# Patient Record
Sex: Female | Born: 1985 | Race: White | Hispanic: No | Marital: Married | Smoking: Never smoker
Health system: Southern US, Community
[De-identification: ages and names within clinical notes are randomized; demographics above are authoritative.]

## PROBLEM LIST (undated history)

## (undated) DIAGNOSIS — R519 Headache, unspecified: Secondary | ICD-10-CM

## (undated) DIAGNOSIS — O99019 Anemia complicating pregnancy, unspecified trimester: Secondary | ICD-10-CM

## (undated) DIAGNOSIS — J45909 Unspecified asthma, uncomplicated: Secondary | ICD-10-CM

## (undated) DIAGNOSIS — O24419 Gestational diabetes mellitus in pregnancy, unspecified control: Secondary | ICD-10-CM

## (undated) DIAGNOSIS — O1490 Unspecified pre-eclampsia, unspecified trimester: Secondary | ICD-10-CM

## (undated) DIAGNOSIS — I10 Essential (primary) hypertension: Secondary | ICD-10-CM

## (undated) DIAGNOSIS — D62 Acute posthemorrhagic anemia: Secondary | ICD-10-CM

## (undated) DIAGNOSIS — D509 Iron deficiency anemia, unspecified: Secondary | ICD-10-CM

## (undated) DIAGNOSIS — K219 Gastro-esophageal reflux disease without esophagitis: Secondary | ICD-10-CM

## (undated) DIAGNOSIS — K909 Intestinal malabsorption, unspecified: Secondary | ICD-10-CM

## (undated) DIAGNOSIS — O9A213 Injury, poisoning and certain other consequences of external causes complicating pregnancy, third trimester: Secondary | ICD-10-CM

## (undated) DIAGNOSIS — Z8619 Personal history of other infectious and parasitic diseases: Secondary | ICD-10-CM

## (undated) DIAGNOSIS — T7840XA Allergy, unspecified, initial encounter: Secondary | ICD-10-CM

## (undated) DIAGNOSIS — R51 Headache: Secondary | ICD-10-CM

## (undated) DIAGNOSIS — N921 Excessive and frequent menstruation with irregular cycle: Secondary | ICD-10-CM

## (undated) HISTORY — DX: Excessive and frequent menstruation with irregular cycle: N92.1

## (undated) HISTORY — DX: Personal history of other infectious and parasitic diseases: Z86.19

## (undated) HISTORY — PX: ADENOIDECTOMY: SUR15

## (undated) HISTORY — DX: Unspecified pre-eclampsia, unspecified trimester: O14.90

## (undated) HISTORY — DX: Gastro-esophageal reflux disease without esophagitis: K21.9

## (undated) HISTORY — PX: KNEE SURGERY: SHX244

## (undated) HISTORY — PX: FRACTURE SURGERY: SHX138

## (undated) HISTORY — PX: CHOLECYSTECTOMY: SHX55

## (undated) HISTORY — DX: Allergy, unspecified, initial encounter: T78.40XA

## (undated) HISTORY — PX: FOOT SURGERY: SHX648

## (undated) HISTORY — PX: TONSILLECTOMY: SUR1361

## (undated) HISTORY — DX: Intestinal malabsorption, unspecified: K90.9

---

## 2014-04-01 DIAGNOSIS — S129XXA Fracture of neck, unspecified, initial encounter: Secondary | ICD-10-CM | POA: Insufficient documentation

## 2014-04-01 DIAGNOSIS — M659 Synovitis and tenosynovitis, unspecified: Secondary | ICD-10-CM | POA: Insufficient documentation

## 2014-04-01 DIAGNOSIS — IMO0002 Reserved for concepts with insufficient information to code with codable children: Secondary | ICD-10-CM | POA: Insufficient documentation

## 2014-06-07 DIAGNOSIS — S96919A Strain of unspecified muscle and tendon at ankle and foot level, unspecified foot, initial encounter: Secondary | ICD-10-CM | POA: Insufficient documentation

## 2014-06-09 DIAGNOSIS — G5790 Unspecified mononeuropathy of unspecified lower limb: Secondary | ICD-10-CM | POA: Insufficient documentation

## 2014-08-01 LAB — OB RESULTS CONSOLE HEPATITIS B SURFACE ANTIGEN: Hepatitis B Surface Ag: NEGATIVE

## 2014-08-01 LAB — OB RESULTS CONSOLE ANTIBODY SCREEN: Antibody Screen: NEGATIVE

## 2014-08-01 LAB — OB RESULTS CONSOLE ABO/RH: RH Type: POSITIVE

## 2014-08-01 LAB — OB RESULTS CONSOLE GC/CHLAMYDIA
Chlamydia: NEGATIVE
Gonorrhea: NEGATIVE

## 2014-08-01 LAB — OB RESULTS CONSOLE RUBELLA ANTIBODY, IGM: Rubella: IMMUNE

## 2014-08-01 LAB — OB RESULTS CONSOLE HIV ANTIBODY (ROUTINE TESTING): HIV: NONREACTIVE

## 2014-08-01 LAB — OB RESULTS CONSOLE RPR: RPR: NONREACTIVE

## 2014-08-11 DIAGNOSIS — Z4889 Encounter for other specified surgical aftercare: Secondary | ICD-10-CM | POA: Insufficient documentation

## 2014-11-12 ENCOUNTER — Inpatient Hospital Stay (HOSPITAL_COMMUNITY): Admission: AD | Admit: 2014-11-12 | Payer: Self-pay | Source: Ambulatory Visit | Admitting: Obstetrics

## 2015-01-04 ENCOUNTER — Ambulatory Visit: Payer: Self-pay

## 2015-02-09 ENCOUNTER — Encounter (HOSPITAL_COMMUNITY): Payer: Self-pay | Admitting: *Deleted

## 2015-02-09 ENCOUNTER — Inpatient Hospital Stay (HOSPITAL_COMMUNITY)
Admission: AD | Admit: 2015-02-09 | Discharge: 2015-02-09 | Disposition: A | Discharge status: Home / Self Care | Payer: BLUE CROSS/BLUE SHIELD | Source: Ambulatory Visit | Attending: Obstetrics & Gynecology | Admitting: Obstetrics & Gynecology

## 2015-02-09 ENCOUNTER — Inpatient Hospital Stay (HOSPITAL_COMMUNITY): Payer: BLUE CROSS/BLUE SHIELD

## 2015-02-09 DIAGNOSIS — M549 Dorsalgia, unspecified: Secondary | ICD-10-CM | POA: Diagnosis not present

## 2015-02-09 DIAGNOSIS — O9989 Other specified diseases and conditions complicating pregnancy, childbirth and the puerperium: Secondary | ICD-10-CM | POA: Insufficient documentation

## 2015-02-09 DIAGNOSIS — R109 Unspecified abdominal pain: Secondary | ICD-10-CM | POA: Insufficient documentation

## 2015-02-09 DIAGNOSIS — R51 Headache: Secondary | ICD-10-CM | POA: Insufficient documentation

## 2015-02-09 DIAGNOSIS — O9A213 Injury, poisoning and certain other consequences of external causes complicating pregnancy, third trimester: Secondary | ICD-10-CM

## 2015-02-09 HISTORY — DX: Injury, poisoning and certain other consequences of external causes complicating pregnancy, third trimester: O9A.213

## 2015-02-09 HISTORY — DX: Gestational diabetes mellitus in pregnancy, unspecified control: O24.419

## 2015-02-09 HISTORY — DX: Unspecified asthma, uncomplicated: J45.909

## 2015-02-09 LAB — COMPREHENSIVE METABOLIC PANEL
ALT: 14 U/L (ref 0–35)
AST: 20 U/L (ref 0–37)
Albumin: 2.7 g/dL — ABNORMAL LOW (ref 3.5–5.2)
Alkaline Phosphatase: 200 U/L — ABNORMAL HIGH (ref 39–117)
Anion gap: 9 (ref 5–15)
BUN: 10 mg/dL (ref 6–23)
CO2: 19 mmol/L (ref 19–32)
Calcium: 9.1 mg/dL (ref 8.4–10.5)
Chloride: 107 mmol/L (ref 96–112)
Creatinine, Ser: 0.54 mg/dL (ref 0.50–1.10)
GFR calc Af Amer: >90 mL/min (ref >90)
GFR calc non Af Amer: >90 mL/min (ref >90)
Glucose, Bld: 90 mg/dL (ref 70–99)
Potassium: 4.1 mmol/L (ref 3.5–5.1)
Sodium: 135 mmol/L (ref 135–145)
Total Bilirubin: 0.3 mg/dL (ref 0.3–1.2)
Total Protein: 6.2 g/dL (ref 6.0–8.3)

## 2015-02-09 LAB — CBC
HCT: 32.7 % — ABNORMAL LOW (ref 36.0–46.0)
Hemoglobin: 10.5 g/dL — ABNORMAL LOW (ref 12.0–15.0)
MCH: 27.9 pg (ref 26.0–34.0)
MCHC: 32.1 g/dL (ref 30.0–36.0)
MCV: 87 fL (ref 78.0–100.0)
Platelets: 271 10*3/uL (ref 150–400)
RBC: 3.76 MIL/uL — ABNORMAL LOW (ref 3.87–5.11)
RDW: 14.5 % (ref 11.5–15.5)
WBC: 13.3 10*3/uL — ABNORMAL HIGH (ref 4.0–10.5)

## 2015-02-09 LAB — PROTEIN / CREATININE RATIO, URINE
Creatinine, Urine: 22 mg/dL
Protein Creatinine Ratio: 1.82 — ABNORMAL HIGH (ref 0.00–0.15)
Total Protein, Urine: 40 mg/dL

## 2015-02-09 LAB — KLEIHAUER-BETKE STAIN
# Vials RhIg: 1
Fetal Cells %: 0 %
Quantitation Fetal Hemoglobin: 0 mL

## 2015-02-09 LAB — URIC ACID: Uric Acid, Serum: 4.4 mg/dL (ref 2.4–7.0)

## 2015-02-09 MED ORDER — ACETAMINOPHEN 500 MG PO TABS: 1000.0000 mg | ORAL_TABLET | Freq: Once | ORAL | Status: DC
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
## 2015-02-09 NOTE — Progress Notes (Signed)
Pt states she has a headache for about 1 hour that she rates 3-4
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
## 2015-02-09 NOTE — MAU Note (Signed)
Urine in lab 
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
## 2015-02-13 ENCOUNTER — Encounter (HOSPITAL_COMMUNITY): Payer: Self-pay | Admitting: *Deleted

## 2015-02-13 ENCOUNTER — Inpatient Hospital Stay (HOSPITAL_COMMUNITY)
Admission: AD | Admit: 2015-02-13 | Discharge: 2015-02-13 | Disposition: A | Discharge status: Home / Self Care | Payer: BLUE CROSS/BLUE SHIELD | Source: Ambulatory Visit | Attending: Obstetrics & Gynecology | Admitting: Obstetrics & Gynecology

## 2015-02-13 DIAGNOSIS — O24419 Gestational diabetes mellitus in pregnancy, unspecified control: Secondary | ICD-10-CM | POA: Diagnosis not present

## 2015-02-13 DIAGNOSIS — O133 Gestational [pregnancy-induced] hypertension without significant proteinuria, third trimester: Secondary | ICD-10-CM | POA: Insufficient documentation

## 2015-02-13 DIAGNOSIS — Z3A36 36 weeks gestation of pregnancy: Secondary | ICD-10-CM | POA: Diagnosis not present

## 2015-02-13 LAB — URINALYSIS, ROUTINE W REFLEX MICROSCOPIC
Bilirubin Urine: NEGATIVE
Glucose, UA: NEGATIVE mg/dL
Hgb urine dipstick: NEGATIVE
Ketones, ur: NEGATIVE mg/dL
Leukocytes, UA: NEGATIVE
Nitrite: NEGATIVE
Protein, ur: 30 mg/dL — AB
Specific Gravity, Urine: 1.015 (ref 1.005–1.030)
Urobilinogen, UA: 0.2 mg/dL (ref 0.0–1.0)
pH: 6 (ref 5.0–8.0)

## 2015-02-13 LAB — PROTEIN / CREATININE RATIO, URINE
Creatinine, Urine: 95 mg/dL
Protein Creatinine Ratio: 1.2 — ABNORMAL HIGH (ref 0.00–0.15)
Total Protein, Urine: 114 mg/dL

## 2015-02-13 LAB — COMPREHENSIVE METABOLIC PANEL
ALT: 18 U/L (ref 0–35)
AST: 21 U/L (ref 0–37)
Albumin: 2.9 g/dL — ABNORMAL LOW (ref 3.5–5.2)
Alkaline Phosphatase: 200 U/L — ABNORMAL HIGH (ref 39–117)
Anion gap: 10 (ref 5–15)
BUN: 10 mg/dL (ref 6–23)
CO2: 19 mmol/L (ref 19–32)
Calcium: 9 mg/dL (ref 8.4–10.5)
Chloride: 104 mmol/L (ref 96–112)
Creatinine, Ser: 0.63 mg/dL (ref 0.50–1.10)
GFR calc Af Amer: >90 mL/min (ref >90)
GFR calc non Af Amer: >90 mL/min (ref >90)
Glucose, Bld: 88 mg/dL (ref 70–99)
Potassium: 4.1 mmol/L (ref 3.5–5.1)
Sodium: 133 mmol/L — ABNORMAL LOW (ref 135–145)
Total Bilirubin: 0.3 mg/dL (ref 0.3–1.2)
Total Protein: 6.8 g/dL (ref 6.0–8.3)

## 2015-02-13 LAB — URIC ACID: Uric Acid, Serum: 5 mg/dL (ref 2.4–7.0)

## 2015-02-13 LAB — CBC
HCT: 31.8 % — ABNORMAL LOW (ref 36.0–46.0)
Hemoglobin: 10.5 g/dL — ABNORMAL LOW (ref 12.0–15.0)
MCH: 28.2 pg (ref 26.0–34.0)
MCHC: 33 g/dL (ref 30.0–36.0)
MCV: 85.5 fL (ref 78.0–100.0)
Platelets: 265 10*3/uL (ref 150–400)
RBC: 3.72 MIL/uL — ABNORMAL LOW (ref 3.87–5.11)
RDW: 14.6 % (ref 11.5–15.5)
WBC: 12.4 10*3/uL — ABNORMAL HIGH (ref 4.0–10.5)

## 2015-02-13 LAB — URINE MICROSCOPIC-ADD ON

## 2015-02-13 LAB — OB RESULTS CONSOLE GBS: GBS: NEGATIVE

## 2015-02-13 MED ORDER — NIFEDIPINE ER OSMOTIC RELEASE 30 MG PO TB24
30.0000 mg | ORAL_TABLET | Freq: Every day | ORAL | Status: DC
  Administered 2015-02-13: 30 mg via ORAL
  Filled 2015-02-13: qty 1

## 2015-02-13 MED ORDER — NIFEDIPINE ER 30 MG PO TB24: 30.0000 mg | ORAL_TABLET | Freq: Every day | ORAL | Status: DC

## 2015-02-13 NOTE — MAU Note (Signed)
Sent from MD office for elevated BP, 5 lb weight gain in last week.  Has HA, seeing stars @ times, denies epigastric pain.  Has had nausea her entire pregnancy.  Has some uc's, denies bleeding or LOF.

## 2015-02-13 NOTE — Discharge Instructions (Signed)
Hypertension During Pregnancy Hypertension is also called high blood pressure. Blood pressure moves blood in your body. Sometimes, the force that moves the blood becomes too strong. When you are pregnant, this condition should be watched carefully. It can cause problems for you and your baby. HOME CARE   Make and keep all of your doctor visits.  Take medicine as told by your doctor.   Eat very little salt.  Exercise regularly.  Do not take Sudafed products  Do not smoke.  Do not have drinks with caffeine.  Lie on your side when resting. GET HELP RIGHT AWAY IF:  You have bad belly (abdominal) pain.  You have sudden puffiness (swelling) in the hands, ankles, or face.  You gain 4 pounds (1.8 kilograms) or more in 1 week.  You throw up (vomit) repeatedly.  You have bleeding from the vagina.  You do not feel the baby moving as much.  You have a headache.  You have blurred or double vision.  You have muscle twitching or spasms.  You have shortness of breath.  You have blue fingernails and lips.  You have blood in your pee (urine). MAKE SURE YOU:  Understand these instructions.  Will watch your condition.  Will get help right away if you are not doing well or get worse. Document Released: 11/16/2010 Document Revised: 02/28/2014 Document Reviewed: 05/13/2013 [facility] Patient Information 2015 [place], [place]. This information is not intended to replace advice given to you by your health care provider. Make sure you discuss any questions you have with your health care provider.
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
## 2015-02-22 ENCOUNTER — Encounter (HOSPITAL_COMMUNITY): Payer: Self-pay | Admitting: *Deleted

## 2015-02-22 ENCOUNTER — Telehealth (HOSPITAL_COMMUNITY): Payer: Self-pay | Admitting: *Deleted

## 2015-02-22 NOTE — Telephone Encounter (Signed)
Preadmission screen  

## 2015-02-23 ENCOUNTER — Encounter (HOSPITAL_COMMUNITY): Payer: Self-pay

## 2015-02-23 ENCOUNTER — Inpatient Hospital Stay (HOSPITAL_COMMUNITY)
Admission: AD | Admit: 2015-02-23 | Discharge: 2015-03-02 | DRG: 765 | Disposition: A | Discharge status: Home / Self Care | Payer: BLUE CROSS/BLUE SHIELD | Source: Ambulatory Visit | Attending: Obstetrics & Gynecology | Admitting: Obstetrics & Gynecology

## 2015-02-23 DIAGNOSIS — Y92239 Unspecified place in hospital as the place of occurrence of the external cause: Secondary | ICD-10-CM | POA: Diagnosis not present

## 2015-02-23 DIAGNOSIS — Z3A37 37 weeks gestation of pregnancy: Secondary | ICD-10-CM | POA: Diagnosis present

## 2015-02-23 DIAGNOSIS — O1493 Unspecified pre-eclampsia, third trimester: Secondary | ICD-10-CM | POA: Diagnosis present

## 2015-02-23 DIAGNOSIS — J45909 Unspecified asthma, uncomplicated: Secondary | ICD-10-CM | POA: Diagnosis present

## 2015-02-23 DIAGNOSIS — O9952 Diseases of the respiratory system complicating childbirth: Secondary | ICD-10-CM | POA: Diagnosis present

## 2015-02-23 DIAGNOSIS — O9081 Anemia of the puerperium: Secondary | ICD-10-CM | POA: Diagnosis not present

## 2015-02-23 DIAGNOSIS — D62 Acute posthemorrhagic anemia: Secondary | ICD-10-CM | POA: Diagnosis not present

## 2015-02-23 DIAGNOSIS — K219 Gastro-esophageal reflux disease without esophagitis: Secondary | ICD-10-CM | POA: Diagnosis present

## 2015-02-23 DIAGNOSIS — I952 Hypotension due to drugs: Secondary | ICD-10-CM | POA: Diagnosis not present

## 2015-02-23 DIAGNOSIS — Z885 Allergy status to narcotic agent status: Secondary | ICD-10-CM | POA: Diagnosis not present

## 2015-02-23 DIAGNOSIS — Z7951 Long term (current) use of inhaled steroids: Secondary | ICD-10-CM

## 2015-02-23 DIAGNOSIS — Z79899 Other long term (current) drug therapy: Secondary | ICD-10-CM | POA: Diagnosis not present

## 2015-02-23 DIAGNOSIS — Z880 Allergy status to penicillin: Secondary | ICD-10-CM

## 2015-02-23 DIAGNOSIS — T413X5A Adverse effect of local anesthetics, initial encounter: Secondary | ICD-10-CM | POA: Diagnosis not present

## 2015-02-23 DIAGNOSIS — O1490 Unspecified pre-eclampsia, unspecified trimester: Secondary | ICD-10-CM | POA: Diagnosis present

## 2015-02-23 DIAGNOSIS — O9A213 Injury, poisoning and certain other consequences of external causes complicating pregnancy, third trimester: Secondary | ICD-10-CM

## 2015-02-23 DIAGNOSIS — O24420 Gestational diabetes mellitus in childbirth, diet controlled: Secondary | ICD-10-CM | POA: Diagnosis present

## 2015-02-23 DIAGNOSIS — O9962 Diseases of the digestive system complicating childbirth: Secondary | ICD-10-CM | POA: Diagnosis present

## 2015-02-23 DIAGNOSIS — O1404 Mild to moderate pre-eclampsia, complicating childbirth: Secondary | ICD-10-CM | POA: Diagnosis present

## 2015-02-23 HISTORY — DX: Headache: R51

## 2015-02-23 HISTORY — DX: Headache, unspecified: R51.9

## 2015-02-23 HISTORY — DX: Essential (primary) hypertension: I10

## 2015-02-23 LAB — CBC
HCT: 31.3 % — ABNORMAL LOW (ref 36.0–46.0)
Hemoglobin: 10.2 g/dL — ABNORMAL LOW (ref 12.0–15.0)
MCH: 27.6 pg (ref 26.0–34.0)
MCHC: 32.6 g/dL (ref 30.0–36.0)
MCV: 84.6 fL (ref 78.0–100.0)
Platelets: 269 10*3/uL (ref 150–400)
RBC: 3.7 MIL/uL — ABNORMAL LOW (ref 3.87–5.11)
RDW: 15.1 % (ref 11.5–15.5)
WBC: 12.4 10*3/uL — ABNORMAL HIGH (ref 4.0–10.5)

## 2015-02-23 LAB — PROTEIN / CREATININE RATIO, URINE
Creatinine, Urine: 102 mg/dL
Protein Creatinine Ratio: 1.7 mg/mg{Cre} — ABNORMAL HIGH (ref 0.00–0.15)
Total Protein, Urine: 173 mg/dL

## 2015-02-23 LAB — COMPREHENSIVE METABOLIC PANEL
ALT: 14 U/L (ref 0–35)
AST: 21 U/L (ref 0–37)
Albumin: 2.9 g/dL — ABNORMAL LOW (ref 3.5–5.2)
Alkaline Phosphatase: 210 U/L — ABNORMAL HIGH (ref 39–117)
Anion gap: 8 (ref 5–15)
BUN: 12 mg/dL (ref 6–23)
CO2: 19 mmol/L (ref 19–32)
Calcium: 9 mg/dL (ref 8.4–10.5)
Chloride: 107 mmol/L (ref 96–112)
Creatinine, Ser: 0.73 mg/dL (ref 0.50–1.10)
GFR calc Af Amer: >90 mL/min (ref >90)
GFR calc non Af Amer: >90 mL/min (ref >90)
Glucose, Bld: 110 mg/dL — ABNORMAL HIGH (ref 70–99)
Potassium: 4.1 mmol/L (ref 3.5–5.1)
Sodium: 134 mmol/L — ABNORMAL LOW (ref 135–145)
Total Bilirubin: 0.2 mg/dL — ABNORMAL LOW (ref 0.3–1.2)
Total Protein: 6.6 g/dL (ref 6.0–8.3)

## 2015-02-23 LAB — URINALYSIS, ROUTINE W REFLEX MICROSCOPIC
Bilirubin Urine: NEGATIVE
Glucose, UA: NEGATIVE mg/dL
Ketones, ur: NEGATIVE mg/dL
Leukocytes, UA: NEGATIVE
Nitrite: NEGATIVE
Protein, ur: 100 mg/dL — AB
Specific Gravity, Urine: 1.02 (ref 1.005–1.030)
Urobilinogen, UA: 0.2 mg/dL (ref 0.0–1.0)
pH: 6 (ref 5.0–8.0)

## 2015-02-23 LAB — URIC ACID: Uric Acid, Serum: 4.7 mg/dL (ref 2.4–7.0)

## 2015-02-23 LAB — URINE MICROSCOPIC-ADD ON: WBC, UA: NONE SEEN WBC/hpf (ref <3)

## 2015-02-23 MED ORDER — OXYTOCIN BOLUS FROM INFUSION: 500.0000 mL | INTRAVENOUS | Status: DC

## 2015-02-23 MED ORDER — OXYCODONE-ACETAMINOPHEN 5-325 MG PO TABS: 1.0000 | ORAL_TABLET | ORAL | Status: DC | PRN

## 2015-02-23 MED ORDER — BUDESONIDE 180 MCG/ACT IN AEPB
2.0000 | INHALATION_SPRAY | Freq: Two times a day (BID) | RESPIRATORY_TRACT | Status: DC
  Administered 2015-02-25: 4 via RESPIRATORY_TRACT

## 2015-02-23 MED ORDER — MAGNESIUM SULFATE 50 % IJ SOLN
2.0000 g/h | INTRAVENOUS | Status: DC
  Administered 2015-02-24 – 2015-02-26 (×3): 2 g/h via INTRAVENOUS
  Filled 2015-02-23 (×4): qty 80

## 2015-02-23 MED ORDER — OXYTOCIN 40 UNITS IN LACTATED RINGERS INFUSION - SIMPLE MED: 62.5000 mL/h | INTRAVENOUS | Status: DC

## 2015-02-23 MED ORDER — LIDOCAINE HCL (PF) 1 % IJ SOLN: 30.0000 mL | INTRAMUSCULAR | Status: DC | PRN

## 2015-02-23 MED ORDER — PANTOPRAZOLE SODIUM 20 MG PO TBEC
20.0000 mg | DELAYED_RELEASE_TABLET | Freq: Every day | ORAL | Status: DC
  Administered 2015-02-24: 20 mg via ORAL
  Filled 2015-02-23 (×3): qty 1

## 2015-02-23 MED ORDER — ACETAMINOPHEN 325 MG PO TABS
650.0000 mg | ORAL_TABLET | ORAL | Status: DC | PRN
  Administered 2015-02-24 (×2): 650 mg via ORAL
  Filled 2015-02-23 (×2): qty 2

## 2015-02-23 MED ORDER — FLUTICASONE PROPIONATE HFA 44 MCG/ACT IN AERO
2.0000 | INHALATION_SPRAY | Freq: Two times a day (BID) | RESPIRATORY_TRACT | Status: DC
  Filled 2015-02-23: qty 10.6

## 2015-02-23 MED ORDER — TERBUTALINE SULFATE 1 MG/ML IJ SOLN: 0.2500 mg | Freq: Once | INTRAMUSCULAR | Status: AC | PRN

## 2015-02-23 MED ORDER — LACTATED RINGERS IV SOLN
INTRAVENOUS | Status: DC
  Administered 2015-02-23 – 2015-02-25 (×4): via INTRAVENOUS

## 2015-02-23 MED ORDER — OXYTOCIN 10 UNIT/ML IJ SOLN: 10.0000 [IU] | Freq: Once | INTRAMUSCULAR | Status: AC | PRN

## 2015-02-23 MED ORDER — ZOLPIDEM TARTRATE 5 MG PO TABS
5.0000 mg | ORAL_TABLET | Freq: Every evening | ORAL | Status: DC | PRN
  Administered 2015-02-24: 5 mg via ORAL
  Filled 2015-02-23: qty 1

## 2015-02-23 MED ORDER — LACTATED RINGERS IV SOLN
500.0000 mL | INTRAVENOUS | Status: DC | PRN
  Administered 2015-02-24 – 2015-02-25 (×2): 1000 mL via INTRAVENOUS

## 2015-02-23 MED ORDER — LABETALOL HCL 100 MG PO TABS
100.0000 mg | ORAL_TABLET | Freq: Two times a day (BID) | ORAL | Status: DC
  Administered 2015-02-23 – 2015-02-24 (×2): 100 mg via ORAL
  Filled 2015-02-23 (×6): qty 1

## 2015-02-23 MED ORDER — MAGNESIUM SULFATE BOLUS VIA INFUSION
4.0000 g | Freq: Once | INTRAVENOUS | Status: AC
  Administered 2015-02-23: 4 g via INTRAVENOUS
  Filled 2015-02-23: qty 500

## 2015-02-23 MED ORDER — MISOPROSTOL 25 MCG QUARTER TABLET
25.0000 ug | ORAL_TABLET | ORAL | Status: DC | PRN
Start: 2015-02-23 — End: 2015-02-25
  Administered 2015-02-23 – 2015-02-24 (×2): 25 ug via VAGINAL
  Filled 2015-02-23 (×2): qty 0.25

## 2015-02-23 MED ORDER — OXYCODONE-ACETAMINOPHEN 5-325 MG PO TABS: 2.0000 | ORAL_TABLET | ORAL | Status: DC | PRN

## 2015-02-23 MED ORDER — CITRIC ACID-SODIUM CITRATE 334-500 MG/5ML PO SOLN
30.0000 mL | ORAL | Status: DC | PRN
  Administered 2015-02-25: 30 mL via ORAL
  Filled 2015-02-23 (×2): qty 15
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
## 2015-02-23 NOTE — MAU Note (Signed)
Patient called to lab directly from being registered.

## 2015-02-23 NOTE — MAU Note (Signed)
Pt here to evaluate BP, have blood work and urine collected. Denies any bleeding or leaking of fluid

## 2015-02-24 ENCOUNTER — Inpatient Hospital Stay (HOSPITAL_COMMUNITY): Payer: BLUE CROSS/BLUE SHIELD | Admitting: Anesthesiology

## 2015-02-24 ENCOUNTER — Encounter (HOSPITAL_COMMUNITY): Payer: Self-pay | Admitting: *Deleted

## 2015-02-24 LAB — GLUCOSE, CAPILLARY
Glucose-Capillary: 109 mg/dL — ABNORMAL HIGH (ref 70–99)
Glucose-Capillary: 112 mg/dL — ABNORMAL HIGH (ref 70–99)
Glucose-Capillary: 113 mg/dL — ABNORMAL HIGH (ref 70–99)
Glucose-Capillary: 127 mg/dL — ABNORMAL HIGH (ref 70–99)
Glucose-Capillary: 94 mg/dL (ref 70–99)
Glucose-Capillary: 95 mg/dL (ref 70–99)

## 2015-02-24 LAB — CBC
HCT: 31 % — ABNORMAL LOW (ref 36.0–46.0)
HCT: 32.4 % — ABNORMAL LOW (ref 36.0–46.0)
Hemoglobin: 10 g/dL — ABNORMAL LOW (ref 12.0–15.0)
Hemoglobin: 10.7 g/dL — ABNORMAL LOW (ref 12.0–15.0)
MCH: 27 pg (ref 26.0–34.0)
MCH: 27.8 pg (ref 26.0–34.0)
MCHC: 32.3 g/dL (ref 30.0–36.0)
MCHC: 33 g/dL (ref 30.0–36.0)
MCV: 83.8 fL (ref 78.0–100.0)
MCV: 84.2 fL (ref 78.0–100.0)
Platelets: 254 10*3/uL (ref 150–400)
Platelets: 265 10*3/uL (ref 150–400)
RBC: 3.7 MIL/uL — ABNORMAL LOW (ref 3.87–5.11)
RBC: 3.85 MIL/uL — ABNORMAL LOW (ref 3.87–5.11)
RDW: 15.1 % (ref 11.5–15.5)
RDW: 15.2 % (ref 11.5–15.5)
WBC: 12.5 10*3/uL — ABNORMAL HIGH (ref 4.0–10.5)
WBC: 16.7 10*3/uL — ABNORMAL HIGH (ref 4.0–10.5)

## 2015-02-24 LAB — TYPE AND SCREEN
ABO/RH(D): O POS
Antibody Screen: NEGATIVE

## 2015-02-24 LAB — ABO/RH: ABO/RH(D): O POS

## 2015-02-24 LAB — MAGNESIUM
Magnesium: 4.2 mg/dL — ABNORMAL HIGH (ref 1.5–2.5)
Magnesium: 4.4 mg/dL — ABNORMAL HIGH (ref 1.5–2.5)

## 2015-02-24 LAB — RPR: RPR Ser Ql: NONREACTIVE

## 2015-02-24 MED ORDER — BUPIVACAINE HCL (PF) 0.25 % IJ SOLN
INTRAMUSCULAR | Status: DC | PRN
  Administered 2015-02-24 (×2): 4 mL via EPIDURAL

## 2015-02-24 MED ORDER — PHENYLEPHRINE 40 MCG/ML (10ML) SYRINGE FOR IV PUSH (FOR BLOOD PRESSURE SUPPORT)
PREFILLED_SYRINGE | INTRAVENOUS | Status: AC
  Administered 2015-02-24: 80 ug
  Filled 2015-02-24: qty 20

## 2015-02-24 MED ORDER — DIPHENHYDRAMINE HCL 50 MG/ML IJ SOLN: 12.5000 mg | INTRAMUSCULAR | Status: DC | PRN

## 2015-02-24 MED ORDER — OXYTOCIN 40 UNITS IN LACTATED RINGERS INFUSION - SIMPLE MED
1.0000 m[IU]/min | INTRAVENOUS | Status: DC
  Administered 2015-02-24: 2 m[IU]/min via INTRAVENOUS
  Administered 2015-02-25: 30 m[IU]/min via INTRAVENOUS
  Administered 2015-02-25: 22 m[IU]/min via INTRAVENOUS
  Filled 2015-02-24 (×2): qty 1000

## 2015-02-24 MED ORDER — EPHEDRINE 5 MG/ML INJ
10.0000 mg | INTRAVENOUS | Status: AC | PRN
  Administered 2015-02-24 (×3): 10 mg via INTRAVENOUS
  Filled 2015-02-24: qty 4

## 2015-02-24 MED ORDER — LIDOCAINE-EPINEPHRINE (PF) 2 %-1:200000 IJ SOLN
INTRAMUSCULAR | Status: DC | PRN
  Administered 2015-02-24: 3 mL

## 2015-02-24 MED ORDER — EPHEDRINE 5 MG/ML INJ
INTRAVENOUS | Status: AC
  Administered 2015-02-24: 10 mg
  Filled 2015-02-24: qty 8

## 2015-02-24 MED ORDER — BUTORPHANOL TARTRATE 1 MG/ML IJ SOLN
1.0000 mg | Freq: Once | INTRAMUSCULAR | Status: AC
Start: 2015-02-24 — End: 2015-02-24
  Administered 2015-02-24: 1 mg via INTRAVENOUS
  Filled 2015-02-24: qty 1

## 2015-02-24 MED ORDER — FENTANYL 2.5 MCG/ML BUPIVACAINE 1/10 % EPIDURAL INFUSION (WH - ANES)
14.0000 mL/h | INTRAMUSCULAR | Status: DC | PRN
  Administered 2015-02-24: 12 mL/h via EPIDURAL
  Administered 2015-02-25 (×2): 14 mL/h via EPIDURAL
  Filled 2015-02-24 (×4): qty 125

## 2015-02-24 MED ORDER — PHENYLEPHRINE 40 MCG/ML (10ML) SYRINGE FOR IV PUSH (FOR BLOOD PRESSURE SUPPORT)
80.0000 ug | PREFILLED_SYRINGE | INTRAVENOUS | Status: AC | PRN
  Administered 2015-02-24 (×3): 80 ug via INTRAVENOUS
  Filled 2015-02-24: qty 20

## 2015-02-24 NOTE — Progress Notes (Signed)
Subjective:   Some cramping, HA improved after Stadol. No visual disturbances or epigastric pain.  Objective:   VS: Blood pressure 110/71, pulse 78, temperature 98.5 F (36.9 C), temperature source Oral, resp. rate 20, height 5\' 3"  (1.6 m), weight 94.348 kg (208 lb), SpO2 98 %. Heart: RRR Lungs: CTA bilat Extremities: 1+ BLE edema, DTRs 2+, no clonus FHR: baseline 120 / variability mod / accelerations + / no decelerations Toco: contractions every 3-5 minutes Cervix: deferred Cervical balloon in place-traction placed and re-taped to leg Membranes: intact Pitocin: 2 mu/min Magnesium Sulfate: 2gm/hr  Assessment:  37.[redacted] weeks gestation PEC Labor: latent FHR category I  Plan:  Bps stabile, continue cervical ripening, analgesia/anesthesia prn, continue Labetalol and Mag Sulfate.     Donette LarryBHAMBRI, , N MSN, CNM 02/24/2015, 9:30 AM

## 2015-02-24 NOTE — Anesthesia Preprocedure Evaluation (Addendum)
Anesthesia Evaluation  Patient identified by MRN, date of birth, ID band Patient awake    Reviewed: Allergy & Precautions, NPO status , Patient's Chart, lab work & pertinent test results  History of Anesthesia Complications Negative for: history of anesthetic complications  Airway Mallampati: III  TM Distance: >3 FB Neck ROM: Full    Dental  (+) Teeth Intact   Pulmonary asthma ,  breath sounds clear to auscultation        Cardiovascular hypertension, Pt. on medications Rhythm:Regular     Neuro/Psych  Headaches,    GI/Hepatic negative GI ROS, Neg liver ROS,   Endo/Other  diabetesMorbid obesity  Renal/GU      Musculoskeletal   Abdominal   Peds  Hematology  (+) anemia ,   Anesthesia Other Findings   Reproductive/Obstetrics (+) Pregnancy                            Anesthesia Physical Anesthesia Plan  ASA: III  Anesthesia Plan: Epidural   Post-op Pain Management:    Induction:   Airway Management Planned:   Additional Equipment:   Intra-op Plan:   Post-operative Plan:   Informed Consent: I have reviewed the patients History and Physical, chart, labs and discussed the procedure including the risks, benefits and alternatives for the proposed anesthesia with the patient or authorized representative who has indicated his/her understanding and acceptance.     Plan Discussed with: CRNA and Surgeon  Anesthesia Plan Comments: (Going for C/S. Has had some breakthrough pain that was responsive to epidural bolusing.)       Anesthesia Quick Evaluation

## 2015-02-24 NOTE — Progress Notes (Signed)
Subjective:   Some cramping, some ctx uncomfortable, declines pain meds  Objective:   VS: Blood pressure 144/87, pulse 86, temperature 98.5 F (36.9 C), temperature source Oral, resp. rate 18, height 5\' 3"  (1.6 m), weight 94.348 kg (208 lb), SpO2 98 %.  BS: 127  FHR: baseline 120 / variability mod / accelerations + / no decelerations Toco: contractions every 2-6 minutes Cervix: 5/70/-2, cervical balloon out Membranes: intact Pitocin: 8 mu/min  Assessment:  Labor: latent FHR category I PEC-BPs stable A1GDM-stable  Plan:  Continue Pitocin and Mag Sulfate, BS checks q4, analgesia/anesthesia prn, consider AROM if no active labor in 3-4 hrs, anticipate SVD.      Donette LarryBHAMBRI, , N MSN, CNM 02/24/2015, 12:16 PM

## 2015-02-24 NOTE — Anesthesia Procedure Notes (Signed)
Epidural Patient location during procedure: OB  Staffing Anesthesiologist: , CHRIS Performed by: anesthesiologist   Preanesthetic Checklist Completed: patient identified, surgical consent, pre-op evaluation, timeout performed, IV checked, risks and benefits discussed and monitors and equipment checked  Epidural Patient position: sitting Prep: site prepped and draped and DuraPrep Patient monitoring: heart rate, cardiac monitor, continuous pulse ox and blood pressure Approach: midline Location: L3-L4 Injection technique: LOR saline  Needle:  Needle type: Tuohy  Needle gauge: 17 G Needle length: 9 cm Needle insertion depth: 8 cm Catheter type: closed end flexible Catheter size: 19 Gauge Catheter at skin depth: 13 cm Test dose: negative and 2% lidocaine with Epi 1:200 K  Assessment Events: blood not aspirated, injection not painful, no injection resistance, negative IV test and no paresthesia  Additional Notes H+P and labs checked, risks and benefits discussed with the patient, consent obtained, procedure tolerated well and without complications.  Reason for block:procedure for pain   

## 2015-02-24 NOTE — Progress Notes (Signed)
Subjective:   Called by RN d/t hypotension after epidural. Pt feeling nausea and "not well", vomited x1. Left lateral w/peanut ball. Denies pain.  Objective:   VS: BP range: 70-100/30-50 FHR: baseline 150 / variability minimal / accelerations no / late decelerations Toco: contractions every 4-5 minutes Cervix: Dilation: 5 Effacement (%): 70 Station: -3 Exam by:: Fabian NovemberM , CNM Membranes: clear Pitocin: 20 mu/min Mag Sulfate 2gm/hr  Assessment:  Labor: latent Post epidural hypotension FHR category II then progression to Cat I PEC  Plan:  MgSO4 and Pitocin d/c'd, IVF bolus and multiple doses of Ephedrine and Neosynephrine, o2 by NRB, reduction of epidural rate by anesthesia-BP and fetal tracing improved, pt feeling slightly better. IUPC placed. Restart Pitocin after 1 hr if tracing reassuring. Restart Mag Sulfate in 2 hrs if BP stable.  Dr. Juliene PinaMody updated with A/P.    Donette LarryBHAMBRI, , N MSN, CNM 02/24/2015, 8:50 PM

## 2015-02-24 NOTE — Progress Notes (Signed)
On call MD Induction for preeclampsia S: c/o headache Denies blurry vision S/p cytotec x 2  O: Blood pressure 127/71, pulse 86, temperature 98.5 F (36.9 C), temperature source Oral, resp. rate 18, height 5\' 3"  (1.6 m), weight 94.348 kg (208 lb), SpO2 98 %.  VE ft/80/-3 very soft LUS. Intracervical balloon placed  tracing: baseline 140"s (+) accel 155 irreg ctx  CBC Latest Ref Rng 02/23/2015 02/13/2015 02/09/2015  WBC 4.0 - 10.5 K/uL 12.4(H) 12.4(H) 13.3(H)  Hemoglobin 12.0 - 15.0 g/dL 10.2(L) 10.5(L) 10.5(L)  Hematocrit 36.0 - 46.0 % 31.3(L) 31.8(L) 32.7(L)  Platelets 150 - 400 K/uL 269 265 271   CMP Latest Ref Rng 02/23/2015 02/13/2015 02/09/2015  Glucose 70 - 99 mg/dL 098(J110(H) 88 90  BUN 6 - 23 mg/dL 12 10 10   Creatinine 0.50 - 1.10 mg/dL 1.910.73 4.780.63 2.950.54  Sodium 135 - 145 mmol/L 134(L) 133(L) 135  Potassium 3.5 - 5.1 mmol/L 4.1 4.1 4.1  Chloride 96 - 112 mmol/L 107 104 107  CO2 19 - 32 mmol/L 19 19 19   Calcium 8.4 - 10.5 mg/dL 9.0 9.0 9.1  Total Protein 6.0 - 8.3 g/dL 6.6 6.8 6.2  Total Bilirubin 0.3 - 1.2 mg/dL 6.2(Z0.2(L) 0.3 0.3  Alkaline Phos 39 - 117 U/L 210(H) 200(H) 200(H)  AST 0 - 37 U/L 21 21 20   ALT 0 - 35 U/L 14 18 14    CBG 112 IMP: Preclampsia On magnesium. Labetalol IUP @ 37 6/7 weeks P) pitocin after 7 am. Repeat CBC, mg level now. May have a light labor meal. Reviewed plan with pt and rolitta, CNM

## 2015-02-24 NOTE — Progress Notes (Signed)
Subjective:   Some ctx uncomfortable, declines need for pain med. Right lateral.  Objective:   VS: Blood pressure 148/88, pulse 114, temperature 98.5 F (36.9 C), temperature source Oral, resp. rate 18, height 5\' 3"  (1.6 m), weight 94.348 kg (208 lb), SpO2 98 %. I/O: adequate BS: 112 FHR: baseline 125 / variability mod / accelerations + / no decelerations Toco: contractions every 3-4 minutes Cervix: Dilation: 5 Effacement (%): 70 Station: -3 Exam by:: Fabian NovemberM , CNM Membranes: AROM-clear, pink tinged, scant Pitocin: 18 mu/min  Assessment:  Labor: latent PEC-stable A1GDM-stable FHR category I  Plan:  Continue Pitocin titration, peanut ball to facilitate rotation and descent, analgesia prn. Dr. Juliene PinaMody updated with A/P.      Donette LarryBHAMBRI, , N MSN, CNM 02/24/2015, 5:10 PM

## 2015-02-25 ENCOUNTER — Encounter (HOSPITAL_COMMUNITY)
Admission: AD | Disposition: A | Discharge status: Home / Self Care | Payer: Self-pay | Source: Ambulatory Visit | Attending: Obstetrics & Gynecology

## 2015-02-25 ENCOUNTER — Inpatient Hospital Stay (HOSPITAL_COMMUNITY): Admission: RE | Admit: 2015-02-25 | Payer: No Typology Code available for payment source | Source: Ambulatory Visit

## 2015-02-25 ENCOUNTER — Encounter (HOSPITAL_COMMUNITY): Payer: Self-pay | Admitting: Registered Nurse

## 2015-02-25 DIAGNOSIS — O1404 Mild to moderate pre-eclampsia, complicating childbirth: Secondary | ICD-10-CM | POA: Diagnosis present

## 2015-02-25 LAB — GLUCOSE, CAPILLARY
Glucose-Capillary: 107 mg/dL — ABNORMAL HIGH (ref 70–99)
Glucose-Capillary: 143 mg/dL — ABNORMAL HIGH (ref 70–99)
Glucose-Capillary: 124 mg/dL — ABNORMAL HIGH (ref 70–99)
Glucose-Capillary: 126 mg/dL — ABNORMAL HIGH (ref 70–99)
Glucose-Capillary: 122 mg/dL — ABNORMAL HIGH (ref 70–99)
Glucose-Capillary: 113 mg/dL — ABNORMAL HIGH (ref 70–99)

## 2015-02-25 LAB — COMPREHENSIVE METABOLIC PANEL
ALT: 15 U/L (ref 0–35)
AST: 22 U/L (ref 0–37)
Albumin: 2.6 g/dL — ABNORMAL LOW (ref 3.5–5.2)
Alkaline Phosphatase: 205 U/L — ABNORMAL HIGH (ref 39–117)
Anion gap: 11 (ref 5–15)
BUN: 9 mg/dL (ref 6–23)
CO2: 19 mmol/L (ref 19–32)
Calcium: 7.5 mg/dL — ABNORMAL LOW (ref 8.4–10.5)
Chloride: 102 mmol/L (ref 96–112)
Creatinine, Ser: 0.93 mg/dL (ref 0.50–1.10)
GFR calc Af Amer: >90 mL/min (ref >90)
GFR calc non Af Amer: 82 mL/min — ABNORMAL LOW (ref >90)
Glucose, Bld: 132 mg/dL — ABNORMAL HIGH (ref 70–99)
Potassium: 4.2 mmol/L (ref 3.5–5.1)
Sodium: 132 mmol/L — ABNORMAL LOW (ref 135–145)
Total Bilirubin: 0.5 mg/dL (ref 0.3–1.2)
Total Protein: 6.1 g/dL (ref 6.0–8.3)

## 2015-02-25 LAB — CBC
HCT: 29.8 % — ABNORMAL LOW (ref 36.0–46.0)
Hemoglobin: 9.6 g/dL — ABNORMAL LOW (ref 12.0–15.0)
MCH: 27.4 pg (ref 26.0–34.0)
MCHC: 32.2 g/dL (ref 30.0–36.0)
MCV: 84.9 fL (ref 78.0–100.0)
Platelets: 243 10*3/uL (ref 150–400)
RBC: 3.51 MIL/uL — ABNORMAL LOW (ref 3.87–5.11)
RDW: 15.5 % (ref 11.5–15.5)
WBC: 20.2 10*3/uL — ABNORMAL HIGH (ref 4.0–10.5)

## 2015-02-25 SURGERY — Surgical Case
Anesthesia: Epidural | Site: Abdomen

## 2015-02-25 MED ORDER — ALBUTEROL SULFATE (2.5 MG/3ML) 0.083% IN NEBU: 3.0000 mL | INHALATION_SOLUTION | Freq: Four times a day (QID) | RESPIRATORY_TRACT | Status: DC | PRN

## 2015-02-25 MED ORDER — KETOROLAC TROMETHAMINE 30 MG/ML IJ SOLN: 30.0000 mg | Freq: Four times a day (QID) | INTRAMUSCULAR | Status: DC | PRN

## 2015-02-25 MED ORDER — SENNOSIDES-DOCUSATE SODIUM 8.6-50 MG PO TABS
2.0000 | ORAL_TABLET | ORAL | Status: DC
  Administered 2015-02-25 – 2015-03-01 (×4): 2 via ORAL
  Filled 2015-02-25 (×4): qty 2

## 2015-02-25 MED ORDER — WITCH HAZEL-GLYCERIN EX PADS: 1.0000 "application " | MEDICATED_PAD | CUTANEOUS | Status: DC | PRN

## 2015-02-25 MED ORDER — KETOROLAC TROMETHAMINE 30 MG/ML IJ SOLN
30.0000 mg | Freq: Once | INTRAMUSCULAR | Status: DC | PRN
Start: 2015-02-25 — End: 2015-02-25

## 2015-02-25 MED ORDER — MISOPROSTOL 200 MCG PO TABS
ORAL_TABLET | ORAL | Status: AC
  Filled 2015-02-25: qty 2

## 2015-02-25 MED ORDER — PANTOPRAZOLE SODIUM 20 MG PO TBEC
20.0000 mg | DELAYED_RELEASE_TABLET | Freq: Every day | ORAL | Status: DC
  Administered 2015-02-25 – 2015-03-01 (×5): 20 mg via ORAL
  Filled 2015-02-25 (×6): qty 1

## 2015-02-25 MED ORDER — SCOPOLAMINE 1 MG/3DAYS TD PT72: 1.0000 | MEDICATED_PATCH | Freq: Once | TRANSDERMAL | Status: DC

## 2015-02-25 MED ORDER — MORPHINE SULFATE 0.5 MG/ML IJ SOLN
INTRAMUSCULAR | Status: AC
  Filled 2015-02-25: qty 10

## 2015-02-25 MED ORDER — MORPHINE SULFATE (PF) 0.5 MG/ML IJ SOLN
INTRAMUSCULAR | Status: DC | PRN
  Administered 2015-02-25: 3 mg via EPIDURAL

## 2015-02-25 MED ORDER — SODIUM BICARBONATE 8.4 % IV SOLN
INTRAVENOUS | Status: AC
  Filled 2015-02-25: qty 50

## 2015-02-25 MED ORDER — ONDANSETRON HCL 4 MG/2ML IJ SOLN
INTRAMUSCULAR | Status: AC
  Filled 2015-02-25: qty 2

## 2015-02-25 MED ORDER — ONDANSETRON HCL 4 MG/2ML IJ SOLN: 4.0000 mg | Freq: Three times a day (TID) | INTRAMUSCULAR | Status: DC | PRN

## 2015-02-25 MED ORDER — TETANUS-DIPHTH-ACELL PERTUSSIS 5-2.5-18.5 LF-MCG/0.5 IM SUSP
0.5000 mL | Freq: Once | INTRAMUSCULAR | Status: DC
  Filled 2015-02-25: qty 0.5

## 2015-02-25 MED ORDER — SCOPOLAMINE 1 MG/3DAYS TD PT72
MEDICATED_PATCH | TRANSDERMAL | Status: AC
  Filled 2015-02-25: qty 1

## 2015-02-25 MED ORDER — LACTATED RINGERS IV SOLN
INTRAVENOUS | Status: DC | PRN
  Administered 2015-02-25 (×2): via INTRAVENOUS

## 2015-02-25 MED ORDER — SODIUM BICARBONATE 8.4 % IV SOLN
INTRAVENOUS | Status: DC | PRN
  Administered 2015-02-25 (×5): 5 mL via EPIDURAL

## 2015-02-25 MED ORDER — OXYTOCIN 10 UNIT/ML IJ SOLN
40.0000 [IU] | INTRAMUSCULAR | Status: DC | PRN
  Administered 2015-02-25: 40 [IU] via INTRAVENOUS

## 2015-02-25 MED ORDER — OXYTOCIN 40 UNITS IN LACTATED RINGERS INFUSION - SIMPLE MED: 62.5000 mL/h | INTRAVENOUS | Status: AC

## 2015-02-25 MED ORDER — PRENATAL MULTIVITAMIN CH: 1.0000 | ORAL_TABLET | Freq: Every day | ORAL | Status: DC

## 2015-02-25 MED ORDER — DEXTROSE 5 % IV SOLN
INTRAVENOUS | Status: DC
  Administered 2015-02-25: 114.75 mL via INTRAVENOUS

## 2015-02-25 MED ORDER — MEPERIDINE HCL 25 MG/ML IJ SOLN
INTRAMUSCULAR | Status: AC
  Filled 2015-02-25: qty 1

## 2015-02-25 MED ORDER — PRENATAL MULTIVITAMIN CH
1.0000 | ORAL_TABLET | Freq: Every day | ORAL | Status: DC
  Administered 2015-02-26 – 2015-03-01 (×4): 1 via ORAL
  Filled 2015-02-25 (×4): qty 1

## 2015-02-25 MED ORDER — SIMETHICONE 80 MG PO CHEW
80.0000 mg | CHEWABLE_TABLET | ORAL | Status: DC
  Administered 2015-02-25 – 2015-02-27 (×2): 80 mg via ORAL
  Filled 2015-02-25 (×3): qty 1

## 2015-02-25 MED ORDER — NALOXONE HCL 0.4 MG/ML IJ SOLN: 0.4000 mg | INTRAMUSCULAR | Status: DC | PRN

## 2015-02-25 MED ORDER — IBUPROFEN 600 MG PO TABS
600.0000 mg | ORAL_TABLET | Freq: Four times a day (QID) | ORAL | Status: DC
  Administered 2015-02-25 – 2015-03-02 (×19): 600 mg via ORAL
  Filled 2015-02-25 (×19): qty 1

## 2015-02-25 MED ORDER — SCOPOLAMINE 1 MG/3DAYS TD PT72
MEDICATED_PATCH | TRANSDERMAL | Status: DC | PRN
  Administered 2015-02-25: 1 via TRANSDERMAL

## 2015-02-25 MED ORDER — DIPHENHYDRAMINE HCL 25 MG PO CAPS: 25.0000 mg | ORAL_CAPSULE | Freq: Four times a day (QID) | ORAL | Status: DC | PRN

## 2015-02-25 MED ORDER — PHENYLEPHRINE 40 MCG/ML (10ML) SYRINGE FOR IV PUSH (FOR BLOOD PRESSURE SUPPORT)
PREFILLED_SYRINGE | INTRAVENOUS | Status: AC
  Filled 2015-02-25: qty 10

## 2015-02-25 MED ORDER — ONDANSETRON HCL 4 MG/2ML IJ SOLN
4.0000 mg | Freq: Four times a day (QID) | INTRAMUSCULAR | Status: DC | PRN
  Administered 2015-02-25 (×2): 4 mg via INTRAVENOUS
  Filled 2015-02-25 (×2): qty 2

## 2015-02-25 MED ORDER — MEPERIDINE HCL 25 MG/ML IJ SOLN
INTRAMUSCULAR | Status: DC | PRN
Start: 2015-02-25 — End: 2015-02-25
  Administered 2015-02-25: 25 mg via INTRAVENOUS

## 2015-02-25 MED ORDER — MISOPROSTOL 25 MCG QUARTER TABLET
ORAL_TABLET | ORAL | Status: DC | PRN
  Administered 2015-02-25: 400 ug via ORAL

## 2015-02-25 MED ORDER — FLUTICASONE PROPIONATE HFA 44 MCG/ACT IN AERO
2.0000 | INHALATION_SPRAY | Freq: Two times a day (BID) | RESPIRATORY_TRACT | Status: DC
  Administered 2015-02-25 – 2015-03-01 (×7): 2 via RESPIRATORY_TRACT
  Filled 2015-02-25 (×3): qty 10.6

## 2015-02-25 MED ORDER — MEPERIDINE HCL 25 MG/ML IJ SOLN: 6.2500 mg | INTRAMUSCULAR | Status: DC | PRN

## 2015-02-25 MED ORDER — ACETAMINOPHEN 325 MG PO TABS: 650.0000 mg | ORAL_TABLET | ORAL | Status: DC | PRN

## 2015-02-25 MED ORDER — PROMETHAZINE HCL 25 MG/ML IJ SOLN: 6.2500 mg | INTRAMUSCULAR | Status: DC | PRN

## 2015-02-25 MED ORDER — ERYTHROMYCIN 5 MG/GM OP OINT
TOPICAL_OINTMENT | OPHTHALMIC | Status: AC
  Filled 2015-02-25: qty 1

## 2015-02-25 MED ORDER — DIBUCAINE 1 % RE OINT: 1.0000 "application " | TOPICAL_OINTMENT | RECTAL | Status: DC | PRN

## 2015-02-25 MED ORDER — LIDOCAINE-EPINEPHRINE (PF) 2 %-1:200000 IJ SOLN
INTRAMUSCULAR | Status: AC
  Filled 2015-02-25: qty 20

## 2015-02-25 MED ORDER — IBUPROFEN 600 MG PO TABS: 600.0000 mg | ORAL_TABLET | Freq: Four times a day (QID) | ORAL | Status: DC | PRN

## 2015-02-25 MED ORDER — OXYTOCIN 10 UNIT/ML IJ SOLN
INTRAMUSCULAR | Status: AC
  Filled 2015-02-25: qty 4

## 2015-02-25 MED ORDER — GENTAMICIN SULFATE 40 MG/ML IJ SOLN
Freq: Once | INTRAVENOUS | Status: DC
  Filled 2015-02-25: qty 8.75

## 2015-02-25 MED ORDER — FENTANYL CITRATE (PF) 100 MCG/2ML IJ SOLN: 25.0000 ug | INTRAMUSCULAR | Status: DC | PRN

## 2015-02-25 MED ORDER — SIMETHICONE 80 MG PO CHEW
80.0000 mg | CHEWABLE_TABLET | ORAL | Status: DC | PRN
  Administered 2015-02-26: 80 mg via ORAL

## 2015-02-25 MED ORDER — MENTHOL 3 MG MT LOZG: 1.0000 | LOZENGE | OROMUCOSAL | Status: DC | PRN

## 2015-02-25 MED ORDER — LANOLIN HYDROUS EX OINT: 1.0000 "application " | TOPICAL_OINTMENT | CUTANEOUS | Status: DC | PRN

## 2015-02-25 MED ORDER — NALOXONE HCL 1 MG/ML IJ SOLN: 1.0000 ug/kg/h | INTRAMUSCULAR | Status: DC | PRN

## 2015-02-25 MED ORDER — SIMETHICONE 80 MG PO CHEW
80.0000 mg | CHEWABLE_TABLET | Freq: Three times a day (TID) | ORAL | Status: DC
  Administered 2015-02-26 – 2015-03-02 (×11): 80 mg via ORAL
  Filled 2015-02-25 (×11): qty 1

## 2015-02-25 MED ORDER — VITAMIN K1 1 MG/0.5ML IJ SOLN
INTRAMUSCULAR | Status: AC
  Filled 2015-02-25: qty 0.5

## 2015-02-25 MED ORDER — LORATADINE 10 MG PO TABS
10.0000 mg | ORAL_TABLET | Freq: Every day | ORAL | Status: DC
  Administered 2015-02-25 – 2015-03-02 (×6): 10 mg via ORAL
  Filled 2015-02-25 (×7): qty 1

## 2015-02-25 MED ORDER — FLUTICASONE PROPIONATE 50 MCG/ACT NA SUSP
1.0000 | Freq: Every day | NASAL | Status: DC
  Administered 2015-02-26 – 2015-03-01 (×4): 1 via NASAL
  Filled 2015-02-25: qty 16

## 2015-02-25 MED ORDER — ONDANSETRON HCL 4 MG/2ML IJ SOLN
INTRAMUSCULAR | Status: DC | PRN
  Administered 2015-02-25: 4 mg via INTRAVENOUS

## 2015-02-25 MED ORDER — LACTATED RINGERS IV SOLN
INTRAVENOUS | Status: DC
  Administered 2015-02-25 – 2015-02-26 (×2): via INTRAVENOUS

## 2015-02-25 MED ORDER — SODIUM CHLORIDE 0.9 % IJ SOLN: 3.0000 mL | INTRAMUSCULAR | Status: DC | PRN

## 2015-02-25 MED ORDER — LACTATED RINGERS IV SOLN
INTRAVENOUS | Status: DC | PRN
  Administered 2015-02-25: 14:00:00 via INTRAVENOUS

## 2015-02-25 MED ORDER — LABETALOL HCL 100 MG PO TABS
100.0000 mg | ORAL_TABLET | Freq: Once | ORAL | Status: AC
  Administered 2015-02-25: 100 mg via ORAL
  Filled 2015-02-25: qty 1

## 2015-02-25 SURGICAL SUPPLY — 36 items
BENZOIN TINCTURE PRP APPL 2/3 (GAUZE/BANDAGES/DRESSINGS) ×2 IMPLANT
CLAMP CORD UMBIL (MISCELLANEOUS) IMPLANT
CLOTH BEACON ORANGE TIMEOUT ST (SAFETY) ×2 IMPLANT
CONTAINER PREFILL 10% NBF 15ML (MISCELLANEOUS) IMPLANT
DRAPE SHEET LG 3/4 BI-LAMINATE (DRAPES) IMPLANT
DRSG OPSITE POSTOP 4X10 (GAUZE/BANDAGES/DRESSINGS) ×2 IMPLANT
DURAPREP 26ML APPLICATOR (WOUND CARE) ×2 IMPLANT
ELECT REM PT RETURN 9FT ADLT (ELECTROSURGICAL) ×2
ELECTRODE REM PT RTRN 9FT ADLT (ELECTROSURGICAL) ×1 IMPLANT
EXTRACTOR VACUUM KIWI (MISCELLANEOUS) IMPLANT
EXTRACTOR VACUUM M CUP 4 TUBE (SUCTIONS) IMPLANT
GLOVE BIO SURGEON STRL SZ7 (GLOVE) ×2 IMPLANT
GLOVE BIOGEL PI IND STRL 7.0 (GLOVE) ×1 IMPLANT
GLOVE BIOGEL PI INDICATOR 7.0 (GLOVE) ×1
GOWN STRL REUS W/TWL LRG LVL3 (GOWN DISPOSABLE) ×4 IMPLANT
KIT ABG SYR 3ML LUER SLIP (SYRINGE) IMPLANT
NEEDLE HYPO 25X5/8 SAFETYGLIDE (NEEDLE) IMPLANT
NS IRRIG 1000ML POUR BTL (IV SOLUTION) ×2 IMPLANT
PACK C SECTION WH (CUSTOM PROCEDURE TRAY) ×2 IMPLANT
PAD OB MATERNITY 4.3X12.25 (PERSONAL CARE ITEMS) ×2 IMPLANT
RTRCTR C-SECT PINK 25CM LRG (MISCELLANEOUS) IMPLANT
STAPLER VISISTAT 35W (STAPLE) IMPLANT
STRIP CLOSURE SKIN 1/2X4 (GAUZE/BANDAGES/DRESSINGS) ×2 IMPLANT
SUT MON AB-0 CT1 36 (SUTURE) ×6 IMPLANT
SUT PLAIN 0 NONE (SUTURE) IMPLANT
SUT PLAIN 2 0 (SUTURE)
SUT PLAIN ABS 2-0 CT1 27XMFL (SUTURE) IMPLANT
SUT PROLENE 1 CT (SUTURE) ×2 IMPLANT
SUT VIC AB 0 CT1 27 (SUTURE) ×2
SUT VIC AB 0 CT1 27XBRD ANBCTR (SUTURE) ×2 IMPLANT
SUT VIC AB 2-0 CT1 27 (SUTURE) ×2
SUT VIC AB 2-0 CT1 TAPERPNT 27 (SUTURE) ×2 IMPLANT
SUT VIC AB 4-0 KS 27 (SUTURE) ×2 IMPLANT
SUT VICRYL 0 TIES 12 18 (SUTURE) IMPLANT
TOWEL OR 17X24 6PK STRL BLUE (TOWEL DISPOSABLE) ×2 IMPLANT
TRAY FOLEY CATH SILVER 14FR (SET/KITS/TRAYS/PACK) IMPLANT

## 2015-02-25 NOTE — Op Note (Signed)
Cesarean Section Procedure Note   Karla Huber Sortino  02/23/2015 - 02/25/2015  Indications: 38.0 wks, Preeclampsia, A1GDM, labor induction. Arrest of active labor/ protracted labor, faiilure to decent, occiput transverse, large caput and moulding but no decent   Pre-operative Diagnosis: Arrest of labor and descent. Preeclampsia, A1GDM.   Post-operative Diagnosis: Same   Surgeon:  Shea EvansVaishali , MD   Assistants: Raelyn Moraolitta Dawson, CNM  Anesthesia: epidural   Procedure Details:  The patient was seen in the Labor Room. She was induced due to Preeclampsia. She had Cytotec to start induction, followed by foley bulb in cervix that fell out at 5 cm and had protracted latent phase as well as active phase with arrest of rotation and decent, moulding and caput getting larger without decent, so decision was made to proceed with C-section. The risks, benefits, complications, treatment options, and expected outcomes were discussed with the patient. The patient concurred with the proposed plan, giving informed consent. [name] and the procedure verified as C-Section Delivery.  She was brought to the Operating Room. A Time Out was held and the above information confirmed. Gentamicin and Clindamycin given per protocol. After induction of epidural anesthesia, the patient was draped and prepped in the usual sterile manner. A Pfannenstiel Incision was made and carried down through the subcutaneous tissue to the fascia. Fascial incision was made and extended transversely. The fascia was separated from the underlying rectus tissue superiorly and inferiorly. The peritoneum was identified and entered. Peritoneal incision was extended longitudinally carefully since bladder was noted higher and edematous. Alexis retractor placed carefully. The utero-vesical peritoneal reflection was incised transversely and the bladder flap was bluntly freed from the lower uterine segment. A low transverse uterine incision  was made. Amniotic fluid was clear. Baby was in LOT position and with long caput. Delivered from cephalic presentation with rotation and flexion was a healthy Female infant at 14.01 hours with Apgar scores of 8 at one minute and 9 at five minutes. Umbilical cord was clamped and cut cord blood was obtained for evaluation, cord ph was not sent.  Baby was handed to NICU team. The placenta was removed Intact and appeared normal. The uterine outline, tubes and ovaries appeared normal. Uterus was cleaned of debris. The uterine incision was closed with running locked sutures of 0Monocryl in 2 layers.  Hemostasis was observed. Retractor removed. Parietal peritoneum closed with 2-0 Vicryl. Pyramidalis muscles approximated in midline. The fascia was then reapproximated with running sutures of 0Vicryl. The subcuticular closure was performed using 2-0plain gut. The skin was closed with 4-0Vicryl. Steristrips and dressing placed.   Instrument, sponge, and needle counts were correct prior the abdominal closure and were correct at the conclusion of the case.   Findings: Female infant delivered cephalic from LOT position via Kerr hysterotomy at 14.01 hours on 02/25/15 with Apgars 8 and 9. Weight 7 lbs. No extensions. Normal uterus, tubes, ovaries. Placenta intact, to path.    Estimated Blood Loss: 700 cc  Total IV Fluids: 2400 ml LR  Urine Output: 100CC OF clear urine  Specimens: Placenta to path. Cord blood.  Complications: no complications  Disposition: PACU - hemodynamically stable.   Maternal Condition: stable   Baby condition / location:  Couplet care / Skin to Skin  Attending Attestation: I performed the procedure.   Signed: Surgeon(s): Shea EvansVaishali , MD

## 2015-02-25 NOTE — Progress Notes (Signed)
Subjective:   Feeling pressure in lower abd. Recent SVE by RN.  Objective:   VS: Blood pressure 149/86, pulse 126, temperature 97.6 F (36.4 C), temperature source Oral, resp. rate 18, height 5\' 3"  (1.6 m), weight 94.348 kg (208 lb), SpO2 100 %. I/O: adequate FHR: baseline 145 / variability mod / accelerations + / no decelerations Toco: contractions every 2-3 minutes MVU: 155 Cervix: Dilation: 5 Effacement (%): 70 Station: -2 Exam by:: ansah-mensah, rnc  Membranes: clear Pitocin: 20 mu/min MgSO4: 2gm/hr  Assessment:  Labor: latent FHR category I PEC-stable  Plan:  Give po Labetalol now (held earlier), continue Pitocin titration to achieve adequate labor, epidural bolus prn, lateral positions with peanut ball, await active labor.      Donette LarryBHAMBRI, , N MSN, CNM 02/25/2015, 1:37 AM

## 2015-02-25 NOTE — Transfer of Care (Signed)
Immediate Anesthesia Transfer of Care Note  Patient: Karla BeetsRebekah Huber  Procedure(s) Performed: Procedure(s): CESAREAN SECTION (N/A)  Patient Location: PACU  Anesthesia Type:Epidural  Level of Consciousness: awake, alert  and oriented  Airway & Oxygen Therapy: Patient Spontanous Breathing  Post-op Assessment: Report given to RN  Post vital signs: Reviewed  Last Vitals:  Filed Vitals:   02/25/15 1302  BP: 129/79  Pulse: 104  Temp: 37.4 C  Resp:     Complications: No apparent anesthesia complications

## 2015-02-25 NOTE — Progress Notes (Signed)
Patient ID: Karla BeetsRebekah Huber, female   DOB: [birth year], 29 y.o.   MRN: [id number] S: Very tired, nauseated, pain well-controlled with an epidural. (+) pelvic pressure.   Ceasar Mons: Filed Vitals:   02/25/15 1132 02/25/15 1202 02/25/15 1232 02/25/15 1302  BP: 126/71 122/75 110/46 129/79  Pulse: 90 95 83 104  Temp:    99.3 F (37.4 C)  TempSrc:    Oral  Resp: 16 16    Height:      Weight:      SpO2:         FHT:  FHR: 140 bpm, variability: minimal,  accelerations:  Present,  decelerations:  Absent UC:   regular, every 2-4 minutes SVE:   Dilation: 8 Effacement (%): 90 Station: -1, caput Exam by:: R  CNM   A / P: Arrest in active phase of labor  Arrest of dilation GDM A1  Fetal Wellbeing:  Category II Pain Control:  Epidural  Anticipated MOD:  Cesarean Delivery - see Dr. Camillia HerterMody's note for explanation of risks of surgical delivery  [name], , M MSN, CNM 02/25/2015, 12:55 PM

## 2015-02-25 NOTE — Anesthesia Postprocedure Evaluation (Signed)
Anesthesia Post Note  Patient: Karla BeetsRebekah Huber  Procedure(s) Performed: Procedure(s) (LRB): CESAREAN SECTION (N/A)  Anesthesia type: Epidural  Patient location: PACU  Post pain: Pain level controlled  Post assessment: Post-op Vital signs reviewed  Last Vitals:  Filed Vitals:   02/25/15 1700  BP:   Pulse: 108  Temp: 38.1 C  Resp: 21    Post vital signs: Reviewed  Level of consciousness: awake  Complications: No apparent anesthesia complications

## 2015-02-25 NOTE — Progress Notes (Signed)
Patient ID: Karla BeetsRebekah Huber, female   DOB: [birth year], 29 y.o.   MRN: [id number] S: Fatigued, pain somewhat controlled with epidural, (+) pelvic pressure with every contraction that is causing more discomfort.  In RT lateral position with peanut ball - requesting to sit up for a while.  Requesting to freshen up, since no shower in a while.  O: Filed Vitals:   02/25/15 0630 02/25/15 0700 02/25/15 0730 02/25/15 0800  BP: 143/74 111/50 112/57 123/73  Pulse: 97 91 98 96  Temp:    98.6 F (37 C)  TempSrc:    Oral  Resp:  17 16 16   Height:      Weight:      SpO2:         FHT:  FHR: 140 bpm, variability: minimal ,  accelerations:  Present,  decelerations:  Absent UC:   regular, every 1.5-5 minutes SVE:   Not done   A / P: Induction of labor due to preeclampsia,  progressing well on pitocin  Fetal Wellbeing:  Category I Pain Control:  Epidural  Recheck cervix at 0945  Anticipated MOD:  Cautious for NSVD / discussed with patient and spouse that with last VE the fetal head had some molding; which indicates the baby is trying to fit through pelvic openings / if there is no cervical change and/or fetal descent, will consult with Dr. Juliene PinaMody on cesarean delivery / understanding verbalized  Kenard GowerDAWSON, , M MSN, CNM 02/25/2015, 8:38 AM

## 2015-02-25 NOTE — Progress Notes (Signed)
Patient ID: Karla BeetsRebekah Critzer, female   DOB: 06-24-1986, 29 y.o.   MRN: [id number] S: Feeling very tired, pain well-controlled with epidural. Less pelvic pressure since additional dosing of epidural by CRNA.   O: Filed Vitals:   02/25/15 0800 02/25/15 0830 02/25/15 0900 02/25/15 0930  BP: 123/73 132/84 140/88 124/59  Pulse: 96 100 97 100  Temp: 98.6 F (37 C)     TempSrc: Oral     Resp: 16 16 16 16   Height:      Weight:      SpO2:         FHT:  FHR: 130 bpm, variability: minimal ,  accelerations:  Absent,  decelerations:  Absent UC:   regular, every 2-6 minutes / MVUs inadequate at 120 on maximum pitocin dose SVE:   Dilation: 7 Effacement (%): 90 Station: -1 with caput at 0 Exam by: Carloyn Jaeger. , CNM   A / P: Induction of labor due to preeclampsia,  Slow progression on pitocin  Fetal Wellbeing:  Category II Pain Control:  Epidural  Anticipated MOD:  Cautiously proceeding with NSVD  Reassess in 2 hours   *Dr. Juliene PinaMody updated on labor progression - agrees with plan  Kenard GowerAWSON, , M MSN, CNM 02/25/2015, 10:48 AM

## 2015-02-25 NOTE — Progress Notes (Signed)
Subjective:   Comfortable with epidural, feeling pressure on sacrum  Objective:   VS: Blood pressure 110/55, pulse 99, temperature 98.6 F (37 C), temperature source Oral, resp. rate 18, height 5\' 3"  (1.6 m), weight 94.348 kg (208 lb), SpO2 100 %. BS: 143-had juice and popsicle FHR: baseline 140 / variability mod / accelerations + / no decelerations Toco: contractions every 1-4 minutes Cervix: 6/80/-2, +caput Membranes: clear Pitocin: 20 mu/min MVU: 165  Assessment:  Labor: early active FHR category I PEC-stable A1GDM-stable  Plan:  Exaggerated Sims with peanutball, continue Pitocin and MgSO4, epidural bolus prn, anticipate labor progression. Dr. Juliene PinaMody updated with A/P.     Donette LarryBHAMBRI, , N MSN, CNM 02/25/2015, 3:53 AM

## 2015-02-25 NOTE — Progress Notes (Signed)
Juanetta BeetsRebekah Taitano is a 29 y.o. G1P0 at 3547w0d, IOL for preeclampsia.   Objective: BP 122/75 mmHg  Pulse 95  Temp(Src) 98.5 F (36.9 C) (Oral)  Resp 16  Ht 5\' 3"  (1.6 m)  Wt 208 lb (94.348 kg)  BMI 36.85 kg/m2  SpO2 100% I/O last 3 completed shifts: In: 6374.5 [P.O.:1445; I.V.:4929.5] Out: 4415 [Urine:4015; Emesis/NG output:400] Total I/O In: 1220 [P.O.:240; I.V.:980] Out: 245 [Urine:245]  FHT:  FHR: 130 bpm, variability: moderate,  accelerations:  Present,  decelerations:  Absent UC:   regular, every 3 minutes SVE:   Dilation: 7 Effacement (%): 90 Station: -1 Exam by:: R Dawson CNM Arrest of labor, protracted dilatation and arrest of decent   Labs: Lab Results  Component Value Date   WBC 20.2* 02/25/2015   HGB 9.6* 02/25/2015   HCT 29.8* 02/25/2015   MCV 84.9 02/25/2015   PLT 243 02/25/2015    Assessment / Plan: Arrest in active phase of labor. Proceed with C/section delivery Preeclampsia:  on magnesium sulfate, no signs or symptoms of toxicity and labs stable Fetal Wellbeing:  Category I Pain Control:  Epidural I/D:  n/a Anticipated MOD:  C/section   Risks/complications of surgery reviewed incl infection, bleeding, damage to internal organs including bladder, bowels, ureters, blood vessels, other risks from anesthesia, VTE and delayed complications of any surgery, complications in future surgery reviewed. Also discussed neonatal complications incl difficult delivery, laceration, vacuum assistance, TTN etc. Pt understands and agrees, all concerns addressed.    , R 02/25/2015, 12:55 PM

## 2015-02-25 NOTE — Progress Notes (Signed)
Subjective:   Comfortable with epidural, lateral with peanut ball.  Objective:   VS: Blood pressure 112/57, pulse 98, temperature 98.4 F (36.9 C), temperature source Oral, resp. rate 16, height 5\' 3"  (1.6 m), weight 94.348 kg (208 lb), SpO2 100 %. FHR: baseline 135 / variability mod / accelerations + / no decelerations Toco: contractions every 2-3 minutes Cervix: 6/90/-2, ++molding Membranes: clear Pitocin: 30 mu/min MVU: 160  Assessment:  Labor: protracted active FHR category I PEC-stable A1GDM-stable  Plan:  No progress or descent, continue repositioning with peanutball, continue current Pitocin rate, recheck in 2 hrs, guarded for VD. Dr. Juliene PinaMody updated with A/P.     Donette LarryBHAMBRI, , N MSN, CNM 02/25/2015, 7:56 AM

## 2015-02-26 ENCOUNTER — Encounter (HOSPITAL_COMMUNITY): Payer: Self-pay | Admitting: Obstetrics and Gynecology

## 2015-02-26 LAB — COMPREHENSIVE METABOLIC PANEL
ALT: 14 U/L (ref 14–54)
AST: 23 U/L (ref 15–41)
Albumin: 2.1 g/dL — ABNORMAL LOW (ref 3.5–5.0)
Alkaline Phosphatase: 172 U/L — ABNORMAL HIGH (ref 38–126)
Anion gap: 9 (ref 5–15)
BUN: 10 mg/dL (ref 6–20)
CO2: 23 mmol/L (ref 22–32)
Calcium: 7 mg/dL — ABNORMAL LOW (ref 8.9–10.3)
Chloride: 100 mmol/L — ABNORMAL LOW (ref 101–111)
Creatinine, Ser: 0.86 mg/dL (ref 0.44–1.00)
GFR calc Af Amer: >60 mL/min (ref >60)
GFR calc non Af Amer: >60 mL/min (ref >60)
Glucose, Bld: 99 mg/dL (ref 70–99)
Potassium: 4.3 mmol/L (ref 3.5–5.1)
Sodium: 132 mmol/L — ABNORMAL LOW (ref 135–145)
Total Bilirubin: 0.1 mg/dL — ABNORMAL LOW (ref 0.3–1.2)
Total Protein: 4.9 g/dL — ABNORMAL LOW (ref 6.5–8.1)

## 2015-02-26 LAB — CBC
HCT: 27.9 % — ABNORMAL LOW (ref 36.0–46.0)
Hemoglobin: 9 g/dL — ABNORMAL LOW (ref 12.0–15.0)
MCH: 27.4 pg (ref 26.0–34.0)
MCHC: 32.3 g/dL (ref 30.0–36.0)
MCV: 84.8 fL (ref 78.0–100.0)
Platelets: 218 10*3/uL (ref 150–400)
RBC: 3.29 MIL/uL — ABNORMAL LOW (ref 3.87–5.11)
RDW: 15.6 % — ABNORMAL HIGH (ref 11.5–15.5)
WBC: 15.5 10*3/uL — ABNORMAL HIGH (ref 4.0–10.5)

## 2015-02-26 MED ORDER — ACETAMINOPHEN-CODEINE #3 300-30 MG PO TABS
1.0000 | ORAL_TABLET | ORAL | Status: DC | PRN
  Administered 2015-02-27 – 2015-03-01 (×5): 1 via ORAL
  Filled 2015-02-26 (×5): qty 1

## 2015-02-26 MED ORDER — POLYSACCHARIDE IRON COMPLEX 150 MG PO CAPS
150.0000 mg | ORAL_CAPSULE | Freq: Every day | ORAL | Status: DC
  Administered 2015-02-26 – 2015-02-27 (×2): 150 mg via ORAL
  Filled 2015-02-26 (×3): qty 1

## 2015-02-26 NOTE — Lactation Note (Signed)
This note was copied from the chart of Karla Huber [place]. Lactation Consultation Note  Patient Name: Karla Huber WUJWJ'XToday's Date: 02/26/2015 Reason for consult: Initial assessment   Initial consult at 3527 hours old; P1 mom in AICU on Mag.  GA 38.0; BW 7 lbs, 0.9 oz.  LS-8 by RN.  RN reports hearing swallows. Infant has breastfed x8 (10-15 min) + attempts x2 + EBM x1 (5 ml); voids-7 since birth; stools-6 since birth. RN reports mom is having difficulty latching infant to right side; mom states infant get frustrated on right side but states that breastfeeding is going well with no complaints. Visitors in room at time of visit.  [facility] denied [facility] at [facility]   RN stated mom has a scab appearing place on right nipple at top; comfort gels given to RN for patient to use.  LC unable to assess at this time.   Educated on feeding cues, size of infant's stomach, and cluster feeding. Mom has Medela PIS DEBP at home she has already received from insurance company.   Lactation brochure given and informed of outpatient services and hospital support group. Encouraged mom to call for assistance as needed.    Maternal Data Formula [facility] Ex[facility]:  [facility] Reason for exclusion: Admission to Intensive Care Unit (ICU) post-partum Has patient been taught Hand Expression?:  (visitors in room at time of visit)  Feeding Feeding Type: Breast Fed Length of feed: 0 min  LATCH Score/Interventions                      Lactation Tools Discussed/Used WIC Program: No   Consult Status Consult Status: Follow-up Date: 02/27/15 Follow-up type: In-patient    Lendon KaVann,  Walker 02/26/2015, 5:37 PM

## 2015-02-26 NOTE — Progress Notes (Signed)
Post Partum Day #1 (4/30, 2.01 pm C/section, female), HD #3.1/2. Preeclampsia, A1GDM  Subjective: Came back to see pt this afternoon since she was sleeping this morning. Per FOB, she was doing very well and VS were stable, she had started to diurese well.   Saw pt at 3.30 pm. Feels well. Tolerated regular diet. Ambulated since foley was removed and magnesium discontinued at 2 pm. NO HA/SOB/CP/SOB/ vision problem.   Objective: BP 128/71 mmHg  Pulse 83  Temp(Src) 98.6 F (37 C) (Oral)  Resp 18  Ht 5\' 3"  (1.6 m)  Wt 214 lb 0.4 oz (97.081 kg)  BMI 37.92 kg/m2  SpO2 98%  Breastfeeding? Unknown Temp:  [97.8 F (36.6 C)-99.8 F (37.7 C)] 98.6 F (37 C) (05/01 1708) Pulse Rate:  [74-111] 83 (05/01 1708) Resp:  [18-20] 18 (05/01 1708) BP: (117-157)/(67-90) 128/71 mmHg (05/01 1708) SpO2:  [90 %-98 %] 98 % (05/01 1708) Weight:  [211 lb 6.4 oz (95.89 kg)-214 lb 0.4 oz (97.081 kg)] 214 lb 0.4 oz (97.081 kg) (05/01 0603)   I/O last 3 completed shifts: In: 8732.4 [P.O.:1670; I.V.:7062.4] Out: 3860 [Urine:2860; Emesis/NG output:300; Blood:700] Total I/O In: 720 [P.O.:720] Out: 2250 [Urine:2250]  Physical Exam:  General: alert and cooperative  Lungs CTA bilateral CV RRR Lochia: appropriate Uterine Fundus: firm Abdomen soft, non tender, normal bowel sounds  Incision: Dressing dry DVT Evaluation: No evidence of DVT seen on physical exam. Edema reduced. DTR +2/+2  CBC Latest Ref Rng 02/26/2015 02/25/2015 02/24/2015  WBC 4.0 - 10.5 K/uL 15.5(H) 20.2(H) 16.7(H)  Hemoglobin 12.0 - 15.0 g/dL 9.0(L) 9.6(L) 10.7(L)  Hematocrit 36.0 - 46.0 % 27.9(L) 29.8(L) 32.4(L)  Platelets 150 - 400 K/uL 218 243 265   CMP Latest Ref Rng 02/26/2015 02/25/2015 02/23/2015  Glucose 70 - 99 mg/dL 99 161(W132(H) 960(A110(H)  BUN 6 - 20 mg/dL 10 9 12   Creatinine 0.44 - 1.00 mg/dL 5.400.86 9.810.93 1.910.73  Sodium 135 - 145 mmol/L 132(L) 132(L) 134(L)  Potassium 3.5 - 5.1 mmol/L 4.3 4.2 4.1  Chloride 101 - 111 mmol/L 100(L) 102 107   CO2 22 - 32 mmol/L 23 19 19   Calcium 8.9 - 10.3 mg/dL 7.0(L) 7.5(L) 9.0  Total Protein 6.5 - 8.1 g/dL 4.9(L) 6.1 6.6  Total Bilirubin 0.3 - 1.2 mg/dL 4.7(W0.1(L) 0.5 2.9(F0.2(L)  Alkaline Phos 38 - 126 U/L 172(H) 205(H) 210(H)  AST 15 - 41 U/L 23 22 21   ALT 14 - 54 U/L 14 15 14     Assessment/Plan: 1) Postpartum C/s day 1. Girl. Breast feeding. C/s - uneventful, slight anemia, Iron from tomorrow, d/w pt. Pain well controlled, needs T#3 (cannot take Percocet or Vicodin) 2) Preeclampsia  Stable BPs, labs and magnesium x24 hrs. D/c'ed at 2 pm, will transfer to floor at 6 pm is remained stable. Precautions and warning s/s reviewed.  3) GHTN- no anti-HTN meds at present. She is doing well, continue BP checks 4) GDM -A1 - no need to check now. Recheck at 6 wks at home and bring log to office, 5) Postpartum- CANNOT take TDAP due to high fever after last shot fer years back, so patient declined in pregnancy and now. She was advised Pneumovax due to Asthma hx, but declined due to same fear.  6) Asthma - stable, s/p treatment yesterday in PACU.   , R 02/26/2015, 9:20 AM

## 2015-02-26 NOTE — Anesthesia Postprocedure Evaluation (Signed)
  Anesthesia Post-op Note  Patient: Karla BeetsRebekah Huber  Procedure(s) Performed: Procedure(s): CESAREAN SECTION (N/A)  Patient Location: A-ICU  Anesthesia Type:Epidural  Level of Consciousness: awake, alert , oriented and patient cooperative  Airway and Oxygen Therapy: Patient Spontanous Breathing  Post-op Pain: none  Post-op Assessment: Post-op Vital signs reviewed, Patient's Cardiovascular Status Stable, Respiratory Function Stable, Patent Airway, No headache, No backache, No residual numbness and No residual motor weakness  Post-op Vital Signs: Reviewed and stable  Last Vitals:  Filed Vitals:   02/26/15 0600  BP: 127/80  Pulse: 87  Temp:   Resp: 20    Complications: No apparent anesthesia complications

## 2015-02-26 NOTE — Addendum Note (Signed)
Addendum  created 02/26/15 [id number] by Yolonda KidaAlison L , CRNA   Modules edited: Notes Section   Notes Section:  File: [id number]

## 2015-02-27 ENCOUNTER — Encounter (HOSPITAL_COMMUNITY): Payer: Self-pay | Admitting: Obstetrics & Gynecology

## 2015-02-27 LAB — COMPREHENSIVE METABOLIC PANEL
ALT: 20 U/L (ref 14–54)
AST: 23 U/L (ref 15–41)
Albumin: 2.2 g/dL — ABNORMAL LOW (ref 3.5–5.0)
Alkaline Phosphatase: 142 U/L — ABNORMAL HIGH (ref 38–126)
Anion gap: 7 (ref 5–15)
BUN: 8 mg/dL (ref 6–20)
CO2: 23 mmol/L (ref 22–32)
Calcium: 7.4 mg/dL — ABNORMAL LOW (ref 8.9–10.3)
Chloride: 107 mmol/L (ref 101–111)
Creatinine, Ser: 0.88 mg/dL (ref 0.44–1.00)
GFR calc Af Amer: >60 mL/min (ref >60)
GFR calc non Af Amer: >60 mL/min (ref >60)
Glucose, Bld: 100 mg/dL — ABNORMAL HIGH (ref 70–99)
Potassium: 3.9 mmol/L (ref 3.5–5.1)
Sodium: 137 mmol/L (ref 135–145)
Total Bilirubin: 0.3 mg/dL (ref 0.3–1.2)
Total Protein: 5.6 g/dL — ABNORMAL LOW (ref 6.5–8.1)

## 2015-02-27 LAB — CBC
HCT: 27 % — ABNORMAL LOW (ref 36.0–46.0)
Hemoglobin: 8.7 g/dL — ABNORMAL LOW (ref 12.0–15.0)
MCH: 27.8 pg (ref 26.0–34.0)
MCHC: 32.2 g/dL (ref 30.0–36.0)
MCV: 86.3 fL (ref 78.0–100.0)
Platelets: 234 10*3/uL (ref 150–400)
RBC: 3.13 MIL/uL — ABNORMAL LOW (ref 3.87–5.11)
RDW: 15.7 % — ABNORMAL HIGH (ref 11.5–15.5)
WBC: 16.8 10*3/uL — ABNORMAL HIGH (ref 4.0–10.5)

## 2015-02-27 LAB — URIC ACID: Uric Acid, Serum: 6.6 mg/dL (ref 2.3–6.6)

## 2015-02-27 MED ORDER — POLYSACCHARIDE IRON COMPLEX 150 MG PO CAPS
150.0000 mg | ORAL_CAPSULE | Freq: Two times a day (BID) | ORAL | Status: DC
  Administered 2015-02-27 – 2015-03-02 (×6): 150 mg via ORAL
  Filled 2015-02-27 (×6): qty 1

## 2015-02-27 MED ORDER — MAGNESIUM OXIDE 400 (241.3 MG) MG PO TABS
200.0000 mg | ORAL_TABLET | Freq: Every day | ORAL | Status: DC
  Administered 2015-02-27 – 2015-02-28 (×2): 200 mg via ORAL
  Filled 2015-02-27 (×2): qty 0.5

## 2015-02-27 NOTE — Progress Notes (Signed)
PPD#2 s/p PCS, PEC  S: pt notes no HA, no vision change, no RUQ pain. Tol reg po, minimal incision pain, controlled with po meds. + ambulating,  + void, no CP/ SOB.   O: Filed Vitals:   02/26/15 2356 02/27/15 0300 02/27/15 0759 02/27/15 0900  BP: 150/79 150/74  153/79  Pulse: 95 94  86  Temp: 97.6 F (36.4 C) 99.6 F (37.6 C) 99.6 F (37.6 C) 98.1 F (36.7 C)  TempSrc:   Oral Oral  Resp: 20 20  18   Height:      Weight:      SpO2:       Gen: well appearing, no distress CV: RRR Pulm: CTAB Abd: no RUQ pain, fundus below umbilicus/ appropriately tender GU: def LE: 1+ edema, 2+ DTR, no clonus Inc: C/D dressed  CBC    Component Value Date/Time   WBC 16.8* 02/27/2015 1039   RBC 3.13* 02/27/2015 1039   HGB 8.7* 02/27/2015 1039   HCT 27.0* 02/27/2015 1039   PLT 234 02/27/2015 1039   MCV 86.3 02/27/2015 1039   MCH 27.8 02/27/2015 1039   MCHC 32.2 02/27/2015 1039   RDW 15.7* 02/27/2015 1039     CMP     Component Value Date/Time   NA 137 02/27/2015 1039   K 3.9 02/27/2015 1039   CL 107 02/27/2015 1039   CO2 23 02/27/2015 1039   GLUCOSE 100* 02/27/2015 1039   BUN 8 02/27/2015 1039   CREATININE 0.88 02/27/2015 1039   CALCIUM 7.4* 02/27/2015 1039   PROT 5.6* 02/27/2015 1039   ALBUMIN 2.2* 02/27/2015 1039   AST 23 02/27/2015 1039   ALT 20 02/27/2015 1039   ALKPHOS 142* 02/27/2015 1039   BILITOT 0.3 02/27/2015 1039   GFRNONAA >60 02/27/2015 1039   GFRAA >60 02/27/2015 1039    A/P: POD#2 s/p PCS for failed IOL in setting of PEC - PEC. Since Mag stopped yesterday, bp's trending up. Labs stable and pt w/o sx of PEC. D/w pt option of starting bp meds now, given her usual low bp's, vs. Expectant management til tomorrow am. If bps remain elevated through today and tonight, pt aware we will likely start her on meds tomorrow and she will need to stay an additional night to ensure meds working. Pt agrees.  - Anemia. Start iron. - post-op. Routine care  ,  A. 02/27/2015 12:08 PM

## 2015-02-28 MED ORDER — HYDROCHLOROTHIAZIDE 12.5 MG PO CAPS
12.5000 mg | ORAL_CAPSULE | Freq: Every day | ORAL | Status: DC
  Administered 2015-02-28 – 2015-03-01 (×2): 12.5 mg via ORAL
  Filled 2015-02-28 (×4): qty 1

## 2015-02-28 MED ORDER — MAGNESIUM OXIDE 400 (241.3 MG) MG PO TABS
400.0000 mg | ORAL_TABLET | Freq: Every day | ORAL | Status: DC
  Administered 2015-03-01: 400 mg via ORAL
  Filled 2015-02-28 (×2): qty 1

## 2015-02-28 NOTE — Lactation Note (Signed)
This note was copied from the chart of Karla Huber. Lactation Consultation Note  8% Weight loss,  Phototherapy started. Upon entering the room baby sucking on pacifier.  Reviewed reasons to wait on using pacifier including masking feeding cues. Noticed baby has tight lingual frenulum.  Parents aware. Baby latched in cross cradle position.  Sucks and swallows observed. Encouraged mother to massage breast to keep baby active. Parents state feedings have improved and breastmilk seems to be transitioning. Mother has been burping between feedings and feeding on both breasts. Right breast had bruise that is now improved.  Encouraged applying ebm and wearing gels.   Patient Name: Karla Juanetta BeetsRebekah Ervine ZOXWR'UToday's Date: 02/28/2015 Reason for consult: Follow-up assessment   Maternal Data    Feeding Feeding Type: Breast Fed  LATCH Score/Interventions Latch: Grasps breast easily, tongue down, lips flanged, rhythmical sucking. Intervention(s): Waking techniques Intervention(s): Breast massage  Audible Swallowing: Spontaneous and intermittent  Type of Nipple: Everted at rest and after stimulation  Comfort (Breast/Nipple): Filling, red/small blisters or bruises, mild/mod discomfort  Problem noted: Mild/Moderate discomfort Interventions (Mild/moderate discomfort): Comfort gels;Hand expression  Hold (Positioning): No assistance needed to correctly position infant at breast.  LATCH Score: 9  Lactation Tools Discussed/Used     Consult Status Consult Status: Follow-up Date: 03/01/15 Follow-up type: In-patient    Dahlia ByesBerkelhammer,  [facility] 02/28/2015, 9:15 AM

## 2015-02-28 NOTE — Progress Notes (Signed)
Pt BP = 152/87 @ 1812, rpt BP = 154/81. Pt declines HA, visual changes, or rt upper quadrant pain. Notified Marlinda Mikeanya Bailey CNM of BP, order care instruction to call MD with BP > 160/100.

## 2015-02-28 NOTE — Progress Notes (Signed)
POSTOPERATIVE DAY # 3 S/P cesarean section / pre-ecalmpsia   S:         Reports feeling ok - really sore and tired / no PIH symptoms except edema             Tolerating po intake / no nausea / no vomiting / + flatus / no BM             Bleeding is light             Pain controlled with motrin and percocet             Up ad lib / ambulatory/ voiding QS  Newborn breast feeding  / newborn remains inpatient on bili-lights   O:  VS: BP 144/79 mmHg  Pulse 86  Temp(Src) 98.2 F (36.8 C) (Oral)  Resp 18  Ht 5\' 3"  (1.6 m)  Wt 97.081 kg (214 lb 0.4 oz)  BMI 37.92 kg/m2  SpO2 96%  Breastfeeding? Unknown              BP: 144/79 - 158/73 - 127/81 - 153/79 - 150/74   LABS: stable labs from 5/2 - no significant change              Recent Labs  02/26/15 0515 02/27/15 1039  WBC 15.5* 16.8*  HGB 9.0* 8.7*  PLT 218 234               Bloodtype: --/--/O POS, O POS (04/28 2021)  Rubella: Immune (10/05 0000)                                             I&O: net negative             Physical Exam:             Alert and Oriented X3  Lungs: Clear and unlabored  Heart: regular rate and rhythm / no mumurs  Abdomen: soft, non-tender, non-distended, active BS             Fundus: firm, non-tender, U-1             Dressing intact honeycomb              Incision:  approximated with sutures / no erythema / no ecchymosis / no drainage  Perineum: intact  Lochia: light  Extremities: 2+ edema, no calf pain or tenderness, negative Homans  A:        POD # 3 S/P CS             Pre-eclampsia - resolving with stable labs and BP            ABL anemia            dependent edema  P:        Routine postoperative care              HCTZ 12.5mg  x 3-5 days / increase water intake             Anticipate DC tomorrow - BP recheck in office early next week             Increase frequent feedings with newborn jaundice   Marlinda MikeBAILEY,  CNM, MSN, [facility]ernFACNM 02/28/2015, 8:29 AM

## 2015-03-01 MED ORDER — NIFEDIPINE ER OSMOTIC RELEASE 30 MG PO TB24
30.0000 mg | ORAL_TABLET | Freq: Every day | ORAL | Status: DC
  Administered 2015-03-01 – 2015-03-02 (×2): 30 mg via ORAL
  Filled 2015-03-01 (×2): qty 1

## 2015-03-01 NOTE — Progress Notes (Signed)
Patient ID: Karla BeetsRebekah Huber, female   DOB: [birth year], 29 y.o.   MRN: [id number] Subjective: POD# 4 Information for the patient's newborn:  Karla Huber, Girl Karla Huber [[id number]][[id number]]  female   Reports feeling worried about inconsistent BPs. Feeling very tired, because have had no sleep. Desires to be discharged home. Feeding: breast Patient reports tolerating PO.  Breast symptoms: none Pain controlled with ibuprofen (OTC) and narcotic analgesics including Percocet Denies HA/SOB/C/P/N/V/dizziness. Flatus present. (+) BM. She reports vaginal bleeding as normal, without clots.  She is ambulating, urinating without difficult.     Objective:   VS:  Filed Vitals:   03/01/15 0519 03/01/15 0900 03/01/15 1308 03/01/15 1602  BP: 159/86 144/84 146/85 151/90  Pulse: 77 68 75 89  Temp: 98.8 F (37.1 C) 99.1 F (37.3 C) 98.5 F (36.9 C) 98.4 F (36.9 C)  TempSrc: Oral Oral Oral Oral  Resp: 18 18 18 18   Height:      Weight:      SpO2: 98%             Recent Labs  02/27/15 1039  WBC 16.8*  HGB 8.7*  HCT 27.0*  PLT 234     Blood type: O POS (04/28 2021)  Rubella: Immune (10/05 0000)     Physical Exam:  General: alert, cooperative and fatigued Abdomen: soft, nontender, normal bowel sounds Incision: Tegaderm and Honeycomb C/D/I - skin well approximated with sutures Uterine Fundus: firm, 3 FB below umbilicus, nontender Lochia: minimal Ext: edema 1+ and Homans sign is negative, no sign of DVT   Assessment/Plan: 29 y.o.   POD# 4.  s/p Cesarean Delivery.  Indications: Arrest of labor and descent. Preeclampsia, A1GDM                Principal Problem:   Postpartum care following cesarean delivery (4/30) Active Problems:   Preeclampsia   Mild preeclampsia delivered  Doing well, stable.               Ambulate Routine post-op care No d/c home today d/t labile BPs and restart on new BP med Anticipate discharge home tomorrow  [name], , M, MSN, CNM 03/01/2015, 4:00 PM

## 2015-03-01 NOTE — Progress Notes (Addendum)
Patient ID: Karla BeetsRebekah Huber, female   DOB: 08/14/1986, 29 y.o.   MRN: [id number] Reviewed VS, BPs and d/w CM on call. Start Nifedipine 30mg  XL daily and reassess BPs today. Will need immediate post discharge f/up in office in 2-3 days to assess stability after anticipated D/c home tomorrow.  V., MD Spoke with patient again, desires d/c home, has BP machine and used it in last wk of pregnancy and has Procardia 30mg  XL as well. Check few more BPs today and if all <150/90, D/c home and reassess in office 5/9. Pt to report back home BPs on 5/5, 5./6 to office RN. Agrees.  Warning s/s reviewed, husband in room as well, both voiced understanding.   V., MD

## 2015-03-01 NOTE — Lactation Note (Signed)
This note was copied from the chart of Karla Huber Nery. Lactation Consultation Note  Very Brief consult.  Parents denied questions or problems. Reviewed engorgement care and monitoring voids/stools. Suggest parents call if they need further assistance.  Patient Name: Karla Huber Shanley UJWJX'BToday's Date: 03/01/2015 Reason for consult: Follow-up assessment   Maternal Data    Feeding Feeding Type: Breast Fed Length of feed: 45 min  LATCH Score/Interventions                      Lactation Tools Discussed/Used     Consult Status Consult Status: Complete    Hardie PulleyBerkelhammer,  [place] 03/01/2015, 11:20 AM

## 2015-03-02 MED ORDER — MAGNESIUM OXIDE 400 (241.3 MG) MG PO TABS: 400.0000 mg | ORAL_TABLET | Freq: Every day | ORAL | Status: DC

## 2015-03-02 MED ORDER — POLYSACCHARIDE IRON COMPLEX 150 MG PO CAPS: 150.0000 mg | ORAL_CAPSULE | Freq: Two times a day (BID) | ORAL | Status: DC

## 2015-03-02 MED ORDER — HYDROCHLOROTHIAZIDE 12.5 MG PO CAPS: 12.5000 mg | ORAL_CAPSULE | Freq: Every day | ORAL | Status: DC

## 2015-03-02 NOTE — Discharge Summary (Signed)
POSTOPERATIVE DISCHARGE SUMMARY:  Patient ID: Karla Huber MRN: [id number] DOB/AGE: Feb 27, 1986 29 y.o.  Admit date: 02/23/2015 Admission Diagnoses: 37.5 weeks GDMa1 / pre-eclampsia  Discharge date:  03/01/2015 Discharge Diagnoses: POD 4 s/p cesarean section - arrest of active labor / GDMa1 - delivered / Pre-eclampsia - stable/resolving  Prenatal history: G1P1000   EDC : 03/11/2015, Alternate EDD Entry  [facility]  Primary provider : Marlinda Mikeanya Bailey Prenatal course complicated by GERD / GDMa1 /   Prenatal Labs: ABO, Rh: --/--/O POS, O POS (04/28 2021)  Antibody: NEG (04/28 2021) Rubella: Immune (10/05 0000)   RPR: Non Reactive (04/28 2021)  HBsAg: Negative (10/05 0000)  HIV: Non-reactive (10/05 0000)  GTT : ABNORMAL GBS: Negative (04/18 0000)   Medical / Surgical History :  Past medical history:  Past Medical History  Diagnosis Date  . Gestational diabetes     diet controlled  . Traumatic injury during pregnancy in third trimester 02/09/2015  . Hx of varicella   . Asthma     pulmocort daily  . Hypertension     pre-eclampsia  . Headache   . Postpartum care following cesarean delivery (4/30) 02/25/2015    Past surgical history:  Past Surgical History  Procedure Laterality Date  . Tonsillectomy    . Foot surgery    . Knee surgery    . Cesarean section N/A 02/25/2015    Procedure: CESAREAN SECTION;  Surgeon: Shea EvansVaishali Mody, MD;  Location: [facility];  Service: Obstetrics;  Laterality: N/A;    Family History:  Family History  Problem Relation Age of Onset  . Heart disease Mother   . Arthritis Sister   . Hypertension Maternal Grandmother   . Cancer Cousin     AML    Social History:  reports that she has never smoked. She has never used smokeless tobacco. She reports that she does not drink alcohol or use illicit drugs.  Allergies: Amoxicillin and Hydrocodone   Current Medications at time of admission:  Prior to Admission  medications   Medication Sig Start Date End Date Taking? Authorizing Provider  acetaminophen (TYLENOL) 500 MG tablet Take 500-1,000 mg by mouth every 6 (six) hours as needed for headache. Depends on pain level if patient takes 1 or 2 tablets.   Yes Historical Provider, MD  budesonide (PULMICORT) 180 MCG/ACT inhaler Inhale 2 puffs into the lungs 2 (two) times daily.   Yes Historical Provider, MD  budesonide (RHINOCORT AQUA) 32 MCG/ACT nasal spray Place 2 sprays into the nose daily.    Yes Historical Provider, MD  calcium carbonate (TUMS - DOSED IN MG ELEMENTAL CALCIUM) 500 MG chewable tablet Chew 1 tablet by mouth daily as needed for indigestion or heartburn.    Yes Historical Provider, MD  cetirizine (ZYRTEC) 10 MG tablet Take 10 mg by mouth daily. Used in Spring & Fall, for seasonal allergies.   Yes Historical Provider, MD  NIFEdipine (PROCARDIA-XL/ADALAT CC) 30 MG 24 hr tablet Take 1 tablet (30 mg total) by mouth daily. 02/13/15  Yes Marlinda Mikeanya Bailey, CNM  pantoprazole (PROTONIX) 20 MG tablet Take 20 mg by mouth daily.   Yes Historical Provider, MD  Prenatal Vit-Fe Fumarate-FA (PRENATAL MULTIVITAMIN) TABS tablet Take 1 tablet by mouth daily with supper.   Yes Historical Provider, MD  albuterol (PROVENTIL HFA;VENTOLIN HFA) 108 (90 BASE) MCG/ACT inhaler Inhale 2 puffs into the lungs every 6 (six) hours as needed for wheezing or shortness of breath.    Historical Provider, MD  hydrochlorothiazide (MICROZIDE) 12.5 MG capsule Take 1 capsule (12.5 mg total) by mouth daily. 03/02/15   Marlinda Mikeanya Bailey, CNM  iron polysaccharides (NIFEREX) 150 MG capsule Take 1 capsule (150 mg total) by mouth 2 (two) times daily. 03/02/15   Marlinda Mikeanya Bailey, CNM  magnesium oxide (MAG-OX) 400 (241.3 MG) MG tablet Take 1 tablet (400 mg total) by mouth daily. 03/02/15   Marlinda Mikeanya Bailey, CNM    Intrapartum Course:  Admit for induction labor with labor progression to 7cm dilation with protracted labor curve and lack of descent with persistent  OT Pain management: epidural Complicated by: pre-eclampsia - required magnesium sulfate prophylaxis intrapartum and postpartum Interventions required: cesarean section delivery  Procedures: Cesarean section delivery on 02/25/2015 with delivery of  viable newborn by Dr Juliene PinaMody   See operative report for further details APGAR (1 MIN): 8   APGAR (5 MINS): 9    Postoperative / postpartum course:  Uncomplicated with discharge on POD 4  Discharge Instructions:  Discharged Condition: stable  Activity: pelvic rest and postoperative restrictions x 2   Diet: routine  Medications:    Medication List    TAKE these medications        acetaminophen 500 MG tablet  Commonly known as:  TYLENOL  Take 500-1,000 mg by mouth every 6 (six) hours as needed for headache. Depends on pain level if patient takes 1 or 2 tablets.     albuterol 108 (90 BASE) MCG/ACT inhaler  Commonly known as:  PROVENTIL HFA;VENTOLIN HFA  Inhale 2 puffs into the lungs every 6 (six) hours as needed for wheezing or shortness of breath.     budesonide 180 MCG/ACT inhaler  Commonly known as:  PULMICORT  Inhale 2 puffs into the lungs 2 (two) times daily.     budesonide 32 MCG/ACT nasal spray  Commonly known as:  RHINOCORT AQUA  Place 2 sprays into the nose daily.     calcium carbonate 500 MG chewable tablet  Commonly known as:  TUMS - dosed in mg elemental calcium  Chew 1 tablet by mouth daily as needed for indigestion or heartburn.     cetirizine 10 MG tablet  Commonly known as:  ZYRTEC  Take 10 mg by mouth daily. Used in Spring & Fall, for seasonal allergies.     hydrochlorothiazide 12.5 MG capsule  Commonly known as:  MICROZIDE  Take 1 capsule (12.5 mg total) by mouth daily.     iron polysaccharides 150 MG capsule  Commonly known as:  NIFEREX  Take 1 capsule (150 mg total) by mouth 2 (two) times daily.     magnesium oxide 400 (241.3 MG) MG tablet  Commonly known as:  MAG-OX  Take 1 tablet (400 mg total) by  mouth daily.     NIFEdipine 30 MG 24 hr tablet  Commonly known as:  PROCARDIA-XL/ADALAT CC  Take 1 tablet (30 mg total) by mouth daily.     pantoprazole 20 MG tablet  Commonly known as:  PROTONIX  Take 20 mg by mouth daily.     prenatal multivitamin Tabs tablet  Take 1 tablet by mouth daily with supper.        Wound Care: keep clean and dry  Postpartum Instructions: [facility] discharge booklet - instructions reviewed  Discharge to: Home  Follow up :  [facility] in 1 week for interval visit with CNM for BP recheck [facility] in 6 weeks for routine postpartum visit with Marlinda Mikeanya Bailey CNM  Signed: Marlinda Mike CNM, MSN, FACNM 03/02/2015, 11:44 AM

## 2015-03-02 NOTE — Progress Notes (Signed)
POSTOPERATIVE DAY # 4 S/P CS / PEC resolving - stable   S:         Reports feeling well and wants to go home             Tolerating po intake / no nausea / no vomiting / + flatus / no BM             Bleeding is light             Pain controlled with motrin and percocet             Up ad lib / ambulatory/ voiding QS  Newborn breast feeding    O:  VS: BP 145/87 mmHg  Pulse 79  Temp(Src) 98.4 F (36.9 C) (Oral)  Resp 20  Ht 5\' 3"  (1.6 m)  Wt 89.54 kg (197 lb 6.4 oz)  BMI 34.98 kg/m2  SpO2 98%  Breastfeeding? Unknown   LABS:              No results for input(s): WBC, HGB, PLT in the last 72 hours.             Bloodtype: --/--/O POS, O POS (04/28 2021)  Rubella: Immune (10/05 0000)                                  I&O not completed by nursing staff / weight down from comparison to 3 days ago             Physical Exam:             Alert and Oriented X3  Lungs: Clear and unlabored  Heart: regular rate and rhythm / no mumurs  Abdomen: soft, non-tender, non-distended             Fundus: firm, non-tender             Dressing honeycomb             Incision:  approximated with suture / no erythema / no ecchymosis / no drainage  Perineum: intact  Lochia: light  Extremities: 1+edema, no calf pain or tenderness  A:        POD # 4 S/P CS            PEC - stable BP on procardia - improving edema  P:        Routine postoperative care              Dc home - OV Monday with CNM for BP check and weight check / call over WE if any increase in BP or presentation of PIH symptoms - agrees     Marlinda MikeBAILEY,  CNM, MSN, [facility] 03/02/2015, 11:37 AM

## 2015-08-24 ENCOUNTER — Encounter: Payer: Self-pay | Admitting: Family Medicine

## 2015-08-24 ENCOUNTER — Ambulatory Visit (INDEPENDENT_AMBULATORY_CARE_PROVIDER_SITE_OTHER): Payer: BLUE CROSS/BLUE SHIELD | Admitting: Family Medicine

## 2015-08-24 VITALS — BP 113/75 | HR 72 | Temp 98.2°F | Ht 63.25 in | Wt 180.2 lb

## 2015-08-24 DIAGNOSIS — Z0000 Encounter for general adult medical examination without abnormal findings: Secondary | ICD-10-CM | POA: Diagnosis not present

## 2015-08-24 DIAGNOSIS — Z8632 Personal history of gestational diabetes: Secondary | ICD-10-CM | POA: Diagnosis not present

## 2015-08-24 LAB — POCT URINALYSIS DIPSTICK
Bilirubin, UA: NEGATIVE
Blood, UA: NEGATIVE
Glucose, UA: NEGATIVE
Ketones, UA: NEGATIVE
Leukocytes, UA: NEGATIVE
Nitrite, UA: NEGATIVE
Protein, UA: NEGATIVE
Spec Grav, UA: 1.015
Urobilinogen, UA: 0.2
pH, UA: 6

## 2015-08-24 NOTE — Progress Notes (Signed)
Pre visit review using our clinic review tool, if applicable. No additional management support is needed unless otherwise documented below in the visit note. 

## 2015-08-24 NOTE — Patient Instructions (Addendum)
Preventive Care for Adults, Female A healthy lifestyle and preventive care can promote health and wellness. Preventive health guidelines for women include the following key practices.  A routine yearly physical is a good way to check with your health care provider about your health and preventive screening. It is a chance to share any concerns and updates on your health and to receive a thorough exam.  Visit your dentist for a routine exam and preventive care every 6 months. Brush your teeth twice a day and floss once a day. Good oral hygiene prevents tooth decay and gum disease.  The frequency of eye exams is based on your age, health, family medical history, use of contact lenses, and other factors. Follow your health care provider's recommendations for frequency of eye exams.  Eat a healthy diet. Foods like vegetables, fruits, whole grains, low-fat dairy products, and lean protein foods contain the nutrients you need without too many calories. Decrease your intake of foods high in solid fats, added sugars, and salt. Eat the right amount of calories for you.Get information about a proper diet from your health care provider, if necessary.  Regular physical exercise is one of the most important things you can do for your health. Most adults should get at least 150 minutes of moderate-intensity exercise (any activity that increases your heart rate and causes you to sweat) each week. In addition, most adults need muscle-strengthening exercises on 2 or more days a week.  Maintain a healthy weight. The body mass index (BMI) is a screening tool to identify possible weight problems. It provides an estimate of body fat based on height and weight. Your health care provider can find your BMI and can help you achieve or maintain a healthy weight.For adults 20 years and older:  A BMI below 18.5 is considered underweight.  A BMI of 18.5 to 24.9 is normal.  A BMI of 25 to 29.9 is considered overweight.  A  BMI of 30 and above is considered obese.  Maintain normal blood lipids and cholesterol levels by exercising and minimizing your intake of saturated fat. Eat a balanced diet with plenty of fruit and vegetables. Blood tests for lipids and cholesterol should begin at age 20 and be repeated every 5 years. If your lipid or cholesterol levels are high, you are over 50, or you are at high risk for heart disease, you may need your cholesterol levels checked more frequently.Ongoing high lipid and cholesterol levels should be treated with medicines if diet and exercise are not working.  If you smoke, find out from your health care provider how to quit. If you do not use tobacco, do not start.  Lung cancer screening is recommended for adults aged 55-80 years who are at high risk for developing lung cancer because of a history of smoking. A yearly low-dose CT scan of the lungs is recommended for people who have at least a 30-pack-year history of smoking and are a current smoker or have quit within the past 15 years. A pack year of smoking is smoking an average of 1 pack of cigarettes a day for 1 year (for example: 1 pack a day for 30 years or 2 packs a day for 15 years). Yearly screening should continue until the smoker has stopped smoking for at least 15 years. Yearly screening should be stopped for people who develop a health problem that would prevent them from having lung cancer treatment.  If you are pregnant, do not drink alcohol. If you are   breastfeeding, be very cautious about drinking alcohol. If you are not pregnant and choose to drink alcohol, do not have more than 1 drink per day. One drink is considered to be 12 ounces (355 mL) of beer, 5 ounces (148 mL) of wine, or 1.5 ounces (44 mL) of liquor.  Avoid use of street drugs. Do not share needles with anyone. Ask for help if you need support or instructions about stopping the use of drugs.  High blood pressure causes heart disease and  increases the risk of stroke. Your blood pressure should be checked at least every 1 to 2 years. Ongoing high blood pressure should be treated with medicines if weight loss and exercise do not work.  If you are 25-78 years old, ask your health care provider if you should take aspirin to prevent strokes.  Diabetes screening is done by taking a blood sample to check your blood glucose level after you have not eaten for a certain period of time (fasting). If you are not overweight and you do not have risk factors for diabetes, you should be screened once every 3 years starting at age 86. If you are overweight or obese and you are 3-87 years of age, you should be screened for diabetes every year as part of your cardiovascular risk assessment.  Breast cancer screening is essential preventive care for women. You should practice "breast self-awareness." This means understanding the normal appearance and feel of your breasts and may include breast self-examination. Any changes detected, no matter how small, should be reported to a health care provider. Women in their 66s and 30s should have a clinical breast exam (CBE) by a health care provider as part of a regular health exam every 1 to 3 years. After age 43, women should have a CBE every year. Starting at age 37, women should consider having a mammogram (breast X-ray test) every year. Women who have a family history of breast cancer should talk to their health care provider about genetic screening. Women at a high risk of breast cancer should talk to their health care providers about having an MRI and a mammogram every year.  Breast cancer gene (BRCA)-related cancer risk assessment is recommended for women who have family members with BRCA-related cancers. BRCA-related cancers include breast, ovarian, tubal, and peritoneal cancers. Having family members with these cancers may be associated with an increased risk for harmful changes (mutations) in the breast  cancer genes BRCA1 and BRCA2. Results of the assessment will determine the need for genetic counseling and BRCA1 and BRCA2 testing.  Your health care provider may recommend that you be screened regularly for cancer of the pelvic organs (ovaries, uterus, and vagina). This screening involves a pelvic examination, including checking for microscopic changes to the surface of your cervix (Pap test). You may be encouraged to have this screening done every 3 years, beginning at age 78.  For women ages 79-65, health care providers may recommend pelvic exams and Pap testing every 3 years, or they may recommend the Pap and pelvic exam, combined with testing for human papilloma virus (HPV), every 5 years. Some types of HPV increase your risk of cervical cancer. Testing for HPV may also be done on women of any age with unclear Pap test results.  Other health care providers may not recommend any screening for nonpregnant women who are considered low risk for pelvic cancer and who do not have symptoms. Ask your health care provider if a screening pelvic exam is right for  you.  If you have had past treatment for cervical cancer or a condition that could lead to cancer, you need Pap tests and screening for cancer for at least 20 years after your treatment. If Pap tests have been discontinued, your risk factors (such as having a new sexual partner) need to be reassessed to determine if screening should resume. Some women have medical problems that increase the chance of getting cervical cancer. In these cases, your health care provider may recommend more frequent screening and Pap tests.  Colorectal cancer can be detected and often prevented. Most routine colorectal cancer screening begins at the age of 56 years and continues through age 31 years. However, your health care provider may recommend screening at an earlier age if you have risk factors for colon cancer. On a yearly basis, your health care provider may provide  home test kits to check for hidden blood in the stool. Use of a small camera at the end of a tube, to directly examine the colon (sigmoidoscopy or colonoscopy), can detect the earliest forms of colorectal cancer. Talk to your health care provider about this at age 31, when routine screening begins. Direct exam of the colon should be repeated every 5-10 years through age 69 years, unless early forms of precancerous polyps or small growths are found.  People who are at an increased risk for hepatitis B should be screened for this virus. You are considered at high risk for hepatitis B if:  You were born in a country where hepatitis B occurs often. Talk with your health care provider about which countries are considered high risk.  Your parents were born in a high-risk country and you have not received a shot to protect against hepatitis B (hepatitis B vaccine).  You have HIV or AIDS.  You use needles to inject street drugs.  You live with, or have sex with, someone who has hepatitis B.  You get hemodialysis treatment.  You take certain medicines for conditions like cancer, organ transplantation, and autoimmune conditions.  Hepatitis C blood testing is recommended for all people born from [birth year] through 1965 and any individual with known risks for hepatitis C.  Practice safe sex. Use condoms and avoid high-risk sexual practices to reduce the spread of sexually transmitted infections (STIs). STIs include gonorrhea, chlamydia, syphilis, trichomonas, herpes, HPV, and human immunodeficiency virus (HIV). Herpes, HIV, and HPV are viral illnesses that have no cure. They can result in disability, cancer, and death.  You should be screened for sexually transmitted illnesses (STIs) including gonorrhea and chlamydia if:  You are sexually active and are younger than 24 years.  You are older than 24 years and your health care provider tells you that you are at risk for this type of infection.  Your sexual  activity has changed since you were last screened and you are at an increased risk for chlamydia or gonorrhea. Ask your health care provider if you are at risk.  If you are at risk of being infected with HIV, it is recommended that you take a prescription medicine daily to prevent HIV infection. This is called preexposure prophylaxis (PrEP). You are considered at risk if:  You are sexually active and do not regularly use condoms or know the HIV status of your partner(s).  You take drugs by injection.  You are sexually active with a partner who has HIV.  Talk with your health care provider about whether you are at high risk of being infected with HIV. If  you choose to begin PrEP, you should first be tested for HIV. You should then be tested every 3 months for as long as you are taking PrEP.  Osteoporosis is a disease in which the bones lose minerals and strength with aging. This can result in serious bone fractures or breaks. The risk of osteoporosis can be identified using a bone density scan. Women ages 1 years and over and women at risk for fractures or osteoporosis should discuss screening with their health care providers. Ask your health care provider whether you should take a calcium supplement or vitamin D to reduce the rate of osteoporosis.  Menopause can be associated with physical symptoms and risks. Hormone replacement therapy is available to decrease symptoms and risks. You should talk to your health care provider about whether hormone replacement therapy is right for you.  Use sunscreen. Apply sunscreen liberally and repeatedly throughout the day. You should seek shade when your shadow is shorter than you. Protect yourself by wearing long sleeves, pants, a wide-brimmed hat, and sunglasses year round, whenever you are outdoors.  Once a month, do a whole body skin exam, using a mirror to look at the skin on your back. Tell your health care provider of new moles, moles that have irregular  borders, moles that are larger than a pencil eraser, or moles that have changed in shape or color.  Stay current with required vaccines (immunizations).  Influenza vaccine. All adults should be immunized every year.  Tetanus, diphtheria, and acellular pertussis (Td, Tdap) vaccine. Pregnant women should receive 1 dose of Tdap vaccine during each pregnancy. The dose should be obtained regardless of the length of time since the last dose. Immunization is preferred during the 27th-36th week of gestation. An adult who has not previously received Tdap or who does not know her vaccine status should receive 1 dose of Tdap. This initial dose should be followed by tetanus and diphtheria toxoids (Td) booster doses every 10 years. Adults with an unknown or incomplete history of completing a 3-dose immunization series with Td-containing vaccines should begin or complete a primary immunization series including a Tdap dose. Adults should receive a Td booster every 10 years.  Varicella vaccine. An adult without evidence of immunity to varicella should receive 2 doses or a second dose if she has previously received 1 dose. Pregnant females who do not have evidence of immunity should receive the first dose after pregnancy. This first dose should be obtained before leaving the health care facility. The second dose should be obtained 4-8 weeks after the first dose.  Human papillomavirus (HPV) vaccine. Females aged 13-26 years who have not received the vaccine previously should obtain the 3-dose series. The vaccine is not recommended for use in pregnant females. However, pregnancy testing is not needed before receiving a dose. If a female is found to be pregnant after receiving a dose, no treatment is needed. In that case, the remaining doses should be delayed until after the pregnancy. Immunization is recommended for any person with an immunocompromised condition through the age of 24 years if she did not get any or all doses  earlier. During the 3-dose series, the second dose should be obtained 4-8 weeks after the first dose. The third dose should be obtained 24 weeks after the first dose and 16 weeks after the second dose.  Zoster vaccine. One dose is recommended for adults aged 97 years or older unless certain conditions are present.  Measles, mumps, and rubella (MMR) vaccine. Adults born  before 1957 generally are considered immune to measles and mumps. Adults born in [birth year] or later should have 1 or more doses of MMR vaccine unless there is a contraindication to the vaccine or there is laboratory evidence of immunity to each of the three diseases. A routine second dose of MMR vaccine should be obtained at least 28 days after the first dose for students attending postsecondary schools, health care workers, or international travelers. People who received inactivated measles vaccine or an unknown type of measles vaccine during 1963-1967 should receive 2 doses of MMR vaccine. People who received inactivated mumps vaccine or an unknown type of mumps vaccine before 1979 and are at high risk for mumps infection should consider immunization with 2 doses of MMR vaccine. For females of childbearing age, rubella immunity should be determined. If there is no evidence of immunity, females who are not pregnant should be vaccinated. If there is no evidence of immunity, females who are pregnant should delay immunization until after pregnancy. Unvaccinated health care workers born before [birth year] who lack laboratory evidence of measles, mumps, or rubella immunity or laboratory confirmation of disease should consider measles and mumps immunization with 2 doses of MMR vaccine or rubella immunization with 1 dose of MMR vaccine.  Pneumococcal 13-valent conjugate (PCV13) vaccine. When indicated, a person who is uncertain of his immunization history and has no record of immunization should receive the PCV13 vaccine. All adults 61 years of age and older  should receive this vaccine. An adult aged 92 years or older who has certain medical conditions and has not been previously immunized should receive 1 dose of PCV13 vaccine. This PCV13 should be followed with a dose of pneumococcal polysaccharide (PPSV23) vaccine. Adults who are at high risk for pneumococcal disease should obtain the PPSV23 vaccine at least 8 weeks after the dose of PCV13 vaccine. Adults older than 30 years of age who have normal immune system function should obtain the PPSV23 vaccine dose at least 1 year after the dose of PCV13 vaccine.  Pneumococcal polysaccharide (PPSV23) vaccine. When PCV13 is also indicated, PCV13 should be obtained first. All adults aged 2 years and older should be immunized. An adult younger than age 30 years who has certain medical conditions should be immunized. Any person who resides in a nursing home or long-term care facility should be immunized. An adult smoker should be immunized. People with an immunocompromised condition and certain other conditions should receive both PCV13 and PPSV23 vaccines. People with human immunodeficiency virus (HIV) infection should be immunized as soon as possible after diagnosis. Immunization during chemotherapy or radiation therapy should be avoided. Routine use of PPSV23 vaccine is not recommended for American Indians, [place] Natives, or people younger than 65 years unless there are medical conditions that require PPSV23 vaccine. When indicated, people who have unknown immunization and have no record of immunization should receive PPSV23 vaccine. One-time revaccination 5 years after the first dose of PPSV23 is recommended for people aged 19-64 years who have chronic kidney failure, nephrotic syndrome, asplenia, or immunocompromised conditions. People who received 1-2 doses of PPSV23 before age 44 years should receive another dose of PPSV23 vaccine at age 83 years or later if at least 5 years have passed since the previous dose. Doses  of PPSV23 are not needed for people immunized with PPSV23 at or after age 20 years.  Meningococcal vaccine. Adults with asplenia or persistent complement component deficiencies should receive 2 doses of quadrivalent meningococcal conjugate (MenACWY-D) vaccine. The doses should be obtained  at least 2 months apart. Microbiologists working with certain meningococcal bacteria, [place] recruits, people at risk during an outbreak, and people who travel to or live in countries with a high rate of meningitis should be immunized. A first-year college student up through age 28 years who is living in a residence hall should receive a dose if she did not receive a dose on or after her 16th birthday. Adults who have certain high-risk conditions should receive one or more doses of vaccine.  Hepatitis A vaccine. Adults who wish to be protected from this disease, have certain high-risk conditions, work with hepatitis A-infected animals, work in hepatitis A research labs, or travel to or work in countries with a high rate of hepatitis A should be immunized. Adults who were previously unvaccinated and who anticipate close contact with an international adoptee during the first 60 days after arrival in the [place] from a country with a high rate of hepatitis A should be immunized.  Hepatitis B vaccine. Adults who wish to be protected from this disease, have certain high-risk conditions, may be exposed to blood or other infectious body fluids, are household contacts or sex partners of hepatitis B positive people, are clients or workers in certain care facilities, or travel to or work in countries with a high rate of hepatitis B should be immunized.  Haemophilus influenzae type b (Hib) vaccine. A previously unvaccinated person with asplenia or sickle cell disease or having a scheduled splenectomy should receive 1 dose of Hib vaccine. Regardless of previous immunization, a recipient of a hematopoietic stem cell transplant  should receive a 3-dose series 6-12 months after her successful transplant. Hib vaccine is not recommended for adults with HIV infection. Preventive Services / Frequency Ages 71 to 87 years  Blood pressure check.** / Every 3-5 years.  Lipid and cholesterol check.** / Every 5 years beginning at age 1.  Clinical breast exam.** / Every 3 years for women in their 3s and 31s.  BRCA-related cancer risk assessment.** / For women who have family members with a BRCA-related cancer (breast, ovarian, tubal, or peritoneal cancers).  Pap test.** / Every 2 years from ages 50 through 86. Every 3 years starting at age 87 through age 7 or 75 with a history of 3 consecutive normal Pap tests.  HPV screening.** / Every 3 years from ages 59 through ages 35 to 6 with a history of 3 consecutive normal Pap tests.  Hepatitis C blood test.** / For any individual with known risks for hepatitis C.  Skin self-exam. / Monthly.  Influenza vaccine. / Every year.  Tetanus, diphtheria, and acellular pertussis (Tdap, Td) vaccine.** / Consult your health care provider. Pregnant women should receive 1 dose of Tdap vaccine during each pregnancy. 1 dose of Td every 10 years.  Varicella vaccine.** / Consult your health care provider. Pregnant females who do not have evidence of immunity should receive the first dose after pregnancy.  HPV vaccine. / 3 doses over 6 months, if 72 and younger. The vaccine is not recommended for use in pregnant females. However, pregnancy testing is not needed before receiving a dose.  Measles, mumps, rubella (MMR) vaccine.** / You need at least 1 dose of MMR if you were born in 1957 or later. You may also need a 2nd dose. For females of childbearing age, rubella immunity should be determined. If there is no evidence of immunity, females who are not pregnant should be vaccinated. If there is no evidence of immunity, females who are  pregnant should delay immunization until after  pregnancy.  Pneumococcal 13-valent conjugate (PCV13) vaccine.** / Consult your health care provider.  Pneumococcal polysaccharide (PPSV23) vaccine.** / 1 to 2 doses if you smoke cigarettes or if you have certain conditions.  Meningococcal vaccine.** / 1 dose if you are age 87 to 44 years and a Market researcher living in a residence hall, or have one of several medical conditions, you need to get vaccinated against meningococcal disease. You may also need additional booster doses.  Hepatitis A vaccine.** / Consult your health care provider.  Hepatitis B vaccine.** / Consult your health care provider.  Haemophilus influenzae type b (Hib) vaccine.** / Consult your health care provider. Ages 86 to 38 years  Blood pressure check.** / Every year.  Lipid and cholesterol check.** / Every 5 years beginning at age 49 years.  Lung cancer screening. / Every year if you are aged 71-80 years and have a 30-pack-year history of smoking and currently smoke or have quit within the past 15 years. Yearly screening is stopped once you have quit smoking for at least 15 years or develop a health problem that would prevent you from having lung cancer treatment.  Clinical breast exam.** / Every year after age 51 years.  BRCA-related cancer risk assessment.** / For women who have family members with a BRCA-related cancer (breast, ovarian, tubal, or peritoneal cancers).  Mammogram.** / Every year beginning at age 18 years and continuing for as long as you are in good health. Consult with your health care provider.  Pap test.** / Every 3 years starting at age 63 years through age 37 or 57 years with a history of 3 consecutive normal Pap tests.  HPV screening.** / Every 3 years from ages 41 years through ages 76 to 23 years with a history of 3 consecutive normal Pap tests.  Fecal occult blood test (FOBT) of stool. / Every year beginning at age 36 years and continuing until age 51 years. You may not need  to do this test if you get a colonoscopy every 10 years.  Flexible sigmoidoscopy or colonoscopy.** / Every 5 years for a flexible sigmoidoscopy or every 10 years for a colonoscopy beginning at age 36 years and continuing until age 35 years.  Hepatitis C blood test.** / For all people born from [birth year] through 1966 and any individual with known risks for hepatitis C.  Skin self-exam. / Monthly.  Influenza vaccine. / Every year.  Tetanus, diphtheria, and acellular pertussis (Tdap/Td) vaccine.** / Consult your health care provider. Pregnant women should receive 1 dose of Tdap vaccine during each pregnancy. 1 dose of Td every 10 years.  Varicella vaccine.** / Consult your health care provider. Pregnant females who do not have evidence of immunity should receive the first dose after pregnancy.  Zoster vaccine.** / 1 dose for adults aged 73 years or older.  Measles, mumps, rubella (MMR) vaccine.** / You need at least 1 dose of MMR if you were born in 1957 or later. You may also need a second dose. For females of childbearing age, rubella immunity should be determined. If there is no evidence of immunity, females who are not pregnant should be vaccinated. If there is no evidence of immunity, females who are pregnant should delay immunization until after pregnancy.  Pneumococcal 13-valent conjugate (PCV13) vaccine.** / Consult your health care provider.  Pneumococcal polysaccharide (PPSV23) vaccine.** / 1 to 2 doses if you smoke cigarettes or if you have certain conditions.  Meningococcal vaccine.** /  Consult your health care provider.  Hepatitis A vaccine.** / Consult your health care provider.  Hepatitis B vaccine.** / Consult your health care provider.  Haemophilus influenzae type b (Hib) vaccine.** / Consult your health care provider. Ages 80 years and over  Blood pressure check.** / Every year.  Lipid and cholesterol check.** / Every 5 years beginning at age 62 years.  Lung cancer  screening. / Every year if you are aged 32-80 years and have a 30-pack-year history of smoking and currently smoke or have quit within the past 15 years. Yearly screening is stopped once you have quit smoking for at least 15 years or develop a health problem that would prevent you from having lung cancer treatment.  Clinical breast exam.** / Every year after age 61 years.  BRCA-related cancer risk assessment.** / For women who have family members with a BRCA-related cancer (breast, ovarian, tubal, or peritoneal cancers).  Mammogram.** / Every year beginning at age 39 years and continuing for as long as you are in good health. Consult with your health care provider.  Pap test.** / Every 3 years starting at age 85 years through age 74 or 72 years with 3 consecutive normal Pap tests. Testing can be stopped between 65 and 70 years with 3 consecutive normal Pap tests and no abnormal Pap or HPV tests in the past 10 years.  HPV screening.** / Every 3 years from ages 55 years through ages 67 or 77 years with a history of 3 consecutive normal Pap tests. Testing can be stopped between 65 and 70 years with 3 consecutive normal Pap tests and no abnormal Pap or HPV tests in the past 10 years.  Fecal occult blood test (FOBT) of stool. / Every year beginning at age 81 years and continuing until age 22 years. You may not need to do this test if you get a colonoscopy every 10 years.  Flexible sigmoidoscopy or colonoscopy.** / Every 5 years for a flexible sigmoidoscopy or every 10 years for a colonoscopy beginning at age 67 years and continuing until age 22 years.  Hepatitis C blood test.** / For all people born from [birth year] through 1966 and any individual with known risks for hepatitis C.  Osteoporosis screening.** / A one-time screening for women ages 8 years and over and women at risk for fractures or osteoporosis.  Skin self-exam. / Monthly.  Influenza vaccine. / Every year.  Tetanus, diphtheria, and  acellular pertussis (Tdap/Td) vaccine.** / 1 dose of Td every 10 years.  Varicella vaccine.** / Consult your health care provider.  Zoster vaccine.** / 1 dose for adults aged 56 years or older.  Pneumococcal 13-valent conjugate (PCV13) vaccine.** / Consult your health care provider.  Pneumococcal polysaccharide (PPSV23) vaccine.** / 1 dose for all adults aged 15 years and older.  Meningococcal vaccine.** / Consult your health care provider.  Hepatitis A vaccine.** / Consult your health care provider.  Hepatitis B vaccine.** / Consult your health care provider.  Haemophilus influenzae type b (Hib) vaccine.** / Consult your health care provider. ** Family history and personal history of risk and conditions may change your health care provider's recommendations.   This information is not intended to replace advice given to you by your health care provider. Make sure you discuss any questions you have with your health care provider.   Document Released: 12/10/2001 Document Revised: 11/04/2014 Document Reviewed: 03/11/2011 Elsevier Interactive Patient Education [employer].

## 2015-08-24 NOTE — Progress Notes (Signed)
Subjective:     Karla Huber is a 29 y.o. female and is here for a comprehensive physical exam. The patient reports no problems.  Social History   Social History  . Marital Status: Married    Spouse Name: N/A  . Number of Children: N/A  . Years of Education: N/A   Occupational History  . Not on file.   Social History Main Topics  . Smoking status: Never Smoker   . Smokeless tobacco: Never Used  . Alcohol Use: No  . Drug Use: No  . Sexual Activity: Yes    Birth Control/ Protection: None   Other Topics Concern  . Not on file   Social History Narrative   Health Maintenance  Topic Date Due  . Janet Berlin  10/29/2015  . INFLUENZA VACCINE  05/28/2016  . PAP SMEAR  06/16/2016  . HIV Screening  Completed    The following portions of the patient's history were reviewed and updated as appropriate:  She  has a past medical history of Gestational diabetes; Traumatic injury during pregnancy in third trimester (02/09/2015); varicella; Asthma; Hypertension; Headache; Postpartum care following cesarean delivery (4/30) (02/25/2015); and GERD (gastroesophageal reflux disease). She  does not have any pertinent problems on file. She  has past surgical history that includes Tonsillectomy; Foot surgery; Knee surgery; and Cesarean section (N/A, 02/25/2015). Her family history includes Arthritis in her sister; Cancer in her cousin; Heart disease in her mother; Hypertension in her maternal grandmother. She  reports that she has never smoked. She has never used smokeless tobacco. She reports that she does not drink alcohol or use illicit drugs. She has a current medication list which includes the following prescription(s): albuterol, budesonide, cetirizine, and prenatal multivitamin. Current Outpatient Prescriptions on File Prior to Visit  Medication Sig Dispense Refill  . albuterol (PROVENTIL HFA;VENTOLIN HFA) 108 (90 BASE) MCG/ACT inhaler Inhale 2 puffs into the lungs every 6 (six) hours as  needed for wheezing or shortness of breath.    . budesonide (PULMICORT) 180 MCG/ACT inhaler Inhale 2 puffs into the lungs 2 (two) times daily.    . cetirizine (ZYRTEC) 10 MG tablet Take 10 mg by mouth daily. Used in Spring & Fall, for seasonal allergies.    . Prenatal Vit-Fe Fumarate-FA (PRENATAL MULTIVITAMIN) TABS tablet Take 1 tablet by mouth daily with supper.     No current facility-administered medications on file prior to visit.   She is allergic to amoxicillin; hydrocodone; and tdap..  Review of Systems Review of Systems  Constitutional: Negative for activity change, appetite change and fatigue.  HENT: Negative for hearing loss, congestion, tinnitus and ear discharge.  dentist q38m Eyes: Negative for visual disturbance (see optho q1y -- vision corrected to 20/20 with contacts).  Respiratory: Negative for cough, chest tightness and shortness of breath.   Cardiovascular: Negative for chest pain, palpitations and leg swelling.  Gastrointestinal: Negative for abdominal pain, diarrhea, constipation and abdominal distention.  Genitourinary: Negative for urgency, frequency, decreased urine volume and difficulty urinating.  Musculoskeletal: Negative for back pain, arthralgias and gait problem.  Skin: Negative for color change, pallor and rash.  Neurological: Negative for dizziness, light-headedness, numbness and headaches.  Hematological: Negative for adenopathy. Does not bruise/bleed easily.  Psychiatric/Behavioral: Negative for suicidal ideas, confusion, sleep disturbance, self-injury, dysphoric mood, decreased concentration and agitation.      Objective:    BP 113/75 mmHg  Pulse 72  Temp(Src) 98.2 F (36.8 C) (Oral)  Ht 5' 3.25" (1.607 m)  Wt 180 lb 3.2 oz (  81.738 kg)  BMI 31.65 kg/m2  SpO2 98%  LMP 08/04/2015  Breastfeeding? Yes General appearance: alert, cooperative, appears stated age and no distress Head: Normocephalic, without obvious abnormality, atraumatic Eyes:  conjunctivae/corneas clear. PERRL, EOM's intact. Fundi benign. Ears: normal TM's and external ear canals both ears Nose: Nares normal. Septum midline. Mucosa normal. No drainage or sinus tenderness. Throat: lips, mucosa, and tongue normal; teeth and gums normal Neck: no adenopathy, no carotid bruit, no JVD, supple, symmetrical, trachea midline and thyroid not enlarged, symmetric, no tenderness/mass/nodules Back: symmetric, no curvature. ROM normal. No CVA tenderness. Lungs: clear to auscultation bilaterally Breasts: gyn Heart: regular rate and rhythm, S1, S2 normal, no murmur, click, rub or gallop Abdomen: soft, non-tender; bowel sounds normal; no masses,  no organomegaly Pelvic: deferred--gyn Extremities: extremities normal, atraumatic, no cyanosis or edema Pulses: 2+ and symmetric Skin: Skin color, texture, turgor normal. No rashes or lesions Lymph nodes: Cervical, supraclavicular, and axillary nodes normal. Neurologic: Alert and oriented X 3, normal strength and tone. Normal symmetric reflexes. Normal coordination and gait Psych- no depression, no anxiety      Assessment:    Healthy female exam.      Plan:    ghm utd Check labs See After Visit Summary for Counseling Recommendations

## 2015-08-25 LAB — CBC WITH DIFFERENTIAL/PLATELET
Basophils Absolute: 0 10*3/uL (ref 0.0–0.1)
Basophils Relative: 0.1 % (ref 0.0–3.0)
Eosinophils Absolute: 0.1 10*3/uL (ref 0.0–0.7)
Eosinophils Relative: 0.9 % (ref 0.0–5.0)
HCT: 39.2 % (ref 36.0–46.0)
Hemoglobin: 13.3 g/dL (ref 12.0–15.0)
Lymphocytes Relative: 19.9 % (ref 12.0–46.0)
Lymphs Abs: 2.2 10*3/uL (ref 0.7–4.0)
MCHC: 33.8 g/dL (ref 30.0–36.0)
MCV: 88.6 fl (ref 78.0–100.0)
Monocytes Absolute: 0.7 10*3/uL (ref 0.1–1.0)
Monocytes Relative: 6.4 % (ref 3.0–12.0)
Neutro Abs: 8.1 10*3/uL — ABNORMAL HIGH (ref 1.4–7.7)
Neutrophils Relative %: 72.7 % (ref 43.0–77.0)
Platelets: 383 10*3/uL (ref 150.0–400.0)
RBC: 4.42 Mil/uL (ref 3.87–5.11)
RDW: 13.4 % (ref 11.5–15.5)
WBC: 11.2 10*3/uL — ABNORMAL HIGH (ref 4.0–10.5)

## 2015-08-25 LAB — COMPREHENSIVE METABOLIC PANEL
ALT: 15 U/L (ref 0–35)
AST: 14 U/L (ref 0–37)
Albumin: 4.7 g/dL (ref 3.5–5.2)
Alkaline Phosphatase: 83 U/L (ref 39–117)
BUN: 9 mg/dL (ref 6–23)
CO2: 25 mEq/L (ref 19–32)
Calcium: 10.1 mg/dL (ref 8.4–10.5)
Chloride: 104 mEq/L (ref 96–112)
Creatinine, Ser: 0.72 mg/dL (ref 0.40–1.20)
GFR: 100.97 mL/min (ref >60.00)
Glucose, Bld: 85 mg/dL (ref 70–99)
Potassium: 4.1 mEq/L (ref 3.5–5.1)
Sodium: 140 mEq/L (ref 135–145)
Total Bilirubin: 0.4 mg/dL (ref 0.2–1.2)
Total Protein: 8 g/dL (ref 6.0–8.3)

## 2015-08-25 LAB — LIPID PANEL
Cholesterol: 166 mg/dL (ref 0–200)
HDL: 66.7 mg/dL (ref >39.00)
LDL Cholesterol: 76 mg/dL (ref 0–99)
NonHDL: 99.2
Total CHOL/HDL Ratio: 2
Triglycerides: 118 mg/dL (ref 0.0–149.0)
VLDL: 23.6 mg/dL (ref 0.0–40.0)

## 2015-08-25 LAB — TSH: TSH: 1.27 u[IU]/mL (ref 0.35–4.50)

## 2015-08-25 LAB — HEMOGLOBIN A1C: Hgb A1c MFr Bld: 5.4 % (ref 4.6–6.5)

## 2015-08-28 ENCOUNTER — Encounter: Payer: Self-pay | Admitting: Family Medicine

## 2015-08-29 ENCOUNTER — Other Ambulatory Visit: Payer: Self-pay

## 2015-08-29 NOTE — Telephone Encounter (Signed)
Lets check a peripheral smear first

## 2015-08-30 ENCOUNTER — Other Ambulatory Visit (INDEPENDENT_AMBULATORY_CARE_PROVIDER_SITE_OTHER): Payer: BLUE CROSS/BLUE SHIELD

## 2015-08-30 DIAGNOSIS — D72829 Elevated white blood cell count, unspecified: Secondary | ICD-10-CM | POA: Diagnosis not present

## 2015-08-31 LAB — CBC WITH DIFFERENTIAL/PLATELET
Basophils Absolute: 0 10*3/uL (ref 0.0–0.1)
Basophils Relative: 0.1 % (ref 0.0–3.0)
Eosinophils Absolute: 0.1 10*3/uL (ref 0.0–0.7)
Eosinophils Relative: 1.1 % (ref 0.0–5.0)
HCT: 38.9 % (ref 36.0–46.0)
Hemoglobin: 12.9 g/dL (ref 12.0–15.0)
Lymphocytes Relative: 19.6 % (ref 12.0–46.0)
Lymphs Abs: 2.2 10*3/uL (ref 0.7–4.0)
MCHC: 33.3 g/dL (ref 30.0–36.0)
MCV: 89.2 fl (ref 78.0–100.0)
Monocytes Absolute: 0.6 10*3/uL (ref 0.1–1.0)
Monocytes Relative: 5.1 % (ref 3.0–12.0)
Neutro Abs: 8.2 10*3/uL — ABNORMAL HIGH (ref 1.4–7.7)
Neutrophils Relative %: 74.1 % (ref 43.0–77.0)
Platelets: 390 10*3/uL (ref 150.0–400.0)
RBC: 4.36 Mil/uL (ref 3.87–5.11)
RDW: 13.6 % (ref 11.5–15.5)
WBC: 11 10*3/uL — ABNORMAL HIGH (ref 4.0–10.5)

## 2015-11-09 ENCOUNTER — Ambulatory Visit: Payer: BLUE CROSS/BLUE SHIELD | Admitting: Family Medicine

## 2016-01-07 ENCOUNTER — Other Ambulatory Visit: Payer: Self-pay | Admitting: Allergy and Immunology

## 2016-03-18 DIAGNOSIS — Z3201 Encounter for pregnancy test, result positive: Secondary | ICD-10-CM | POA: Diagnosis not present

## 2016-03-18 DIAGNOSIS — Z3A01 Less than 8 weeks gestation of pregnancy: Secondary | ICD-10-CM | POA: Diagnosis not present

## 2016-03-18 DIAGNOSIS — O09291 Supervision of pregnancy with other poor reproductive or obstetric history, first trimester: Secondary | ICD-10-CM | POA: Diagnosis not present

## 2016-03-20 DIAGNOSIS — O09299 Supervision of pregnancy with other poor reproductive or obstetric history, unspecified trimester: Secondary | ICD-10-CM | POA: Diagnosis not present

## 2016-03-20 DIAGNOSIS — Z3A01 Less than 8 weeks gestation of pregnancy: Secondary | ICD-10-CM | POA: Diagnosis not present

## 2016-03-22 ENCOUNTER — Encounter: Payer: Self-pay | Admitting: Family Medicine

## 2016-03-22 ENCOUNTER — Ambulatory Visit (INDEPENDENT_AMBULATORY_CARE_PROVIDER_SITE_OTHER): Payer: BLUE CROSS/BLUE SHIELD | Admitting: Family Medicine

## 2016-03-22 VITALS — BP 110/72 | HR 84 | Temp 99.1°F | Ht 63.0 in | Wt 173.6 lb

## 2016-03-22 DIAGNOSIS — R0602 Shortness of breath: Secondary | ICD-10-CM

## 2016-03-22 DIAGNOSIS — K219 Gastro-esophageal reflux disease without esophagitis: Secondary | ICD-10-CM | POA: Diagnosis not present

## 2016-03-22 DIAGNOSIS — O09299 Supervision of pregnancy with other poor reproductive or obstetric history, unspecified trimester: Secondary | ICD-10-CM | POA: Diagnosis not present

## 2016-03-22 DIAGNOSIS — Z3A01 Less than 8 weeks gestation of pregnancy: Secondary | ICD-10-CM | POA: Diagnosis not present

## 2016-03-22 MED ORDER — PANTOPRAZOLE SODIUM 40 MG PO TBEC: 40.0000 mg | DELAYED_RELEASE_TABLET | Freq: Every day | ORAL | Status: DC

## 2016-03-22 NOTE — Progress Notes (Signed)
Patient ID: Karla BeetsRebekah Huber, female    DOB: 08/01/1986  Age: 30 y.o. MRN: [id number]    Subjective:  Subjective HPI Karla BeetsRebekah Huber presents for c/o of gerd with pregnancy. She had to be on protonix last time. She also is requesting an echo. Her dad was dx with MR - HE only had sob and tacycardia.  His cvts told pt to get and echo because it was hereditary.   Review of Systems  Constitutional: Negative for diaphoresis, appetite change, fatigue and unexpected weight change.  Eyes: Negative for pain, redness and visual disturbance.  Respiratory: Positive for shortness of breath. Negative for cough, chest tightness and wheezing.   Cardiovascular: Negative for chest pain, palpitations and leg swelling.  Endocrine: Negative for cold intolerance, heat intolerance, polydipsia, polyphagia and polyuria.  Genitourinary: Negative for dysuria, frequency and difficulty urinating.  Neurological: Negative for dizziness, light-headedness, numbness and headaches.    History Past Medical History  Diagnosis Date  . Gestational diabetes     diet controlled  . Traumatic injury during pregnancy in third trimester 02/09/2015  . Hx of varicella   . Asthma     pulmocort daily  . Hypertension     pre-eclampsia  . Headache   . Postpartum care following cesarean delivery (4/30) 02/25/2015  . GERD (gastroesophageal reflux disease)     She has past surgical history that includes Tonsillectomy; Foot surgery; Knee surgery; and Cesarean section (N/A, 02/25/2015).   Her family history includes Arthritis in her sister; Cancer in her cousin; Heart disease in her mother; Hypertension in her maternal grandmother.She reports that she has never smoked. She has never used smokeless tobacco. She reports that she does not drink alcohol or use illicit drugs.  Current Outpatient Prescriptions on File Prior to Visit  Medication Sig Dispense Refill  . albuterol (PROVENTIL HFA;VENTOLIN HFA) 108 (90 BASE) MCG/ACT inhaler Inhale  2 puffs into the lungs every 6 (six) hours as needed for wheezing or shortness of breath.    . cetirizine (ZYRTEC) 10 MG tablet Take 10 mg by mouth daily. Used in Spring & Fall, for seasonal allergies.    . Prenatal Vit-Fe Fumarate-FA (PRENATAL MULTIVITAMIN) TABS tablet Take 1 tablet by mouth daily with supper.    Marland Kitchen. PULMICORT FLEXHALER 180 MCG/ACT inhaler USE TWO PUFFS TWICE DAILY TO PREVENT COUGH OR WHEEZE, RINSE GARGLE AND SPIT AFTER USE. 3 each 1   No current facility-administered medications on file prior to visit.     Objective:  Objective Physical Exam  Constitutional: She is oriented to person, place, and time. She appears well-developed and well-nourished.  HENT:  Head: Normocephalic and atraumatic.  Eyes: Conjunctivae and EOM are normal.  Neck: Normal range of motion. Neck supple. No JVD present. Carotid bruit is not present. No thyromegaly present.  Cardiovascular: Normal rate, regular rhythm and normal heart sounds.   No murmur heard. Pulmonary/Chest: Effort normal and breath sounds normal. No respiratory distress. She has no wheezes. She has no rales. She exhibits no tenderness.  Musculoskeletal: She exhibits no edema.  Neurological: She is alert and oriented to person, place, and time.  Psychiatric: She has a normal mood and affect. Her behavior is normal. Judgment and thought content normal.  Nursing note and vitals reviewed.  BP 110/72 mmHg  Pulse 84  Temp(Src) 99.1 F (37.3 C) (Oral)  Ht 5\' 3"  (1.6 m)  Wt 173 lb 9.6 oz (78.744 kg)  BMI 30.76 kg/m2  SpO2 99%  LMP 02/24/2016 Wt Readings from Last 3 Encounters:  03/22/16 173 lb 9.6 oz (78.744 kg)  08/24/15 180 lb 3.2 oz (81.738 kg)  03/02/15 197 lb 6.4 oz (89.54 kg)     Lab Results  Component Value Date   WBC 11.0* 08/30/2015   HGB 12.9 08/30/2015   HCT 38.9 08/30/2015   PLT 390.0 08/30/2015   GLUCOSE 85 08/24/2015   CHOL 166 08/24/2015   TRIG 118.0 08/24/2015   HDL 66.70 08/24/2015   LDLCALC 76  08/24/2015   ALT 15 08/24/2015   AST 14 08/24/2015   NA 140 08/24/2015   K 4.1 08/24/2015   CL 104 08/24/2015   CREATININE 0.72 08/24/2015   BUN 9 08/24/2015   CO2 25 08/24/2015   TSH 1.27 08/24/2015   HGBA1C 5.4 08/24/2015    No results found.   Assessment & Plan:  Plan I have discontinued Ms. Rezek's omeprazole. I am also having her start on pantoprazole. Additionally, I am having her maintain her cetirizine, prenatal multivitamin, albuterol, and PULMICORT FLEXHALER.  Meds ordered this encounter  Medications  . DISCONTD: omeprazole (PRILOSEC OTC) 20 MG tablet    Sig: Take 20 mg by mouth daily.  . pantoprazole (PROTONIX) 40 MG tablet    Sig: Take 1 tablet (40 mg total) by mouth daily.    Dispense:  90 tablet    Refill:  3    Problem List Items Addressed This Visit    None    Visit Diagnoses    Gastroesophageal reflux disease, esophagitis presence not specified    -  Primary    Relevant Medications    pantoprazole (PROTONIX) 40 MG tablet    SOB (shortness of breath)        Relevant Orders    ECHOCARDIOGRAM COMPLETE       Follow-up: Return if symptoms worsen or fail to improve.  Donato Schultz, DO

## 2016-03-22 NOTE — Progress Notes (Signed)
Pre visit review using our clinic review tool, if applicable. No additional management support is needed unless otherwise documented below in the visit note. 

## 2016-03-22 NOTE — Patient Instructions (Signed)
Mitral Valve Regurgitation Mitral valve regurgitation, also called mitral regurgitation, is when blood leaks from the mitral valve. The mitral valve is located between the upper left chamber of the heart (left atrium) and the lower left chamber of the heart (left ventricle). Normally, this valve opens when the atrium pumps blood into the ventricle, and it closes when the ventricle pumps blood out to the body. Mitral valve regurgitation happens when the mitral valve does not close properly. As a result, blood in the ventricle leaks back into the atrium. Mitral valve regurgitation causes the heart to work harder to pump blood. Over time, this can lead to heart failure. CAUSES  Causes of mitral valve regurgitation include:  Damage to the mitral valve, such as from a birth defect or heart attack.  Infection.  Heart disease.  Mitral valve prolapse. RISK FACTORS You are more likely to develop mitral valve regurgitation if you have:  Mitral valve prolapse.  A heart valve infection.  High blood pressure.  Coronary or rheumatic heart disease.  Swelling in your left ventricle.  Marfan syndrome.  Untreated syphilis. You are also more likely to develop the condition if you have taken certain diet pills in the past. SIGNS AND SYMPTOMS If the condition is mild, you may not have symptoms. If you do have symptoms, they can include:  Shortness of breath with physical activity, like climbing stairs.  Fast or irregular heartbeat.  Cough.  Excessive urination, especially at night.  Heavy breathing.  Extreme tiredness.  Lightheadedness. DIAGNOSIS To diagnose mitral valve regurgitation, your health care provider will listen to your heart for an abnormal heart sound (murmur). Your health care provider may also order tests, such as:  An echocardiogram.  A CT scan.  An MRI. TREATMENT  Treatment may include:  Medicines. These may be given to treat symptoms and prevent  complications.  Surgery to repair or replace the mitral valve. This may be done if:  Your heart becomes larger than normal.  Your heart cannot pump blood well.  Your symptoms get worse. HOME CARE INSTRUCTIONS   Take medicines only as directed by your health care provider.  Eat a heart-healthy diet. A dietitian can help you to plan meals.  Do not use any tobacco products, including cigarettes, chewing tobacco, and electronic cigarettes. If you need help quitting, ask your health care provider.  Work closely with your health care provider to manage lasting conditions, such as diabetes and high blood pressure.  Maintain a healthy weight and stay physically active. Ask your health care provider to recommend some activities that are safe for you to do.  Limit alcohol intake to no more than 1 drink per day for nonpregnant women and 2 drinks per day for men. One drink equals 12 ounces of beer, 5 ounces of wine, or 1 ounces of hard liquor.  Try to get at least 7 hours of sleep each night.  Find ways to manage stress. SEEK IMMEDIATE MEDICAL CARE IF:  You have shortness of breath.  You develop chest pain.  You sweat.  You feel nauseous.  You have swelling in your hands, feet, ankles, or abdomen that is getting worse.  You have a feeling of fullness in your abdomen.  You lose your appetite.  You feel dizzy or unsteady.  You have blurred vision or a headache.  Any of your symptoms begin to get worse.  You develop muscle aches, chills, or fever.  You feel ill.   This information is not intended to   replace advice given to you by your health care provider. Make sure you discuss any questions you have with your health care provider.   Document Released: 01/01/2005 Document Revised: 11/04/2014 Document Reviewed: 03/16/2014 Elsevier Interactive Patient Education 2016 [employer]  

## 2016-03-26 DIAGNOSIS — Z3A01 Less than 8 weeks gestation of pregnancy: Secondary | ICD-10-CM | POA: Diagnosis not present

## 2016-03-26 DIAGNOSIS — O09299 Supervision of pregnancy with other poor reproductive or obstetric history, unspecified trimester: Secondary | ICD-10-CM | POA: Diagnosis not present

## 2016-03-27 ENCOUNTER — Ambulatory Visit (HOSPITAL_BASED_OUTPATIENT_CLINIC_OR_DEPARTMENT_OTHER)
Admission: RE | Admit: 2016-03-27 | Discharge: 2016-03-27 | Disposition: A | Discharge status: Home / Self Care | Payer: BLUE CROSS/BLUE SHIELD | Source: Ambulatory Visit | Attending: Family Medicine | Admitting: Family Medicine

## 2016-03-27 DIAGNOSIS — I10 Essential (primary) hypertension: Secondary | ICD-10-CM | POA: Insufficient documentation

## 2016-03-27 DIAGNOSIS — R0602 Shortness of breath: Secondary | ICD-10-CM

## 2016-03-27 DIAGNOSIS — K219 Gastro-esophageal reflux disease without esophagitis: Secondary | ICD-10-CM | POA: Insufficient documentation

## 2016-03-27 DIAGNOSIS — R0600 Dyspnea, unspecified: Secondary | ICD-10-CM | POA: Diagnosis present

## 2016-03-27 NOTE — Progress Notes (Signed)
  Echocardiogram 2D Echocardiogram has been performed.  ,  03/27/2016, 11:10 AM

## 2016-03-28 DIAGNOSIS — O09291 Supervision of pregnancy with other poor reproductive or obstetric history, first trimester: Secondary | ICD-10-CM | POA: Diagnosis not present

## 2016-03-28 DIAGNOSIS — Z3A01 Less than 8 weeks gestation of pregnancy: Secondary | ICD-10-CM | POA: Diagnosis not present

## 2016-03-29 DIAGNOSIS — Z3201 Encounter for pregnancy test, result positive: Secondary | ICD-10-CM | POA: Diagnosis not present

## 2016-04-11 DIAGNOSIS — O365910 Maternal care for other known or suspected poor fetal growth, first trimester, not applicable or unspecified: Secondary | ICD-10-CM | POA: Diagnosis not present

## 2016-04-11 DIAGNOSIS — Z3A01 Less than 8 weeks gestation of pregnancy: Secondary | ICD-10-CM | POA: Diagnosis not present

## 2016-04-23 DIAGNOSIS — Z36 Encounter for antenatal screening of mother: Secondary | ICD-10-CM | POA: Diagnosis not present

## 2016-04-23 DIAGNOSIS — O09891 Supervision of other high risk pregnancies, first trimester: Secondary | ICD-10-CM | POA: Diagnosis not present

## 2016-04-23 DIAGNOSIS — O09291 Supervision of pregnancy with other poor reproductive or obstetric history, first trimester: Secondary | ICD-10-CM | POA: Diagnosis not present

## 2016-04-23 LAB — OB RESULTS CONSOLE HIV ANTIBODY (ROUTINE TESTING): HIV: NONREACTIVE

## 2016-04-23 LAB — OB RESULTS CONSOLE GC/CHLAMYDIA
Chlamydia: NEGATIVE
Gonorrhea: NEGATIVE

## 2016-04-23 LAB — OB RESULTS CONSOLE ABO/RH: RH Type: POSITIVE

## 2016-04-23 LAB — OB RESULTS CONSOLE HEPATITIS B SURFACE ANTIGEN: Hepatitis B Surface Ag: NEGATIVE

## 2016-04-23 LAB — OB RESULTS CONSOLE RUBELLA ANTIBODY, IGM: Rubella: IMMUNE

## 2016-04-23 LAB — OB RESULTS CONSOLE ANTIBODY SCREEN: Antibody Screen: NEGATIVE

## 2016-04-23 LAB — OB RESULTS CONSOLE RPR: RPR: NONREACTIVE

## 2016-05-03 DIAGNOSIS — Z3A10 10 weeks gestation of pregnancy: Secondary | ICD-10-CM | POA: Diagnosis not present

## 2016-05-03 DIAGNOSIS — O09291 Supervision of pregnancy with other poor reproductive or obstetric history, first trimester: Secondary | ICD-10-CM | POA: Diagnosis not present

## 2016-05-03 DIAGNOSIS — O09891 Supervision of other high risk pregnancies, first trimester: Secondary | ICD-10-CM | POA: Diagnosis not present

## 2016-05-23 ENCOUNTER — Encounter: Payer: Self-pay | Admitting: Allergy and Immunology

## 2016-05-23 ENCOUNTER — Ambulatory Visit (INDEPENDENT_AMBULATORY_CARE_PROVIDER_SITE_OTHER): Payer: BLUE CROSS/BLUE SHIELD | Admitting: Allergy and Immunology

## 2016-05-23 VITALS — BP 136/82 | HR 80 | Temp 98.7°F | Resp 16 | Ht 63.0 in | Wt 189.0 lb

## 2016-05-23 DIAGNOSIS — J4530 Mild persistent asthma, uncomplicated: Secondary | ICD-10-CM

## 2016-05-23 DIAGNOSIS — J310 Chronic rhinitis: Secondary | ICD-10-CM

## 2016-05-23 MED ORDER — ALBUTEROL SULFATE HFA 108 (90 BASE) MCG/ACT IN AERS
INHALATION_SPRAY | RESPIRATORY_TRACT | 1 refills | Status: DC
Start: 2016-05-23 — End: 2018-06-26

## 2016-05-23 MED ORDER — BUDESONIDE 180 MCG/ACT IN AEPB
INHALATION_SPRAY | RESPIRATORY_TRACT | 5 refills | Status: DC
Start: 2016-05-23 — End: 2016-08-27

## 2016-05-23 NOTE — Progress Notes (Signed)
FOLLOW UP NOTE  RE: Karla Huber MRN: [id number] DOB: 1986/02/15 [facility] [address] [place] [address] Date of Office Visit: 05/23/2016  Subjective:  Karla Huber is a 30 y.o. female who presents today for Asthma (no symptoms pt is doing well)  Assessment:   1. Mild persistent asthma, well controlled.  2. Mixed rhinitis.   3.      Pregnancy 13 weeks. Plan:   Meds ordered this encounter  Medications  . budesonide (PULMICORT FLEXHALER) 180 MCG/ACT inhaler    Sig: Use 2 puffs twice daily to prevent cough or wheeze.  Rinse, gargle, and spit after use.    Dispense:  1 each    Refill:  5  . albuterol (PROVENTIL HFA;VENTOLIN HFA) 108 (90 Base) MCG/ACT inhaler    Sig: Use 2 puffs every four hours as needed for cough or wheeze.  May use 2 puffs 10-20 minutes prior to exercise.    Dispense:  1 Inhaler    Refill:  1  1.   Continue current medication regime--Pulmicort and Zyrtec. 2.   Saline nasal wash each evening. 3.   Rhinocort once daily as needed. 4.   Follow-up in 3 months or sooner if needed, given previous history of pregnancy related symptoms.  HPI: Karla Huber returns to the office in follow-up of mixed rhinitis and asthma.  She reports since her last visit in August 2016, she missed her return visit, but has been feeling well.  She is exercising regularly with walking or the elliptical and reports no ProAir use in more than 6 months, currently using Pulmicort once daily.  She reports she is expecting her second child and the OB's decided to place her on low dose ASA as well as maintain the Protonix.  She has no new concerns today and denies congestion, cough, rhinorrhea, wheeze, shortness of breath or additional concerns.  No recent nocturnal awakenings or new medical issues.  Denies ED or urgent care visits, prednisone or antibiotic courses. Reports sleep and activity are normal.  She has used Rhinocort on a few occasions which is  beneficial.  Karla Huber has a current medication list which includes the following prescription(s): albuterol, aspirin, budesonide, cetirizine, pantoprazole, and prenatal multivitamin.   Drug Allergies: Allergies  Allergen Reactions  . Amoxicillin Hives  . Hydrocodone Hives    Has tolerated Percocet in the past  . Tdap [Diphth-Acell Pertussis-Tetanus] Other (See Comments)    Fever, Joint stiffness   Objective:   Vitals:   05/23/16 1120 05/23/16 1147  BP: 136/82   Pulse: (!) 121 80  Resp: 16   Temp: 98.7 F (37.1 C)    SpO2 Readings from Last 1 Encounters:  05/23/16 99%   Physical Exam  Constitutional: She is well-developed, well-nourished, and in no distress.  HENT:  Head: Atraumatic.  Right Ear: Tympanic membrane and ear canal normal.  Left Ear: Tympanic membrane and ear canal normal.  Nose: Mucosal edema (minimal) present. No rhinorrhea. No epistaxis.  Mouth/Throat: Oropharynx is clear and moist and mucous membranes are normal. No oropharyngeal exudate, posterior oropharyngeal edema or posterior oropharyngeal erythema.  Neck: Neck supple.  Cardiovascular: Normal rate, S1 normal and S2 normal.   No murmur heard. Pulmonary/Chest: Effort normal. She has no wheezes. She has no rhonchi. She has no rales.  Lymphadenopathy:    She has no cervical adenopathy.   Diagnostics: Spirometry: FVC  3.68--108%, FEV1 3.20--109%.    Roselyn M. Willa Rough, MD  cc: Lelon Perla  Chase, DO

## 2016-05-24 NOTE — Patient Instructions (Signed)
   Continue current medication regime.  Saline nasal wash each evening.  Rhinocort once daily as needed.  Follow-up in 3 months or sooner if needed.

## 2016-06-14 DIAGNOSIS — Z3A16 16 weeks gestation of pregnancy: Secondary | ICD-10-CM | POA: Diagnosis not present

## 2016-06-14 DIAGNOSIS — O09892 Supervision of other high risk pregnancies, second trimester: Secondary | ICD-10-CM | POA: Diagnosis not present

## 2016-06-14 DIAGNOSIS — Z36 Encounter for antenatal screening of mother: Secondary | ICD-10-CM | POA: Diagnosis not present

## 2016-06-17 DIAGNOSIS — O09891 Supervision of other high risk pregnancies, first trimester: Secondary | ICD-10-CM | POA: Diagnosis not present

## 2016-06-17 DIAGNOSIS — Z3A16 16 weeks gestation of pregnancy: Secondary | ICD-10-CM | POA: Diagnosis not present

## 2016-07-02 DIAGNOSIS — Z3A19 19 weeks gestation of pregnancy: Secondary | ICD-10-CM | POA: Diagnosis not present

## 2016-07-02 DIAGNOSIS — O4442 Low lying placenta NOS or without hemorrhage, second trimester: Secondary | ICD-10-CM | POA: Diagnosis not present

## 2016-07-30 DIAGNOSIS — Z23 Encounter for immunization: Secondary | ICD-10-CM | POA: Diagnosis not present

## 2016-08-22 DIAGNOSIS — O99810 Abnormal glucose complicating pregnancy: Secondary | ICD-10-CM | POA: Diagnosis not present

## 2016-08-22 DIAGNOSIS — Z362 Encounter for other antenatal screening follow-up: Secondary | ICD-10-CM | POA: Diagnosis not present

## 2016-08-22 DIAGNOSIS — Z3A26 26 weeks gestation of pregnancy: Secondary | ICD-10-CM | POA: Diagnosis not present

## 2016-08-22 DIAGNOSIS — O43891 Other placental disorders, first trimester: Secondary | ICD-10-CM | POA: Diagnosis not present

## 2016-08-27 ENCOUNTER — Ambulatory Visit (INDEPENDENT_AMBULATORY_CARE_PROVIDER_SITE_OTHER): Payer: BLUE CROSS/BLUE SHIELD | Admitting: Family Medicine

## 2016-08-27 ENCOUNTER — Encounter: Payer: Self-pay | Admitting: Family Medicine

## 2016-08-27 VITALS — BP 110/62 | HR 97 | Temp 98.1°F | Resp 16 | Ht 63.0 in | Wt 210.2 lb

## 2016-08-27 DIAGNOSIS — J4520 Mild intermittent asthma, uncomplicated: Secondary | ICD-10-CM

## 2016-08-27 DIAGNOSIS — O24410 Gestational diabetes mellitus in pregnancy, diet controlled: Secondary | ICD-10-CM

## 2016-08-27 DIAGNOSIS — Z0000 Encounter for general adult medical examination without abnormal findings: Secondary | ICD-10-CM | POA: Diagnosis not present

## 2016-08-27 LAB — COMPREHENSIVE METABOLIC PANEL WITH GFR
ALT: 13 U/L (ref 0–35)
AST: 12 U/L (ref 0–37)
Albumin: 3.8 g/dL (ref 3.5–5.2)
Alkaline Phosphatase: 79 U/L (ref 39–117)
BUN: 7 mg/dL (ref 6–23)
CO2: 21 meq/L (ref 19–32)
Calcium: 9.3 mg/dL (ref 8.4–10.5)
Chloride: 104 meq/L (ref 96–112)
Creatinine, Ser: 0.54 mg/dL (ref 0.40–1.20)
GFR: 139.79 mL/min
Glucose, Bld: 103 mg/dL — ABNORMAL HIGH (ref 70–99)
Potassium: 3.8 meq/L (ref 3.5–5.1)
Sodium: 135 meq/L (ref 135–145)
Total Bilirubin: 0.3 mg/dL (ref 0.2–1.2)
Total Protein: 7.2 g/dL (ref 6.0–8.3)

## 2016-08-27 LAB — POCT URINALYSIS DIPSTICK
Bilirubin, UA: NEGATIVE
Blood, UA: NEGATIVE
Glucose, UA: NEGATIVE
Ketones, UA: NEGATIVE
Leukocytes, UA: NEGATIVE
Nitrite, UA: NEGATIVE
Protein, UA: NEGATIVE
Spec Grav, UA: 1.02
Urobilinogen, UA: 0.2
pH, UA: 6

## 2016-08-27 LAB — CBC WITH DIFFERENTIAL/PLATELET
Basophils Absolute: 0 K/uL (ref 0.0–0.1)
Basophils Relative: 0.2 % (ref 0.0–3.0)
Eosinophils Absolute: 0.1 K/uL (ref 0.0–0.7)
Eosinophils Relative: 0.9 % (ref 0.0–5.0)
HCT: 30.7 % — ABNORMAL LOW (ref 36.0–46.0)
Hemoglobin: 10.4 g/dL — ABNORMAL LOW (ref 12.0–15.0)
Lymphocytes Relative: 12.8 % (ref 12.0–46.0)
Lymphs Abs: 1.3 K/uL (ref 0.7–4.0)
MCHC: 33.7 g/dL (ref 30.0–36.0)
MCV: 81.7 fl (ref 78.0–100.0)
Monocytes Absolute: 0.6 K/uL (ref 0.1–1.0)
Monocytes Relative: 6 % (ref 3.0–12.0)
Neutro Abs: 8.2 K/uL — ABNORMAL HIGH (ref 1.4–7.7)
Neutrophils Relative %: 80.1 % — ABNORMAL HIGH (ref 43.0–77.0)
Platelets: 320 K/uL (ref 150.0–400.0)
RBC: 3.76 Mil/uL — ABNORMAL LOW (ref 3.87–5.11)
RDW: 14.2 % (ref 11.5–15.5)
WBC: 10.2 K/uL (ref 4.0–10.5)

## 2016-08-27 LAB — LIPID PANEL
Cholesterol: 285 mg/dL — ABNORMAL HIGH (ref 0–200)
HDL: 106.9 mg/dL (ref >39.00)
NonHDL: 178.55
Total CHOL/HDL Ratio: 3
Triglycerides: 222 mg/dL — ABNORMAL HIGH (ref 0.0–149.0)
VLDL: 44.4 mg/dL — ABNORMAL HIGH (ref 0.0–40.0)

## 2016-08-27 LAB — HEMOGLOBIN A1C: Hgb A1c MFr Bld: 5.5 % (ref 4.6–6.5)

## 2016-08-27 LAB — TSH: TSH: 2.11 u[IU]/mL (ref 0.35–4.50)

## 2016-08-27 LAB — LDL CHOLESTEROL, DIRECT: Direct LDL: 164 mg/dL

## 2016-08-27 MED ORDER — BUDESONIDE 180 MCG/ACT IN AEPB: INHALATION_SPRAY | RESPIRATORY_TRACT | 3 refills | Status: DC

## 2016-08-27 NOTE — Patient Instructions (Signed)
Preventive Care for Adults, Female A healthy lifestyle and preventive care can promote health and wellness. Preventive health guidelines for women include the following key practices.  A routine yearly physical is a good way to check with your health care provider about your health and preventive screening. It is a chance to share any concerns and updates on your health and to receive a thorough exam.  Visit your dentist for a routine exam and preventive care every 6 months. Brush your teeth twice a day and floss once a day. Good oral hygiene prevents tooth decay and gum disease.  The frequency of eye exams is based on your age, health, family medical history, use of contact lenses, and other factors. Follow your health care provider's recommendations for frequency of eye exams.  Eat a healthy diet. Foods like vegetables, fruits, whole grains, low-fat dairy products, and lean protein foods contain the nutrients you need without too many calories. Decrease your intake of foods high in solid fats, added sugars, and salt. Eat the right amount of calories for you.Get information about a proper diet from your health care provider, if necessary.  Regular physical exercise is one of the most important things you can do for your health. Most adults should get at least 150 minutes of moderate-intensity exercise (any activity that increases your heart rate and causes you to sweat) each week. In addition, most adults need muscle-strengthening exercises on 2 or more days a week.  Maintain a healthy weight. The body mass index (BMI) is a screening tool to identify possible weight problems. It provides an estimate of body fat based on height and weight. Your health care provider can find your BMI and can help you achieve or maintain a healthy weight.For adults 20 years and older:  A BMI below 18.5 is considered underweight.  A BMI of 18.5 to 24.9 is normal.  A BMI of 25 to 29.9 is considered overweight.  A  BMI of 30 and above is considered obese.  Maintain normal blood lipids and cholesterol levels by exercising and minimizing your intake of saturated fat. Eat a balanced diet with plenty of fruit and vegetables. Blood tests for lipids and cholesterol should begin at age 45 and be repeated every 5 years. If your lipid or cholesterol levels are high, you are over 50, or you are at high risk for heart disease, you may need your cholesterol levels checked more frequently.Ongoing high lipid and cholesterol levels should be treated with medicines if diet and exercise are not working.  If you smoke, find out from your health care provider how to quit. If you do not use tobacco, do not start.  Lung cancer screening is recommended for adults aged 45-80 years who are at high risk for developing lung cancer because of a history of smoking. A yearly low-dose CT scan of the lungs is recommended for people who have at least a 30-pack-year history of smoking and are a current smoker or have quit within the past 15 years. A pack year of smoking is smoking an average of 1 pack of cigarettes a day for 1 year (for example: 1 pack a day for 30 years or 2 packs a day for 15 years). Yearly screening should continue until the smoker has stopped smoking for at least 15 years. Yearly screening should be stopped for people who develop a health problem that would prevent them from having lung cancer treatment.  If you are pregnant, do not drink alcohol. If you are  breastfeeding, be very cautious about drinking alcohol. If you are not pregnant and choose to drink alcohol, do not have more than 1 drink per day. One drink is considered to be 12 ounces (355 mL) of beer, 5 ounces (148 mL) of wine, or 1.5 ounces (44 mL) of liquor.  Avoid use of street drugs. Do not share needles with anyone. Ask for help if you need support or instructions about stopping the use of drugs.  High blood pressure causes heart disease and increases the risk  of stroke. Your blood pressure should be checked at least every 1 to 2 years. Ongoing high blood pressure should be treated with medicines if weight loss and exercise do not work.  If you are 55-79 years old, ask your health care provider if you should take aspirin to prevent strokes.  Diabetes screening is done by taking a blood sample to check your blood glucose level after you have not eaten for a certain period of time (fasting). If you are not overweight and you do not have risk factors for diabetes, you should be screened once every 3 years starting at age 45. If you are overweight or obese and you are 40-70 years of age, you should be screened for diabetes every year as part of your cardiovascular risk assessment.  Breast cancer screening is essential preventive care for women. You should practice "breast self-awareness." This means understanding the normal appearance and feel of your breasts and may include breast self-examination. Any changes detected, no matter how small, should be reported to a health care provider. Women in their 20s and 30s should have a clinical breast exam (CBE) by a health care provider as part of a regular health exam every 1 to 3 years. After age 40, women should have a CBE every year. Starting at age 40, women should consider having a mammogram (breast X-ray test) every year. Women who have a family history of breast cancer should talk to their health care provider about genetic screening. Women at a high risk of breast cancer should talk to their health care providers about having an MRI and a mammogram every year.  Breast cancer gene (BRCA)-related cancer risk assessment is recommended for women who have family members with BRCA-related cancers. BRCA-related cancers include breast, ovarian, tubal, and peritoneal cancers. Having family members with these cancers may be associated with an increased risk for harmful changes (mutations) in the breast cancer genes BRCA1 and  BRCA2. Results of the assessment will determine the need for genetic counseling and BRCA1 and BRCA2 testing.  Your health care provider may recommend that you be screened regularly for cancer of the pelvic organs (ovaries, uterus, and vagina). This screening involves a pelvic examination, including checking for microscopic changes to the surface of your cervix (Pap test). You may be encouraged to have this screening done every 3 years, beginning at age 21.  For women ages 30-65, health care providers may recommend pelvic exams and Pap testing every 3 years, or they may recommend the Pap and pelvic exam, combined with testing for human papilloma virus (HPV), every 5 years. Some types of HPV increase your risk of cervical cancer. Testing for HPV may also be done on women of any age with unclear Pap test results.  Other health care providers may not recommend any screening for nonpregnant women who are considered low risk for pelvic cancer and who do not have symptoms. Ask your health care provider if a screening pelvic exam is right for   you.  If you have had past treatment for cervical cancer or a condition that could lead to cancer, you need Pap tests and screening for cancer for at least 20 years after your treatment. If Pap tests have been discontinued, your risk factors (such as having a new sexual partner) need to be reassessed to determine if screening should resume. Some women have medical problems that increase the chance of getting cervical cancer. In these cases, your health care provider may recommend more frequent screening and Pap tests.  Colorectal cancer can be detected and often prevented. Most routine colorectal cancer screening begins at the age of 50 years and continues through age 75 years. However, your health care provider may recommend screening at an earlier age if you have risk factors for colon cancer. On a yearly basis, your health care provider may provide home test kits to check  for hidden blood in the stool. Use of a small camera at the end of a tube, to directly examine the colon (sigmoidoscopy or colonoscopy), can detect the earliest forms of colorectal cancer. Talk to your health care provider about this at age 50, when routine screening begins. Direct exam of the colon should be repeated every 5-10 years through age 75 years, unless early forms of precancerous polyps or small growths are found.  People who are at an increased risk for hepatitis B should be screened for this virus. You are considered at high risk for hepatitis B if:  You were born in a country where hepatitis B occurs often. Talk with your health care provider about which countries are considered high risk.  Your parents were born in a high-risk country and you have not received a shot to protect against hepatitis B (hepatitis B vaccine).  You have HIV or AIDS.  You use needles to inject street drugs.  You live with, or have sex with, someone who has hepatitis B.  You get hemodialysis treatment.  You take certain medicines for conditions like cancer, organ transplantation, and autoimmune conditions.  Hepatitis C blood testing is recommended for all people born from 1945 through 1965 and any individual with known risks for hepatitis C.  Practice safe sex. Use condoms and avoid high-risk sexual practices to reduce the spread of sexually transmitted infections (STIs). STIs include gonorrhea, chlamydia, syphilis, trichomonas, herpes, HPV, and human immunodeficiency virus (HIV). Herpes, HIV, and HPV are viral illnesses that have no cure. They can result in disability, cancer, and death.  You should be screened for sexually transmitted illnesses (STIs) including gonorrhea and chlamydia if:  You are sexually active and are younger than 24 years.  You are older than 24 years and your health care provider tells you that you are at risk for this type of infection.  Your sexual activity has changed  since you were last screened and you are at an increased risk for chlamydia or gonorrhea. Ask your health care provider if you are at risk.  If you are at risk of being infected with HIV, it is recommended that you take a prescription medicine daily to prevent HIV infection. This is called preexposure prophylaxis (PrEP). You are considered at risk if:  You are sexually active and do not regularly use condoms or know the HIV status of your partner(s).  You take drugs by injection.  You are sexually active with a partner who has HIV.  Talk with your health care provider about whether you are at high risk of being infected with HIV. If   you choose to begin PrEP, you should first be tested for HIV. You should then be tested every 3 months for as long as you are taking PrEP.  Osteoporosis is a disease in which the bones lose minerals and strength with aging. This can result in serious bone fractures or breaks. The risk of osteoporosis can be identified using a bone density scan. Women ages 67 years and over and women at risk for fractures or osteoporosis should discuss screening with their health care providers. Ask your health care provider whether you should take a calcium supplement or vitamin D to reduce the rate of osteoporosis.  Menopause can be associated with physical symptoms and risks. Hormone replacement therapy is available to decrease symptoms and risks. You should talk to your health care provider about whether hormone replacement therapy is right for you.  Use sunscreen. Apply sunscreen liberally and repeatedly throughout the day. You should seek shade when your shadow is shorter than you. Protect yourself by wearing long sleeves, pants, a wide-brimmed hat, and sunglasses year round, whenever you are outdoors.  Once a month, do a whole body skin exam, using a mirror to look at the skin on your back. Tell your health care provider of new moles, moles that have irregular borders, moles that  are larger than a pencil eraser, or moles that have changed in shape or color.  Stay current with required vaccines (immunizations).  Influenza vaccine. All adults should be immunized every year.  Tetanus, diphtheria, and acellular pertussis (Td, Tdap) vaccine. Pregnant women should receive 1 dose of Tdap vaccine during each pregnancy. The dose should be obtained regardless of the length of time since the last dose. Immunization is preferred during the 27th-36th week of gestation. An adult who has not previously received Tdap or who does not know her vaccine status should receive 1 dose of Tdap. This initial dose should be followed by tetanus and diphtheria toxoids (Td) booster doses every 10 years. Adults with an unknown or incomplete history of completing a 3-dose immunization series with Td-containing vaccines should begin or complete a primary immunization series including a Tdap dose. Adults should receive a Td booster every 10 years.  Varicella vaccine. An adult without evidence of immunity to varicella should receive 2 doses or a second dose if she has previously received 1 dose. Pregnant females who do not have evidence of immunity should receive the first dose after pregnancy. This first dose should be obtained before leaving the health care facility. The second dose should be obtained 4-8 weeks after the first dose.  Human papillomavirus (HPV) vaccine. Females aged 13-26 years who have not received the vaccine previously should obtain the 3-dose series. The vaccine is not recommended for use in pregnant females. However, pregnancy testing is not needed before receiving a dose. If a female is found to be pregnant after receiving a dose, no treatment is needed. In that case, the remaining doses should be delayed until after the pregnancy. Immunization is recommended for any person with an immunocompromised condition through the age of 61 years if she did not get any or all doses earlier. During the  3-dose series, the second dose should be obtained 4-8 weeks after the first dose. The third dose should be obtained 24 weeks after the first dose and 16 weeks after the second dose.  Zoster vaccine. One dose is recommended for adults aged 30 years or older unless certain conditions are present.  Measles, mumps, and rubella (MMR) vaccine. Adults born  before 1957 generally are considered immune to measles and mumps. Adults born in 1957 or later should have 1 or more doses of MMR vaccine unless there is a contraindication to the vaccine or there is laboratory evidence of immunity to each of the three diseases. A routine second dose of MMR vaccine should be obtained at least 28 days after the first dose for students attending postsecondary schools, health care workers, or international travelers. People who received inactivated measles vaccine or an unknown type of measles vaccine during 1963-1967 should receive 2 doses of MMR vaccine. People who received inactivated mumps vaccine or an unknown type of mumps vaccine before 1979 and are at high risk for mumps infection should consider immunization with 2 doses of MMR vaccine. For females of childbearing age, rubella immunity should be determined. If there is no evidence of immunity, females who are not pregnant should be vaccinated. If there is no evidence of immunity, females who are pregnant should delay immunization until after pregnancy. Unvaccinated health care workers born before 1957 who lack laboratory evidence of measles, mumps, or rubella immunity or laboratory confirmation of disease should consider measles and mumps immunization with 2 doses of MMR vaccine or rubella immunization with 1 dose of MMR vaccine.  Pneumococcal 13-valent conjugate (PCV13) vaccine. When indicated, a person who is uncertain of his immunization history and has no record of immunization should receive the PCV13 vaccine. All adults 65 years of age and older should receive this  vaccine. An adult aged 19 years or older who has certain medical conditions and has not been previously immunized should receive 1 dose of PCV13 vaccine. This PCV13 should be followed with a dose of pneumococcal polysaccharide (PPSV23) vaccine. Adults who are at high risk for pneumococcal disease should obtain the PPSV23 vaccine at least 8 weeks after the dose of PCV13 vaccine. Adults older than 31 years of age who have normal immune system function should obtain the PPSV23 vaccine dose at least 1 year after the dose of PCV13 vaccine.  Pneumococcal polysaccharide (PPSV23) vaccine. When PCV13 is also indicated, PCV13 should be obtained first. All adults aged 65 years and older should be immunized. An adult younger than age 65 years who has certain medical conditions should be immunized. Any person who resides in a nursing home or long-term care facility should be immunized. An adult smoker should be immunized. People with an immunocompromised condition and certain other conditions should receive both PCV13 and PPSV23 vaccines. People with human immunodeficiency virus (HIV) infection should be immunized as soon as possible after diagnosis. Immunization during chemotherapy or radiation therapy should be avoided. Routine use of PPSV23 vaccine is not recommended for American Indians, [place] Natives, or people younger than 65 years unless there are medical conditions that require PPSV23 vaccine. When indicated, people who have unknown immunization and have no record of immunization should receive PPSV23 vaccine. One-time revaccination 5 years after the first dose of PPSV23 is recommended for people aged 19-64 years who have chronic kidney failure, nephrotic syndrome, asplenia, or immunocompromised conditions. People who received 1-2 doses of PPSV23 before age 65 years should receive another dose of PPSV23 vaccine at age 65 years or later if at least 5 years have passed since the previous dose. Doses of PPSV23 are not  needed for people immunized with PPSV23 at or after age 65 years.  Meningococcal vaccine. Adults with asplenia or persistent complement component deficiencies should receive 2 doses of quadrivalent meningococcal conjugate (MenACWY-D) vaccine. The doses should be obtained   at least 2 months apart. Microbiologists working with certain meningococcal bacteria, [place] recruits, people at risk during an outbreak, and people who travel to or live in countries with a high rate of meningitis should be immunized. A first-year college student up through age 34 years who is living in a residence hall should receive a dose if she did not receive a dose on or after her 16th birthday. Adults who have certain high-risk conditions should receive one or more doses of vaccine.  Hepatitis A vaccine. Adults who wish to be protected from this disease, have certain high-risk conditions, work with hepatitis A-infected animals, work in hepatitis A research labs, or travel to or work in countries with a high rate of hepatitis A should be immunized. Adults who were previously unvaccinated and who anticipate close contact with an international adoptee during the first 60 days after arrival in the [place] from a country with a high rate of hepatitis A should be immunized.  Hepatitis B vaccine. Adults who wish to be protected from this disease, have certain high-risk conditions, may be exposed to blood or other infectious body fluids, are household contacts or sex partners of hepatitis B positive people, are clients or workers in certain care facilities, or travel to or work in countries with a high rate of hepatitis B should be immunized.  Haemophilus influenzae type b (Hib) vaccine. A previously unvaccinated person with asplenia or sickle cell disease or having a scheduled splenectomy should receive 1 dose of Hib vaccine. Regardless of previous immunization, a recipient of a hematopoietic stem cell transplant should receive a  3-dose series 6-12 months after her successful transplant. Hib vaccine is not recommended for adults with HIV infection. Preventive Services / Frequency Ages 35 to 4 years  Blood pressure check.** / Every 3-5 years.  Lipid and cholesterol check.** / Every 5 years beginning at age 60.  Clinical breast exam.** / Every 3 years for women in their 71s and 10s.  BRCA-related cancer risk assessment.** / For women who have family members with a BRCA-related cancer (breast, ovarian, tubal, or peritoneal cancers).  Pap test.** / Every 2 years from ages 76 through 26. Every 3 years starting at age 61 through age 76 or 93 with a history of 3 consecutive normal Pap tests.  HPV screening.** / Every 3 years from ages 37 through ages 60 to 51 with a history of 3 consecutive normal Pap tests.  Hepatitis C blood test.** / For any individual with known risks for hepatitis C.  Skin self-exam. / Monthly.  Influenza vaccine. / Every year.  Tetanus, diphtheria, and acellular pertussis (Tdap, Td) vaccine.** / Consult your health care provider. Pregnant women should receive 1 dose of Tdap vaccine during each pregnancy. 1 dose of Td every 10 years.  Varicella vaccine.** / Consult your health care provider. Pregnant females who do not have evidence of immunity should receive the first dose after pregnancy.  HPV vaccine. / 3 doses over 6 months, if 93 and younger. The vaccine is not recommended for use in pregnant females. However, pregnancy testing is not needed before receiving a dose.  Measles, mumps, rubella (MMR) vaccine.** / You need at least 1 dose of MMR if you were born in 1957 or later. You may also need a 2nd dose. For females of childbearing age, rubella immunity should be determined. If there is no evidence of immunity, females who are not pregnant should be vaccinated. If there is no evidence of immunity, females who are  pregnant should delay immunization until after pregnancy.  Pneumococcal  13-valent conjugate (PCV13) vaccine.** / Consult your health care provider.  Pneumococcal polysaccharide (PPSV23) vaccine.** / 1 to 2 doses if you smoke cigarettes or if you have certain conditions.  Meningococcal vaccine.** / 1 dose if you are age 68 to 8 years and a Market researcher living in a residence hall, or have one of several medical conditions, you need to get vaccinated against meningococcal disease. You may also need additional booster doses.  Hepatitis A vaccine.** / Consult your health care provider.  Hepatitis B vaccine.** / Consult your health care provider.  Haemophilus influenzae type b (Hib) vaccine.** / Consult your health care provider. Ages 7 to 53 years  Blood pressure check.** / Every year.  Lipid and cholesterol check.** / Every 5 years beginning at age 25 years.  Lung cancer screening. / Every year if you are aged 11-80 years and have a 30-pack-year history of smoking and currently smoke or have quit within the past 15 years. Yearly screening is stopped once you have quit smoking for at least 15 years or develop a health problem that would prevent you from having lung cancer treatment.  Clinical breast exam.** / Every year after age 48 years.  BRCA-related cancer risk assessment.** / For women who have family members with a BRCA-related cancer (breast, ovarian, tubal, or peritoneal cancers).  Mammogram.** / Every year beginning at age 41 years and continuing for as long as you are in good health. Consult with your health care provider.  Pap test.** / Every 3 years starting at age 65 years through age 37 or 70 years with a history of 3 consecutive normal Pap tests.  HPV screening.** / Every 3 years from ages 72 years through ages 60 to 40 years with a history of 3 consecutive normal Pap tests.  Fecal occult blood test (FOBT) of stool. / Every year beginning at age 21 years and continuing until age 5 years. You may not need to do this test if you get  a colonoscopy every 10 years.  Flexible sigmoidoscopy or colonoscopy.** / Every 5 years for a flexible sigmoidoscopy or every 10 years for a colonoscopy beginning at age 35 years and continuing until age 48 years.  Hepatitis C blood test.** / For all people born from [birth year] through 1965 and any individual with known risks for hepatitis C.  Skin self-exam. / Monthly.  Influenza vaccine. / Every year.  Tetanus, diphtheria, and acellular pertussis (Tdap/Td) vaccine.** / Consult your health care provider. Pregnant women should receive 1 dose of Tdap vaccine during each pregnancy. 1 dose of Td every 10 years.  Varicella vaccine.** / Consult your health care provider. Pregnant females who do not have evidence of immunity should receive the first dose after pregnancy.  Zoster vaccine.** / 1 dose for adults aged 30 years or older.  Measles, mumps, rubella (MMR) vaccine.** / You need at least 1 dose of MMR if you were born in 1957 or later. You may also need a second dose. For females of childbearing age, rubella immunity should be determined. If there is no evidence of immunity, females who are not pregnant should be vaccinated. If there is no evidence of immunity, females who are pregnant should delay immunization until after pregnancy.  Pneumococcal 13-valent conjugate (PCV13) vaccine.** / Consult your health care provider.  Pneumococcal polysaccharide (PPSV23) vaccine.** / 1 to 2 doses if you smoke cigarettes or if you have certain conditions.  Meningococcal vaccine.** /  Consult your health care provider.  Hepatitis A vaccine.** / Consult your health care provider.  Hepatitis B vaccine.** / Consult your health care provider.  Haemophilus influenzae type b (Hib) vaccine.** / Consult your health care provider. Ages 64 years and over  Blood pressure check.** / Every year.  Lipid and cholesterol check.** / Every 5 years beginning at age 23 years.  Lung cancer screening. / Every year if you  are aged 16-80 years and have a 30-pack-year history of smoking and currently smoke or have quit within the past 15 years. Yearly screening is stopped once you have quit smoking for at least 15 years or develop a health problem that would prevent you from having lung cancer treatment.  Clinical breast exam.** / Every year after age 74 years.  BRCA-related cancer risk assessment.** / For women who have family members with a BRCA-related cancer (breast, ovarian, tubal, or peritoneal cancers).  Mammogram.** / Every year beginning at age 44 years and continuing for as long as you are in good health. Consult with your health care provider.  Pap test.** / Every 3 years starting at age 58 years through age 22 or 39 years with 3 consecutive normal Pap tests. Testing can be stopped between 65 and 70 years with 3 consecutive normal Pap tests and no abnormal Pap or HPV tests in the past 10 years.  HPV screening.** / Every 3 years from ages 64 years through ages 70 or 61 years with a history of 3 consecutive normal Pap tests. Testing can be stopped between 65 and 70 years with 3 consecutive normal Pap tests and no abnormal Pap or HPV tests in the past 10 years.  Fecal occult blood test (FOBT) of stool. / Every year beginning at age 40 years and continuing until age 27 years. You may not need to do this test if you get a colonoscopy every 10 years.  Flexible sigmoidoscopy or colonoscopy.** / Every 5 years for a flexible sigmoidoscopy or every 10 years for a colonoscopy beginning at age 7 years and continuing until age 32 years.  Hepatitis C blood test.** / For all people born from [birth year] through 1966 and any individual with known risks for hepatitis C.  Osteoporosis screening.** / A one-time screening for women ages 30 years and over and women at risk for fractures or osteoporosis.  Skin self-exam. / Monthly.  Influenza vaccine. / Every year.  Tetanus, diphtheria, and acellular pertussis (Tdap/Td)  vaccine.** / 1 dose of Td every 10 years.  Varicella vaccine.** / Consult your health care provider.  Zoster vaccine.** / 1 dose for adults aged 35 years or older.  Pneumococcal 13-valent conjugate (PCV13) vaccine.** / Consult your health care provider.  Pneumococcal polysaccharide (PPSV23) vaccine.** / 1 dose for all adults aged 46 years and older.  Meningococcal vaccine.** / Consult your health care provider.  Hepatitis A vaccine.** / Consult your health care provider.  Hepatitis B vaccine.** / Consult your health care provider.  Haemophilus influenzae type b (Hib) vaccine.** / Consult your health care provider. ** Family history and personal history of risk and conditions may change your health care provider's recommendations.   This information is not intended to replace advice given to you by your health care provider. Make sure you discuss any questions you have with your health care provider.   Document Released: 12/10/2001 Document Revised: 11/04/2014 Document Reviewed: 03/11/2011 Elsevier Interactive Patient Education [employer].

## 2016-08-27 NOTE — Progress Notes (Signed)
Subjective:     Karla BeetsRebekah Huber is a 30 y.o. female and is here for a comprehensive physical exam. The patient reports no problems.  Social History   Social History  . Marital status: Married    Spouse name: N/A  . Number of children: N/A  . Years of education: N/A   Occupational History  . Not on file.   Social History Main Topics  . Smoking status: Never Smoker  . Smokeless tobacco: Never Used  . Alcohol use No  . Drug use: No  . Sexual activity: Yes    Birth control/ protection: None   Other Topics Concern  . Not on file   Social History Narrative  . No narrative on file   Health Maintenance  Topic Date Due  . TETANUS/TDAP  10/29/2015  . PAP SMEAR  06/16/2016  . INFLUENZA VACCINE  Completed  . HIV Screening  Completed    The following portions of the patient's history were reviewed and updated as appropriate:  She  has a past medical history of Asthma; GERD (gastroesophageal reflux disease); Gestational diabetes; Headache; varicella; Hypertension; Postpartum care following cesarean delivery (4/30) (02/25/2015); and Traumatic injury during pregnancy in third trimester (02/09/2015). She  does not have any pertinent problems on file. She  has a past surgical history that includes Tonsillectomy; Foot surgery; Knee surgery; Cesarean section (N/A, 02/25/2015); and Adenoidectomy. Her family history includes Arthritis in her sister; Cancer in her cousin; Heart disease in her mother; Hypertension in her maternal grandmother. She  reports that she has never smoked. She has never used smokeless tobacco. She reports that she does not drink alcohol or use drugs. She has a current medication list which includes the following prescription(s): albuterol, aspirin, budesonide, cetirizine, pantoprazole, and prenatal multivitamin. Current Outpatient Prescriptions on File Prior to Visit  Medication Sig Dispense Refill  . albuterol (PROVENTIL HFA;VENTOLIN HFA) 108 (90 Base) MCG/ACT inhaler  Use 2 puffs every four hours as needed for cough or wheeze.  May use 2 puffs 10-20 minutes prior to exercise. 1 Inhaler 1  . aspirin 81 MG chewable tablet Chew 81 mg by mouth daily.    . cetirizine (ZYRTEC) 10 MG tablet Take 10 mg by mouth daily. Used in Spring & Fall, for seasonal allergies.    . pantoprazole (PROTONIX) 40 MG tablet Take 1 tablet (40 mg total) by mouth daily. 90 tablet 3  . Prenatal Vit-Fe Fumarate-FA (PRENATAL MULTIVITAMIN) TABS tablet Take 1 tablet by mouth daily with supper.     No current facility-administered medications on file prior to visit.    She is allergic to amoxicillin; hydrocodone; and tdap [diphth-acell pertussis-tetanus]..  Review of Systems Review of Systems  Constitutional: Negative for activity change, appetite change and fatigue.  HENT: Negative for hearing loss, congestion, tinnitus and ear discharge.  dentist q239m Eyes: Negative for visual disturbance Respiratory: Negative for cough, chest tightness and shortness of breath.   Cardiovascular: Negative for chest pain, palpitations and leg swelling.  Gastrointestinal: Negative for abdominal pain, diarrhea, constipation and abdominal distention.  Genitourinary: Negative for urgency, frequency, decreased urine volume and difficulty urinating.  Musculoskeletal: Negative for back pain, arthralgias and gait problem.  Skin: Negative for color change, pallor and rash.  Neurological: Negative for dizziness, light-headedness, numbness and headaches.  Hematological: Negative for adenopathy. Does not bruise/bleed easily.  Psychiatric/Behavioral: Negative for suicidal ideas, confusion, sleep disturbance, self-injury, dysphoric mood, decreased concentration and agitation.      - Objective:    BP 110/62 (BP Location:  Left Arm, Patient Position: Sitting, Cuff Size: Normal)   Pulse 97   Temp 98.1 F (36.7 C) (Oral)   Resp 16   Ht 5\' 3"  (1.6 m)   Wt 210 lb 3.2 oz (95.3 kg)   LMP 02/24/2016 Comment: currently  pregnant  SpO2 97%   BMI 37.24 kg/m  General appearance: alert, cooperative, appears stated age and no distress Head: Normocephalic, without obvious abnormality, atraumatic Eyes: conjunctivae/corneas clear. PERRL, EOM's intact. Fundi benign. Ears: normal TM's and external ear canals both ears Nose: Nares normal. Septum midline. Mucosa normal. No drainage or sinus tenderness. Throat: lips, mucosa, and tongue normal; teeth and gums normal Neck: no adenopathy, no carotid bruit, no JVD, supple, symmetrical, trachea midline and thyroid not enlarged, symmetric, no tenderness/mass/nodules Back: symmetric, no curvature. ROM normal. No CVA tenderness. Lungs: clear to auscultation bilaterally Breasts: gyn Heart: regular rate and rhythm, S1, S2 normal, no murmur, click, rub or gallop Abdomen: gravid--27 weeks Pelvic: deferred Extremities: extremities normal, atraumatic, no cyanosis or edema Pulses: 2+ and symmetric Skin: Skin color, texture, turgor normal. No rashes or lesions Lymph nodes: Cervical, supraclavicular, and axillary nodes normal. Neurologic: Alert and oriented X 3, normal strength and tone. Normal symmetric reflexes. Normal coordination and gait    Assessment:    Healthy female exam.    --- [redacted] weeks pregnant  Plan:     ghm utd Check labs  After Visit Summary for Counseling Recommendations

## 2016-08-27 NOTE — Progress Notes (Signed)
Pre visit review using our clinic review tool, if applicable. No additional management support is needed unless otherwise documented below in the visit note. 

## 2016-09-04 DIAGNOSIS — Z3689 Encounter for other specified antenatal screening: Secondary | ICD-10-CM | POA: Diagnosis not present

## 2016-09-09 ENCOUNTER — Encounter: Payer: Self-pay | Admitting: Family Medicine

## 2016-09-10 DIAGNOSIS — Z3483 Encounter for supervision of other normal pregnancy, third trimester: Secondary | ICD-10-CM | POA: Diagnosis not present

## 2016-09-10 DIAGNOSIS — Z3482 Encounter for supervision of other normal pregnancy, second trimester: Secondary | ICD-10-CM | POA: Diagnosis not present

## 2016-09-12 DIAGNOSIS — O24419 Gestational diabetes mellitus in pregnancy, unspecified control: Secondary | ICD-10-CM | POA: Diagnosis not present

## 2016-09-12 DIAGNOSIS — O133 Gestational [pregnancy-induced] hypertension without significant proteinuria, third trimester: Secondary | ICD-10-CM | POA: Diagnosis not present

## 2016-09-12 DIAGNOSIS — Z3A29 29 weeks gestation of pregnancy: Secondary | ICD-10-CM | POA: Diagnosis not present

## 2016-09-12 DIAGNOSIS — O24410 Gestational diabetes mellitus in pregnancy, diet controlled: Secondary | ICD-10-CM | POA: Diagnosis not present

## 2016-09-20 DIAGNOSIS — O133 Gestational [pregnancy-induced] hypertension without significant proteinuria, third trimester: Secondary | ICD-10-CM | POA: Diagnosis not present

## 2016-09-20 DIAGNOSIS — Z3A30 30 weeks gestation of pregnancy: Secondary | ICD-10-CM | POA: Diagnosis not present

## 2016-09-20 DIAGNOSIS — O24410 Gestational diabetes mellitus in pregnancy, diet controlled: Secondary | ICD-10-CM | POA: Diagnosis not present

## 2016-09-25 ENCOUNTER — Ambulatory Visit: Payer: BLUE CROSS/BLUE SHIELD | Admitting: Allergy and Immunology

## 2016-09-25 DIAGNOSIS — O24410 Gestational diabetes mellitus in pregnancy, diet controlled: Secondary | ICD-10-CM | POA: Diagnosis not present

## 2016-09-25 DIAGNOSIS — Z3A31 31 weeks gestation of pregnancy: Secondary | ICD-10-CM | POA: Diagnosis not present

## 2016-09-25 DIAGNOSIS — O133 Gestational [pregnancy-induced] hypertension without significant proteinuria, third trimester: Secondary | ICD-10-CM | POA: Diagnosis not present

## 2016-10-09 DIAGNOSIS — Z3A33 33 weeks gestation of pregnancy: Secondary | ICD-10-CM | POA: Diagnosis not present

## 2016-10-09 DIAGNOSIS — O133 Gestational [pregnancy-induced] hypertension without significant proteinuria, third trimester: Secondary | ICD-10-CM | POA: Diagnosis not present

## 2016-10-09 DIAGNOSIS — O24410 Gestational diabetes mellitus in pregnancy, diet controlled: Secondary | ICD-10-CM | POA: Diagnosis not present

## 2016-10-16 DIAGNOSIS — O24419 Gestational diabetes mellitus in pregnancy, unspecified control: Secondary | ICD-10-CM | POA: Diagnosis not present

## 2016-10-16 DIAGNOSIS — Z3A34 34 weeks gestation of pregnancy: Secondary | ICD-10-CM | POA: Diagnosis not present

## 2016-10-22 DIAGNOSIS — Z3A35 35 weeks gestation of pregnancy: Secondary | ICD-10-CM | POA: Diagnosis not present

## 2016-10-22 DIAGNOSIS — Z3685 Encounter for antenatal screening for Streptococcus B: Secondary | ICD-10-CM | POA: Diagnosis not present

## 2016-10-22 DIAGNOSIS — Z369 Encounter for antenatal screening, unspecified: Secondary | ICD-10-CM | POA: Diagnosis not present

## 2016-10-22 DIAGNOSIS — O24410 Gestational diabetes mellitus in pregnancy, diet controlled: Secondary | ICD-10-CM | POA: Diagnosis not present

## 2016-10-22 DIAGNOSIS — O24419 Gestational diabetes mellitus in pregnancy, unspecified control: Secondary | ICD-10-CM | POA: Diagnosis not present

## 2016-10-22 DIAGNOSIS — O133 Gestational [pregnancy-induced] hypertension without significant proteinuria, third trimester: Secondary | ICD-10-CM | POA: Diagnosis not present

## 2016-10-22 LAB — OB RESULTS CONSOLE GBS: GBS: NEGATIVE

## 2016-10-29 DIAGNOSIS — Z3A36 36 weeks gestation of pregnancy: Secondary | ICD-10-CM | POA: Diagnosis not present

## 2016-10-29 DIAGNOSIS — O24419 Gestational diabetes mellitus in pregnancy, unspecified control: Secondary | ICD-10-CM | POA: Diagnosis not present

## 2016-10-31 ENCOUNTER — Other Ambulatory Visit: Payer: Self-pay | Admitting: Obstetrics and Gynecology

## 2016-11-05 DIAGNOSIS — O133 Gestational [pregnancy-induced] hypertension without significant proteinuria, third trimester: Secondary | ICD-10-CM | POA: Diagnosis not present

## 2016-11-05 DIAGNOSIS — Z3A37 37 weeks gestation of pregnancy: Secondary | ICD-10-CM | POA: Diagnosis not present

## 2016-11-05 DIAGNOSIS — O24414 Gestational diabetes mellitus in pregnancy, insulin controlled: Secondary | ICD-10-CM | POA: Diagnosis not present

## 2016-11-08 DIAGNOSIS — O24410 Gestational diabetes mellitus in pregnancy, diet controlled: Secondary | ICD-10-CM | POA: Diagnosis not present

## 2016-11-08 DIAGNOSIS — Z3A37 37 weeks gestation of pregnancy: Secondary | ICD-10-CM | POA: Diagnosis not present

## 2016-11-08 DIAGNOSIS — O133 Gestational [pregnancy-induced] hypertension without significant proteinuria, third trimester: Secondary | ICD-10-CM | POA: Diagnosis not present

## 2016-11-11 ENCOUNTER — Encounter (HOSPITAL_COMMUNITY)
Admission: RE | Admit: 2016-11-11 | Discharge: 2016-11-11 | Disposition: A | Discharge status: Home / Self Care | Payer: BLUE CROSS/BLUE SHIELD | Source: Ambulatory Visit

## 2016-11-11 ENCOUNTER — Encounter (HOSPITAL_COMMUNITY): Payer: Self-pay | Admitting: *Deleted

## 2016-11-11 DIAGNOSIS — Z3A37 37 weeks gestation of pregnancy: Secondary | ICD-10-CM | POA: Diagnosis not present

## 2016-11-11 DIAGNOSIS — O133 Gestational [pregnancy-induced] hypertension without significant proteinuria, third trimester: Secondary | ICD-10-CM | POA: Diagnosis not present

## 2016-11-11 NOTE — Patient Instructions (Signed)
19 Karla BeetsRebekah Huber  11/11/2016   Your procedure is scheduled on:  11/12/2016  [facility] at 3:30 PM.     Call this number if you have problems the morning of surgery: [phone]  Call me at [phone] with questions or call [phone] if you get my voicemail.   Remember:   Do not eat food:After Midnight. YOU MAY EAT UNTIL 9:30.  Do not drink clear liquids: 4 Hours before arrival.THEY CHANGED TO 4 HOURS PRIOR TO ARRIVAL SO NOTHING TO DRINK AFTER 11:30.  Take these medicines the morning of surgery with A SIP OF WATER: ZYRTEC AND PROTONIX.  USE YOUR INHALERS AS USUAL AND PLEASE BRING YOUR ALBUTEROL WITH YOU.     Do not wear jewelry, make-up or nail polish.  Do not wear lotions, powders, or perfumes. Do not wear deodorant.  Do not shave 48 hours prior to surgery.  Do not bring valuables to the hospital.  [facility] not   responsible for any belongings or valuables brought to the hospital.  Contacts, dentures or bridgework may not be worn into surgery.  Leave suitcase in the car. After surgery it may be brought to your room.  For patients admitted to the hospital, checkout time is 11:00 AM the day of              discharge.   Patients discharged the day of surgery will not be allowed to drive             home.  Name and phone number of your driver: NA  Special Instructions:   N/A   Please read over the following fact sheets that you were given:   Surgical Site Infection Prevention

## 2016-11-12 ENCOUNTER — Encounter (HOSPITAL_COMMUNITY): Payer: Self-pay | Admitting: *Deleted

## 2016-11-12 ENCOUNTER — Inpatient Hospital Stay (HOSPITAL_COMMUNITY)
Admission: AD | Admit: 2016-11-12 | Discharge: 2016-11-14 | DRG: 765 | Disposition: A | Discharge status: Home / Self Care | Payer: BLUE CROSS/BLUE SHIELD | Source: Ambulatory Visit | Attending: Obstetrics and Gynecology | Admitting: Obstetrics and Gynecology

## 2016-11-12 ENCOUNTER — Inpatient Hospital Stay (HOSPITAL_COMMUNITY): Payer: BLUE CROSS/BLUE SHIELD | Admitting: Anesthesiology

## 2016-11-12 ENCOUNTER — Encounter (HOSPITAL_COMMUNITY)
Admission: AD | Disposition: A | Discharge status: Home / Self Care | Payer: Self-pay | Source: Ambulatory Visit | Attending: Obstetrics and Gynecology

## 2016-11-12 DIAGNOSIS — Z6838 Body mass index (BMI) 38.0-38.9, adult: Secondary | ICD-10-CM | POA: Diagnosis not present

## 2016-11-12 DIAGNOSIS — O34219 Maternal care for unspecified type scar from previous cesarean delivery: Secondary | ICD-10-CM | POA: Diagnosis present

## 2016-11-12 DIAGNOSIS — O24420 Gestational diabetes mellitus in childbirth, diet controlled: Secondary | ICD-10-CM | POA: Diagnosis present

## 2016-11-12 DIAGNOSIS — O34211 Maternal care for low transverse scar from previous cesarean delivery: Secondary | ICD-10-CM | POA: Diagnosis present

## 2016-11-12 DIAGNOSIS — O9952 Diseases of the respiratory system complicating childbirth: Secondary | ICD-10-CM | POA: Diagnosis not present

## 2016-11-12 DIAGNOSIS — J45909 Unspecified asthma, uncomplicated: Secondary | ICD-10-CM | POA: Diagnosis not present

## 2016-11-12 DIAGNOSIS — O1404 Mild to moderate pre-eclampsia, complicating childbirth: Principal | ICD-10-CM | POA: Diagnosis present

## 2016-11-12 DIAGNOSIS — K219 Gastro-esophageal reflux disease without esophagitis: Secondary | ICD-10-CM | POA: Diagnosis present

## 2016-11-12 DIAGNOSIS — E669 Obesity, unspecified: Secondary | ICD-10-CM | POA: Diagnosis not present

## 2016-11-12 DIAGNOSIS — O9962 Diseases of the digestive system complicating childbirth: Secondary | ICD-10-CM | POA: Diagnosis not present

## 2016-11-12 DIAGNOSIS — O9081 Anemia of the puerperium: Secondary | ICD-10-CM | POA: Diagnosis not present

## 2016-11-12 DIAGNOSIS — Z3A38 38 weeks gestation of pregnancy: Secondary | ICD-10-CM | POA: Diagnosis not present

## 2016-11-12 DIAGNOSIS — D62 Acute posthemorrhagic anemia: Secondary | ICD-10-CM | POA: Diagnosis not present

## 2016-11-12 DIAGNOSIS — O99019 Anemia complicating pregnancy, unspecified trimester: Secondary | ICD-10-CM

## 2016-11-12 DIAGNOSIS — O99214 Obesity complicating childbirth: Secondary | ICD-10-CM | POA: Diagnosis present

## 2016-11-12 DIAGNOSIS — D509 Iron deficiency anemia, unspecified: Secondary | ICD-10-CM | POA: Diagnosis not present

## 2016-11-12 DIAGNOSIS — O1494 Unspecified pre-eclampsia, complicating childbirth: Secondary | ICD-10-CM | POA: Diagnosis not present

## 2016-11-12 HISTORY — DX: Iron deficiency anemia, unspecified: D50.9

## 2016-11-12 HISTORY — DX: Anemia complicating pregnancy, unspecified trimester: O99.019

## 2016-11-12 HISTORY — DX: Acute posthemorrhagic anemia: D62

## 2016-11-12 LAB — CBC
HCT: 29.4 % — ABNORMAL LOW (ref 36.0–46.0)
Hemoglobin: 9.2 g/dL — ABNORMAL LOW (ref 12.0–15.0)
MCH: 23.6 pg — ABNORMAL LOW (ref 26.0–34.0)
MCHC: 31.3 g/dL (ref 30.0–36.0)
MCV: 75.4 fL — ABNORMAL LOW (ref 78.0–100.0)
Platelets: 325 10*3/uL (ref 150–400)
RBC: 3.9 MIL/uL (ref 3.87–5.11)
RDW: 16.6 % — ABNORMAL HIGH (ref 11.5–15.5)
WBC: 12.7 10*3/uL — ABNORMAL HIGH (ref 4.0–10.5)

## 2016-11-12 LAB — BASIC METABOLIC PANEL
Anion gap: 10 (ref 5–15)
BUN: 9 mg/dL (ref 6–20)
CO2: 18 mmol/L — ABNORMAL LOW (ref 22–32)
Calcium: 8.8 mg/dL — ABNORMAL LOW (ref 8.9–10.3)
Chloride: 103 mmol/L (ref 101–111)
Creatinine, Ser: 0.57 mg/dL (ref 0.44–1.00)
GFR calc Af Amer: >60 mL/min (ref >60)
GFR calc non Af Amer: >60 mL/min (ref >60)
Glucose, Bld: 79 mg/dL (ref 65–99)
Potassium: 4.1 mmol/L (ref 3.5–5.1)
Sodium: 131 mmol/L — ABNORMAL LOW (ref 135–145)

## 2016-11-12 LAB — TYPE AND SCREEN
ABO/RH(D): O POS
Antibody Screen: NEGATIVE

## 2016-11-12 LAB — GLUCOSE, CAPILLARY: Glucose-Capillary: 74 mg/dL (ref 65–99)

## 2016-11-12 SURGERY — Surgical Case
Anesthesia: Spinal

## 2016-11-12 MED ORDER — KETOROLAC TROMETHAMINE 30 MG/ML IJ SOLN
30.0000 mg | Freq: Four times a day (QID) | INTRAMUSCULAR | Status: DC | PRN
  Administered 2016-11-12: 30 mg via INTRAMUSCULAR

## 2016-11-12 MED ORDER — PHENYLEPHRINE 8 MG IN D5W 100 ML (0.08MG/ML) PREMIX OPTIME
INJECTION | INTRAVENOUS | Status: DC | PRN
  Administered 2016-11-12: 60 ug/min via INTRAVENOUS

## 2016-11-12 MED ORDER — MORPHINE SULFATE (PF) 0.5 MG/ML IJ SOLN
INTRAMUSCULAR | Status: AC
  Filled 2016-11-12: qty 10

## 2016-11-12 MED ORDER — MENTHOL 3 MG MT LOZG: 1.0000 | LOZENGE | OROMUCOSAL | Status: DC | PRN

## 2016-11-12 MED ORDER — COCONUT OIL OIL: 1.0000 "application " | TOPICAL_OIL | Status: DC | PRN

## 2016-11-12 MED ORDER — PRENATAL MULTIVITAMIN CH
1.0000 | ORAL_TABLET | Freq: Every day | ORAL | Status: DC
  Administered 2016-11-13 – 2016-11-14 (×2): 1 via ORAL
  Filled 2016-11-12 (×2): qty 1

## 2016-11-12 MED ORDER — METHYLERGONOVINE MALEATE 0.2 MG/ML IJ SOLN: 0.2000 mg | INTRAMUSCULAR | Status: DC | PRN

## 2016-11-12 MED ORDER — ZOLPIDEM TARTRATE 5 MG PO TABS: 5.0000 mg | ORAL_TABLET | Freq: Every evening | ORAL | Status: DC | PRN

## 2016-11-12 MED ORDER — DIBUCAINE 1 % RE OINT
1.0000 | TOPICAL_OINTMENT | RECTAL | Status: DC | PRN
Start: 2016-11-12 — End: 2016-11-15

## 2016-11-12 MED ORDER — OXYTOCIN 40 UNITS IN LACTATED RINGERS INFUSION - SIMPLE MED: 2.5000 [IU]/h | INTRAVENOUS | Status: AC

## 2016-11-12 MED ORDER — BUPIVACAINE IN DEXTROSE 0.75-8.25 % IT SOLN
INTRATHECAL | Status: DC | PRN
  Administered 2016-11-12: 10 mg via INTRATHECAL

## 2016-11-12 MED ORDER — ONDANSETRON HCL 4 MG/2ML IJ SOLN
INTRAMUSCULAR | Status: AC
  Filled 2016-11-12: qty 2

## 2016-11-12 MED ORDER — WITCH HAZEL-GLYCERIN EX PADS: 1.0000 "application " | MEDICATED_PAD | CUTANEOUS | Status: DC | PRN

## 2016-11-12 MED ORDER — METHYLERGONOVINE MALEATE 0.2 MG PO TABS: 0.2000 mg | ORAL_TABLET | ORAL | Status: DC | PRN

## 2016-11-12 MED ORDER — SCOPOLAMINE 1 MG/3DAYS TD PT72
1.0000 | MEDICATED_PATCH | TRANSDERMAL | Status: DC
  Administered 2016-11-12: 1.5 mg via TRANSDERMAL
  Filled 2016-11-12: qty 1

## 2016-11-12 MED ORDER — PHENYLEPHRINE 8 MG IN D5W 100 ML (0.08MG/ML) PREMIX OPTIME
INJECTION | INTRAVENOUS | Status: AC
  Filled 2016-11-12: qty 100

## 2016-11-12 MED ORDER — FENTANYL CITRATE (PF) 100 MCG/2ML IJ SOLN
INTRAMUSCULAR | Status: DC | PRN
  Administered 2016-11-12: 20 ug via INTRATHECAL

## 2016-11-12 MED ORDER — SCOPOLAMINE 1 MG/3DAYS TD PT72: 1.0000 | MEDICATED_PATCH | Freq: Once | TRANSDERMAL | Status: DC

## 2016-11-12 MED ORDER — BUPIVACAINE HCL (PF) 0.25 % IJ SOLN
INTRAMUSCULAR | Status: AC
  Filled 2016-11-12: qty 10

## 2016-11-12 MED ORDER — SODIUM CHLORIDE 0.9 % IJ SOLN
INTRAMUSCULAR | Status: DC | PRN
  Administered 2016-11-12: 1000 mL

## 2016-11-12 MED ORDER — ACETAMINOPHEN 325 MG PO TABS
650.0000 mg | ORAL_TABLET | ORAL | Status: DC | PRN
  Administered 2016-11-13: 650 mg via ORAL
  Filled 2016-11-12: qty 2

## 2016-11-12 MED ORDER — ONDANSETRON HCL 4 MG/2ML IJ SOLN
INTRAMUSCULAR | Status: DC | PRN
  Administered 2016-11-12: 4 mg via INTRAVENOUS

## 2016-11-12 MED ORDER — LACTATED RINGERS IV SOLN
INTRAVENOUS | Status: DC
  Administered 2016-11-13: 04:00:00 via INTRAVENOUS

## 2016-11-12 MED ORDER — OXYTOCIN 10 UNIT/ML IJ SOLN
INTRAMUSCULAR | Status: AC
  Filled 2016-11-12: qty 4

## 2016-11-12 MED ORDER — BUPIVACAINE HCL (PF) 0.25 % IJ SOLN
INTRAMUSCULAR | Status: AC
  Filled 2016-11-12: qty 20

## 2016-11-12 MED ORDER — LACTATED RINGERS IV SOLN
INTRAVENOUS | Status: DC
  Administered 2016-11-12: 125 mL via INTRAVENOUS
  Administered 2016-11-12: 18:00:00 via INTRAVENOUS

## 2016-11-12 MED ORDER — SENNOSIDES-DOCUSATE SODIUM 8.6-50 MG PO TABS
2.0000 | ORAL_TABLET | ORAL | Status: DC
  Administered 2016-11-13: 2 via ORAL
  Filled 2016-11-12: qty 2

## 2016-11-12 MED ORDER — SOD CITRATE-CITRIC ACID 500-334 MG/5ML PO SOLN
ORAL | Status: AC
  Filled 2016-11-12: qty 15

## 2016-11-12 MED ORDER — OXYTOCIN 40 UNITS IN LACTATED RINGERS INFUSION - SIMPLE MED
INTRAVENOUS | Status: AC
  Filled 2016-11-12: qty 1000

## 2016-11-12 MED ORDER — SIMETHICONE 80 MG PO CHEW
80.0000 mg | CHEWABLE_TABLET | ORAL | Status: DC
  Administered 2016-11-13: 80 mg via ORAL
  Filled 2016-11-12: qty 1

## 2016-11-12 MED ORDER — TETANUS-DIPHTH-ACELL PERTUSSIS 5-2.5-18.5 LF-MCG/0.5 IM SUSP: 0.5000 mL | Freq: Once | INTRAMUSCULAR | Status: DC

## 2016-11-12 MED ORDER — KETOROLAC TROMETHAMINE 30 MG/ML IJ SOLN: 30.0000 mg | Freq: Four times a day (QID) | INTRAMUSCULAR | Status: DC | PRN

## 2016-11-12 MED ORDER — DIPHENHYDRAMINE HCL 25 MG PO CAPS: 25.0000 mg | ORAL_CAPSULE | Freq: Four times a day (QID) | ORAL | Status: DC | PRN

## 2016-11-12 MED ORDER — BUPIVACAINE HCL (PF) 0.25 % IJ SOLN
INTRAMUSCULAR | Status: DC | PRN
  Administered 2016-11-12 (×3): 10 mL

## 2016-11-12 MED ORDER — IBUPROFEN 600 MG PO TABS
600.0000 mg | ORAL_TABLET | Freq: Four times a day (QID) | ORAL | Status: DC
  Administered 2016-11-13 – 2016-11-14 (×6): 600 mg via ORAL
  Filled 2016-11-12 (×6): qty 1

## 2016-11-12 MED ORDER — MORPHINE SULFATE (PF) 0.5 MG/ML IJ SOLN
INTRAMUSCULAR | Status: DC | PRN
Start: 2016-11-12 — End: 2016-11-12
  Administered 2016-11-12: .2 mg via INTRATHECAL

## 2016-11-12 MED ORDER — FENTANYL CITRATE (PF) 100 MCG/2ML IJ SOLN: 25.0000 ug | INTRAMUSCULAR | Status: DC | PRN

## 2016-11-12 MED ORDER — CEFAZOLIN SODIUM-DEXTROSE 2-4 GM/100ML-% IV SOLN
2.0000 g | INTRAVENOUS | Status: AC
  Administered 2016-11-12: 2 g via INTRAVENOUS

## 2016-11-12 MED ORDER — SIMETHICONE 80 MG PO CHEW: 80.0000 mg | CHEWABLE_TABLET | ORAL | Status: DC | PRN

## 2016-11-12 MED ORDER — KETOROLAC TROMETHAMINE 30 MG/ML IJ SOLN
INTRAMUSCULAR | Status: AC
  Administered 2016-11-12: 30 mg via INTRAMUSCULAR
  Filled 2016-11-12: qty 1

## 2016-11-12 MED ORDER — LACTATED RINGERS IV SOLN
INTRAVENOUS | Status: DC | PRN
  Administered 2016-11-12: 18:00:00 via INTRAVENOUS

## 2016-11-12 MED ORDER — SOD CITRATE-CITRIC ACID 500-334 MG/5ML PO SOLN
30.0000 mL | Freq: Once | ORAL | Status: AC
  Administered 2016-11-12: 30 mL via ORAL

## 2016-11-12 MED ORDER — OXYTOCIN 10 UNIT/ML IJ SOLN
INTRAVENOUS | Status: DC | PRN
  Administered 2016-11-12: 40 [IU] via INTRAVENOUS

## 2016-11-12 MED ORDER — SIMETHICONE 80 MG PO CHEW
80.0000 mg | CHEWABLE_TABLET | Freq: Three times a day (TID) | ORAL | Status: DC
  Administered 2016-11-13: 80 mg via ORAL
  Filled 2016-11-12: qty 1

## 2016-11-12 MED ORDER — FENTANYL CITRATE (PF) 100 MCG/2ML IJ SOLN
INTRAMUSCULAR | Status: AC
  Filled 2016-11-12: qty 2

## 2016-11-12 MED ORDER — ACETAMINOPHEN-CODEINE #3 300-30 MG PO TABS
1.0000 | ORAL_TABLET | ORAL | Status: DC | PRN
  Administered 2016-11-13 – 2016-11-14 (×3): 1 via ORAL
  Filled 2016-11-12 (×3): qty 1

## 2016-11-12 SURGICAL SUPPLY — 37 items
CHLORAPREP W/TINT 26ML (MISCELLANEOUS) ×2 IMPLANT
CLAMP CORD UMBIL (MISCELLANEOUS) IMPLANT
CLOTH BEACON ORANGE TIMEOUT ST (SAFETY) ×2 IMPLANT
CONTAINER PREFILL 10% NBF 15ML (MISCELLANEOUS) IMPLANT
DECANTER SPIKE VIAL GLASS SM (MISCELLANEOUS) ×4 IMPLANT
DERMABOND ADHESIVE PROPEN (GAUZE/BANDAGES/DRESSINGS) ×1
DERMABOND ADVANCED (GAUZE/BANDAGES/DRESSINGS) ×1
DERMABOND ADVANCED .7 DNX12 (GAUZE/BANDAGES/DRESSINGS) ×1 IMPLANT
DERMABOND ADVANCED .7 DNX6 (GAUZE/BANDAGES/DRESSINGS) ×1 IMPLANT
DRSG OPSITE POSTOP 4X10 (GAUZE/BANDAGES/DRESSINGS) ×4 IMPLANT
ELECT REM PT RETURN 9FT ADLT (ELECTROSURGICAL) ×2
ELECTRODE REM PT RTRN 9FT ADLT (ELECTROSURGICAL) ×1 IMPLANT
EXTRACTOR VACUUM M CUP 4 TUBE (SUCTIONS) IMPLANT
GLOVE BIO SURGEON STRL SZ7.5 (GLOVE) ×2 IMPLANT
GLOVE BIOGEL PI IND STRL 7.0 (GLOVE) ×1 IMPLANT
GLOVE BIOGEL PI INDICATOR 7.0 (GLOVE) ×1
GOWN STRL REUS W/TWL LRG LVL3 (GOWN DISPOSABLE) ×4 IMPLANT
KIT ABG SYR 3ML LUER SLIP (SYRINGE) IMPLANT
NEEDLE HYPO 22GX1.5 SAFETY (NEEDLE) ×2 IMPLANT
NEEDLE HYPO 25X5/8 SAFETYGLIDE (NEEDLE) IMPLANT
NEEDLE SPNL 20GX3.5 QUINCKE YW (NEEDLE) IMPLANT
NS IRRIG 1000ML POUR BTL (IV SOLUTION) ×2 IMPLANT
PACK C SECTION WH (CUSTOM PROCEDURE TRAY) ×2 IMPLANT
PENCIL SMOKE EVAC W/HOLSTER (ELECTROSURGICAL) ×2 IMPLANT
SUT MNCRL 0 VIOLET CTX 36 (SUTURE) ×3 IMPLANT
SUT MNCRL AB 3-0 PS2 27 (SUTURE) IMPLANT
SUT MON AB 2-0 CT1 27 (SUTURE) ×2 IMPLANT
SUT MON AB-0 CT1 36 (SUTURE) ×4 IMPLANT
SUT MONOCRYL 0 CTX 36 (SUTURE) ×3
SUT PLAIN 0 NONE (SUTURE) IMPLANT
SUT PLAIN 2 0 (SUTURE) ×1
SUT PLAIN 2 0 XLH (SUTURE) IMPLANT
SUT PLAIN ABS 2-0 CT1 27XMFL (SUTURE) ×1 IMPLANT
SYR 20CC LL (SYRINGE) IMPLANT
SYR CONTROL 10ML LL (SYRINGE) ×2 IMPLANT
TOWEL OR 17X24 6PK STRL BLUE (TOWEL DISPOSABLE) ×2 IMPLANT
TRAY FOLEY CATH SILVER 14FR (SET/KITS/TRAYS/PACK) ×2 IMPLANT

## 2016-11-12 NOTE — Anesthesia Preprocedure Evaluation (Addendum)
Anesthesia Evaluation  Patient identified by MRN, date of birth, ID band Patient awake    Reviewed: Allergy & Precautions, NPO status , Patient's Chart, lab work & pertinent test results  Airway Mallampati: II       Dental no notable dental hx.    Pulmonary asthma ,    Pulmonary exam normal        Cardiovascular hypertension, Normal cardiovascular exam     Neuro/Psych  Headaches, Mononeuropathy LE  Neuromuscular disease negative psych ROS   GI/Hepatic Neg liver ROS, GERD  Medicated and Controlled,  Endo/Other  diabetes, Well Controlled, GestationalObesity  Renal/GU negative Renal ROS  negative genitourinary   Musculoskeletal negative musculoskeletal ROS (+)   Abdominal (+) + obese,   Peds  Hematology negative hematology ROS (+)   Anesthesia Other Findings   Reproductive/Obstetrics (+) Pregnancy Previous C/section Mild pre eclampsia                            Anesthesia Physical Anesthesia Plan  ASA: II  Anesthesia Plan: Spinal   Post-op Pain Management:    Induction:   Airway Management Planned: Natural Airway  Additional Equipment:   Intra-op Plan:   Post-operative Plan:   Informed Consent: I have reviewed the patients History and Physical, chart, labs and discussed the procedure including the risks, benefits and alternatives for the proposed anesthesia with the patient or authorized representative who has indicated his/her understanding and acceptance.   Dental advisory given  Plan Discussed with: Anesthesiologist, CRNA and Surgeon  Anesthesia Plan Comments:         Anesthesia Quick Evaluation

## 2016-11-12 NOTE — Anesthesia Procedure Notes (Signed)
Spinal  Patient location during procedure: OR Start time: 11/12/2016 5:35 PM Staffing Anesthesiologist: Mal AmabileFOSTER,  Performed: anesthesiologist  Preanesthetic Checklist Completed: patient identified, site marked, surgical consent, pre-op evaluation, timeout performed, IV checked, risks and benefits discussed and monitors and equipment checked Spinal Block Patient position: sitting Prep: site prepped and draped and DuraPrep Patient monitoring: heart rate, cardiac monitor, continuous pulse ox and blood pressure Approach: midline Location: L3-4 Injection technique: single-shot Needle Needle type: Pencan  Needle gauge: 24 G Needle length: 9 cm Needle insertion depth: 6 cm Assessment Sensory level: T4 Additional Notes Patient tolerated procedure well. Adequate sensory level.

## 2016-11-12 NOTE — Transfer of Care (Signed)
Immediate Anesthesia Transfer of Care Note  Patient: Karla BeetsRebekah Huber  Procedure(s) Performed: Procedure(s): Repeat CESAREAN SECTION (N/A)  Patient Location: PACU  Anesthesia Type:Spinal  Level of Consciousness: awake  Airway & Oxygen Therapy: Patient Spontanous Breathing  Post-op Assessment: Report given to RN  Post vital signs: Reviewed and stable  Last Vitals:  Vitals:   11/12/16 1547  BP: 131/73  Pulse: 91  Resp: 20  Temp: 36.6 C    Last Pain:  Vitals:   11/12/16 1547  TempSrc: Oral         Complications: No apparent anesthesia complications

## 2016-11-12 NOTE — Anesthesia Postprocedure Evaluation (Addendum)
Anesthesia Post Note  Patient: Karla BeetsRebekah Schnabel  Procedure(s) Performed: Procedure(s) (LRB): Repeat CESAREAN SECTION (N/A)  Patient location during evaluation: PACU Anesthesia Type: Spinal Level of consciousness: awake Pain management: pain level controlled Respiratory status: spontaneous breathing Cardiovascular status: stable Postop Assessment: no headache, no backache, spinal receding, patient able to bend at knees and no signs of nausea or vomiting Anesthetic complications: no        Last Vitals:  Vitals:   11/12/16 1915 11/12/16 1920  BP:  94/65  Pulse: (!) 57 (!) 56  Resp: (!) 23 11  Temp:  36.9 C    Last Pain:  Vitals:   11/12/16 1920  TempSrc: Oral  PainSc: 3    Pain Goal:                  Karla Huber,Karla Huber 

## 2016-11-12 NOTE — H&P (Signed)
Karla Huber is a 30 y.o. female presenting for rpt csection due to new onset Mild PEC by BP and Urine PC ratio criteria..  OB History    Gravida Para Term Preterm AB Living   3 1 1   1 0   SAB TAB Ectopic Multiple Live Births   1     0       Past Medical History:  Diagnosis Date  . Asthma    pulmocort daily  . GERD (gastroesophageal reflux disease)   . Gestational diabetes    diet controlled  . Headache   . Hx of varicella   . Hypertension    pre-eclampsia  . Postpartum care following cesarean delivery (4/30) 02/25/2015  . Traumatic injury during pregnancy in third trimester 02/09/2015   Past Surgical History:  Procedure Laterality Date  . ADENOIDECTOMY    . CESAREAN SECTION N/A 02/25/2015   Procedure: CESAREAN SECTION;  Surgeon: Vaishali Mody, MD;  Location: [facility];  Service: Obstetrics;  Laterality: N/A;  . FOOT SURGERY    . KNEE SURGERY    . TONSILLECTOMY     Family History: family history includes Asthma in her sister; Cancer in her cousin; Heart disease in her mother; Hypertension in her maternal grandmother; Mitral valve prolapse in her father. Social History:  reports that she has never smoked. She has never used smokeless tobacco. She reports that she does not drink alcohol or use drugs.     Maternal Diabetes: Yes:  Diabetes Type:  Diet controlled Genetic Screening: Normal Maternal Ultrasounds/Referrals: Normal Fetal Ultrasounds or other Referrals:  None Maternal Substance Abuse:  No Significant Maternal Medications:  None Significant Maternal Lab Results:  None Other Comments:  None  Review of Systems  Constitutional: Negative.   All other systems reviewed and are negative.  Maternal Medical History:  Contractions: Onset was less than 1 hour ago.   Perceived severity is mild.    Fetal activity: Perceived fetal activity is normal.   Last perceived fetal movement was within the past hour.    Prenatal complications: Pre-eclampsia.   Prenatal  Complications - Diabetes: gestational.      Last menstrual period 02/24/2016, currently breastfeeding. Maternal Exam:  Uterine Assessment: Contraction strength is mild.  Contraction frequency is rare.   Abdomen: Patient reports no abdominal tenderness. Surgical scars: low transverse.   Fetal presentation: vertex  Introitus: Normal vulva. Normal vagina.  Ferning test: not done.  Nitrazine test: not done. Amniotic fluid character: not assessed.  Pelvis: questionable for delivery.   Cervix: Cervix evaluated by digital exam.     Physical Exam  Nursing note and vitals reviewed. Constitutional: She is oriented to person, place, and time. She appears well-developed and well-nourished.  HENT:  Head: Normocephalic and atraumatic.  Neck: Normal range of motion. Neck supple.  Cardiovascular: Normal rate and regular rhythm.   Respiratory: Effort normal and breath sounds normal.  GI: Soft. Bowel sounds are normal.  Genitourinary: Vagina normal and uterus normal.  Musculoskeletal: Normal range of motion.  Neurological: She is alert and oriented to person, place, and time.  Skin: Skin is warm and dry.    Prenatal labs: ABO, Rh: O/Positive/-- (06/27 0000) Antibody: Negative (06/27 0000) Rubella: Immune (06/27 0000) RPR: Nonreactive (06/27 0000)  HBsAg: Negative (06/27 0000)  HIV: Non-reactive (06/27 0000)  GBS:     Assessment/Plan: 38 week IUP(pt declined delivery until today, dx made 1/12) Previous csection, declines TOLAC and IOL PEC w/o severe features (Urine PC ratio .341), BP criteria   met History of PEC in last pregnancy Proceed with rpt csection. Risks vs benefits discussed. Consent done.  , J 11/12/2016, 12:18 PM    

## 2016-11-12 NOTE — Op Note (Signed)
Cesarean Section Procedure Note  Indications: previous uterine incision kerr x one and PEC without severe features  Pre-operative Diagnosis: 38 week 0 day pregnancy.  Post-operative Diagnosis: same  Surgeon: Lenoard AdenAAVON, J   Assistants: Renae FicklePaul, CNM  Anesthesia: Local anesthesia 0.25.% bupivacaine and Spinal anesthesia  ASA Class: 2  Procedure Details  The patient was seen in the Holding Room. The risks, benefits, complications, treatment options, and expected outcomes were discussed with the patient.  The patient concurred with the proposed plan, giving informed consent. The risks of anesthesia, infection, bleeding and possible injury to other organs discussed. Injury to bowel, bladder, or ureter with possible need for repair discussed. Possible need for transfusion with secondary risks of hepatitis or HIV acquisition discussed. Post operative complications to include but not limited to DVT, PE and Pneumonia noted. The site of surgery properly noted/marked. The patient was taken to Operating Room # 1, [name] and the procedure verified as C-Section Delivery. A Time Out was held and the above information confirmed.  After induction of anesthesia, the patient was draped and prepped in the usual sterile manner. A Pfannenstiel incision was made and carried down through the subcutaneous tissue to the fascia. Fascial incision was made and extended transversely using Mayo scissors. The fascia was separated from the underlying rectus tissue superiorly and inferiorly. The peritoneum was identified and entered. Peritoneal incision was extended longitudinally. The utero-vesical peritoneal reflection was incised transversely and the bladder flap was bluntly freed from the lower uterine segment. A low transverse uterine incision(Kerr hysterotomy) was made. Delivered from OA presentation was a  female with Apgar scores of 9 at one minute and 9 at five minutes. Bulb suctioning gently  performed. Neonatal team in attendance.After the umbilical cord was clamped and cut cord blood was obtained for evaluation. The placenta was removed intact and appeared normal. The uterus was curetted with a dry lap pack. Good hemostasis was noted.The uterine outline, tubes and ovaries appeared normal. The uterine incision was closed with running locked sutures of 0 Monocryl x 2 layers. Hemostasis was observed. Lavage was carried out until clear.The parietal peritoneum was closed with a running 2-0 Monocryl suture. The fascia was then reapproximated with running sutures of 0 Monocryl. The skin was reapproximated with 3-0 monocryl after [place] closure with 2-0 plain.  Instrument, sponge, and needle counts were correct prior the abdominal closure and at the conclusion of the case.   Findings: As noted  Estimated Blood Loss:  500         Drains: foley                 Specimens: placenta                 Complications:  None; patient tolerated the procedure well.         Disposition: PACU - hemodynamically stable.         Condition: stable  Attending Attestation: I performed the procedure.

## 2016-11-12 NOTE — Progress Notes (Signed)
Spoke with pharmacist and Dr. Billy Coastaavon..  Both are aware of patient's history, allergies, reaction, response.  Dr. Billy Coastaavon states he is fine with patient receiving Ancef. Called Pharmacy.

## 2016-11-13 ENCOUNTER — Encounter (HOSPITAL_COMMUNITY): Payer: Self-pay | Admitting: Obstetrics and Gynecology

## 2016-11-13 DIAGNOSIS — O99019 Anemia complicating pregnancy, unspecified trimester: Secondary | ICD-10-CM

## 2016-11-13 DIAGNOSIS — D509 Iron deficiency anemia, unspecified: Secondary | ICD-10-CM | POA: Diagnosis present

## 2016-11-13 DIAGNOSIS — D62 Acute posthemorrhagic anemia: Secondary | ICD-10-CM

## 2016-11-13 HISTORY — DX: Acute posthemorrhagic anemia: D62

## 2016-11-13 HISTORY — DX: Iron deficiency anemia, unspecified: D50.9

## 2016-11-13 LAB — CBC
HCT: 25.1 % — ABNORMAL LOW (ref 36.0–46.0)
Hemoglobin: 7.9 g/dL — ABNORMAL LOW (ref 12.0–15.0)
MCH: 23.9 pg — ABNORMAL LOW (ref 26.0–34.0)
MCHC: 31.5 g/dL (ref 30.0–36.0)
MCV: 75.8 fL — ABNORMAL LOW (ref 78.0–100.0)
Platelets: 247 10*3/uL (ref 150–400)
RBC: 3.31 MIL/uL — ABNORMAL LOW (ref 3.87–5.11)
RDW: 16.6 % — ABNORMAL HIGH (ref 11.5–15.5)
WBC: 13.4 10*3/uL — ABNORMAL HIGH (ref 4.0–10.5)

## 2016-11-13 LAB — RPR: RPR Ser Ql: NONREACTIVE

## 2016-11-13 MED ORDER — DIPHENHYDRAMINE HCL 50 MG/ML IJ SOLN: 12.5000 mg | INTRAMUSCULAR | Status: DC | PRN

## 2016-11-13 MED ORDER — POLYSACCHARIDE IRON COMPLEX 150 MG PO CAPS
150.0000 mg | ORAL_CAPSULE | Freq: Two times a day (BID) | ORAL | Status: DC
  Administered 2016-11-13 – 2016-11-14 (×3): 150 mg via ORAL
  Filled 2016-11-13 (×3): qty 1

## 2016-11-13 MED ORDER — SCOPOLAMINE 1 MG/3DAYS TD PT72
1.0000 | MEDICATED_PATCH | Freq: Once | TRANSDERMAL | Status: DC
  Filled 2016-11-13: qty 1

## 2016-11-13 MED ORDER — ONDANSETRON HCL 4 MG/2ML IJ SOLN: 4.0000 mg | Freq: Three times a day (TID) | INTRAMUSCULAR | Status: DC | PRN

## 2016-11-13 MED ORDER — NALOXONE HCL 0.4 MG/ML IJ SOLN: 0.4000 mg | INTRAMUSCULAR | Status: DC | PRN

## 2016-11-13 MED ORDER — ACETAMINOPHEN 500 MG PO TABS: 1000.0000 mg | ORAL_TABLET | Freq: Four times a day (QID) | ORAL | Status: DC

## 2016-11-13 MED ORDER — BUDESONIDE 0.25 MG/2ML IN SUSP
0.2500 mg | Freq: Two times a day (BID) | RESPIRATORY_TRACT | Status: DC
  Administered 2016-11-13 (×2): 0.25 mg via RESPIRATORY_TRACT
  Filled 2016-11-13 (×5): qty 2

## 2016-11-13 MED ORDER — MAGNESIUM OXIDE 400 (241.3 MG) MG PO TABS
400.0000 mg | ORAL_TABLET | Freq: Every day | ORAL | Status: DC
  Administered 2016-11-13 – 2016-11-14 (×2): 400 mg via ORAL
  Filled 2016-11-13 (×3): qty 1

## 2016-11-13 MED ORDER — NALOXONE HCL 2 MG/2ML IJ SOSY
1.0000 ug/kg/h | PREFILLED_SYRINGE | INTRAVENOUS | Status: DC | PRN
  Filled 2016-11-13: qty 2

## 2016-11-13 MED ORDER — DIPHENHYDRAMINE HCL 25 MG PO CAPS: 25.0000 mg | ORAL_CAPSULE | ORAL | Status: DC | PRN

## 2016-11-13 MED ORDER — SODIUM CHLORIDE 0.9% FLUSH: 3.0000 mL | INTRAVENOUS | Status: DC | PRN

## 2016-11-13 MED ORDER — IBUPROFEN 600 MG PO TABS: 600.0000 mg | ORAL_TABLET | Freq: Four times a day (QID) | ORAL | Status: DC | PRN

## 2016-11-13 NOTE — Lactation Note (Signed)
This note was copied from a baby's chart. Lactation Consultation Note  Patient Name: Karla Huber'JToday's Date: 11/13/2016 Reason for consult: Follow-up assessment (0% weight loss/ LC encouraged mom to call with feeding cues ) Baby is 20 1/2 hours old and has been to the breast several times and is post circ from midday.  Voids and stools QS for age.  Per mom recently attempted baby sleepy. LC encouraged mom to call with [facility] cues [facility] or [facility] to assess  LC. Per mom breast fed 1st baby 14 months without problems.  Mother informed of post-discharge support and given phone number to the lactation department, including services for phone call assistance; out-patient appointments; and breastfeeding support group. List of other breastfeeding resources in the community given in the handout. Encouraged mother to call for problems or concerns related to breastfeeding.  Maternal Data Does the patient have breastfeeding experience prior to this delivery?: Yes  Feeding Feeding Type: Breast Fed Length of feed: 0 min  LATCH Score/Interventions Latch: Repeated attempts needed to sustain latch, nipple held in mouth throughout feeding, stimulation needed to elicit sucking reflex. Intervention(s): Adjust position;Assist with latch;Breast massage;Breast compression  Audible Swallowing: None Intervention(s): Skin to skin;Hand expression Intervention(s): Skin to skin;Hand expression;Alternate breast massage  Type of Nipple: Flat Intervention(s): Reverse pressure  Comfort (Breast/Nipple): Soft / non-tender     Hold (Positioning): Assistance needed to correctly position infant at breast and maintain latch. Intervention(s): Breastfeeding basics reviewed;Support Pillows;Position options;Skin to skin  LATCH Score: 5  Lactation Tools Discussed/Used     Consult Status Consult Status: Follow-up Date: 11/14/16 Follow-up type: In-patient    [name]  11/13/2016, 4:36 PM

## 2016-11-13 NOTE — Progress Notes (Signed)
Patient ID: Karla BeetsRebekah Huber, female   DOB: Aug 25, 1986, 31 y.o.   MRN: [id number] Subjective: Elective Repeat Cesarean Delivery / PEC without severe features POD# 1 Information for the patient's newborn:  Karla Huber, Boy Karla Huber [[id number]][[id number]]  female  / circ planning  Reports feeling well. Feeding: breast Patient reports tolerating PO.  Breast symptoms: (+) colostrum Pain controlled with ibuprofen (OTC) and narcotic analgesics including Percocet Denies HA/SOB/C/P/N/V/dizziness. Flatus persent. No BM. She reports vaginal bleeding as normal, without clots.  She is ambulating, Foley indwelling, draining clear urine to gravity.      Objective:   VS:  Vitals:   11/12/16 2230 11/12/16 2330 11/13/16 0030 11/13/16 0500  BP: 125/69 126/77 113/61   Pulse: 67 63 91   Resp: 18 18 18 18   Temp: 98.4 F (36.9 C) 98.3 F (36.8 C) 98.2 F (36.8 C) 98.2 F (36.8 C)  TempSrc: Oral Oral Oral Oral  SpO2: 95% 95% 94% 96%  Weight:      Height:         Intake/Output Summary (Last 24 hours) at 11/13/16 0911 Last data filed at 11/13/16 0500  Gross per 24 hour  Intake             2100 ml  Output             1500 ml  Net              600 ml        Recent Labs  11/12/16 1610 11/13/16 0510  WBC 12.7* 13.4*  HGB 9.2* 7.9*  HCT 29.4* 25.1*  PLT 325 247     Blood type: --/--/O POS (01/16 1600)  Rubella: Immune (06/27 0000)     Physical Exam:   General: alert, cooperative and no distress  CV: Regular rate and rhythm, S1S2 present or without murmur or extra heart sounds  Resp: clear  Abdomen: soft, nontender, normal bowel sounds  Incision: clean, dry, intact and skin well-approximated with suture  Uterine Fundus: firm, 1 FB below umbilicus, nontender  Lochia: none  Ext: extremities normal, atraumatic, no cyanosis or edema, Homans sign is negative, no sign of DVT and no edema, redness or tenderness in the calves or thighs   Assessment/Plan: 31 y.o.   POD# 1.  S/P Cesarean Delivery.  Indications:  elective repeat / PEC without severe features                Principal Problem:   Postpartum care following repeat cesarean delivery (1/16) Active Problems:   Previous cesarean delivery affecting pregnancy   Iron deficiency anemia of pregnancy   Acute blood loss anemia  Doing well, stable.               Regular diet as tolerated D/C foley & IV per unit protocol Ambulate Routine post-op care Start Iron 150 mg po BID and Magnesium Oxide 400 mg po daily  Raelyn MoraAWSON, , M, MSN, CNM 11/13/2016, 9:10 AM

## 2016-11-14 MED ORDER — ACETAMINOPHEN-CODEINE #3 300-30 MG PO TABS: 1.0000 | ORAL_TABLET | ORAL | 0 refills | Status: DC | PRN

## 2016-11-14 MED ORDER — POLYSACCHARIDE IRON COMPLEX 150 MG PO CAPS: 150.0000 mg | ORAL_CAPSULE | Freq: Two times a day (BID) | ORAL | 0 refills | Status: DC

## 2016-11-14 MED ORDER — MAGNESIUM OXIDE 400 (241.3 MG) MG PO TABS: 400.0000 mg | ORAL_TABLET | Freq: Every day | ORAL | 0 refills | Status: DC

## 2016-11-14 MED ORDER — IBUPROFEN 600 MG PO TABS: 600.0000 mg | ORAL_TABLET | Freq: Four times a day (QID) | ORAL | 0 refills | Status: DC

## 2016-11-14 NOTE — Discharge Instructions (Signed)
Iron-Rich Diet  Introduction Iron is a mineral that helps your body to produce hemoglobin. Hemoglobin is a protein in your red blood cells that carries oxygen to your body's tissues. Eating too little iron may cause you to feel weak and tired, and it can increase your risk for infection. Eating enough iron is necessary for your body's metabolism, muscle function, and nervous system. Iron is naturally found in many foods. It can also be added to foods or fortified in foods. There are two types of dietary iron:  Heme iron. Heme iron is absorbed by the body more easily than nonheme iron. Heme iron is found in meat, poultry, and fish.  Nonheme iron. Nonheme iron is found in dietary supplements, iron-fortified grains, beans, and vegetables. You may need to follow an iron-rich diet if:  You have been diagnosed with iron deficiency or iron-deficiency anemia.  You have a condition that prevents you from absorbing dietary iron, such as:  Infection in your intestines.  Celiac disease. This involves long-lasting (chronic) inflammation of your intestines.  You do not eat enough iron.  You eat a diet that is high in foods that impair iron absorption.  You have lost a lot of blood.  You have heavy bleeding during your menstrual cycle.  You are pregnant. What is my plan? Your health care provider may help you to determine how much iron you need per day based on your condition. Generally, when a person consumes sufficient amounts of iron in the diet, the following iron needs are met:  Men.  58-30 years old: 11 mg per day.  39-5 years old: 8 mg per day.  Women.  51-53 years old: 15 mg per day.  97-38 years old: 18 mg per day.  Over 4 years old: 8 mg per day.  Pregnant women: 27 mg per day.  Breastfeeding women: 9 mg per day. What do I need to know about an iron-rich diet?  Eat fresh fruits and vegetables that are high in vitamin C along with foods that are high in iron. This will  help increase the amount of iron that your body absorbs from food, especially with foods containing nonheme iron. Foods that are high in vitamin C include oranges, peppers, tomatoes, and mango.  Take iron supplements only as directed by your health care provider. Overdose of iron can be life-threatening. If you were prescribed iron supplements, take them with orange juice or a vitamin C supplement.  Cook foods in pots and pans that are made from iron.  Eat nonheme iron-containing foods alongside foods that are high in heme iron. This helps to improve your iron absorption.  Certain foods and drinks contain compounds that impair iron absorption. Avoid eating these foods in the same meal as iron-rich foods or with iron supplements. These include:  Coffee, black tea, and red wine.  Milk, dairy products, and foods that are high in calcium.  Beans, soybeans, and peas.  Whole grains.  When eating foods that contain both nonheme iron and compounds that impair iron absorption, follow these tips to absorb iron better.  Soak beans overnight before cooking.  Soak whole grains overnight and drain them before using.  Ferment flours before baking, such as using yeast in bread dough. What foods can I eat? Grains  Iron-fortified breakfast cereal. Iron-fortified whole-wheat bread. Enriched rice. Sprouted grains. Vegetables  Spinach. Potatoes with skin. Green peas. Broccoli. Red and green bell peppers. Fermented vegetables. Fruits  Prunes. Raisins. Oranges. Strawberries. Mango. Grapefruit. Meats and Other  Sources  Beef liver. Oysters. Beef. Shrimp. Turkey. Chicken. Tuna. Sardines. Chickpeas. Nuts. Tofu. Beverages  Tomato juice. Fresh orange juice. Prune juice. Hibiscus tea. Fortified instant breakfast shakes. Condiments  Tahini. Fermented soy sauce. Sweets and Desserts  Black-strap molasses. Other  Wheat germ. The items listed above may not be a complete list of recommended foods or  beverages. Contact your dietitian for more options.  What foods are not recommended? Grains  Whole grains. Bran cereal. Bran flour. Oats. Vegetables  Artichokes. Brussels sprouts. Kale. Fruits  Blueberries. Raspberries. Strawberries. Figs. Meats and Other Protein Sources  Soybeans. Products made from soy protein. Dairy  Milk. Cream. Cheese. Yogurt. Cottage cheese. Beverages  Coffee. Black tea. Red wine. Sweets and Desserts  Cocoa. Chocolate. Ice cream. Other  Basil. Oregano. Parsley. The items listed above may not be a complete list of foods and beverages to avoid. Contact your dietitian for more information.  This information is not intended to replace advice given to you by your health care provider. Make sure you discuss any questions you have with your health care provider. Document Released: 05/28/2005 Document Revised: 05/03/2016 Document Reviewed: 05/11/2014  2017 Elsevier  

## 2016-11-14 NOTE — Progress Notes (Signed)
POSTOPERATIVE DAY # 2 S/P CS   S:         Reports feeling well - wants to go home             Tolerating po intake / no nausea / no vomiting / + flatus / no BM             Bleeding is light             Pain controlled with motrin and Tylenol #3             Up ad lib / ambulatory/ voiding QS  Newborn breast feeding  O:  VS: BP (!) 108/50 (BP Location: Right Arm)   Pulse (!) 57   Temp 97.8 F (36.6 C) (Oral)   Resp 18   Ht 5\' 3"  (1.6 m)   Wt 99.3 kg (219 lb)   LMP 02/24/2016   SpO2 98%   Breastfeeding? Unknown   BMI 38.79 kg/m    LABS:               Recent Labs  11/12/16 1610 11/13/16 0510  WBC 12.7* 13.4*  HGB 9.2* 7.9*  PLT 325 247               Bloodtype: --/--/O POS (01/16 1600)  Rubella: Immune (06/27 0000)                                            I&O: Intake/Output      01/17 0701 - 01/18 0700 01/18 0701 - 01/19 0700   I.V. (mL/kg)     Total Intake(mL/kg)     Urine (mL/kg/hr) 5950 (2.5)    Blood     Total Output 5950     Net -5950                     Physical Exam:             Alert and Oriented X3  Lungs: Clear and unlabored  Heart: regular rate and rhythm / no mumurs  Abdomen: soft, non-tender, non-distended             Fundus: firm, non-tender, U-1             Dressing intact             Incision:  approximated with suture / no erythema / no ecchymosis / no drainage  Lochia: light  Extremities: trace edema, no calf pain or tenderness, negative Homans  A:        POD # 2 S/P CS            IDA with compounded ABL anemia  P:        Routine postoperative care              DC home     Marlinda MikeBAILEY, TANYA CNM, MSN, [facility] 11/14/2016, 10:02 AM

## 2016-11-14 NOTE — Lactation Note (Signed)
This note was copied from a baby's chart. Lactation Consultation Note: exp BF mom reports baby has just finished feeding for 20 min. Reports baby fed a lot through the night. Reports nipples are intact only slightly tender. Has DEBP for home. No questions at present. Reviewed our phone number to call with questions/concerns.  Patient Name: Boy Karla BeetsRebekah Huber NWGNF'AToday's Date: 11/14/2016 Reason for consult: Follow-up assessment   Maternal Data Formula Feeding for Exclusion: No Has patient been taught Hand Expression?: Yes Does the patient have breastfeeding experience prior to this delivery?: Yes  Feeding    LATCH Score/Interventions                      Lactation Tools Discussed/Used WIC Program: No   Consult Status Consult Status: Complete    Pamelia HoitWeeks,  D 11/14/2016, 10:08 AM

## 2016-11-14 NOTE — Discharge Summary (Signed)
OB Discharge Summary  Patient Name: Karla Huber DOB: 01/29/1986 MRN: [id number]  Date of admission: 11/12/2016  Admitting diagnosis: PREG Previous Cesarean Section Intrauterine pregnancy: [redacted]w[redacted]d     Secondary diagnosis: Preeclampsia   Date of discharge: 11/14/2016     Discharge diagnosis: Term Pregnancy Delivered, Preeclampsia (mild), Anemia and POD 2 s/p repeat c-section      Prenatal history: [id number]   EDC : 11/26/2016, by Other Basis  Prenatal care at [facility]  Prenatal course complicated by hx previous cesarean section / mild preeclampsia / IDA of pregnancy  Prenatal Labs: ABO, Rh: --/--/O POS (01/16 1600)  Antibody: NEG (01/16 1600) Rubella: Immune (06/27 0000)  RPR: Non Reactive (01/16 1610)  HBsAg: Negative (06/27 0000)  HIV: Non-reactive (06/27 0000)  GBS: Negative (12/26 0000)                                    Hospital course:  Sceduled C/S   31 y.o. yo [id number] at [redacted]w[redacted]d was admitted to the hospital 11/12/2016 for scheduled cesarean section with the following indication:Elective Repeat.  Membrane Rupture Time/Date: 5:56 PM ,11/12/2016   Patient delivered a Viable infant.11/12/2016  Details of operation can be found in separate operative note.  Pateint had an uncomplicated postpartum course.  She is ambulating, tolerating a regular diet, passing flatus, and urinating well. Patient is discharged home in stable condition on  11/14/16        Delivering PROVIDER: Olivia Mackie                                                            Complications: None  Newborn Data: Live born female  Birth Weight: 7 lb 7.4 oz (3384 g) APGAR: 9, 9  Baby Feeding: Breast Disposition:home with mother  Post partum procedures:none  Postpartum contraception: Not Discussed    Labs: Lab Results  Component Value Date   WBC 13.4 (H) 11/13/2016   HGB 7.9 (L) 11/13/2016   HCT 25.1 (L) 11/13/2016   MCV 75.8 (L) 11/13/2016   PLT 247 11/13/2016   CMP  Latest Ref Rng & Units 11/12/2016  Glucose 65 - 99 mg/dL 79  BUN 6 - 20 mg/dL 9  Creatinine 0.86 - 5.78 mg/dL 4.69  Sodium 629 - 528 mmol/L 131(L)  Potassium 3.5 - 5.1 mmol/L 4.1  Chloride 101 - 111 mmol/L 103  CO2 22 - 32 mmol/L 18(L)  Calcium 8.9 - 10.3 mg/dL 4.1(L)  Total Protein 6.0 - 8.3 g/dL -  Total Bilirubin 0.2 - 1.2 mg/dL -  Alkaline Phos 39 - 244 U/L -  AST 0 - 37 U/L -  ALT 0 - 35 U/L -    Physical Exam @ time of discharge:  Vitals:   11/13/16 0950 11/13/16 1345 11/13/16 1748 11/14/16 0606  BP: 125/60 (!) 125/57 131/60 (!) 108/50  Pulse: 61 75 72 (!) 57  Resp: 18 18 18 18   Temp: 98.3 F (36.8 C) 97.2 F (36.2 C) 98.2 F (36.8 C) 97.8 F (36.6 C)  TempSrc: Oral Oral Oral Oral  SpO2: 98%     Weight:      Height:        General: alert, cooperative  and no distress Lochia: appropriate Uterine Fundus: firm Perineum: intact Incision: Healing well with no significant drainage Extremities: DVT Evaluation: No evidence of DVT seen on physical exam.   Discharge instructions:  "Baby and Me Booklet" and [facility] Booklet  Discharge Medications:  Allergies as of 11/14/2016      Reactions   Amoxicillin Hives   Has patient had a PCN reaction causing immediate rash, facial/tongue/throat swelling, SOB or lightheadedness with hypotension: {no Has patient had a PCN reaction causing severe rash involving mucus membranes or skin necrosis: {no Has patient had a PCN reaction that required hospitalization no Has patient had a PCN reaction occurring within the last 10 years: {yes If all of the above answers are "NO", then may proceed with Cephalosporin use.   Hydrocodone Hives   Has tolerated Percocet in the past   Pine    Tdap [diphth-acell Pertussis-tetanus] Other (See Comments)   Fever, Joint stiffness      Medication List    TAKE these medications   acetaminophen-codeine 300-30 MG tablet Commonly known as:  TYLENOL #3 Take 1-2 tablets by mouth every 4 (four) hours  as needed for moderate pain.   albuterol 108 (90 Base) MCG/ACT inhaler Commonly known as:  PROVENTIL HFA;VENTOLIN HFA Use 2 puffs every four hours as needed for cough or wheeze.  May use 2 puffs 10-20 minutes prior to exercise.   aspirin 81 MG chewable tablet Chew 81 mg by mouth daily.   budesonide 180 MCG/ACT inhaler Commonly known as:  PULMICORT FLEXHALER Use 2 puffs twice daily to prevent cough or wheeze.  Rinse, gargle, and spit after use.   cetirizine 10 MG tablet Commonly known as:  ZYRTEC Take 10 mg by mouth daily. Used in Spring & Fall, for seasonal allergies.   ibuprofen 600 MG tablet Commonly known as:  ADVIL,MOTRIN Take 1 tablet (600 mg total) by mouth every 6 (six) hours.   iron polysaccharides 150 MG capsule Commonly known as:  NIFEREX Take 1 capsule (150 mg total) by mouth 2 (two) times daily.   magnesium oxide 400 (241.3 Mg) MG tablet Commonly known as:  MAG-OX Take 1 tablet (400 mg total) by mouth daily. Start taking on:  11/15/2016   pantoprazole 20 MG tablet Commonly known as:  PROTONIX Take 20 mg by mouth daily.   prenatal multivitamin Tabs tablet Take 1 tablet by mouth daily with supper.       Diet: routine diet  Activity: Advance as tolerated. Pelvic rest x 6 weeks.   Follow up:1 week BP recheck    Signed: Marlinda MikeBAILEY,  CNM, MSN, [facility] 11/14/2016, 10:24 AM

## 2016-11-20 DIAGNOSIS — O1405 Mild to moderate pre-eclampsia, complicating the puerperium: Secondary | ICD-10-CM | POA: Diagnosis not present

## 2016-12-11 DIAGNOSIS — H52222 Regular astigmatism, left eye: Secondary | ICD-10-CM | POA: Diagnosis not present

## 2017-03-28 DIAGNOSIS — Z8632 Personal history of gestational diabetes: Secondary | ICD-10-CM | POA: Diagnosis not present

## 2017-04-04 NOTE — Addendum Note (Signed)
Addendum  created 04/04/17 0922 by , , MD   Sign clinical note    

## 2017-06-23 DIAGNOSIS — O9123 Nonpurulent mastitis associated with lactation: Secondary | ICD-10-CM | POA: Diagnosis not present

## 2017-06-23 DIAGNOSIS — B379 Candidiasis, unspecified: Secondary | ICD-10-CM | POA: Diagnosis not present

## 2017-06-23 DIAGNOSIS — T3695XA Adverse effect of unspecified systemic antibiotic, initial encounter: Secondary | ICD-10-CM | POA: Diagnosis not present

## 2017-06-23 DIAGNOSIS — N6489 Other specified disorders of breast: Secondary | ICD-10-CM | POA: Diagnosis not present

## 2017-07-09 ENCOUNTER — Encounter: Payer: Self-pay | Admitting: Family Medicine

## 2017-07-09 ENCOUNTER — Ambulatory Visit (INDEPENDENT_AMBULATORY_CARE_PROVIDER_SITE_OTHER): Payer: BLUE CROSS/BLUE SHIELD | Admitting: Family Medicine

## 2017-07-09 ENCOUNTER — Ambulatory Visit: Payer: Self-pay | Admitting: Family Medicine

## 2017-07-09 VITALS — BP 116/79 | HR 85 | Temp 98.9°F | Wt 175.0 lb

## 2017-07-09 DIAGNOSIS — M25551 Pain in right hip: Secondary | ICD-10-CM | POA: Diagnosis not present

## 2017-07-09 DIAGNOSIS — M9909 Segmental and somatic dysfunction of abdomen and other regions: Secondary | ICD-10-CM

## 2017-07-09 DIAGNOSIS — M9900 Segmental and somatic dysfunction of head region: Secondary | ICD-10-CM | POA: Diagnosis not present

## 2017-07-09 DIAGNOSIS — M542 Cervicalgia: Secondary | ICD-10-CM

## 2017-07-09 DIAGNOSIS — M9905 Segmental and somatic dysfunction of pelvic region: Secondary | ICD-10-CM | POA: Diagnosis not present

## 2017-07-09 DIAGNOSIS — M25552 Pain in left hip: Secondary | ICD-10-CM

## 2017-07-09 DIAGNOSIS — M9904 Segmental and somatic dysfunction of sacral region: Secondary | ICD-10-CM

## 2017-07-09 DIAGNOSIS — M9908 Segmental and somatic dysfunction of rib cage: Secondary | ICD-10-CM

## 2017-07-09 DIAGNOSIS — M9902 Segmental and somatic dysfunction of thoracic region: Secondary | ICD-10-CM
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
## 2017-07-31 ENCOUNTER — Ambulatory Visit (INDEPENDENT_AMBULATORY_CARE_PROVIDER_SITE_OTHER): Payer: BLUE CROSS/BLUE SHIELD | Admitting: Family Medicine

## 2017-07-31 ENCOUNTER — Encounter: Payer: Self-pay | Admitting: Family Medicine

## 2017-07-31 VITALS — BP 108/73 | HR 101 | Temp 99.2°F | Wt 180.5 lb

## 2017-07-31 DIAGNOSIS — M9900 Segmental and somatic dysfunction of head region: Secondary | ICD-10-CM

## 2017-07-31 DIAGNOSIS — M9904 Segmental and somatic dysfunction of sacral region: Secondary | ICD-10-CM

## 2017-07-31 DIAGNOSIS — M9908 Segmental and somatic dysfunction of rib cage: Secondary | ICD-10-CM | POA: Diagnosis not present

## 2017-07-31 DIAGNOSIS — M9902 Segmental and somatic dysfunction of thoracic region: Secondary | ICD-10-CM | POA: Diagnosis not present

## 2017-07-31 DIAGNOSIS — M542 Cervicalgia: Secondary | ICD-10-CM

## 2017-07-31 DIAGNOSIS — M9905 Segmental and somatic dysfunction of pelvic region: Secondary | ICD-10-CM

## 2017-07-31 DIAGNOSIS — M9909 Segmental and somatic dysfunction of abdomen and other regions: Secondary | ICD-10-CM | POA: Diagnosis not present

## 2017-07-31 DIAGNOSIS — M9901 Segmental and somatic dysfunction of cervical region: Secondary | ICD-10-CM

## 2017-07-31 NOTE — Progress Notes (Signed)
BP 108/73 (BP Location: Left Arm, Patient Position: Sitting, Cuff Size: Normal)   Pulse (!) 101   Temp 99.2 F (37.3 C)   Wt 180 lb 8 oz (81.9 kg)   SpO2 98%   BMI 31.97 kg/m    Subjective:    Patient ID: Karla Huber, female    DOB: 02-21-1986, 31 y.o.   MRN: [id number]  HPI: Karla Huber is a 31 y.o. female  Chief Complaint  Patient presents with  . Neck Pain   Karla Huber is doing OK. Her son has been sick, so her sleep has been off a little bit. She notes that her hips have been doing better. She has really been working on stretching them out and they are not bothering her as much as they were. Her neck has been OK lately. It's really pretty hit or miss in terms of the pain. Some days it bothers her more than others. She continues to describe it as aching in the base of her neck and the upper part of her neck. She states that it's worse with stress and better with ibuprofen, massage and OMT. It does radiate into the shoulders a bit and usually happens at the beginning and end of the day. She notes that she was a little sore after last time, but did OK and has in general been feeling a bit better. She usually just deals with her pain, but feels like it has been acting up less. She has really been diligent about her exercise and stretching and also feels like that has been helping. She is otherwise feeling well with no other concerns or complaints at this time.   Relevant past medical, surgical, family and social history reviewed and updated as indicated. Interim medical history since our last visit reviewed. Allergies and medications reviewed and updated.  Review of Systems  Constitutional: Negative.   Respiratory: Negative.   Cardiovascular: Negative.   Musculoskeletal: Positive for myalgias, neck pain and neck stiffness. Negative for arthralgias, back pain, gait problem and joint swelling.  Psychiatric/Behavioral: Negative.     Per HPI unless specifically indicated above     Objective:    BP 108/73 (BP Location: Left Arm, Patient Position: Sitting, Cuff Size: Normal)   Pulse (!) 101   Temp 99.2 F (37.3 C)   Wt 180 lb 8 oz (81.9 kg)   SpO2 98%   BMI 31.97 kg/m   Wt Readings from Last 3 Encounters:  07/31/17 180 lb 8 oz (81.9 kg)  07/09/17 175 lb (79.4 kg)  11/12/16 219 lb (99.3 kg)    Physical Exam  Constitutional: She is oriented to person, place, and time. She appears well-developed and well-nourished. No distress.  HENT:  Head: Normocephalic and atraumatic.  Right Ear: Hearing normal.  Left Ear: Hearing normal.  Nose: Nose normal.  Eyes: Conjunctivae and lids are normal. Right eye exhibits no discharge. Left eye exhibits no discharge. No scleral icterus.  Cardiovascular: Normal rate, regular rhythm, normal heart sounds and intact distal pulses.  Exam reveals no gallop and no friction rub.   No murmur heard. Pulmonary/Chest: Effort normal and breath sounds normal. No respiratory distress. She has no wheezes. She has no rales. She exhibits no tenderness.  Neurological: She is alert and oriented to person, place, and time.  Skin: Skin is warm, dry and intact. No rash noted. She is not diaphoretic. No erythema. No pallor.  Psychiatric: She has a normal mood and affect. Her speech is normal and behavior is normal.  Judgment and thought content normal. Cognition and memory are normal.  Nursing note and vitals reviewed. Musculoskeletal:  Exam found Decreased ROM, Tissue texture changes, Tenderness to palpation and Asymmetry of patient's  head, neck, thorax, ribs, pelvis, sacrum and abdomen Osteopathic Structural Exam:   Head: hypertonic suboccipital muscles, OAESSR, R torsion  Neck: C3ESRR, C4ESRL, SCM hypertonic on the R- improved  Thorax: T3-5SLRR, trap spasm on the R  Ribs: Ribs 4-6 locked up on the R  Pelvis: anterior R innominate  Sacrum: R on R torsion  Abdomen: slight diaphragm hypertonicity on the R, celiac ganglion restriction  Results for  orders placed or performed during the hospital encounter of 11/12/16  OB RESULT CONSOLE Group B Strep  Result Value Ref Range   GBS Negative   Basic metabolic panel  Result Value Ref Range   Sodium 131 (L) 135 - 145 mmol/L   Potassium 4.1 3.5 - 5.1 mmol/L   Chloride 103 101 - 111 mmol/L   CO2 18 (L) 22 - 32 mmol/L   Glucose, Bld 79 65 - 99 mg/dL   BUN 9 6 - 20 mg/dL   Creatinine, Ser 9.60 0.44 - 1.00 mg/dL   Calcium 8.8 (L) 8.9 - 10.3 mg/dL   GFR calc non Af Amer >60 >60 mL/min   GFR calc Af Amer >60 >60 mL/min   Anion gap 10 5 - 15  CBC  Result Value Ref Range   WBC 12.7 (H) 4.0 - 10.5 K/uL   RBC 3.90 3.87 - 5.11 MIL/uL   Hemoglobin 9.2 (L) 12.0 - 15.0 g/dL   HCT 45.4 (L) 09.8 - 11.9 %   MCV 75.4 (L) 78.0 - 100.0 fL   MCH 23.6 (L) 26.0 - 34.0 pg   MCHC 31.3 30.0 - 36.0 g/dL   RDW 14.7 (H) 82.9 - 56.2 %   Platelets 325 150 - 400 K/uL  RPR  Result Value Ref Range   RPR Ser Ql Non Reactive Non Reactive  Glucose, capillary  Result Value Ref Range   Glucose-Capillary 74 65 - 99 mg/dL  CBC  Result Value Ref Range   WBC 13.4 (H) 4.0 - 10.5 K/uL   RBC 3.31 (L) 3.87 - 5.11 MIL/uL   Hemoglobin 7.9 (L) 12.0 - 15.0 g/dL   HCT 13.0 (L) 86.5 - 78.4 %   MCV 75.8 (L) 78.0 - 100.0 fL   MCH 23.9 (L) 26.0 - 34.0 pg   MCHC 31.5 30.0 - 36.0 g/dL   RDW 69.6 (H) 29.5 - 28.4 %   Platelets 247 150 - 400 K/uL  Type and screen [facility]  Result Value Ref Range   ABO/RH(D) O POS    Antibody Screen NEG    Sample Expiration 11/15/2016       Assessment & Plan:   Problem List Items Addressed This Visit    None    Visit Diagnoses    Neck pain    -  Primary   Has been working on stretching. Improved, still a bit sore. Has somatic dysfunction that would benefit from OMT- treated today as below with good results.    Head region somatic dysfunction       Thoracic segment dysfunction       Somatic dysfunction of sacral region       Pelvic somatic dysfunction        Somatic dysfunction of rib region       Segmental dysfunction of abdomen       Cervical (  neck) region somatic dysfunction         After verbal consent was obtained, patient was treated today with osteopathic manipulative medicine to the regions of the head, neck, thorax, ribs, pelvis, sacrum and abdomen using the techniques of cranial, myofascial release, counterstrain, HVLA and soft tissue. Areas of compensation relating to her primary pain source also treated. Patient tolerated the procedure well with good objective and good subjective improvement in symptoms. She left the room in good condition. She was advised to stay well hydrated and that she may have some soreness following the procedure. If not improving or worsening, she will call and come in.  She will return for reevaluation  in 1-2 months.   Follow up plan: Return in about 4 weeks (around 08/28/2017) for for OMT eval if needed.

## 2017-08-29 ENCOUNTER — Ambulatory Visit (INDEPENDENT_AMBULATORY_CARE_PROVIDER_SITE_OTHER): Payer: BLUE CROSS/BLUE SHIELD | Admitting: Family Medicine

## 2017-08-29 ENCOUNTER — Encounter: Payer: BLUE CROSS/BLUE SHIELD | Admitting: Family Medicine

## 2017-08-29 ENCOUNTER — Encounter: Payer: Self-pay | Admitting: Family Medicine

## 2017-08-29 VITALS — BP 110/66 | HR 87 | Temp 98.2°F | Resp 16 | Ht 63.0 in | Wt 182.0 lb

## 2017-08-29 DIAGNOSIS — Z0000 Encounter for general adult medical examination without abnormal findings: Secondary | ICD-10-CM

## 2017-08-29 DIAGNOSIS — J4520 Mild intermittent asthma, uncomplicated: Secondary | ICD-10-CM

## 2017-08-29 LAB — CBC WITH DIFFERENTIAL/PLATELET
Basophils Absolute: 0 10*3/uL (ref 0.0–0.1)
Basophils Relative: 0.3 % (ref 0.0–3.0)
Eosinophils Absolute: 0.1 10*3/uL (ref 0.0–0.7)
Eosinophils Relative: 0.7 % (ref 0.0–5.0)
HCT: 33.6 % — ABNORMAL LOW (ref 36.0–46.0)
Hemoglobin: 10.6 g/dL — ABNORMAL LOW (ref 12.0–15.0)
Lymphocytes Relative: 11.5 % — ABNORMAL LOW (ref 12.0–46.0)
Lymphs Abs: 1.4 10*3/uL (ref 0.7–4.0)
MCHC: 31.5 g/dL (ref 30.0–36.0)
MCV: 76.2 fl — ABNORMAL LOW (ref 78.0–100.0)
Monocytes Absolute: 0.5 10*3/uL (ref 0.1–1.0)
Monocytes Relative: 4.3 % (ref 3.0–12.0)
Neutro Abs: 9.9 10*3/uL — ABNORMAL HIGH (ref 1.4–7.7)
Neutrophils Relative %: 83.2 % — ABNORMAL HIGH (ref 43.0–77.0)
Platelets: 402 10*3/uL — ABNORMAL HIGH (ref 150.0–400.0)
RBC: 4.41 Mil/uL (ref 3.87–5.11)
RDW: 15.7 % — ABNORMAL HIGH (ref 11.5–15.5)
WBC: 11.9 10*3/uL — ABNORMAL HIGH (ref 4.0–10.5)

## 2017-08-29 LAB — COMPREHENSIVE METABOLIC PANEL
ALT: 11 U/L (ref 0–35)
AST: 10 U/L (ref 0–37)
Albumin: 4.6 g/dL (ref 3.5–5.2)
Alkaline Phosphatase: 68 U/L (ref 39–117)
BUN: 10 mg/dL (ref 6–23)
CO2: 26 mEq/L (ref 19–32)
Calcium: 9.5 mg/dL (ref 8.4–10.5)
Chloride: 104 mEq/L (ref 96–112)
Creatinine, Ser: 0.65 mg/dL (ref 0.40–1.20)
GFR: 112.14 mL/min (ref >60.00)
Glucose, Bld: 102 mg/dL — ABNORMAL HIGH (ref 70–99)
Potassium: 3.9 mEq/L (ref 3.5–5.1)
Sodium: 138 mEq/L (ref 135–145)
Total Bilirubin: 0.5 mg/dL (ref 0.2–1.2)
Total Protein: 7.8 g/dL (ref 6.0–8.3)

## 2017-08-29 LAB — POC URINALSYSI DIPSTICK (AUTOMATED)
Bilirubin, UA: NEGATIVE
Blood, UA: NEGATIVE
Glucose, UA: NEGATIVE
Ketones, UA: NEGATIVE
Leukocytes, UA: NEGATIVE
Nitrite, UA: NEGATIVE
Protein, UA: NEGATIVE
Spec Grav, UA: <=1.005 — AB (ref 1.010–1.025)
Urobilinogen, UA: 0.2 E.U./dL
pH, UA: 6 (ref 5.0–8.0)

## 2017-08-29 LAB — LIPID PANEL
Cholesterol: 153 mg/dL (ref 0–200)
HDL: 68.2 mg/dL (ref >39.00)
LDL Cholesterol: 74 mg/dL (ref 0–99)
NonHDL: 84.38
Total CHOL/HDL Ratio: 2
Triglycerides: 50 mg/dL (ref 0.0–149.0)
VLDL: 10 mg/dL (ref 0.0–40.0)

## 2017-08-29 LAB — TSH: TSH: 1.08 u[IU]/mL (ref 0.35–4.50)

## 2017-08-29 MED ORDER — BUDESONIDE 180 MCG/ACT IN AEPB
INHALATION_SPRAY | RESPIRATORY_TRACT | 3 refills | Status: DC
Start: 2017-08-29 — End: 2018-06-26

## 2017-08-29 NOTE — Progress Notes (Signed)
Subjective:     Karla Huber is a 31 y.o. female and is here for a comprehensive physical exam. The patient reports no problems.  Social History   Social History  . Marital status: Married    Spouse name: N/A  . Number of children: N/A  . Years of education: N/A   Occupational History  . umemployed    Social History Main Topics  . Smoking status: Never Smoker  . Smokeless tobacco: Never Used  . Alcohol use No  . Drug use: No  . Sexual activity: Yes    Birth control/ protection: None   Other Topics Concern  . Not on file   Social History Narrative   Exercises try 3-5 times a week for 30-5940min      Health Maintenance  Topic Date Due  . PAP SMEAR  06/16/2016  . INFLUENZA VACCINE  05/28/2017  . TETANUS/TDAP  10/28/2035 (Originally 10/29/2015)  . HIV Screening  Completed    The following portions of the patient's history were reviewed and updated as appropriate:  She  has a past medical history of Acute blood loss anemia (11/13/2016); Asthma; GERD (gastroesophageal reflux disease); Gestational diabetes; Headache; varicella; Hypertension; Iron deficiency anemia of pregnancy (11/13/2016); Postpartum care following cesarean delivery (4/30) (02/25/2015); Postpartum care following repeat cesarean delivery (1/16) (11/13/2016); Preeclampsia; and Traumatic injury during pregnancy in third trimester (02/09/2015). She  does not have any pertinent problems on file. She  has a past surgical history that includes Tonsillectomy; Foot surgery; Knee surgery; Cesarean section (N/A, 02/25/2015); Adenoidectomy; and Cesarean section (N/A, 11/12/2016). Her family history includes Asthma in her sister; Cancer in her cousin; Heart disease in her mother; Hypertension in her maternal grandmother; Mitral valve prolapse in her father. She  reports that she has never smoked. She has never used smokeless tobacco. She reports that she does not drink alcohol or use drugs. She has a current medication list which  includes the following prescription(s): albuterol, budesonide, cetirizine, and omeprazole. Current Outpatient Prescriptions on File Prior to Visit  Medication Sig Dispense Refill  . albuterol (PROVENTIL HFA;VENTOLIN HFA) 108 (90 Base) MCG/ACT inhaler Use 2 puffs every four hours as needed for cough or wheeze.  May use 2 puffs 10-20 minutes prior to exercise. 1 Inhaler 1  . cetirizine (ZYRTEC) 10 MG tablet Take 10 mg by mouth daily. Used in Spring & Fall, for seasonal allergies.     No current facility-administered medications on file prior to visit.    She is allergic to amoxicillin; hydrocodone; pine; and tdap [diphth-acell pertussis-tetanus]..  Review of Systems Review of Systems  Constitutional: Negative for activity change, appetite change and fatigue.  HENT: Negative for hearing loss, congestion, tinnitus and ear discharge.  dentist [id number] Eyes: Negative for visual disturbance (see optho q1y -- vision corrected to 20/20 with glasses).  Respiratory: Negative for cough, chest tightness and shortness of breath.   Cardiovascular: Negative for chest pain, palpitations and leg swelling.  Gastrointestinal: Negative for abdominal pain, diarrhea, constipation and abdominal distention.  Genitourinary: Negative for urgency, frequency, decreased urine volume and difficulty urinating.  Musculoskeletal: Negative for back pain, arthralgias and gait problem.  Skin: Negative for color change, pallor and rash.  Neurological: Negative for dizziness, light-headedness, numbness and headaches.  Hematological: Negative for adenopathy. Does not bruise/bleed easily.  Psychiatric/Behavioral: Negative for suicidal ideas, confusion, sleep disturbance, self-injury, dysphoric mood, decreased concentration and agitation.       Objective:    BP 110/66 (BP Location: Left Arm, Cuff Size: Large)  Pulse 87   Temp 98.2 F (36.8 C) (Oral)   Resp 16   Ht 5\' 3"  (1.6 m)   Wt 182 lb (82.6 kg)   LMP 08/03/2017   SpO2  98%   BMI 32.24 kg/m  General appearance: alert, cooperative, appears stated age and no distress Head: Normocephalic, without obvious abnormality, atraumatic Eyes: conjunctivae/corneas clear. PERRL, EOM's intact. Fundi benign. Ears: normal TM's and external ear canals both ears Nose: Nares normal. Septum midline. Mucosa normal. No drainage or sinus tenderness. Throat: lips, mucosa, and tongue normal; teeth and gums normal Neck: no adenopathy, no carotid bruit, no JVD, supple, symmetrical, trachea midline and thyroid not enlarged, symmetric, no tenderness/mass/nodules Back: symmetric, no curvature. ROM normal. No CVA tenderness. Lungs: clear to auscultation bilaterally Breasts: gyn Heart: regular rate and rhythm, S1, S2 normal, no murmur, click, rub or gallop Abdomen: soft, non-tender; bowel sounds normal; no masses,  no organomegaly Pelvic: deferred--gyn Extremities: extremities normal, atraumatic, no cyanosis or edema Pulses: 2+ and symmetric Skin: Skin color, texture, turgor normal. No rashes or lesions Lymph nodes: Cervical, supraclavicular, and axillary nodes normal. Neurologic: Alert and oriented X 3, normal strength and tone. Normal symmetric reflexes. Normal coordination and gait    Assessment:    Healthy female exam.      Plan:    ghm utd Check labs See After Visit Summary for Counseling Recommendations

## 2017-08-29 NOTE — Telephone Encounter (Signed)
I think the last one was when she was pregnant or right after delivery if I havi

## 2017-08-29 NOTE — Patient Instructions (Signed)
Preventive Care 18-39 Years, Female Preventive care refers to lifestyle choices and visits with your health care provider that can promote health and wellness. What does preventive care include?  A yearly physical exam. This is also called an annual well check.  Dental exams once or twice a year.  Routine eye exams. Ask your health care provider how often you should have your eyes checked.  Personal lifestyle choices, including: ? Daily care of your teeth and gums. ? Regular physical activity. ? Eating a healthy diet. ? Avoiding tobacco and drug use. ? Limiting alcohol use. ? Practicing safe sex. ? Taking vitamin and mineral supplements as recommended by your health care provider. What happens during an annual well check? The services and screenings done by your health care provider during your annual well check will depend on your age, overall health, lifestyle risk factors, and family history of disease. Counseling Your health care provider may ask you questions about your:  Alcohol use.  Tobacco use.  Drug use.  Emotional well-being.  Home and relationship well-being.  Sexual activity.  Eating habits.  Work and work Statistician.  Method of birth control.  Menstrual cycle.  Pregnancy history.  Screening You may have the following tests or measurements:  Height, weight, and BMI.  Diabetes screening. This is done by checking your blood sugar (glucose) after you have not eaten for a while (fasting).  Blood pressure.  Lipid and cholesterol levels. These may be checked every 5 years starting at age 66.  Skin check.  Hepatitis C blood test.  Hepatitis B blood test.  Sexually transmitted disease (STD) testing.  BRCA-related cancer screening. This may be done if you have a family history of breast, ovarian, tubal, or peritoneal cancers.  Pelvic exam and Pap test. This may be done every 3 years starting at age 40. Starting at age 59, this may be done every 5  years if you have a Pap test in combination with an HPV test.  Discuss your test results, treatment options, and if necessary, the need for more tests with your health care provider. Vaccines Your health care provider may recommend certain vaccines, such as:  Influenza vaccine. This is recommended every year.  Tetanus, diphtheria, and acellular pertussis (Tdap, Td) vaccine. You may need a Td booster every 10 years.  Varicella vaccine. You may need this if you have not been vaccinated.  HPV vaccine. If you are 69 or younger, you may need three doses over 6 months.  Measles, mumps, and rubella (MMR) vaccine. You may need at least one dose of MMR. You may also need a second dose.  Pneumococcal 13-valent conjugate (PCV13) vaccine. You may need this if you have certain conditions and were not previously vaccinated.  Pneumococcal polysaccharide (PPSV23) vaccine. You may need one or two doses if you smoke cigarettes or if you have certain conditions.  Meningococcal vaccine. One dose is recommended if you are age 27-21 years and a first-year college student living in a residence hall, or if you have one of several medical conditions. You may also need additional booster doses.  Hepatitis A vaccine. You may need this if you have certain conditions or if you travel or work in places where you may be exposed to hepatitis A.  Hepatitis B vaccine. You may need this if you have certain conditions or if you travel or work in places where you may be exposed to hepatitis B.  Haemophilus influenzae type b (Hib) vaccine. You may need this if  you have certain risk factors.  Talk to your health care provider about which screenings and vaccines you need and how often you need them. This information is not intended to replace advice given to you by your health care provider. Make sure you discuss any questions you have with your health care provider. Document Released: 12/10/2001 Document Revised: 07/03/2016  Document Reviewed: 08/15/2015 Elsevier Interactive Patient Education  2017 [employer].

## 2017-08-31 NOTE — Telephone Encounter (Signed)
9 months ago it was high but that is when she had her baby---- I believe It was slightly elevated prior to that Can we add a peripheral smear?  --- if not ---repeat n 6-8 weeks cbcd, with peripheral smear

## 2017-09-03 ENCOUNTER — Encounter: Payer: Self-pay | Admitting: Family Medicine

## 2017-09-03 ENCOUNTER — Ambulatory Visit (INDEPENDENT_AMBULATORY_CARE_PROVIDER_SITE_OTHER): Payer: BLUE CROSS/BLUE SHIELD | Admitting: Family Medicine

## 2017-09-03 VITALS — BP 116/81 | HR 59 | Wt 180.2 lb

## 2017-09-03 DIAGNOSIS — M542 Cervicalgia: Secondary | ICD-10-CM

## 2017-09-03 DIAGNOSIS — M9902 Segmental and somatic dysfunction of thoracic region: Secondary | ICD-10-CM | POA: Diagnosis not present

## 2017-09-03 DIAGNOSIS — M9900 Segmental and somatic dysfunction of head region: Secondary | ICD-10-CM | POA: Diagnosis not present

## 2017-09-03 DIAGNOSIS — M9904 Segmental and somatic dysfunction of sacral region: Secondary | ICD-10-CM

## 2017-09-03 DIAGNOSIS — M9901 Segmental and somatic dysfunction of cervical region: Secondary | ICD-10-CM

## 2017-09-03 DIAGNOSIS — M9908 Segmental and somatic dysfunction of rib cage: Secondary | ICD-10-CM

## 2017-09-03 DIAGNOSIS — M9905 Segmental and somatic dysfunction of pelvic region: Secondary | ICD-10-CM

## 2017-09-03 DIAGNOSIS — M9909 Segmental and somatic dysfunction of abdomen and other regions: Secondary | ICD-10-CM

## 2017-09-03 NOTE — Patient Instructions (Addendum)
Diastasis Recti Diastasis recti is when the muscles of the abdomen (rectus abdominis muscles) become thin and separate. The result is a wider space between the right and left abdomen (abdominal) muscles. This wider space between the muscles may cause a bulge in the middle of your abdomen. You may notice this bulge when you are straining or when you sit up from a lying down position. Diastasis recti can affect men and women. It is most common among pregnant women, infants, people who are obese, and people who have had abdominal surgery. Exercise or surgical treatment may help correct it. What are the causes? Common causes of this condition include:  Pregnancy. The growing uterus puts pressure on the abdominal muscles, which causes the muscles to separate.  Obesity. Excess fat puts pressure on abdominal muscles.  Weightlifting.  Some abdomen exercises.  Advanced age.  Genetics.  Prior abdominal surgery.  What increases the risk? This condition is more likely to develop in:  Women.  Newborns, especially newborns who are born early (prematurely).  What are the signs or symptoms? Common symptoms of this condition include:  A bulge in the middle of the abdomen. You will notice it most when you sit up or strain.  Pain in the low back, pelvis, or hips.  Constipation.  Inability to control when you urinate (urinary incontinence).  Bloating.  Poor posture.  How is this diagnosed? This condition is diagnosed with a physical exam. Your health care provider will ask you to lie flat on your back and do a crunch or half sit-up. If you have diastasis recti, a vertical bulge will appear between your abdominal muscles in the center of your abdomen. Your health care provider will measure the gap between your muscles with one of the following:  A medical device used to measure the space between two objects (caliper).  A tape measure.  CT scan.  Ultrasound.  Finger spaces. Your health  care provider will measure the space using their fingers.  How is this treated? If your muscle separation is not too large, you may not need treatment. However, if you are a woman who plans to become pregnant again, you should treat this condition before your next pregnancy. Treatment may include:  Physical therapy to strengthen and tighten your abdominal muscles.  Lifestyle changes such as weight loss and exercise.  Over-the-counter pain medicines as needed.  Surgery to correct the separation.  Follow these instructions at home: Activity  Return to your normal activities as told by your health care provider. Ask your health care provider what activities are safe for you.  When lifting weights or doing exercises using your abdominal muscles or the muscles in the center of your body that give stability (core muscles), make sure you are doing your exercises and movements correctly. Proper form can help to prevent the condition from happening again. General instructions  If you are overweight, ask your health care provider for help with weight loss. Losing even a small amount of weight can help to improve your diastasis recti.  Take over-the-counter or prescription medicines only as told by your health care provider.  Do not strain. Straining can make the separation worse. Examples of straining include: ? Pushing hard to have a bowel movement, such as due to constipation. ? Lifting heavy objects, including children. ? Standing up and sitting down.  Take steps to prevent constipation: ? Drink enough fluid to keep your urine clear or pale yellow. ? Take over-the-counter or prescription medicines only as   directed. ? Eat foods that are high in fiber, such as fresh fruits and vegetables, whole grains, and beans. ? Limit foods that are high in fat and processed sugars, such as fried and sweet foods. Contact a health care provider if:  You notice a new bulge in your abdomen. Get help  right away if:  You experience severe discomfort in your abdomen.  You develop severe abdominal pain along with nausea, vomiting, or fever. Summary  Diastasis recti is when the abdomen (abdominal) muscles become thin and separate. Your abdomen will stick out because the space between your right and left abdomen muscles has widened.  The most common symptom is a bulge in your abdomen. You will notice it most when you sit up or are straining.  This condition is diagnosed during a physical exam.  If the abdomen separation is not too big, you may choose not to have treatment. Otherwise, you may need to undergo physical therapy or surgery. This information is not intended to replace advice given to you by your health care provider. Make sure you discuss any questions you have with your health care provider. Document Released: 12/09/2016 Document Revised: 12/09/2016 Document Reviewed: 12/09/2016 Elsevier Interactive Patient Education  2018 [employer]  

## 2017-09-03 NOTE — Progress Notes (Signed)
BP 116/81 (BP Location: Left Arm, Patient Position: Sitting, Cuff Size: Normal)   Pulse (!) 59   Wt 180 lb 3 oz (81.7 kg)   BMI 31.92 kg/m    Subjective:    Patient ID: Karla BeetsRebekah Huber, female    DOB: 1986-05-14, 31 y.o.   MRN: [id number]  HPI: Karla BeetsRebekah Huber is a 31 y.o. female  Chief Complaint  Patient presents with  . Neck Pain   Karla Huber is not doing great today. She is currently in an acute exacerbation of her chronic neck pain. She is not sure where it came from. She notes that she has been having pain in her neck for about 2 weeks, into her L trap, L shoulder and upper back. It's aching, tight and sore in nature. Has been having horrible headaches. Ibuprofen and heat and ice helping, carrying babies making it worse. No other signs or symptoms. Otherwise feeling well. She notes that OMT is very helpful and would like to try it today for her neck pain.   Relevant past medical, surgical, family and social history reviewed and updated as indicated. Interim medical history since our last visit reviewed. Allergies and medications reviewed and updated.  Review of Systems  Constitutional: Negative.   Respiratory: Negative.   Cardiovascular: Negative.   Musculoskeletal: Positive for myalgias, neck pain and neck stiffness. Negative for arthralgias, back pain, gait problem and joint swelling.  Neurological: Positive for headaches. Negative for dizziness, tremors, seizures, syncope, facial asymmetry, speech difficulty, weakness, light-headedness and numbness.  Psychiatric/Behavioral: Negative.     Per HPI unless specifically indicated above     Objective:    BP 116/81 (BP Location: Left Arm, Patient Position: Sitting, Cuff Size: Normal)   Pulse (!) 59   Wt 180 lb 3 oz (81.7 kg)   BMI 31.92 kg/m   Wt Readings from Last 3 Encounters:  09/03/17 180 lb 3 oz (81.7 kg)  08/29/17 182 lb (82.6 kg)  07/31/17 180 lb 8 oz (81.9 kg)    Physical Exam  Constitutional: She is oriented to  person, place, and time. She appears well-developed and well-nourished. No distress.  HENT:  Head: Normocephalic and atraumatic.  Right Ear: Hearing normal.  Left Ear: Hearing normal.  Nose: Nose normal.  Eyes: Conjunctivae and lids are normal. Right eye exhibits no discharge. Left eye exhibits no discharge. No scleral icterus.  Pulmonary/Chest: Effort normal. No respiratory distress.  Abdominal: Soft. She exhibits no distension and no mass. There is no tenderness. There is no rebound and no guarding.  Neurological: She is alert and oriented to person, place, and time.  Skin: Skin is warm, dry and intact. No rash noted. She is not diaphoretic. No erythema. No pallor.  Psychiatric: She has a normal mood and affect. Her speech is normal and behavior is normal. Judgment and thought content normal. Cognition and memory are normal.  Nursing note and vitals reviewed.  Musculoskeletal:  Exam found Decreased ROM, Tissue texture changes, Tenderness to palpation and Asymmetry of patient's  head, neck, thorax, ribs, pelvis, sacrum and abdomen Osteopathic Structural Exam:   Head: lateral pterygoid spasm on the R, masseter hypertonicity on the R, OAESSR, OM suture resticted on the R, dural strain into neck, inferior vertical strain  Neck: C3ESRR, C4ESRL, SCM hypertonic bilaterally R>L, trap spasm on the L  Thorax: T3-5SLRR, trap spasm on the R  Ribs: Ribs 3-9 locked up on the L  Pelvis: Anterior R innominate  Sacrum: R on R torsion  Abdomen: diaphragm spasm  on the L  Results for orders placed or performed in visit on 08/29/17  CBC with Differential/Platelet  Result Value Ref Range   WBC 11.9 (H) 4.0 - 10.5 K/uL   RBC 4.41 3.87 - 5.11 Mil/uL   Hemoglobin 10.6 (L) 12.0 - 15.0 g/dL   HCT 16.1 (L) 09.6 - 04.5 %   MCV 76.2 (L) 78.0 - 100.0 fl   MCHC 31.5 30.0 - 36.0 g/dL   RDW 40.9 (H) 81.1 - 91.4 %   Platelets 402.0 (H) 150.0 - 400.0 K/uL   Neutrophils Relative % 83.2 (H) 43.0 - 77.0 %    Lymphocytes Relative 11.5 (L) 12.0 - 46.0 %   Monocytes Relative 4.3 3.0 - 12.0 %   Eosinophils Relative 0.7 0.0 - 5.0 %   Basophils Relative 0.3 0.0 - 3.0 %   Neutro Abs 9.9 (H) 1.4 - 7.7 K/uL   Lymphs Abs 1.4 0.7 - 4.0 K/uL   Monocytes Absolute 0.5 0.1 - 1.0 K/uL   Eosinophils Absolute 0.1 0.0 - 0.7 K/uL   Basophils Absolute 0.0 0.0 - 0.1 K/uL  Comprehensive metabolic panel  Result Value Ref Range   Sodium 138 135 - 145 mEq/L   Potassium 3.9 3.5 - 5.1 mEq/L   Chloride 104 96 - 112 mEq/L   CO2 26 19 - 32 mEq/L   Glucose, Bld 102 (H) 70 - 99 mg/dL   BUN 10 6 - 23 mg/dL   Creatinine, Ser 7.82 0.40 - 1.20 mg/dL   Total Bilirubin 0.5 0.2 - 1.2 mg/dL   Alkaline Phosphatase 68 39 - 117 U/L   AST 10 0 - 37 U/L   ALT 11 0 - 35 U/L   Total Protein 7.8 6.0 - 8.3 g/dL   Albumin 4.6 3.5 - 5.2 g/dL   Calcium 9.5 8.4 - 95.6 mg/dL   GFR 213.08 >65.78 mL/min  Lipid panel  Result Value Ref Range   Cholesterol 153 0 - 200 mg/dL   Triglycerides 46.9 0.0 - 149.0 mg/dL   HDL 62.95 >28.41 mg/dL   VLDL 32.4 0.0 - 40.1 mg/dL   LDL Cholesterol 74 0 - 99 mg/dL   Total CHOL/HDL Ratio 2    NonHDL 84.38   TSH  Result Value Ref Range   TSH 1.08 0.35 - 4.50 uIU/mL  POCT Urinalysis Dipstick (Automated)  Result Value Ref Range   Color, UA yellow    Clarity, UA clear    Glucose, UA neg    Bilirubin, UA neg    Ketones, UA neg    Spec Grav, UA <=1.005 (A) 1.010 - 1.025   Blood, UA neg    pH, UA 6.0 5.0 - 8.0   Protein, UA neg    Urobilinogen, UA 0.2 0.2 or 1.0 E.U./dL   Nitrite, UA neg    Leukocytes, UA Negative Negative      Assessment & Plan:   Problem List Items Addressed This Visit    None    Visit Diagnoses    Neck pain    -  Primary   In acute exacerbation. Appears to be myofascial in nature. Would benefit from OMT- trying to avoid medicine as she's breast feeding. Treated today as below.    Head region somatic dysfunction       Thoracic segment dysfunction       Somatic  dysfunction of sacral region       Pelvic somatic dysfunction       Somatic dysfunction of rib region  Segmental dysfunction of abdomen       Cervical (neck) region somatic dysfunction         After verbal consent was obtained, patient was treated today with osteopathic manipulative medicine to the regions of the head, neck, thorax, ribs, pelvis, sacrum and abdomen using the techniques of cranial, myofascial release, counterstrain, muscle energy, HVLA and soft tissue. Areas of compensation relating to her primary pain source also treated. Patient tolerated the procedure well with good objective and good subjective improvement in symptoms. She left the room in good condition. She was advised to stay well hydrated and that she may have some soreness following the procedure. If not improving or worsening, she will call and come in. Home exercise program of stretches for abdomen discussed and demonstrated today. Patient will do these stretches BID to before the point of pain, and will return for reevaluation  In 3-4 weeks.   Follow up plan: Return in about 4 weeks (around 10/01/2017) for eval of neck pain.

## 2017-09-05 ENCOUNTER — Other Ambulatory Visit: Payer: Self-pay | Admitting: *Deleted

## 2017-09-05 ENCOUNTER — Telehealth: Payer: Self-pay | Admitting: *Deleted

## 2017-09-05 DIAGNOSIS — D72829 Elevated white blood cell count, unspecified: Secondary | ICD-10-CM

## 2017-09-05 NOTE — Telephone Encounter (Signed)
Health provider screening form faxed.

## 2017-10-03 ENCOUNTER — Encounter: Payer: Self-pay | Admitting: Family Medicine

## 2017-10-03 ENCOUNTER — Ambulatory Visit (INDEPENDENT_AMBULATORY_CARE_PROVIDER_SITE_OTHER): Payer: BLUE CROSS/BLUE SHIELD | Admitting: Family Medicine

## 2017-10-03 VITALS — BP 120/75 | HR 76 | Temp 98.3°F | Wt 182.5 lb

## 2017-10-03 DIAGNOSIS — M9903 Segmental and somatic dysfunction of lumbar region: Secondary | ICD-10-CM

## 2017-10-03 DIAGNOSIS — M9900 Segmental and somatic dysfunction of head region: Secondary | ICD-10-CM | POA: Diagnosis not present

## 2017-10-03 DIAGNOSIS — M542 Cervicalgia: Secondary | ICD-10-CM | POA: Diagnosis not present

## 2017-10-03 DIAGNOSIS — M25551 Pain in right hip: Secondary | ICD-10-CM

## 2017-10-03 DIAGNOSIS — M9902 Segmental and somatic dysfunction of thoracic region: Secondary | ICD-10-CM | POA: Diagnosis not present

## 2017-10-03 DIAGNOSIS — M9908 Segmental and somatic dysfunction of rib cage: Secondary | ICD-10-CM

## 2017-10-03 DIAGNOSIS — M9905 Segmental and somatic dysfunction of pelvic region: Secondary | ICD-10-CM

## 2017-10-03 DIAGNOSIS — M9901 Segmental and somatic dysfunction of cervical region: Secondary | ICD-10-CM

## 2017-10-03 DIAGNOSIS — M9909 Segmental and somatic dysfunction of abdomen and other regions: Secondary | ICD-10-CM

## 2017-10-03 DIAGNOSIS — M9904 Segmental and somatic dysfunction of sacral region: Secondary | ICD-10-CM

## 2017-10-03 DIAGNOSIS — M25552 Pain in left hip: Secondary | ICD-10-CM

## 2017-10-03 NOTE — Progress Notes (Signed)
BP 120/75 (BP Location: Left Arm, Patient Position: Sitting, Cuff Size: Normal)   Pulse 76   Temp 98.3 F (36.8 C)   Wt 182 lb 8 oz (82.8 kg)   SpO2 100%   BMI 32.33 kg/m    Subjective:    Patient ID: Karla Huber, female    DOB: Sep 13, 1986, 31 y.o.   MRN: [id number]  HPI: Karla BeetsRebekah Allcorn is a 31 y.o. female  Chief Complaint  Patient presents with  . Neck Pain   Neck has been bothering off and on. She had a really bad week last week and almost called to come in earlier, but her kids were sick. Her daughter was throwing up one night and she ended up sleeping on the floor of her daughter's room for most of the night and had been really tight and sore since then. She notes that her pain is in the base of her head and neck. It's aching and tight. It radiates into her head and into her upper back. Better with OMT and with stretching. Worse with stress. She also notes that her SI joint and hip flexors have been bothering her. She had increased her workouts and has gone back down. No different since she did that. Her pain in her hips are aching and sore and tight. Better with stretching. She is unsure what is making it worse. She is otherwise feeling well, nervous about the snow, but otherwise doing well.    Relevant past medical, surgical, family and social history reviewed and updated as indicated. Interim medical history since our last visit reviewed. Allergies and medications reviewed and updated.  Review of Systems  Constitutional: Negative.   Respiratory: Negative.   Cardiovascular: Negative.   Musculoskeletal: Positive for back pain, myalgias, neck pain and neck stiffness. Negative for arthralgias, gait problem and joint swelling.  Skin: Negative.   Neurological: Negative.   Psychiatric/Behavioral: Negative.     Per HPI unless specifically indicated above     Objective:    BP 120/75 (BP Location: Left Arm, Patient Position: Sitting, Cuff Size: Normal)   Pulse 76   Temp  98.3 F (36.8 C)   Wt 182 lb 8 oz (82.8 kg)   SpO2 100%   BMI 32.33 kg/m   Wt Readings from Last 3 Encounters:  10/03/17 182 lb 8 oz (82.8 kg)  09/03/17 180 lb 3 oz (81.7 kg)  08/29/17 182 lb (82.6 kg)    Physical Exam  Constitutional: She is oriented to person, place, and time. She appears well-developed and well-nourished. No distress.  HENT:  Head: Normocephalic and atraumatic.  Right Ear: Hearing normal.  Left Ear: Hearing normal.  Nose: Nose normal.  Eyes: Conjunctivae and lids are normal. Right eye exhibits no discharge. Left eye exhibits no discharge. No scleral icterus.  Pulmonary/Chest: Effort normal. No respiratory distress.  Abdominal: Soft. She exhibits no distension and no mass. There is no tenderness. There is no rebound and no guarding.  Neurological: She is alert and oriented to person, place, and time.  Skin: Skin is warm, dry and intact. No rash noted. She is not diaphoretic. No erythema. No pallor.  Psychiatric: She has a normal mood and affect. Her speech is normal and behavior is normal. Judgment and thought content normal. Cognition and memory are normal.  Musculoskeletal:  Exam found Decreased ROM, Tissue texture changes, Tenderness to palpation and Asymmetry of patient's  head, neck, thorax, ribs, lumbar, pelvis, sacrum and abdomen Osteopathic Structural Exam:   Head: OAESSR, hypertonic  suboccipital muscles, R torsion  Neck: SCM hypertonic bilaterally, C3ESRR, C4ESRL  Thorax: T3-5SLRR  Ribs: Ribs 3-10 locked down on the R, rib 4 locked up on the L  Lumbar: QL spasm on the R, L3-5SLRR  Pelvis: Posterior R innominate   Sacrum: R on R torsion, SI joint restricted on the R  Abdomen: diaphragm spasm on the L   Results for orders placed or performed in visit on 08/29/17  CBC with Differential/Platelet  Result Value Ref Range   WBC 11.9 (H) 4.0 - 10.5 K/uL   RBC 4.41 3.87 - 5.11 Mil/uL   Hemoglobin 10.6 (L) 12.0 - 15.0 g/dL   HCT 40.9 (L) 81.1 - 91.4 %    MCV 76.2 (L) 78.0 - 100.0 fl   MCHC 31.5 30.0 - 36.0 g/dL   RDW 78.2 (H) 95.6 - 21.3 %   Platelets 402.0 (H) 150.0 - 400.0 K/uL   Neutrophils Relative % 83.2 (H) 43.0 - 77.0 %   Lymphocytes Relative 11.5 (L) 12.0 - 46.0 %   Monocytes Relative 4.3 3.0 - 12.0 %   Eosinophils Relative 0.7 0.0 - 5.0 %   Basophils Relative 0.3 0.0 - 3.0 %   Neutro Abs 9.9 (H) 1.4 - 7.7 K/uL   Lymphs Abs 1.4 0.7 - 4.0 K/uL   Monocytes Absolute 0.5 0.1 - 1.0 K/uL   Eosinophils Absolute 0.1 0.0 - 0.7 K/uL   Basophils Absolute 0.0 0.0 - 0.1 K/uL  Comprehensive metabolic panel  Result Value Ref Range   Sodium 138 135 - 145 mEq/L   Potassium 3.9 3.5 - 5.1 mEq/L   Chloride 104 96 - 112 mEq/L   CO2 26 19 - 32 mEq/L   Glucose, Bld 102 (H) 70 - 99 mg/dL   BUN 10 6 - 23 mg/dL   Creatinine, Ser 0.86 0.40 - 1.20 mg/dL   Total Bilirubin 0.5 0.2 - 1.2 mg/dL   Alkaline Phosphatase 68 39 - 117 U/L   AST 10 0 - 37 U/L   ALT 11 0 - 35 U/L   Total Protein 7.8 6.0 - 8.3 g/dL   Albumin 4.6 3.5 - 5.2 g/dL   Calcium 9.5 8.4 - 57.8 mg/dL   GFR 469.62 >95.28 mL/min  Lipid panel  Result Value Ref Range   Cholesterol 153 0 - 200 mg/dL   Triglycerides 41.3 0.0 - 149.0 mg/dL   HDL 24.40 >10.27 mg/dL   VLDL 25.3 0.0 - 66.4 mg/dL   LDL Cholesterol 74 0 - 99 mg/dL   Total CHOL/HDL Ratio 2    NonHDL 84.38   TSH  Result Value Ref Range   TSH 1.08 0.35 - 4.50 uIU/mL  POCT Urinalysis Dipstick (Automated)  Result Value Ref Range   Color, UA yellow    Clarity, UA clear    Glucose, UA neg    Bilirubin, UA neg    Ketones, UA neg    Spec Grav, UA <=1.005 (A) 1.010 - 1.025   Blood, UA neg    pH, UA 6.0 5.0 - 8.0   Protein, UA neg    Urobilinogen, UA 0.2 0.2 or 1.0 E.U./dL   Nitrite, UA neg    Leukocytes, UA Negative Negative      Assessment & Plan:   Problem List Items Addressed This Visit    None    Visit Diagnoses    Neck pain    -  Primary   In exacerbation, likely myofascial from sleeping on the floor. She does  have some somatic  dysfunction that would benefit from OMT. Tx'd today with good result   Pain of both hip joints       Likely myofascial from sleeping on the floor. She does have some somatic dysfunction that would benefit from OMT. Treated today with good results.    Head region somatic dysfunction       Thoracic segment dysfunction       Somatic dysfunction of sacral region       Somatic dysfunction of rib region       Cervical (neck) region somatic dysfunction       Somatic dysfunction of pelvis region       Segmental dysfunction of abdomen       Somatic dysfunction of lumbar region        After verbal consent was obtained, patient was treated today with osteopathic manipulative medicine to the regions of the head, neck, thorax, ribs, lumbar, pelvis, sacrum and abdomen using the techniques of cranial, myofascial release, counterstrain, muscle energy, HVLA and soft tissue. Areas of compensation relating to her primary pain source also treated. Patient tolerated the procedure well with good objective and good subjective improvement in symptoms. She left the room in good condition. She was advised to stay well hydrated and that she may have some soreness following the procedure. If not improving or worsening, she will call and come in.  She will return for reevaluation  In 3-4 weeks.    Follow up plan: Return in about 4 weeks (around 10/31/2017) for Procedure visit.

## 2017-10-03 NOTE — Patient Instructions (Signed)
[facility]

## 2017-10-13 DIAGNOSIS — J018 Other acute sinusitis: Secondary | ICD-10-CM | POA: Diagnosis not present

## 2017-10-24 ENCOUNTER — Other Ambulatory Visit (INDEPENDENT_AMBULATORY_CARE_PROVIDER_SITE_OTHER): Payer: BLUE CROSS/BLUE SHIELD

## 2017-10-24 DIAGNOSIS — D72829 Elevated white blood cell count, unspecified: Secondary | ICD-10-CM

## 2017-10-24 LAB — CBC WITH DIFFERENTIAL/PLATELET
Basophils Absolute: 21 cells/uL (ref 0–200)
Basophils Relative: 0.3 %
Eosinophils Absolute: 98 cells/uL (ref 15–500)
Eosinophils Relative: 1.4 %
HCT: 32.7 % — ABNORMAL LOW (ref 35.0–45.0)
Hemoglobin: 10.4 g/dL — ABNORMAL LOW (ref 11.7–15.5)
Lymphs Abs: 1729 cells/uL (ref 850–3900)
MCH: 23.4 pg — ABNORMAL LOW (ref 27.0–33.0)
MCHC: 31.8 g/dL — ABNORMAL LOW (ref 32.0–36.0)
MCV: 73.6 fL — ABNORMAL LOW (ref 80.0–100.0)
MPV: 10.4 fL (ref 7.5–12.5)
Monocytes Relative: 6.5 %
Neutro Abs: 4697 cells/uL (ref 1500–7800)
Neutrophils Relative %: 67.1 %
Platelets: 395 10*3/uL (ref 140–400)
RBC: 4.44 10*6/uL (ref 3.80–5.10)
RDW: 15.3 % — ABNORMAL HIGH (ref 11.0–15.0)
Total Lymphocyte: 24.7 %
WBC mixed population: 455 cells/uL (ref 200–950)
WBC: 7 10*3/uL (ref 3.8–10.8)

## 2017-10-24 NOTE — Addendum Note (Signed)
Addended by: Verdie ShireBAYNES,  M on: 10/24/2017 10:43 AM   Modules accepted: Orders

## 2017-10-27 LAB — PATHOLOGIST SMEAR REVIEW

## 2017-10-29 ENCOUNTER — Other Ambulatory Visit: Payer: Self-pay

## 2017-11-05 ENCOUNTER — Encounter: Payer: Self-pay | Admitting: Family Medicine

## 2017-11-05 ENCOUNTER — Ambulatory Visit: Payer: BLUE CROSS/BLUE SHIELD | Admitting: Family Medicine

## 2017-11-05 ENCOUNTER — Ambulatory Visit (INDEPENDENT_AMBULATORY_CARE_PROVIDER_SITE_OTHER): Payer: BLUE CROSS/BLUE SHIELD | Admitting: Family Medicine

## 2017-11-05 VITALS — BP 101/65 | HR 85 | Temp 97.9°F | Wt 180.4 lb

## 2017-11-05 DIAGNOSIS — M25551 Pain in right hip: Secondary | ICD-10-CM

## 2017-11-05 DIAGNOSIS — M542 Cervicalgia: Secondary | ICD-10-CM

## 2017-11-05 DIAGNOSIS — M9909 Segmental and somatic dysfunction of abdomen and other regions: Secondary | ICD-10-CM | POA: Diagnosis not present

## 2017-11-05 DIAGNOSIS — M9908 Segmental and somatic dysfunction of rib cage: Secondary | ICD-10-CM | POA: Diagnosis not present

## 2017-11-05 DIAGNOSIS — M9903 Segmental and somatic dysfunction of lumbar region: Secondary | ICD-10-CM | POA: Diagnosis not present

## 2017-11-05 DIAGNOSIS — M9904 Segmental and somatic dysfunction of sacral region: Secondary | ICD-10-CM

## 2017-11-05 DIAGNOSIS — M9902 Segmental and somatic dysfunction of thoracic region: Secondary | ICD-10-CM | POA: Diagnosis not present

## 2017-11-05 DIAGNOSIS — M9901 Segmental and somatic dysfunction of cervical region: Secondary | ICD-10-CM | POA: Diagnosis not present

## 2017-11-05 DIAGNOSIS — M9905 Segmental and somatic dysfunction of pelvic region: Secondary | ICD-10-CM | POA: Diagnosis not present

## 2017-11-05 DIAGNOSIS — M25552 Pain in left hip: Secondary | ICD-10-CM

## 2017-11-05 NOTE — Progress Notes (Signed)
BP 101/65 (BP Location: Left Arm, Patient Position: Sitting, Cuff Size: Normal)   Pulse 85   Temp 97.9 F (36.6 C) (Oral)   Wt 180 lb 6.4 oz (81.8 kg)   SpO2 99%   BMI 31.96 kg/m    Subjective:    Patient ID: Karla Huber, female    DOB: February 07, 1986, 32 y.o.   MRN: [id number]  HPI: Karla Huber is a 32 y.o. female  Chief Complaint  Patient presents with  . Neck Pain  . OMM   [name] comes in today for evaluation of her neck. She notes that the front of her legs and her neck have been really tight for the past several days. She notes that she did well following her last appointment, but has been increasing her exercise recently. It's helping her feel good, but she's feeling tight. She notes that her hips and neck are tight. This gets better with stretching and medicine. Its worse with stress and certain positions. Pain in her hips has been going on a couple of days. Pain in her neck is more chronic. Has been better in general, but has been slightly exacerbated recently. Pain occasionally radiates into her upper back and head. She is otherwise doing well with no other concerns or complaints at this time.    Relevant past medical, surgical, family and social history reviewed and updated as indicated. Interim medical history since our last visit reviewed. Allergies and medications reviewed and updated.  Review of Systems  Constitutional: Negative.   Respiratory: Negative.   Cardiovascular: Negative.   Musculoskeletal: Positive for myalgias, neck pain and neck stiffness. Negative for arthralgias, back pain, gait problem and joint swelling.  Skin: Negative.   Neurological: Negative.   Psychiatric/Behavioral: Negative.     Per HPI unless specifically indicated above     Objective:    BP 101/65 (BP Location: Left Arm, Patient Position: Sitting, Cuff Size: Normal)   Pulse 85   Temp 97.9 F (36.6 C) (Oral)   Wt 180 lb 6.4 oz (81.8 kg)   SpO2 99%   BMI 31.96 kg/m   Wt  Readings from Last 3 Encounters:  11/05/17 180 lb 6.4 oz (81.8 kg)  10/03/17 182 lb 8 oz (82.8 kg)  09/03/17 180 lb 3 oz (81.7 kg)    Physical Exam  Constitutional: She is oriented to person, place, and time. She appears well-developed and well-nourished. No distress.  HENT:  Head: Normocephalic and atraumatic.  Right Ear: Hearing normal.  Left Ear: Hearing normal.  Nose: Nose normal.  Eyes: Conjunctivae and lids are normal. Right eye exhibits no discharge. Left eye exhibits no discharge. No scleral icterus.  Pulmonary/Chest: Effort normal. No respiratory distress.  Abdominal: Soft. She exhibits no distension and no mass. There is no tenderness. There is no rebound and no guarding.  Neurological: She is alert and oriented to person, place, and time.  Skin: Skin is warm, dry and intact. No rash noted. She is not diaphoretic. No erythema. No pallor.  Psychiatric: She has a normal mood and affect. Her speech is normal and behavior is normal. Judgment and thought content normal. Cognition and memory are normal.  Musculoskeletal:  Exam found Decreased ROM, Tissue texture changes, Tenderness to palpation and Asymmetry of patient's  neck, thorax, ribs, lumbar, pelvis, sacrum and abdomen Osteopathic Structural Exam:   Neck: C4ESRR, C6ESRL, SCM hypertonic on the R  Thorax: T5-6SLRR, trap spasm on the R  Ribs: Ribs 6-9 locked up on the R, Ribs 9-10 locked  up on the L  Lumbar: Psoas spasm bilaterally R>L  Pelvis: Posterior R innominate  Sacrum: L on L torsion  Abdomen: Pelvic diaphragm restricted with fascial strain into R innominate.    Results for orders placed or performed in visit on 10/24/17  Pathologist smear review  Result Value Ref Range   Path Review    CBC with Differential/Platelet  Result Value Ref Range   WBC 7.0 3.8 - 10.8 Thousand/uL   RBC 4.44 3.80 - 5.10 Million/uL   Hemoglobin 10.4 (L) 11.7 - 15.5 g/dL   HCT 16.132.7 (L) 09.635.0 - 04.545.0 %   MCV 73.6 (L) 80.0 - 100.0 fL   MCH  23.4 (L) 27.0 - 33.0 pg   MCHC 31.8 (L) 32.0 - 36.0 g/dL   RDW 40.915.3 (H) 81.111.0 - 91.415.0 %   Platelets 395 140 - 400 Thousand/uL   MPV 10.4 7.5 - 12.5 fL   Neutro Abs 4,697 1,500 - 7,800 cells/uL   Lymphs Abs 1,729 850 - 3,900 cells/uL   WBC mixed population 455 200 - 950 cells/uL   Eosinophils Absolute 98 15 - 500 cells/uL   Basophils Absolute 21 0 - 200 cells/uL   Neutrophils Relative % 67.1 %   Total Lymphocyte 24.7 %   Monocytes Relative 6.5 %   Eosinophils Relative 1.4 %   Basophils Relative 0.3 %      Assessment & Plan:   Problem List Items Addressed This Visit    None    Visit Diagnoses    Neck pain    -  Primary   Chronic. Better with OMT. Seems to be myofacial in nature. I think she would benefit from OMT, treated today as below.    Pain of both hip joints       Psoas tight today- likely exacerbated from increased exercise. Would benefit from OMT. Treated today as below with good results.    Thoracic segment dysfunction       Somatic dysfunction of sacral region       Somatic dysfunction of rib region       Cervical (neck) region somatic dysfunction       Somatic dysfunction of pelvis region       Segmental dysfunction of abdomen       Somatic dysfunction of lumbar region         After verbal consent was obtained, patient was treated today with osteopathic manipulative medicine to the regions of the neck, thorax, ribs, lumbar, pelvis, sacrum and abdomen using the techniques of myofascial release, counterstrain, muscle energy, HVLA and soft tissue. Areas of compensation relating to her primary pain source also treated. Patient tolerated the procedure well with good objective and good subjective improvement in symptoms. She left the room in good condition. She was advised to stay well hydrated and that she may have some soreness following the procedure. If not improving or worsening, she will call and come in. Home exercise program of stretches for lumbar discussed and  demonstrated today. Patient will do these stretches BID to before the point of pain, and will return for reevaluation  on a PRN basis.   Follow up plan: Return 4-6 weeks, for OMT eval (30 min procedure visit).

## 2017-11-07 ENCOUNTER — Other Ambulatory Visit (INDEPENDENT_AMBULATORY_CARE_PROVIDER_SITE_OTHER): Payer: BLUE CROSS/BLUE SHIELD

## 2017-11-07 DIAGNOSIS — I509 Heart failure, unspecified: Secondary | ICD-10-CM | POA: Diagnosis not present

## 2017-11-07 DIAGNOSIS — Z1211 Encounter for screening for malignant neoplasm of colon: Secondary | ICD-10-CM | POA: Diagnosis not present

## 2017-11-07 LAB — FECAL OCCULT BLOOD, IMMUNOCHEMICAL: Fecal Occult Bld: NEGATIVE

## 2017-11-14 ENCOUNTER — Encounter: Payer: Self-pay | Admitting: Family Medicine

## 2017-11-14 ENCOUNTER — Other Ambulatory Visit: Payer: Self-pay | Admitting: Family Medicine

## 2017-11-14 DIAGNOSIS — D509 Iron deficiency anemia, unspecified: Secondary | ICD-10-CM

## 2017-11-19 ENCOUNTER — Telehealth: Payer: No Typology Code available for payment source | Admitting: Family

## 2017-11-19 DIAGNOSIS — K130 Diseases of lips: Secondary | ICD-10-CM

## 2017-11-19 DIAGNOSIS — B370 Candidal stomatitis: Secondary | ICD-10-CM

## 2017-11-19 MED ORDER — MUPIROCIN 2 % EX OINT: 1.0000 "application " | TOPICAL_OINTMENT | Freq: Two times a day (BID) | CUTANEOUS | 0 refills | Status: DC

## 2017-11-19 MED ORDER — NYSTATIN 100000 UNIT/ML MT SUSP: 5.0000 mL | Freq: Four times a day (QID) | OROMUCOSAL | 0 refills | Status: DC

## 2017-11-19 NOTE — Progress Notes (Signed)
E Visit for Rash  We are sorry that you are not feeling well. Here is how we plan to help!  Based upon your presentation it appears you have a fungal infection.  I have prescribed: Nystatin solution four times a day.   I will send in mupirocin topical ointment that you can apply around the mouth four times a day as needed  for your angular cheilitis.   HOME CARE:   Take cool showers and avoid direct sunlight.  Apply cool compress or wet dressings.  Take a bath in an oatmeal bath.  Sprinkle content of one Aveeno packet under running faucet with comfortably warm water.  Bathe for 15-20 minutes, 1-2 times daily.  Pat dry with a towel. Do not rub the rash.  Use hydrocortisone cream.  Take an antihistamine like Benadryl for widespread rashes that itch.  The adult dose of Benadryl is 25-50 mg by mouth 4 times daily.  Caution:  This type of medication may cause sleepiness.  Do not drink alcohol, drive, or operate dangerous machinery while taking antihistamines.  Do not take these medications if you have prostate enlargement.  Read package instructions thoroughly on all medications that you take.  GET HELP RIGHT AWAY IF:   Symptoms don't go away after treatment.  Severe itching that persists.  If you rash spreads or swells.  If you rash begins to smell.  If it blisters and opens or develops a yellow-brown crust.  You develop a fever.  You have a sore throat.  You become short of breath.  MAKE SURE YOU:  Understand these instructions. Will watch your condition. Will get help right away if you are not doing well or get worse.  Thank you for choosing an e-visit. Your e-visit answers were reviewed by a board certified advanced clinical practitioner to complete your personal care plan. Depending upon the condition, your plan could have included both over the counter or prescription medications. Please review your pharmacy choice. Be sure that the pharmacy you have chosen is open  so that you can pick up your prescription now.  If there is a problem you may message your provider in MyChart to have the prescription routed to another pharmacy. Your safety is important to us. If you have drug allergies check your prescription carefully.  For the next 24 hours, you can use MyChart to ask questions about today's visit, request a non-urgent call back, or ask for a work or school excuse from your e-visit provider. You will get an email in the next two days asking about your experience. I hope that your e-visit has been valuable and will speed your recovery.

## 2017-11-21 MED ORDER — FLUCONAZOLE 10 MG/ML PO SUSR: 50.0000 mg | Freq: Two times a day (BID) | ORAL | 0 refills | Status: AC

## 2017-11-21 NOTE — Addendum Note (Signed)
Addended by: Beau FannyWITHROW,  C on: 11/21/2017 10:29 AM   Modules accepted: Orders

## 2017-12-02 ENCOUNTER — Telehealth: Payer: Self-pay | Admitting: Family Medicine

## 2017-12-02 NOTE — Telephone Encounter (Unsigned)
Copied from CRM #[id number]. Topic: Bill or Statement - Patient/Guarantor Inquiry °>> Nov 27, 2017  1:29 PM Morris, Sharamare E, NT wrote: °Patient name/MRN/Acct #: Karla Huber, 10/29/1985, [id number] °DOS: 11/07/17 °Details of issue or inquiry: Patient called because she was seen in the office for blood work and the doctor submitted the bill with a Wellness Code instead of diagnostic code  and insurance won't be pay for it. Pt would like a call back. ° °Route to appropriate [facility] or [facility] Coding pool. ° °

## 2017-12-02 NOTE — Telephone Encounter (Signed)
Copied from CRM [phone]. Topic: Bill or Statement - Patient/Guarantor Inquiry >> Nov 27, 2017  1:29 PM Karla Huber, Karla Huber, NT wrote: Patient name/MRN/Acct #: Karla Huber, October 17, 1986, [id number] DOS: 11/07/17 Details of issue or inquiry: Patient called because she was seen in the office for blood work and the doctor submitted the bill with a Wellness Code instead of diagnostic code  and insurance won't be pay for it. Pt would like a call back.  Route to appropriate [facility] or [facility] Coding pool.

## 2017-12-02 NOTE — Telephone Encounter (Signed)
Spoke with patient, Sent message to coders to review for accuracy, will reach out to patient once I hear back from the coding review.

## 2017-12-03 NOTE — Telephone Encounter (Signed)
Per coding review has been sent to Charge correction to have DX updated and refilled with insurance. LM making patient aware to allow 30 days for insurance to re-process her claim

## 2017-12-05 ENCOUNTER — Other Ambulatory Visit: Payer: Self-pay | Admitting: Family

## 2017-12-05 DIAGNOSIS — O99019 Anemia complicating pregnancy, unspecified trimester: Principal | ICD-10-CM

## 2017-12-05 DIAGNOSIS — D509 Iron deficiency anemia, unspecified: Secondary | ICD-10-CM

## 2017-12-05 NOTE — Telephone Encounter (Unsigned)
Copied from CRM [phone]. Topic: Bill or Statement - Patient/Guarantor Inquiry >> Dec 01, 2017  3:41 PM Arlyss Gandyichardson, Taren N, NT wrote: Patient name/MRN/Acct #: Karla Huber,Karla Huber   [id number]  [birth year] DOS: 11/07/17 Details of issue or inquiry: Pts visit was coded as a Wellness Screening but should have been coded as diagnostic. Pts insurance denied covering this for pt due to wrong code. Please correct and resubmitted. [facility]  Route to appropriate [facility] or [facility] Coding pool.  >> Dec 01, 2017  4:11 PM Vanessa RalphsCorbett, Maria G wrote: Please review patient  Call and coding.  Advise if coding intent was diagnostic or screening.  Thanks  Byrd HesselbachMaria >> Dec 01, 2017  4:12 PM Vanessa RalphsCorbett, Maria G wrote: Route to office >> Dec 04, 2017 12:23 PM Peace, Tammy L wrote: Will follow up with patient in regard.

## 2017-12-08 ENCOUNTER — Encounter: Payer: Self-pay | Admitting: Family

## 2017-12-08 ENCOUNTER — Other Ambulatory Visit: Payer: Self-pay

## 2017-12-08 ENCOUNTER — Inpatient Hospital Stay: Payer: BLUE CROSS/BLUE SHIELD

## 2017-12-08 ENCOUNTER — Other Ambulatory Visit: Payer: Self-pay | Admitting: Family

## 2017-12-08 ENCOUNTER — Inpatient Hospital Stay: Payer: BLUE CROSS/BLUE SHIELD | Attending: Family | Admitting: Family

## 2017-12-08 VITALS — BP 117/60 | HR 93 | Temp 98.4°F | Resp 16 | Wt 185.4 lb

## 2017-12-08 DIAGNOSIS — O99019 Anemia complicating pregnancy, unspecified trimester: Principal | ICD-10-CM

## 2017-12-08 DIAGNOSIS — K909 Intestinal malabsorption, unspecified: Secondary | ICD-10-CM | POA: Insufficient documentation

## 2017-12-08 DIAGNOSIS — I10 Essential (primary) hypertension: Secondary | ICD-10-CM

## 2017-12-08 DIAGNOSIS — D509 Iron deficiency anemia, unspecified: Secondary | ICD-10-CM

## 2017-12-08 DIAGNOSIS — N921 Excessive and frequent menstruation with irregular cycle: Secondary | ICD-10-CM

## 2017-12-08 HISTORY — DX: Intestinal malabsorption, unspecified: K90.9

## 2017-12-08 HISTORY — DX: Excessive and frequent menstruation with irregular cycle: N92.1

## 2017-12-08 LAB — CMP (CANCER CENTER ONLY)
ALT: 18 U/L (ref 0–55)
AST: 20 U/L (ref 5–34)
Albumin: 3.9 g/dL (ref 3.5–5.0)
Alkaline Phosphatase: 76 U/L (ref 26–84)
Anion gap: 7 (ref 5–15)
BUN: 7 mg/dL (ref 7–22)
CO2: 26 mmol/L (ref 18–33)
Calcium: 9 mg/dL (ref 8.0–10.3)
Chloride: 107 mmol/L (ref 98–108)
Creatinine: 0.8 mg/dL (ref 0.60–1.10)
Glucose, Bld: 127 mg/dL — ABNORMAL HIGH (ref 73–118)
Potassium: 3.3 mmol/L (ref 3.3–4.7)
Sodium: 140 mmol/L (ref 128–145)
Total Bilirubin: 0.5 mg/dL (ref 0.2–1.2)
Total Protein: 7.8 g/dL (ref 6.4–8.1)

## 2017-12-08 LAB — CBC WITH DIFFERENTIAL (CANCER CENTER ONLY)
Basophils Absolute: 0 10*3/uL (ref 0.0–0.1)
Basophils Relative: 0 %
Eosinophils Absolute: 0.2 10*3/uL (ref 0.0–0.5)
Eosinophils Relative: 2 %
HCT: 34 % — ABNORMAL LOW (ref 34.8–46.6)
Hemoglobin: 10.5 g/dL — ABNORMAL LOW (ref 11.6–15.9)
Lymphocytes Relative: 22 %
Lymphs Abs: 1.8 10*3/uL (ref 0.9–3.3)
MCH: 23.9 pg — ABNORMAL LOW (ref 26.0–34.0)
MCHC: 30.9 g/dL — ABNORMAL LOW (ref 32.0–36.0)
MCV: 77.3 fL — ABNORMAL LOW (ref 81.0–101.0)
Monocytes Absolute: 0.4 10*3/uL (ref 0.1–0.9)
Monocytes Relative: 5 %
Neutro Abs: 5.8 10*3/uL (ref 1.5–6.5)
Neutrophils Relative %: 71 %
Platelet Count: 413 10*3/uL — ABNORMAL HIGH (ref 145–400)
RBC: 4.4 MIL/uL (ref 3.70–5.32)
RDW: 16.3 % — ABNORMAL HIGH (ref 11.1–15.7)
WBC Count: 8.2 10*3/uL (ref 3.9–10.0)

## 2017-12-08 LAB — RETICULOCYTES
RBC.: 4.43 MIL/uL (ref 3.70–5.45)
Retic Count, Absolute: 62 10*3/uL (ref 33.7–90.7)
Retic Ct Pct: 1.4 % (ref 0.7–2.1)

## 2017-12-08 LAB — IRON AND TIBC
Iron: 26 ug/dL — ABNORMAL LOW (ref 41–142)
Saturation Ratios: 5 % — ABNORMAL LOW (ref 21–57)
TIBC: 485 ug/dL — ABNORMAL HIGH (ref 236–444)
UIBC: 459 ug/dL

## 2017-12-08 LAB — FERRITIN: Ferritin: 6 ng/mL — ABNORMAL LOW (ref 9–269)

## 2017-12-08 LAB — SAVE SMEAR

## 2017-12-08 NOTE — Progress Notes (Signed)
Hematology/Oncology Consultation   Name: Amoy Steeves      MRN: [id number]    Location: Room/bed info not found  Date: 12/08/2017 Time:1:03 PM   REFERRING PHYSICIAN: Seabron Spates, DO  REASON FOR CONSULT: Iron deficiency anemia    DIAGNOSIS:  Iron deficiency anemia   HISTORY OF PRESENT ILLNESS: Ms. Selvage is a very pleasant 32 yo caucasian female with iron deficiency anemia since her first pregnancy 3 years ago. She has tried taking oral iron with no improvement in the past.  Hgb today is 10.5 with an MCV of 77 and platelet count of 413.  She is symptomatic with fatigue and some weakness after exercising. Her cycle is regular and not heavy. She has had no other bleeding, no bruising or petechiae.  She will have a menstrual headache with her cycle.  She has 2 children. Her 45 year old is iron deficient and is on an oral supplement.  She had 2 C-sections.  She had a cousin pass away at 82 yo with thalassemia anemia and AML. No other family history of anemia.  No fever, chills, n/v, cough, rash, dizziness, SOB, chest pain, palpitations, abdominal pain or changes in bowel or bladder habits.  She has puffiness in her hands and feet that comes and goes. This started when she had pre-eclampsia with her first pregnancy.  No tenderness, numbness or tingling in her extremities. No c/o pain.  She has maintained a good appetite and is staying well hydrated. Her weight is stable.  She has never smoked and only occasionally has an alcoholic beverage socially.  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   ROS: All other 10 point review of systems is negative.   PAST MEDICAL HISTORY:   Past Medical History:  Diagnosis Date  . Acute blood loss anemia 11/13/2016  . Asthma    pulmocort daily  . GERD (gastroesophageal reflux disease)   . Gestational diabetes    diet controlled  . Headache   . Hx of varicella   . Hypertension    pre-eclampsia  . Iron deficiency anemia of  pregnancy 11/13/2016  . Postpartum care following cesarean delivery (4/30) 02/25/2015  . Postpartum care following repeat cesarean delivery (1/16) 11/13/2016  . Preeclampsia   . Traumatic injury during pregnancy in third trimester 02/09/2015    ALLERGIES: Allergies  Allergen Reactions  . Amoxicillin Hives    Has patient had a PCN reaction causing immediate rash, facial/tongue/throat swelling, SOB or lightheadedness with hypotension: {no Has patient had a PCN reaction causing severe rash involving mucus membranes or skin necrosis: {no Has patient had a PCN reaction that required hospitalization no Has patient had a PCN reaction occurring within the last 10 years: {yes If all of the above answers are "NO", then may proceed with Cephalosporin use.  Marland Kitchen Hydrocodone Hives    Has tolerated Percocet in the past  . [place]   . Tdap [Diphth-Acell Pertussis-Tetanus] Other (See Comments)    Fever, Joint stiffness      MEDICATIONS:  Current Outpatient Medications on File Prior to Visit  Medication Sig Dispense Refill  . albuterol (PROVENTIL HFA;VENTOLIN HFA) 108 (90 Base) MCG/ACT inhaler Use 2 puffs every four hours as needed for cough or wheeze.  May use 2 puffs 10-20 minutes prior to exercise. 1 Inhaler 1  . budesonide (PULMICORT FLEXHALER) 180 MCG/ACT inhaler Use 2 puffs twice daily to prevent cough or wheeze.  Rinse, gargle, and spit after use. 3 each  3  . cetirizine (ZYRTEC) 10 MG tablet Take 10 mg by mouth daily. Used in Spring & Fall, for seasonal allergies.    . mupirocin ointment (BACTROBAN) 2 % Apply 1 application topically 2 (two) times daily. 22 g 0  . omeprazole (PRILOSEC OTC) 20 MG tablet Take 20 mg by mouth daily.    . Prenatal Vit-Fe Fumarate-FA (PRENATAL VITAMIN PO) Take by mouth.     No current facility-administered medications on file prior to visit.      PAST SURGICAL HISTORY Past Surgical History:  Procedure Laterality Date  . ADENOIDECTOMY    . CESAREAN SECTION N/A 02/25/2015    Procedure: CESAREAN SECTION;  Surgeon: Shea EvansVaishali Mody, MD;  Location: [facility];  Service: Obstetrics;  Laterality: N/A;  . CESAREAN SECTION N/A 11/12/2016   Procedure: Repeat CESAREAN SECTION;  Surgeon: Olivia Mackieichard Taavon, MD;  [facility];  Service: Obstetrics;  Laterality: N/A;  . FOOT SURGERY    . KNEE SURGERY    . TONSILLECTOMY      FAMILY HISTORY: Family History  Problem Relation Age of Onset  . Heart disease Mother   . Mitral valve prolapse Father   . Asthma Sister   . Hypertension Maternal Grandmother   . Cancer Cousin        AML    SOCIAL HISTORY:  reports that  has never smoked. she has never used smokeless tobacco. She reports that she does not drink alcohol or use drugs.  PERFORMANCE STATUS: The patient's performance status is 1 - Symptomatic but completely ambulatory  PHYSICAL EXAM: Most Recent Vital Signs: Blood pressure 117/60, pulse 93, temperature 98.4 F (36.9 C), temperature source Oral, resp. rate 16, weight 185 lb 6.4 oz (84.1 kg), SpO2 100 %, unknown if currently breastfeeding. BP 117/60 (BP Location: Right Arm, Patient Position: Sitting)   Pulse 93   Temp 98.4 F (36.9 C) (Oral)   Resp 16   Wt 185 lb 6.4 oz (84.1 kg)   SpO2 100%   BMI 32.84 kg/m   General Appearance:    Alert, cooperative, no distress, appears stated age  Head:    Normocephalic, without obvious abnormality, atraumatic  Eyes:    PERRL, conjunctiva/corneas clear, EOM's intact, fundi    benign, both eyes        Throat:   Lips, mucosa, and tongue normal; teeth and gums normal  Neck:   Supple, symmetrical, trachea midline, no adenopathy;    thyroid:  no enlargement/tenderness/nodules; no carotid   bruit or JVD  Back:     Symmetric, no curvature, ROM normal, no CVA tenderness  Lungs:     Clear to auscultation bilaterally, respirations unlabored  Chest Wall:    No tenderness or deformity   Heart:    Regular rate and rhythm, S1 and S2 normal, no murmur, rub   or gallop      Abdomen:     Soft, non-tender, bowel sounds active all four quadrants,    no masses, no organomegaly        Extremities:   Extremities normal, atraumatic, no cyanosis or edema  Pulses:   2+ and symmetric all extremities  Skin:   Skin color, texture, turgor normal, no rashes or lesions  Lymph nodes:   Cervical, supraclavicular, and axillary nodes normal  Neurologic:   CNII-XII intact, normal strength, sensation and reflexes    throughout    LABORATORY DATA:  Results for orders placed or performed in visit on 12/08/17 (from the past 48 hour(s))  CBC  with Differential (Cancer Center Only)     Status: Abnormal   Collection Time: 12/08/17 10:03 AM  Result Value Ref Range   WBC Count 8.2 3.9 - 10.0 K/uL   RBC 4.40 3.70 - 5.32 MIL/uL   Hemoglobin 10.5 (L) 11.6 - 15.9 g/dL   HCT 16.1 (L) 09.6 - 04.5 %   MCV 77.3 (L) 81.0 - 101.0 fL   MCH 23.9 (L) 26.0 - 34.0 pg   MCHC 30.9 (L) 32.0 - 36.0 g/dL   RDW 40.9 (H) 81.1 - 91.4 %   Platelet Count 413 (H) 145 - 400 K/uL   Neutrophils Relative % 71 %   Neutro Abs 5.8 1.5 - 6.5 K/uL   Lymphocytes Relative 22 %   Lymphs Abs 1.8 0.9 - 3.3 K/uL   Monocytes Relative 5 %   Monocytes Absolute 0.4 0.1 - 0.9 K/uL   Eosinophils Relative 2 %   Eosinophils Absolute 0.2 0.0 - 0.5 K/uL   Basophils Relative 0 %   Basophils Absolute 0.0 0.0 - 0.1 K/uL    Comment: Performed at [facility] at [facility], [address], [place], [place] [address]  CMP (Cancer Center only)     Status: Abnormal   Collection Time: 12/08/17 10:03 AM  Result Value Ref Range   Sodium 140 128 - 145 mmol/L   Potassium 3.3 3.3 - 4.7 mmol/L   Chloride 107 98 - 108 mmol/L   CO2 26 18 - 33 mmol/L   Glucose, Bld 127 (H) 73 - 118 mg/dL   BUN 7 7 - 22 mg/dL   Creatinine 6.21 3.08 - 1.10 mg/dL   Calcium 9.0 8.0 - 65.7 mg/dL   Total Protein 7.8 6.4 - 8.1 g/dL   Albumin 3.9 3.5 - 5.0 g/dL   AST 20 5 - 34 U/L   ALT 18 0 - 55 U/L   Alkaline Phosphatase 76  26 - 84 U/L   Total Bilirubin 0.5 0.2 - 1.2 mg/dL   Anion gap 7 5 - 15    Comment: Performed at [facility] at [facility], [address], [place], [place] [address]      RADIOGRAPHY: No results found.     PATHOLOGY: None  ASSESSMENT/PLAN: Ms. Horsford is a very pleasant 32 yo caucasian female with iron deficiency anemia since her first pregnancy 3 years ago. Hgb is 10.5 with an MCV of 77 and platelet count of 413.  She is symptomatic with fatigue and occasional weakness.  We will see what her iron studies show and bring her back in for infusion if needed.  We will plan to see her back in another 6 weeks for follow-up.   All questions were answered. She will contact our office with any questions or concerns. We can certainly see her sooner if necessary.  She was discussed with and also seen by Dr. Myna Hidalgo and he is in agreement with the aforementioned.   Emeline Gins    Addendum: I saw and examined the patient with Maralyn Sago.  I agree with her above assessment.  Clearly, her iron studies are incredibly low.  Her ferritin is only 6 with an iron saturation of 5%.  I think that the etiology is multifactorial.  I suspect that she lost iron with her pregnancies and with her C-sections.  She has her monthly cycles which I suspect she loses iron with.  She also takes Prilosec.  Because of this, she really does  not absorb iron.  She is on prenatal vitamins.  I do not think this really is helping her.  I looked at her blood smear.  She has microcytic red blood cells.  She has some anisocytosis.  I saw no immature myeloid cells.  She had no nucleated red blood cells.  Platelets were slightly increased in number.  We will set her up with 2 doses of IV iron.  I think she would benefit from IV iron.  We spent about 45 minutes with her.  Over 50% of the time was spent face-to-face.  I went over her lab work.  I went over my recommendations.  I answered her  questions.  She is in agreement to have the IV iron.  We will try to get this set up for either this week or next.  We will get her back to see [place] in 6 weeks.  Christin Bach, MD

## 2017-12-11 ENCOUNTER — Other Ambulatory Visit: Payer: Self-pay | Admitting: Family

## 2017-12-11 ENCOUNTER — Telehealth: Payer: BLUE CROSS/BLUE SHIELD | Admitting: Family

## 2017-12-11 ENCOUNTER — Inpatient Hospital Stay: Payer: BLUE CROSS/BLUE SHIELD

## 2017-12-11 VITALS — BP 102/59 | HR 66 | Temp 98.5°F | Resp 18

## 2017-12-11 DIAGNOSIS — I10 Essential (primary) hypertension: Secondary | ICD-10-CM | POA: Diagnosis not present

## 2017-12-11 DIAGNOSIS — N921 Excessive and frequent menstruation with irregular cycle: Secondary | ICD-10-CM

## 2017-12-11 DIAGNOSIS — D509 Iron deficiency anemia, unspecified: Secondary | ICD-10-CM | POA: Diagnosis not present

## 2017-12-11 DIAGNOSIS — D62 Acute posthemorrhagic anemia: Secondary | ICD-10-CM

## 2017-12-11 DIAGNOSIS — B9689 Other specified bacterial agents as the cause of diseases classified elsewhere: Secondary | ICD-10-CM

## 2017-12-11 DIAGNOSIS — K909 Intestinal malabsorption, unspecified: Secondary | ICD-10-CM

## 2017-12-11 DIAGNOSIS — J329 Chronic sinusitis, unspecified: Secondary | ICD-10-CM

## 2017-12-11 MED ORDER — SODIUM CHLORIDE 0.9 % IV SOLN
750.0000 mg | Freq: Once | INTRAVENOUS | Status: AC
  Administered 2017-12-11: 750 mg via INTRAVENOUS
  Filled 2017-12-11: qty 15

## 2017-12-11 MED ORDER — FAMOTIDINE IN NACL 20-0.9 MG/50ML-% IV SOLN
INTRAVENOUS | Status: AC
  Filled 2017-12-11: qty 100

## 2017-12-11 MED ORDER — DOXYCYCLINE HYCLATE 100 MG PO TABS: 100.0000 mg | ORAL_TABLET | Freq: Two times a day (BID) | ORAL | 0 refills | Status: DC

## 2017-12-11 MED ORDER — DIPHENHYDRAMINE HCL 25 MG PO CAPS
ORAL_CAPSULE | ORAL | Status: AC
  Filled 2017-12-11: qty 1

## 2017-12-11 MED ORDER — FAMOTIDINE IN NACL 20-0.9 MG/50ML-% IV SOLN
40.0000 mg | Freq: Once | INTRAVENOUS | Status: AC
  Administered 2017-12-11: 40 mg via INTRAVENOUS

## 2017-12-11 MED ORDER — DIPHENHYDRAMINE HCL 25 MG PO CAPS
25.0000 mg | ORAL_CAPSULE | Freq: Once | ORAL | Status: AC
  Administered 2017-12-11: 25 mg via ORAL

## 2017-12-11 MED ORDER — FLUCONAZOLE 150 MG PO TABS: 150.0000 mg | ORAL_TABLET | Freq: Once | ORAL | 0 refills | Status: AC

## 2017-12-11 NOTE — Progress Notes (Signed)
Thank you for the details you included in the comment boxes. Those details are very helpful in determining the best course of treatment for you and help us to provide the best care. Diflucan for yeast infection and doxycycline are both safe in breastfeeding. See below.   We are sorry that you are not feeling well.  Here is how we plan to help!  Based on what you have shared with me it looks like you have sinusitis.  Sinusitis is inflammation and infection in the sinus cavities of the head.  Based on your presentation I believe you most likely have Acute Bacterial Sinusitis.  This is an infection caused by bacteria and is treated with antibiotics. I have prescribed Doxycycline 100mg  by mouth twice a day for 7 days. You may use an oral decongestant such as Mucinex D or if you have glaucoma or high blood pressure use plain Mucinex. Saline nasal spray help and can safely be used as often as needed for congestion.  If you develop worsening sinus pain, fever or notice severe headache and vision changes, or if symptoms are not better after completion of antibiotic, please schedule an appointment with a health care provider.  I also sent the Diflucan as you requested.   Sinus infections are not as easily transmitted as other respiratory infection, however we still recommend that you avoid close contact with loved ones, especially the very young and elderly.  Remember to wash your hands thoroughly throughout the day as this is the number one way to prevent the spread of infection!  Home Care:  Only take medications as instructed by your medical team.  Complete the entire course of an antibiotic.  Do not take these medications with alcohol.  A steam or ultrasonic humidifier can help congestion.  You can place a towel over your head and breathe in the steam from hot water coming from a faucet.  Avoid close contacts especially the very young and the elderly.  Cover your mouth when you cough or  sneeze.  Always remember to wash your hands.  Get Help Right Away If:  You develop worsening fever or sinus pain.  You develop a severe head ache or visual changes.  Your symptoms persist after you have completed your treatment plan.  Make sure you  Understand these instructions.  Will watch your condition.  Will get help right away if you are not doing well or get worse.  Your e-visit answers were reviewed by a board certified advanced clinical practitioner to complete your personal care plan.  Depending on the condition, your plan could have included both over the counter or prescription medications.  If there is a problem please reply  once you have received a response from your provider.  Your safety is important to us.  If you have drug allergies check your prescription carefully.    You can use MyChart to ask questions about today's visit, request a non-urgent call back, or ask for a work or school excuse for 24 hours related to this e-Visit. If it has been greater than 24 hours you will need to follow up with your provider, or enter a new e-Visit to address those concerns.  You will get an e-mail in the next two days asking about your experience.  I hope that your e-visit has been valuable and will speed your recovery. Thank you for using e-visits.

## 2017-12-11 NOTE — Patient Instructions (Signed)
Ferric carboxymaltose injection What is this medicine? FERRIC CARBOXYMALTOSE (ferr-ik car-box-ee-mol-toes) is an iron complex. Iron is used to make healthy red blood cells, which carry oxygen and nutrients throughout the body. This medicine is used to treat anemia in people with chronic kidney disease or people who cannot take iron by mouth. This medicine may be used for other purposes; ask your health care provider or pharmacist if you have questions. COMMON BRAND NAME(S): Injectafer What should I tell my health care provider before I take this medicine? They need to know if you have any of these conditions: -anemia not caused by low iron levels -high levels of iron in the blood -liver disease -an unusual or allergic reaction to iron, other medicines, foods, dyes, or preservatives -pregnant or trying to get pregnant -breast-feeding How should I use this medicine? This medicine is for infusion into a vein. It is given by a health care professional in a hospital or clinic setting. Talk to your pediatrician regarding the use of this medicine in children. Special care may be needed. Overdosage: If you think you have taken too much of this medicine contact a poison control center or emergency room at once. NOTE: This medicine is only for you. Do not share this medicine with others. What if I miss a dose? It is important not to miss your dose. Call your doctor or health care professional if you are unable to keep an appointment. What may interact with this medicine? Do not take this medicine with any of the following medications: -deferoxamine -dimercaprol -other iron products This medicine may also interact with the following medications: -chloramphenicol -deferasirox This list may not describe all possible interactions. Give your health care provider a list of all the medicines, herbs, non-prescription drugs, or dietary supplements you use. Also tell them if you smoke, drink alcohol, or use  illegal drugs. Some items may interact with your medicine. What should I watch for while using this medicine? Visit your doctor or health care professional regularly. Tell your doctor if your symptoms do not start to get better or if they get worse. You may need blood work done while you are taking this medicine. You may need to follow a special diet. Talk to your doctor. Foods that contain iron include: whole grains/cereals, dried fruits, beans, or peas, leafy green vegetables, and organ meats (liver, kidney). What side effects may I notice from receiving this medicine? Side effects that you should report to your doctor or health care professional as soon as possible: -allergic reactions like skin rash, itching or hives, swelling of the face, lips, or tongue -breathing problems -changes in blood pressure -feeling faint or lightheaded, falls -flushing, sweating, or hot feelings Side effects that usually do not require medical attention (report to your doctor or health care professional if they continue or are bothersome): -changes in taste -constipation -dizziness -headache -nausea -pain, redness, or irritation at site where injected -vomiting This list may not describe all possible side effects. Call your doctor for medical advice about side effects. You may report side effects to FDA at 1-800-FDA-1088. Where should I keep my medicine? This drug is given in a hospital or clinic and will not be stored at home. NOTE: This sheet is a summary. It may not cover all possible information. If you have questions about this medicine, talk to your doctor, pharmacist, or health care provider.  2018 Elsevier/Gold Standard (2015-11-16 11:20:47)  

## 2017-12-17 ENCOUNTER — Ambulatory Visit: Payer: BLUE CROSS/BLUE SHIELD | Admitting: Family Medicine

## 2017-12-18 ENCOUNTER — Inpatient Hospital Stay: Payer: BLUE CROSS/BLUE SHIELD

## 2017-12-18 VITALS — BP 107/78 | HR 68 | Resp 18

## 2017-12-18 DIAGNOSIS — N921 Excessive and frequent menstruation with irregular cycle: Secondary | ICD-10-CM

## 2017-12-18 DIAGNOSIS — K909 Intestinal malabsorption, unspecified: Secondary | ICD-10-CM

## 2017-12-18 DIAGNOSIS — I10 Essential (primary) hypertension: Secondary | ICD-10-CM | POA: Diagnosis not present

## 2017-12-18 DIAGNOSIS — D509 Iron deficiency anemia, unspecified: Secondary | ICD-10-CM | POA: Diagnosis not present

## 2017-12-18 DIAGNOSIS — D62 Acute posthemorrhagic anemia: Secondary | ICD-10-CM

## 2017-12-18 MED ORDER — DIPHENHYDRAMINE HCL 25 MG PO CAPS
ORAL_CAPSULE | ORAL | Status: AC
  Filled 2017-12-18: qty 1

## 2017-12-18 MED ORDER — FAMOTIDINE IN NACL 20-0.9 MG/50ML-% IV SOLN
40.0000 mg | Freq: Once | INTRAVENOUS | Status: AC
  Administered 2017-12-18: 40 mg via INTRAVENOUS

## 2017-12-18 MED ORDER — FAMOTIDINE IN NACL 20-0.9 MG/50ML-% IV SOLN
INTRAVENOUS | Status: AC
  Filled 2017-12-18: qty 50

## 2017-12-18 MED ORDER — DIPHENHYDRAMINE HCL 25 MG PO CAPS
25.0000 mg | ORAL_CAPSULE | Freq: Once | ORAL | Status: AC
  Administered 2017-12-18: 25 mg via ORAL

## 2017-12-18 MED ORDER — SODIUM CHLORIDE 0.9 % IV SOLN
750.0000 mg | Freq: Once | INTRAVENOUS | Status: AC
  Administered 2017-12-18: 750 mg via INTRAVENOUS
  Filled 2017-12-18: qty 15

## 2017-12-18 NOTE — Patient Instructions (Signed)
Ferric carboxymaltose injection What is this medicine? FERRIC CARBOXYMALTOSE (ferr-ik car-box-ee-mol-toes) is an iron complex. Iron is used to make healthy red blood cells, which carry oxygen and nutrients throughout the body. This medicine is used to treat anemia in people with chronic kidney disease or people who cannot take iron by mouth. This medicine may be used for other purposes; ask your health care provider or pharmacist if you have questions. COMMON BRAND NAME(S): Injectafer What should I tell my health care provider before I take this medicine? They need to know if you have any of these conditions: -anemia not caused by low iron levels -high levels of iron in the blood -liver disease -an unusual or allergic reaction to iron, other medicines, foods, dyes, or preservatives -pregnant or trying to get pregnant -breast-feeding How should I use this medicine? This medicine is for infusion into a vein. It is given by a health care professional in a hospital or clinic setting. Talk to your pediatrician regarding the use of this medicine in children. Special care may be needed. Overdosage: If you think you have taken too much of this medicine contact a poison control center or emergency room at once. NOTE: This medicine is only for you. Do not share this medicine with others. What if I miss a dose? It is important not to miss your dose. Call your doctor or health care professional if you are unable to keep an appointment. What may interact with this medicine? Do not take this medicine with any of the following medications: -deferoxamine -dimercaprol -other iron products This medicine may also interact with the following medications: -chloramphenicol -deferasirox This list may not describe all possible interactions. Give your health care provider a list of all the medicines, herbs, non-prescription drugs, or dietary supplements you use. Also tell them if you smoke, drink alcohol, or use  illegal drugs. Some items may interact with your medicine. What should I watch for while using this medicine? Visit your doctor or health care professional regularly. Tell your doctor if your symptoms do not start to get better or if they get worse. You may need blood work done while you are taking this medicine. You may need to follow a special diet. Talk to your doctor. Foods that contain iron include: whole grains/cereals, dried fruits, beans, or peas, leafy green vegetables, and organ meats (liver, kidney). What side effects may I notice from receiving this medicine? Side effects that you should report to your doctor or health care professional as soon as possible: -allergic reactions like skin rash, itching or hives, swelling of the face, lips, or tongue -breathing problems -changes in blood pressure -feeling faint or lightheaded, falls -flushing, sweating, or hot feelings Side effects that usually do not require medical attention (report to your doctor or health care professional if they continue or are bothersome): -changes in taste -constipation -dizziness -headache -nausea -pain, redness, or irritation at site where injected -vomiting This list may not describe all possible side effects. Call your doctor for medical advice about side effects. You may report side effects to FDA at 1-800-FDA-1088. Where should I keep my medicine? This drug is given in a hospital or clinic and will not be stored at home. NOTE: This sheet is a summary. It may not cover all possible information. If you have questions about this medicine, talk to your doctor, pharmacist, or health care provider.  2018 Elsevier/Gold Standard (2015-11-16 11:20:47)  

## 2017-12-26 LAB — HM PAP SMEAR

## 2017-12-31 DIAGNOSIS — Z01419 Encounter for gynecological examination (general) (routine) without abnormal findings: Secondary | ICD-10-CM | POA: Diagnosis not present

## 2017-12-31 DIAGNOSIS — Z6833 Body mass index (BMI) 33.0-33.9, adult: Secondary | ICD-10-CM | POA: Diagnosis not present

## 2017-12-31 DIAGNOSIS — Z1151 Encounter for screening for human papillomavirus (HPV): Secondary | ICD-10-CM | POA: Diagnosis not present

## 2018-01-06 ENCOUNTER — Encounter: Payer: Self-pay | Admitting: Family Medicine

## 2018-01-06 ENCOUNTER — Ambulatory Visit (INDEPENDENT_AMBULATORY_CARE_PROVIDER_SITE_OTHER): Payer: BLUE CROSS/BLUE SHIELD | Admitting: Family Medicine

## 2018-01-06 VITALS — BP 115/76 | HR 85 | Temp 98.4°F | Wt 185.2 lb

## 2018-01-06 DIAGNOSIS — M9904 Segmental and somatic dysfunction of sacral region: Secondary | ICD-10-CM | POA: Diagnosis not present

## 2018-01-06 DIAGNOSIS — M9905 Segmental and somatic dysfunction of pelvic region: Secondary | ICD-10-CM | POA: Diagnosis not present

## 2018-01-06 DIAGNOSIS — M542 Cervicalgia: Secondary | ICD-10-CM | POA: Diagnosis not present

## 2018-01-06 DIAGNOSIS — M9902 Segmental and somatic dysfunction of thoracic region: Secondary | ICD-10-CM

## 2018-01-06 DIAGNOSIS — M9908 Segmental and somatic dysfunction of rib cage: Secondary | ICD-10-CM

## 2018-01-06 DIAGNOSIS — M9903 Segmental and somatic dysfunction of lumbar region: Secondary | ICD-10-CM

## 2018-01-06 DIAGNOSIS — M9909 Segmental and somatic dysfunction of abdomen and other regions: Secondary | ICD-10-CM

## 2018-01-06 DIAGNOSIS — M9900 Segmental and somatic dysfunction of head region: Secondary | ICD-10-CM

## 2018-01-06 DIAGNOSIS — M9901 Segmental and somatic dysfunction of cervical region: Secondary | ICD-10-CM | POA: Diagnosis not present

## 2018-01-06 NOTE — Progress Notes (Signed)
BP 115/76   Pulse 85   Temp 98.4 F (36.9 C) (Oral)   Wt 185 lb 3.2 oz (84 kg)   SpO2 99%   BMI 32.81 kg/m    Subjective:    Patient ID: Karla Huber, female    DOB: [birth year], 32 y.o.   MRN: [id number]  HPI: Karla Huber is a 32 y.o. female  Chief Complaint  Patient presents with  . Neck Pain    Maram comes in today for evaluation and possible treatment with OMT for neck pain. She notes that she has not been feeling well. has been having a lot of neck pain and headaches. She was diagnosed with a pretty severe iron deficiency and has been getting iron infusions. She states that it did not make her feel well and she got very tight shortly thereafter. Has been having a lot of pain in her neck. She notes that it has been tight and aching in nature. Located on both sides of her neck, radiating into her L shoulder as well. Pain has been worse over the past 2-3 weeks. It's about the same. Craniocradle and heat make it better, stress seems to make it worse. She notes that she has been using the craniocraidle and it helps, but only for a short period of time. She also notes that she stopped pumping so L breast has been hurting into shoulder and into the L side of her neck. She is otherwise feeling well with no other concerns or complaints at this time.   Relevant past medical, surgical, family and social history reviewed and updated as indicated. Interim medical history since our last visit reviewed. Allergies and medications reviewed and updated.  Review of Systems  Constitutional: Negative.   Respiratory: Negative.   Cardiovascular: Negative.   Musculoskeletal: Positive for myalgias, neck pain and neck stiffness. Negative for arthralgias, back pain, gait problem and joint swelling.  Skin: Negative.   Neurological: Positive for headaches. Negative for dizziness, tremors, seizures, syncope, facial asymmetry, speech difficulty, weakness, light-headedness and numbness.    Psychiatric/Behavioral: Negative.     Per HPI unless specifically indicated above     Objective:    BP 115/76   Pulse 85   Temp 98.4 F (36.9 C) (Oral)   Wt 185 lb 3.2 oz (84 kg)   SpO2 99%   BMI 32.81 kg/m   Wt Readings from Last 3 Encounters:  01/06/18 185 lb 3.2 oz (84 kg)  12/08/17 185 lb 6.4 oz (84.1 kg)  11/05/17 180 lb 6.4 oz (81.8 kg)    Physical Exam  Constitutional: She is oriented to person, place, and time. She appears well-developed and well-nourished. No distress.  HENT:  Head: Normocephalic and atraumatic.  Right Ear: Hearing normal.  Left Ear: Hearing normal.  Nose: Nose normal.  Eyes: Conjunctivae and lids are normal. Right eye exhibits no discharge. Left eye exhibits no discharge. No scleral icterus.  Cardiovascular: Normal rate, regular rhythm, normal heart sounds and intact distal pulses. Exam reveals no gallop and no friction rub.  No murmur heard. Pulmonary/Chest: Effort normal and breath sounds normal. No respiratory distress. She has no wheezes. She has no rales. She exhibits no tenderness.  Neurological: She is alert and oriented to person, place, and time.  Skin: Skin is warm, dry and intact. No rash noted. She is not diaphoretic. No erythema. No pallor.  Psychiatric: She has a normal mood and affect. Her speech is normal and behavior is normal. Judgment and thought content normal. Cognition  and memory are normal.  Nursing note and vitals reviewed. Musculoskeletal:  Exam found Decreased ROM, Tissue texture changes, Tenderness to palpation and Asymmetry of patient's  head, neck, thorax, ribs, lumbar, pelvis, sacrum and abdomen Osteopathic Structural Exam:   Head: hypertonic suboccipital muscles, OAESSR, OM suture restricted on the R, R Lateral strain   Neck: C3ESRR, SCM hypertonic on the R  Thorax: T3-5SLRR  Ribs: Ribs 5-7 locked up on the R, Ribs 6-9 locked up on the L  Lumbar: QL hypertonic on the R, L4-5 SRRL  Pelvis: Anterior R  pelvis  Sacrum: R on R torsion  Abdomen: diaphragm spasm R>L  Results for orders placed or performed in visit on 12/08/17  CBC with Differential (Cancer Center Only)  Result Value Ref Range   WBC Count 8.2 3.9 - 10.0 K/uL   RBC 4.40 3.70 - 5.32 MIL/uL   Hemoglobin 10.5 (L) 11.6 - 15.9 g/dL   HCT 16.1 (L) 09.6 - 04.5 %   MCV 77.3 (L) 81.0 - 101.0 fL   MCH 23.9 (L) 26.0 - 34.0 pg   MCHC 30.9 (L) 32.0 - 36.0 g/dL   RDW 40.9 (H) 81.1 - 91.4 %   Platelet Count 413 (H) 145 - 400 K/uL   Neutrophils Relative % 71 %   Neutro Abs 5.8 1.5 - 6.5 K/uL   Lymphocytes Relative 22 %   Lymphs Abs 1.8 0.9 - 3.3 K/uL   Monocytes Relative 5 %   Monocytes Absolute 0.4 0.1 - 0.9 K/uL   Eosinophils Relative 2 %   Eosinophils Absolute 0.2 0.0 - 0.5 K/uL   Basophils Relative 0 %   Basophils Absolute 0.0 0.0 - 0.1 K/uL  CMP (Cancer Center only)  Result Value Ref Range   Sodium 140 128 - 145 mmol/L   Potassium 3.3 3.3 - 4.7 mmol/L   Chloride 107 98 - 108 mmol/L   CO2 26 18 - 33 mmol/L   Glucose, Bld 127 (H) 73 - 118 mg/dL   BUN 7 7 - 22 mg/dL   Creatinine 7.82 9.56 - 1.10 mg/dL   Calcium 9.0 8.0 - 21.3 mg/dL   Total Protein 7.8 6.4 - 8.1 g/dL   Albumin 3.9 3.5 - 5.0 g/dL   AST 20 5 - 34 U/L   ALT 18 0 - 55 U/L   Alkaline Phosphatase 76 26 - 84 U/L   Total Bilirubin 0.5 0.2 - 1.2 mg/dL   Anion gap 7 5 - 15  Save smear  Result Value Ref Range   Smear Review SMEAR STAINED AND AVAILABLE FOR REVIEW   Ferritin  Result Value Ref Range   Ferritin 6 (L) 9 - 269 ng/mL  Iron and TIBC  Result Value Ref Range   Iron 26 (L) 41 - 142 ug/dL   TIBC 086 (H) 578 - 469 ug/dL   Saturation Ratios 5 (L) 21 - 57 %   UIBC 459 ug/dL  Reticulocytes  Result Value Ref Range   Retic Ct Pct 1.4 0.7 - 2.1 %   RBC. 4.43 3.70 - 5.45 MIL/uL   Retic Count, Absolute 62.0 33.7 - 90.7 K/uL      Assessment & Plan:   Problem List Items Addressed This Visit    None    Visit Diagnoses    Neck pain    -  Primary   In  acute exacerbation. Seems to be due to tension and possibly from iron infusion. She does have somatic dysfunction contributing. Treated today as below.  Thoracic segment dysfunction       Somatic dysfunction of sacral region       Somatic dysfunction of rib region       Cervical (neck) region somatic dysfunction       Somatic dysfunction of pelvis region       Segmental dysfunction of abdomen       Head region somatic dysfunction       Somatic dysfunction of lumbar region         After verbal consent was obtained, patient was treated today with osteopathic manipulative medicine to the regions of the head, neck, thorax, ribs, lumbar, pelvis, sacrum and abdomen using the techniques of cranial, myofascial release, counterstrain and soft tissue. Areas of compensation relating to her primary pain source also treated. Patient tolerated the procedure well with good objective and good subjective improvement in symptoms. She left the room in good condition. She was advised to stay well hydrated and that she may have some soreness following the procedure. If not improving or worsening, she will call and come in. She will return for reevaluation  In 3-4 weeks.   Follow up plan: Return if symptoms worsen or fail to improve.

## 2018-01-21 ENCOUNTER — Inpatient Hospital Stay: Payer: BLUE CROSS/BLUE SHIELD

## 2018-01-21 ENCOUNTER — Inpatient Hospital Stay: Payer: BLUE CROSS/BLUE SHIELD | Attending: Family | Admitting: Family

## 2018-01-21 ENCOUNTER — Other Ambulatory Visit: Payer: Self-pay

## 2018-01-21 VITALS — BP 117/60 | HR 79 | Temp 98.6°F | Resp 18 | Wt 187.5 lb

## 2018-01-21 DIAGNOSIS — D509 Iron deficiency anemia, unspecified: Secondary | ICD-10-CM | POA: Diagnosis not present

## 2018-01-21 DIAGNOSIS — N920 Excessive and frequent menstruation with regular cycle: Secondary | ICD-10-CM | POA: Diagnosis not present

## 2018-01-21 DIAGNOSIS — D500 Iron deficiency anemia secondary to blood loss (chronic): Secondary | ICD-10-CM | POA: Diagnosis not present

## 2018-01-21 DIAGNOSIS — N921 Excessive and frequent menstruation with irregular cycle: Secondary | ICD-10-CM

## 2018-01-21 DIAGNOSIS — O99019 Anemia complicating pregnancy, unspecified trimester: Secondary | ICD-10-CM

## 2018-01-21 LAB — CBC WITH DIFFERENTIAL (CANCER CENTER ONLY)
Basophils Absolute: 0 10*3/uL (ref 0.0–0.1)
Basophils Relative: 0 %
Eosinophils Absolute: 0.2 10*3/uL (ref 0.0–0.5)
Eosinophils Relative: 2 %
HCT: 41.1 % (ref 34.8–46.6)
Hemoglobin: 13.8 g/dL (ref 11.6–15.9)
Lymphocytes Relative: 23 %
Lymphs Abs: 1.9 10*3/uL (ref 0.9–3.3)
MCH: 29.5 pg (ref 26.0–34.0)
MCHC: 33.6 g/dL (ref 32.0–36.0)
MCV: 87.8 fL (ref 81.0–101.0)
Monocytes Absolute: 0.4 10*3/uL (ref 0.1–0.9)
Monocytes Relative: 4 %
Neutro Abs: 6.1 10*3/uL (ref 1.5–6.5)
Neutrophils Relative %: 71 %
Platelet Count: 322 10*3/uL (ref 145–400)
RBC: 4.68 MIL/uL (ref 3.70–5.32)
RDW: 23.3 % — ABNORMAL HIGH (ref 11.1–15.7)
WBC Count: 8.5 10*3/uL (ref 3.9–10.0)

## 2018-01-21 LAB — IRON AND TIBC
Iron: 80 ug/dL (ref 41–142)
Saturation Ratios: 23 % (ref 21–57)
TIBC: 356 ug/dL (ref 236–444)
UIBC: 276 ug/dL

## 2018-01-21 LAB — FERRITIN: Ferritin: 117 ng/mL (ref 9–269)

## 2018-01-21 NOTE — Progress Notes (Signed)
Hematology and Oncology Follow Up Visit  Juanetta BeetsRebekah Larocca [id number] Sep 22, 1986 32 y.o. 01/21/2018   Principle Diagnosis:  Iron deficiency anemia   Current Therapy:   IV iron (Injectafer) as indicated - last received in February 2019 x 2   Interim History:  Ms. [name] is here today for follow-up. She is doing well and her energy seems to be better since her iron infusions in February. Her baby boy is teething and keeping her up at night causing some mild fatigue.  The angular cheilitis at the corners of her mouth has resolved.  Her cycle earlier this month was quite heavy. She started an oral birth control 2 weeks ago on 01/06/18.  No other bleeding, no bruising or petechiae. No lymphadenopathy found on exam.  No fever, chills, n/v, cough, rash, dizziness, SOB, chest pain, palpitations, abdominal pain or changes in bowel or bladder habits.  No swelling, tenderness, numbness or tingling in her extremities. No c/o pain.  She is working out daily at home.  She has maintained a good appetite and is staying hydrated. Her weight is stable.   ECOG Performance Status: 1 - Symptomatic but completely ambulatory  Medications:  Allergies as of 01/21/2018      Reactions   Amoxicillin Hives   Has patient had a PCN reaction causing immediate rash, facial/tongue/throat swelling, SOB or lightheadedness with hypotension: {no Has patient had a PCN reaction causing severe rash involving mucus membranes or skin necrosis: {no Has patient had a PCN reaction that required hospitalization no Has patient had a PCN reaction occurring within the last 10 years: {yes If all of the above answers are "NO", then may proceed with Cephalosporin use.   Hydrocodone Hives   Has tolerated Percocet in the past   [place]    Tdap [tetanus-diphth-acell Pertussis] Other (See Comments)   Fever, Joint stiffness      Medication List        Accurate as of 01/21/18  9:03 AM. Always use your most recent med list.            albuterol 108 (90 Base) MCG/ACT inhaler Commonly known as:  PROVENTIL HFA;VENTOLIN HFA Use 2 puffs every four hours as needed for cough or wheeze.  May use 2 puffs 10-20 minutes prior to exercise.   budesonide 180 MCG/ACT inhaler Commonly known as:  PULMICORT FLEXHALER Use 2 puffs twice daily to prevent cough or wheeze.  Rinse, gargle, and spit after use.   cetirizine 10 MG tablet Commonly known as:  ZYRTEC Take 10 mg by mouth daily. Used in Spring & Fall, for seasonal allergies.   Levonorgestrel-Ethinyl Estradiol 0.1-0.02 & 0.01 MG tablet Commonly known as:  AMETHIA,CAMRESE Take 1 tablet by mouth daily.   mupirocin ointment 2 % Commonly known as:  BACTROBAN Apply 1 application topically 2 (two) times daily.   omeprazole 20 MG tablet Commonly known as:  PRILOSEC OTC Take 20 mg by mouth daily.   PRENATAL VITAMIN PO Take by mouth.       Allergies:  Allergies  Allergen Reactions  . Amoxicillin Hives    Has patient had a PCN reaction causing immediate rash, facial/tongue/throat swelling, SOB or lightheadedness with hypotension: {no Has patient had a PCN reaction causing severe rash involving mucus membranes or skin necrosis: {no Has patient had a PCN reaction that required hospitalization no Has patient had a PCN reaction occurring within the last 10 years: {yes If all of the above answers are "NO", then may proceed with Cephalosporin use.  .Marland Kitchen  Hydrocodone Hives    Has tolerated Percocet in the past  . [place]   . Tdap [Tetanus-Diphth-Acell Pertussis] Other (See Comments)    Fever, Joint stiffness    Past Medical History, Surgical history, Social history, and Family History were reviewed and updated.  Review of Systems: All other 10 point review of systems is negative.   Physical Exam:  vitals were not taken for this visit.   Wt Readings from Last 3 Encounters:  01/06/18 185 lb 3.2 oz (84 kg)  12/08/17 185 lb 6.4 oz (84.1 kg)  11/05/17 180 lb 6.4 oz (81.8 kg)     Ocular: Sclerae unicteric, pupils equal, round and reactive to light Ear-nose-throat: Oropharynx clear, dentition fair Lymphatic: No cervical, supraclavicular or axillary adenopathy Lungs no rales or rhonchi, good excursion bilaterally Heart regular rate and rhythm, no murmur appreciated Abd soft, nontender, positive bowel sounds, no liver or spleen tip palpated on exam, no fluid wave  MSK no focal spinal tenderness, no joint edema Neuro: non-focal, well-oriented, appropriate affect Breasts: Deferred   Lab Results  Component Value Date   WBC 8.5 01/21/2018   HGB 10.4 (L) 10/24/2017   HCT 41.1 01/21/2018   MCV 87.8 01/21/2018   PLT 322 01/21/2018   Lab Results  Component Value Date   FERRITIN 6 (L) 12/08/2017   IRON 26 (L) 12/08/2017   TIBC 485 (H) 12/08/2017   UIBC 459 12/08/2017   IRONPCTSAT 5 (L) 12/08/2017   Lab Results  Component Value Date   RETICCTPCT 1.4 12/08/2017   RBC 4.68 01/21/2018   No results found for: KPAFRELGTCHN, LAMBDASER, KAPLAMBRATIO No results found for: IGGSERUM, IGA, IGMSERUM No results found for: Dorene Ar, A1GS, A2GS, Colin Benton, MSPIKE, SPEI   Chemistry      Component Value Date/Time   NA 140 12/08/2017 1003   K 3.3 12/08/2017 1003   CL 107 12/08/2017 1003   CO2 26 12/08/2017 1003   BUN 7 12/08/2017 1003   CREATININE 0.80 12/08/2017 1003      Component Value Date/Time   CALCIUM 9.0 12/08/2017 1003   ALKPHOS 76 12/08/2017 1003   AST 20 12/08/2017 1003   ALT 18 12/08/2017 1003   BILITOT 0.5 12/08/2017 1003      Impression and Plan: Ms. Nored is a very pleasant 32 yo caucasian female with iron deficiency anemia secondary to heavy cycles. She responded nicely to the 2 doses of IV iron she received in February. Hgb is now 13.8 and her symptoms have improved quite a bit.  Her cycle was heavy earlier this month and she is still having some mild fatigue.  She states that she started a birth control that will  stop her from having a cycle.  We will see what her iron studies show and bring her back in for infusion if needed.  We will plan to see her back in another 3 months for follow-up.  She will contact our office with any questions of concerns. We can certainly see her sooner if need be.   Emeline Gins, NP 3/27/20199:03 AM

## 2018-01-27 ENCOUNTER — Ambulatory Visit: Payer: BLUE CROSS/BLUE SHIELD | Admitting: Family Medicine

## 2018-01-28 ENCOUNTER — Encounter: Payer: Self-pay | Admitting: Family Medicine

## 2018-01-28 ENCOUNTER — Ambulatory Visit (INDEPENDENT_AMBULATORY_CARE_PROVIDER_SITE_OTHER): Payer: BLUE CROSS/BLUE SHIELD | Admitting: Family Medicine

## 2018-01-28 VITALS — BP 122/75 | HR 89 | Temp 99.1°F | Wt 187.6 lb

## 2018-01-28 DIAGNOSIS — M9905 Segmental and somatic dysfunction of pelvic region: Secondary | ICD-10-CM

## 2018-01-28 DIAGNOSIS — M9908 Segmental and somatic dysfunction of rib cage: Secondary | ICD-10-CM

## 2018-01-28 DIAGNOSIS — M9901 Segmental and somatic dysfunction of cervical region: Secondary | ICD-10-CM | POA: Diagnosis not present

## 2018-01-28 DIAGNOSIS — M9900 Segmental and somatic dysfunction of head region: Secondary | ICD-10-CM | POA: Diagnosis not present

## 2018-01-28 DIAGNOSIS — M9904 Segmental and somatic dysfunction of sacral region: Secondary | ICD-10-CM | POA: Diagnosis not present

## 2018-01-28 DIAGNOSIS — M542 Cervicalgia: Secondary | ICD-10-CM

## 2018-01-28 DIAGNOSIS — M9902 Segmental and somatic dysfunction of thoracic region: Secondary | ICD-10-CM | POA: Diagnosis not present

## 2018-01-31 ENCOUNTER — Encounter: Payer: Self-pay | Admitting: Family Medicine

## 2018-01-31 NOTE — Progress Notes (Signed)
BP 122/75 (BP Location: Left Arm, Patient Position: Sitting, Cuff Size: Normal)   Pulse 89   Temp 99.1 F (37.3 C)   Wt 187 lb 9 oz (85.1 kg)   SpO2 100%   BMI 33.23 kg/m    Subjective:    Patient ID: Karla Huber, female    DOB: 24-Feb-1986, 32 y.o.   MRN: [id number]  HPI: Karla Huber is a 32 y.o. female  Chief Complaint  Patient presents with  . Neck Pain   Kioni presents today for evaluation and possible treatment with OMT. She notes that she did really well following her last treatment and is feeling much more like herself. She has not had another iron infusion since her last appointment, but does note that although the infusion made her very tight previously, she is feeling better in general and is less tired. She continues with some baseline upper neck pain. This is aching and tight in nature and located on both sides of her neck and radiating occasionally into her shoulder. Worse with certain movements and better with stretching, exercise and using her craniocradle. She notes that she has otherwise been doing well with no other concerns or complaints at this time.   Relevant past medical, surgical, family and social history reviewed and updated as indicated. Interim medical history since our last visit reviewed. Allergies and medications reviewed and updated.  Review of Systems  Constitutional: Negative.   Respiratory: Negative.   Cardiovascular: Negative.   Musculoskeletal: Positive for back pain, myalgias, neck pain and neck stiffness. Negative for arthralgias, gait problem and joint swelling.  Skin: Negative.   Neurological: Negative.   Psychiatric/Behavioral: Negative.     Per HPI unless specifically indicated above     Objective:    BP 122/75 (BP Location: Left Arm, Patient Position: Sitting, Cuff Size: Normal)   Pulse 89   Temp 99.1 F (37.3 C)   Wt 187 lb 9 oz (85.1 kg)   SpO2 100%   BMI 33.23 kg/m   Wt Readings from Last 3 Encounters:    01/28/18 187 lb 9 oz (85.1 kg)  01/21/18 187 lb 8 oz (85 kg)  01/06/18 185 lb 3.2 oz (84 kg)    Physical Exam  Constitutional: She is oriented to person, place, and time. She appears well-developed and well-nourished. No distress.  HENT:  Head: Normocephalic and atraumatic.  Right Ear: Hearing normal.  Left Ear: Hearing normal.  Nose: Nose normal.  Eyes: Conjunctivae and lids are normal. Right eye exhibits no discharge. Left eye exhibits no discharge. No scleral icterus.  Pulmonary/Chest: Effort normal. No respiratory distress.  Abdominal: Soft. She exhibits no distension and no mass. There is no tenderness. There is no rebound and no guarding.  Neurological: She is alert and oriented to person, place, and time.  Skin: Skin is warm, dry and intact. No rash noted. She is not diaphoretic. No erythema. No pallor.  Psychiatric: She has a normal mood and affect. Her speech is normal and behavior is normal. Judgment and thought content normal. Cognition and memory are normal.  Nursing note and vitals reviewed. Musculoskeletal:  Exam found Decreased ROM, Tissue texture changes, Tenderness to palpation and Asymmetry of patient's  head, neck, thorax, ribs, pelvis and sacrum Osteopathic Structural Exam:   Head: hypertonic suboccipital muscles, OAESSR, OM suture restricted on the R, temporal anterior and inferior, lateral pterygoid and masseter hypertonic on the R  Neck: C3ESRR, C4ESRL, C2 rotated L  Thorax: T3-5SRRL  Ribs: RIb 4-5 locked up  on the R  Pelvis: anterior R innominate  Sacrum: R on R torsion  Results for orders placed or performed in visit on 01/21/18  Ferritin  Result Value Ref Range   Ferritin 117 9 - 269 ng/mL  Iron and TIBC  Result Value Ref Range   Iron 80 41 - 142 ug/dL   TIBC 161356 096236 - 045444 ug/dL   Saturation Ratios 23 21 - 57 %   UIBC 276 ug/dL  CBC with Differential (Cancer Center Only)  Result Value Ref Range   WBC Count 8.5 3.9 - 10.0 K/uL   RBC 4.68 3.70 - 5.32  MIL/uL   Hemoglobin 13.8 11.6 - 15.9 g/dL   HCT 40.941.1 81.134.8 - 91.446.6 %   MCV 87.8 81.0 - 101.0 fL   MCH 29.5 26.0 - 34.0 pg   MCHC 33.6 32.0 - 36.0 g/dL   RDW 78.223.3 (H) 95.611.1 - 21.315.7 %   Platelet Count 322 145 - 400 K/uL   Neutrophils Relative % 71 %   Neutro Abs 6.1 1.5 - 6.5 K/uL   Lymphocytes Relative 23 %   Lymphs Abs 1.9 0.9 - 3.3 K/uL   Monocytes Relative 4 %   Monocytes Absolute 0.4 0.1 - 0.9 K/uL   Eosinophils Relative 2 %   Eosinophils Absolute 0.2 0.0 - 0.5 K/uL   Basophils Relative 0 %   Basophils Absolute 0.0 0.0 - 0.1 K/uL      Assessment & Plan:   Problem List Items Addressed This Visit    None    Visit Diagnoses    Neck pain    -  Primary   Doing a bit better. Feeling more like herself. She does have some SD that is contributing to her symptoms. She was treated today with good results as below.   Thoracic segment dysfunction       Somatic dysfunction of sacral region       Somatic dysfunction of rib region       Cervical (neck) region somatic dysfunction       Somatic dysfunction of pelvis region       Head region somatic dysfunction         After verbal consent was obtained, patient was treated today with osteopathic manipulative medicine to the regions of the head, neck, thorax, ribs, pelvis and sacrum using the techniques of cranial, myofascial release, counterstrain, muscle energy, HVLA and soft tissue. Areas of compensation relating to her primary pain source also treated. Patient tolerated the procedure well with good objective and good subjective improvement in symptoms. She left the room in good condition. She was advised to stay well hydrated and that she may have some soreness following the procedure. If not improving or worsening, she will call and come in. She will return for reevaluation  in 1-2 months if needed.   Follow up plan: Return 1-2 months if needed, for Procedure visit .

## 2018-02-23 DIAGNOSIS — J029 Acute pharyngitis, unspecified: Secondary | ICD-10-CM | POA: Diagnosis not present

## 2018-02-23 DIAGNOSIS — B373 Candidiasis of vulva and vagina: Secondary | ICD-10-CM | POA: Diagnosis not present

## 2018-02-23 DIAGNOSIS — J020 Streptococcal pharyngitis: Secondary | ICD-10-CM | POA: Diagnosis not present

## 2018-02-25 ENCOUNTER — Ambulatory Visit (INDEPENDENT_AMBULATORY_CARE_PROVIDER_SITE_OTHER): Payer: BLUE CROSS/BLUE SHIELD | Admitting: Family Medicine

## 2018-02-25 ENCOUNTER — Encounter: Payer: Self-pay | Admitting: Family Medicine

## 2018-02-25 VITALS — BP 125/82 | HR 88 | Temp 98.3°F | Wt 184.2 lb

## 2018-02-25 DIAGNOSIS — M9909 Segmental and somatic dysfunction of abdomen and other regions: Secondary | ICD-10-CM

## 2018-02-25 DIAGNOSIS — M9903 Segmental and somatic dysfunction of lumbar region: Secondary | ICD-10-CM | POA: Diagnosis not present

## 2018-02-25 DIAGNOSIS — M9905 Segmental and somatic dysfunction of pelvic region: Secondary | ICD-10-CM

## 2018-02-25 DIAGNOSIS — M79604 Pain in right leg: Secondary | ICD-10-CM

## 2018-02-25 DIAGNOSIS — M9906 Segmental and somatic dysfunction of lower extremity: Secondary | ICD-10-CM | POA: Diagnosis not present

## 2018-02-25 DIAGNOSIS — M9904 Segmental and somatic dysfunction of sacral region: Secondary | ICD-10-CM | POA: Diagnosis not present

## 2018-02-25 MED ORDER — CYCLOBENZAPRINE HCL 5 MG PO TABS: 2.5000 mg | ORAL_TABLET | Freq: Every day | ORAL | 1 refills | Status: DC

## 2018-02-25 NOTE — Progress Notes (Signed)
BP 125/82 (BP Location: Left Arm, Patient Position: Sitting, Cuff Size: Normal)   Pulse 88   Temp 98.3 F (36.8 C)   Wt 184 lb 4 oz (83.6 kg)   SpO2 100%   BMI 32.64 kg/m    Subjective:    Patient ID: Karla Huber, female    DOB: [birth year], 32 y.o.   MRN: [id number]  HPI: Karla Huber is a 32 y.o. female  Chief Complaint  Patient presents with  . Leg Pain    right   Started with R leg pain about 3 weeks ago, but has been progressive lgetting worse in the R calf. Has not been noticing any swelling, no redness, no numbness. She notes that about 5 years ago fractured her foot. Has damage to the tendon in that leg since she had surgery on it. Since then, she notes that her foot hasn't moved quite right. However, since 3 weeks ago, her foot has been getting worse. She doesn't really remember what happened to make it hurt, but it doesn't seem to be getting any better.   LEG Pain Duration: 3 weeks Pain: yes Severity: moderate  Quality:  Sore feeling, pulling Location:  calves Bilateral:  no Onset: sudden Frequency: intermittent Time of  day:   constant, but waxes and wanes Sudden unintentional leg jerking:   no Paresthesias:   no Decreased sensation:  no Weakness:   yes Insomnia:   no Fatigue:   no Alleviating factors: nothing Aggravating factors: up and down the steps, after walking Status: worse Treatments attempted: motrin, magnesium spray, rolling   Relevant past medical, surgical, family and social history reviewed and updated as indicated. Interim medical history since our last visit reviewed. Allergies and medications reviewed and updated.  Review of Systems  Constitutional: Negative.   Respiratory: Negative.   Cardiovascular: Negative.   Musculoskeletal: Positive for arthralgias, gait problem and myalgias. Negative for back pain, joint swelling, neck pain and neck stiffness.  Skin: Negative.   Neurological: Negative for dizziness, tremors, seizures,  syncope, facial asymmetry, speech difficulty, weakness, light-headedness, numbness and headaches.  Psychiatric/Behavioral: Negative.     Per HPI unless specifically indicated above     Objective:    BP 125/82 (BP Location: Left Arm, Patient Position: Sitting, Cuff Size: Normal)   Pulse 88   Temp 98.3 F (36.8 C)   Wt 184 lb 4 oz (83.6 kg)   SpO2 100%   BMI 32.64 kg/m   Wt Readings from Last 3 Encounters:  02/25/18 184 lb 4 oz (83.6 kg)  01/28/18 187 lb 9 oz (85.1 kg)  01/21/18 187 lb 8 oz (85 kg)    Physical Exam  Constitutional: She is oriented to person, place, and time. She appears well-developed and well-nourished. No distress.  HENT:  Head: Normocephalic and atraumatic.  Right Ear: Hearing normal.  Left Ear: Hearing normal.  Nose: Nose normal.  Eyes: Conjunctivae and lids are normal. Right eye exhibits no discharge. Left eye exhibits no discharge. No scleral icterus.  Pulmonary/Chest: Effort normal. No respiratory distress.  Abdominal: Soft. She exhibits no distension and no mass. There is no tenderness. There is no rebound and no guarding. No hernia.  Neurological: She is alert and oriented to person, place, and time.  Skin: Skin is warm, dry and intact. Capillary refill takes less than 2 seconds. No rash noted. She is not diaphoretic. No erythema. No pallor.  Psychiatric: She has a normal mood and affect. Her speech is normal and behavior is normal. Judgment  and thought content normal. Cognition and memory are normal.  Musculoskeletal:  Exam found Decreased ROM, Tissue texture changes, Tenderness to palpation and Asymmetry of patient's  lumbar, pelvis, sacrum, lower extremity and abdomen Osteopathic Structural Exam:   Lumbar: QL and psoas hypertonic on the R  Pelvis: Anterior R innominate  Sacrum: R on R torsion  Lower Extremity: anterior R fibular head, decreased motion of foot and ankle, lateral tibial torsion, fascial strain from mid foot through tibia and into  knee  Abdomen: Pelvic diaphragm restricted and pulled into R hip   Results for orders placed or performed in visit on 01/21/18  Ferritin  Result Value Ref Range   Ferritin 117 9 - 269 ng/mL  Iron and TIBC  Result Value Ref Range   Iron 80 41 - 142 ug/dL   TIBC 811 914 - 782 ug/dL   Saturation Ratios 23 21 - 57 %   UIBC 276 ug/dL  CBC with Differential (Cancer Center Only)  Result Value Ref Range   WBC Count 8.5 3.9 - 10.0 K/uL   RBC 4.68 3.70 - 5.32 MIL/uL   Hemoglobin 13.8 11.6 - 15.9 g/dL   HCT 95.6 21.3 - 08.6 %   MCV 87.8 81.0 - 101.0 fL   MCH 29.5 26.0 - 34.0 pg   MCHC 33.6 32.0 - 36.0 g/dL   RDW 57.8 (H) 46.9 - 62.9 %   Platelet Count 322 145 - 400 K/uL   Neutrophils Relative % 71 %   Neutro Abs 6.1 1.5 - 6.5 K/uL   Lymphocytes Relative 23 %   Lymphs Abs 1.9 0.9 - 3.3 K/uL   Monocytes Relative 4 %   Monocytes Absolute 0.4 0.1 - 0.9 K/uL   Eosinophils Relative 2 %   Eosinophils Absolute 0.2 0.0 - 0.5 K/uL   Basophils Relative 0 %   Basophils Absolute 0.0 0.0 - 0.1 K/uL      Assessment & Plan:   Problem List Items Addressed This Visit    None    Visit Diagnoses    Right leg pain    -  Primary   Seeems to be primarily due to decreased motion in her foot. She has SD that is contributing to her symptoms. Treated today as below. Exercises given.    Somatic dysfunction of sacral region       Somatic dysfunction of pelvis region       Segmental dysfunction of abdomen       Somatic dysfunction of lumbar region       Somatic dysfunction of lower extremities         After verbal consent was obtained, patient was treated today with osteopathic manipulative medicine to the regions of the lumbar, pelvis, sacrum, abdomen and lower extremity using the techniques of myofascial release, counterstrain, muscle energy, HVLA and soft tissue. Areas of compensation relating to her primary pain source also treated. Patient tolerated the procedure well with good objective and good  subjective improvement in symptoms. She left the room in good condition. She was advised to stay well hydrated and that she may have some soreness following the procedure. If not improving or worsening, she will call and come in. Home exercise program of stretches for foot and ankle discussed and demonstrated today. Patient will do these stretches BID to before the point of pain, and will return for reevaluation  In 3-4 weeks.   Follow up plan: Return in about 1 month (around 03/25/2018) for Procedure visit.

## 2018-03-01 ENCOUNTER — Encounter: Payer: Self-pay | Admitting: Family Medicine

## 2018-03-11 ENCOUNTER — Encounter: Payer: Self-pay | Admitting: Family Medicine

## 2018-03-25 ENCOUNTER — Ambulatory Visit (INDEPENDENT_AMBULATORY_CARE_PROVIDER_SITE_OTHER): Payer: BLUE CROSS/BLUE SHIELD | Admitting: Family Medicine

## 2018-03-25 ENCOUNTER — Encounter: Payer: Self-pay | Admitting: Family Medicine

## 2018-03-25 ENCOUNTER — Ambulatory Visit (HOSPITAL_BASED_OUTPATIENT_CLINIC_OR_DEPARTMENT_OTHER)
Admission: RE | Admit: 2018-03-25 | Discharge: 2018-03-25 | Disposition: A | Discharge status: Home / Self Care | Payer: BLUE CROSS/BLUE SHIELD | Source: Ambulatory Visit | Attending: Family Medicine | Admitting: Family Medicine

## 2018-03-25 VITALS — BP 107/73 | HR 79 | Temp 97.8°F | Ht 63.0 in | Wt 183.7 lb

## 2018-03-25 DIAGNOSIS — M79604 Pain in right leg: Secondary | ICD-10-CM | POA: Insufficient documentation

## 2018-03-25 DIAGNOSIS — M7989 Other specified soft tissue disorders: Secondary | ICD-10-CM | POA: Insufficient documentation

## 2018-03-25 DIAGNOSIS — M9908 Segmental and somatic dysfunction of rib cage: Secondary | ICD-10-CM

## 2018-03-25 DIAGNOSIS — M9906 Segmental and somatic dysfunction of lower extremity: Secondary | ICD-10-CM | POA: Diagnosis not present

## 2018-03-25 DIAGNOSIS — M79671 Pain in right foot: Secondary | ICD-10-CM

## 2018-03-25 DIAGNOSIS — M9904 Segmental and somatic dysfunction of sacral region: Secondary | ICD-10-CM | POA: Diagnosis not present

## 2018-03-25 DIAGNOSIS — M9903 Segmental and somatic dysfunction of lumbar region: Secondary | ICD-10-CM

## 2018-03-25 DIAGNOSIS — M9905 Segmental and somatic dysfunction of pelvic region: Secondary | ICD-10-CM | POA: Diagnosis not present

## 2018-03-25 DIAGNOSIS — M79661 Pain in right lower leg: Secondary | ICD-10-CM | POA: Diagnosis not present

## 2018-03-25 NOTE — Progress Notes (Signed)
BP 107/73 (BP Location: Right Arm, Patient Position: Sitting, Cuff Size: Normal)   Pulse 79   Temp 97.8 F (36.6 C) (Oral)   Ht  (1.6 m)   Wt 183 lb 11.2 oz (83.3 kg)   SpO2 99%   BMI 32.54 kg/m    Subjective:    Patient ID: Karla Huber, female    DOB: 05-29-1986, 32 y.o.   MRN: [id number]  HPI: Karla Huber is a 32 y.o. female  Chief Complaint  Patient presents with  . R leg pain   Nakeeta presents today for follow up on her leg pain. She has been wearing good shoes, but hasn't really been helping. She is not sure where the pain is coming from. Has been having it randomly. It seems to come on with no set pattern. She can work out with no concerns, she can run around with her kids and it doesn't bother her, but then it will bother her when she is just walking or sitting. She is a bit concerned that it might be from her fracture or that she might have a blood clot given that her leg has been swollen. Pain is aching and sore in her posterior calf. Unsure what makes it better or worse. No radiation. She is otherwise feeling well with no other concerns or complaints at this time.   Relevant past medical, surgical, family and social history reviewed and updated as indicated. Interim medical history since our last visit reviewed. Allergies and medications reviewed and updated.  Review of Systems  Constitutional: Negative.   Respiratory: Negative.   Cardiovascular: Negative.   Musculoskeletal: Positive for myalgias. Negative for arthralgias, back pain, gait problem, joint swelling, neck pain and neck stiffness.  Skin: Negative.   Neurological: Negative.   Psychiatric/Behavioral: Negative.     Per HPI unless specifically indicated above     Objective:    BP 107/73 (BP Location: Right Arm, Patient Position: Sitting, Cuff Size: Normal)   Pulse 79   Temp 97.8 F (36.6 C) (Oral)   Ht  (1.6 m)   Wt 183 lb 11.2 oz (83.3 kg)   SpO2 99%   BMI 32.54 kg/m   Wt  Readings from Last 3 Encounters:  03/25/18 183 lb 11.2 oz (83.3 kg)  02/25/18 184 lb 4 oz (83.6 kg)  01/28/18 187 lb 9 oz (85.1 kg)    Physical Exam  Constitutional: She is oriented to person, place, and time. She appears well-developed and well-nourished. No distress.  HENT:  Head: Normocephalic and atraumatic.  Right Ear: Hearing normal.  Left Ear: Hearing normal.  Nose: Nose normal.  Eyes: Conjunctivae and lids are normal. Right eye exhibits no discharge. Left eye exhibits no discharge. No scleral icterus.  Pulmonary/Chest: Effort normal. No respiratory distress.  Abdominal: Soft. She exhibits no distension and no mass. There is no tenderness. There is no rebound and no guarding. No hernia.  Musculoskeletal: She exhibits no tenderness.  Neurological: She is alert and oriented to person, place, and time. She displays normal reflexes. No cranial nerve deficit or sensory deficit. She exhibits normal muscle tone. Coordination normal.  Skin: Skin is warm, dry and intact. Capillary refill takes less than 2 seconds. No rash noted. She is not diaphoretic. No erythema. No pallor.  Psychiatric: She has a normal mood and affect. Her speech is normal and behavior is normal. Judgment and thought content normal. Cognition and memory are normal.  Nursing note and vitals reviewed. Musculoskeletal:  Exam found Decreased  ROM, Tissue texture changes, Tenderness to palpation and Asymmetry of patient's  ribs, lumbar, pelvis, sacrum and lower extremity Osteopathic Structural Exam:   Ribs: Ribs 5-8 locked up on the L, Ribs 7-9 locked up oh the R  Lumbar: QL hypertonic on the R  Pelvis: Posterior R innominate  Sacrum: R on R torsion  Lower Extremity: facial strain from midfoot through shin and into knee on the R, Posterior fibular head on the R  Results for orders placed or performed in visit on 01/21/18  Ferritin  Result Value Ref Range   Ferritin 117 9 - 269 ng/mL  Iron and TIBC  Result Value Ref Range    Iron 80 41 - 142 ug/dL   TIBC 161 096 - 045 ug/dL   Saturation Ratios 23 21 - 57 %   UIBC 276 ug/dL  CBC with Differential (Cancer Center Only)  Result Value Ref Range   WBC Count 8.5 3.9 - 10.0 K/uL   RBC 4.68 3.70 - 5.32 MIL/uL   Hemoglobin 13.8 11.6 - 15.9 g/dL   HCT 40.9 81.1 - 91.4 %   MCV 87.8 81.0 - 101.0 fL   MCH 29.5 26.0 - 34.0 pg   MCHC 33.6 32.0 - 36.0 g/dL   RDW 78.2 (H) 95.6 - 21.3 %   Platelet Count 322 145 - 400 K/uL   Neutrophils Relative % 71 %   Neutro Abs 6.1 1.5 - 6.5 K/uL   Lymphocytes Relative 23 %   Lymphs Abs 1.9 0.9 - 3.3 K/uL   Monocytes Relative 4 %   Monocytes Absolute 0.4 0.1 - 0.9 K/uL   Eosinophils Relative 2 %   Eosinophils Absolute 0.2 0.0 - 0.5 K/uL   Basophils Relative 0 %   Basophils Absolute 0.0 0.0 - 0.1 K/uL      Assessment & Plan:   Problem List Items Addressed This Visit    None    Visit Diagnoses    Right leg swelling    -  Primary   Likely muscular strain, however, we will obtain US to R/O DVT. Seems to be myofacial in nature. Treated today as below. Will get orthotics. Call with concerns.    Relevant Orders   DG Foot Complete Right (Completed)   DG Tibia/Fibula Right (Completed)   US Venous Img Lower Unilateral Right (Completed)   Right leg pain       Likely muscular stratin. Will obtain x-ray. Await results.    Relevant Orders   DG Foot Complete Right (Completed)   DG Tibia/Fibula Right (Completed)   US Venous Img Lower Unilateral Right (Completed)   Right foot pain       Likely muscular stratin. Will obtain x-ray. Await results.    Relevant Orders   DG Foot Complete Right (Completed)   DG Tibia/Fibula Right (Completed)   US Venous Img Lower Unilateral Right (Completed)   Somatic dysfunction of sacral region       Somatic dysfunction of pelvis region       Somatic dysfunction of lumbar region       Somatic dysfunction of lower extremities       Somatic dysfunction of rib region         After verbal consent was  obtained, patient was treated today with osteopathic manipulative medicine to the regions of the ribs, lumbar, pelvis, sacrum and lower extremity using the techniques of cranial, myofascial release, muscle energy, HVLA and soft tissue. Areas of compensation relating to her primary pain source also  treated. Patient tolerated the procedure well with good objective and good subjective improvement in symptoms. She left the room in good condition. She was advised to stay well hydrated and that she may have some soreness following the procedure. If not improving or worsening, she will call and come in. She and will return for reevaluation  on a PRN basis.   Follow up plan: Return if symptoms worsen or fail to improve.

## 2018-04-21 ENCOUNTER — Telehealth: Payer: BLUE CROSS/BLUE SHIELD | Admitting: Family

## 2018-04-21 DIAGNOSIS — N390 Urinary tract infection, site not specified: Secondary | ICD-10-CM

## 2018-04-21 DIAGNOSIS — A499 Bacterial infection, unspecified: Secondary | ICD-10-CM

## 2018-04-21 MED ORDER — NITROFURANTOIN MONOHYD MACRO 100 MG PO CAPS: 100.0000 mg | ORAL_CAPSULE | Freq: Two times a day (BID) | ORAL | 0 refills | Status: DC

## 2018-04-21 NOTE — Progress Notes (Signed)

## 2018-04-23 ENCOUNTER — Other Ambulatory Visit: Payer: BLUE CROSS/BLUE SHIELD

## 2018-04-23 ENCOUNTER — Ambulatory Visit: Payer: BLUE CROSS/BLUE SHIELD | Admitting: Family

## 2018-05-06 ENCOUNTER — Inpatient Hospital Stay (HOSPITAL_BASED_OUTPATIENT_CLINIC_OR_DEPARTMENT_OTHER): Payer: BLUE CROSS/BLUE SHIELD | Admitting: Family

## 2018-05-06 ENCOUNTER — Other Ambulatory Visit: Payer: Self-pay

## 2018-05-06 ENCOUNTER — Inpatient Hospital Stay: Payer: BLUE CROSS/BLUE SHIELD | Attending: Hematology & Oncology

## 2018-05-06 ENCOUNTER — Encounter: Payer: Self-pay | Admitting: Family

## 2018-05-06 VITALS — BP 121/64 | HR 75 | Temp 98.8°F | Resp 20 | Wt 183.1 lb

## 2018-05-06 DIAGNOSIS — D500 Iron deficiency anemia secondary to blood loss (chronic): Secondary | ICD-10-CM

## 2018-05-06 DIAGNOSIS — N920 Excessive and frequent menstruation with regular cycle: Secondary | ICD-10-CM

## 2018-05-06 DIAGNOSIS — O99019 Anemia complicating pregnancy, unspecified trimester: Secondary | ICD-10-CM

## 2018-05-06 DIAGNOSIS — N921 Excessive and frequent menstruation with irregular cycle: Secondary | ICD-10-CM

## 2018-05-06 DIAGNOSIS — D509 Iron deficiency anemia, unspecified: Secondary | ICD-10-CM

## 2018-05-06 LAB — IRON AND TIBC
Iron: 72 ug/dL (ref 41–142)
Saturation Ratios: 17 % — ABNORMAL LOW (ref 21–57)
TIBC: 424 ug/dL (ref 236–444)
UIBC: 352 ug/dL

## 2018-05-06 LAB — CBC WITH DIFFERENTIAL (CANCER CENTER ONLY)
Basophils Absolute: 0 10*3/uL (ref 0.0–0.1)
Basophils Relative: 0 %
Eosinophils Absolute: 0.3 10*3/uL (ref 0.0–0.5)
Eosinophils Relative: 3 %
HCT: 41.7 % (ref 34.8–46.6)
Hemoglobin: 14.3 g/dL (ref 11.6–15.9)
Lymphocytes Relative: 21 %
Lymphs Abs: 1.8 10*3/uL (ref 0.9–3.3)
MCH: 32.7 pg (ref 26.0–34.0)
MCHC: 34.3 g/dL (ref 32.0–36.0)
MCV: 95.4 fL (ref 81.0–101.0)
Monocytes Absolute: 0.4 10*3/uL (ref 0.1–0.9)
Monocytes Relative: 4 %
Neutro Abs: 6.3 10*3/uL (ref 1.5–6.5)
Neutrophils Relative %: 72 %
Platelet Count: 320 10*3/uL (ref 145–400)
RBC: 4.37 MIL/uL (ref 3.70–5.32)
RDW: 12.4 % (ref 11.1–15.7)
WBC Count: 8.8 10*3/uL (ref 3.9–10.0)

## 2018-05-06 LAB — FERRITIN: Ferritin: 140 ng/mL (ref 11–307)

## 2018-05-06 NOTE — Progress Notes (Signed)
Hematology and Oncology Follow Up Visit  Juanetta BeetsRebekah Quesinberry [id number] Feb 23, 1986 32 y.o. 05/06/2018   Principle Diagnosis:  Iron deficiency anemia   Current Therapy:   IV iron (Injectafer) as indicated - last received in February 2019 x 2   Interim History:  Ms. [name] is here today for follow-up. She is doing well but still has some fatigue at times due to late nights with her little one.  She has a good appetite and is staying well hydrated. Her weight is stable.  She is doing nicely on oral birth control and has noted her cycle is lighter with the occasional day of breakthrough spotting between cycles.  No other bleeding noted. No bruising or petechiae.  No fever, chills, n/v, cough, rash, dizziness, SOB, chest pain, palpitations, abdominal pain or changes in bowel or bladder habits.  She has had some issues with oral thrush she feels is due to her Pulmicort inhaler and plans to speak with her PCP at her visit tomorrow about switching to something else.  No lymphadenopathy noted on exam.  No swelling, tenderness, numbness or tingling in her extremities at this time.   ECOG Performance Status: 1 - Symptomatic but completely ambulatory  Medications:  Allergies as of 05/06/2018      Reactions   Amoxicillin Hives   Has patient had a PCN reaction causing immediate rash, facial/tongue/throat swelling, SOB or lightheadedness with hypotension: {no Has patient had a PCN reaction causing severe rash involving mucus membranes or skin necrosis: {no Has patient had a PCN reaction that required hospitalization no Has patient had a PCN reaction occurring within the last 10 years: {yes If all of the above answers are "NO", then may proceed with Cephalosporin use.   Hydrocodone Hives   Tdap [tetanus-diphth-acell Pertussis] Other (See Comments)   Fever, Joint stiffness      Medication List        Accurate as of 05/06/18  9:18 AM. Always use your most recent med list.          albuterol  108 (90 Base) MCG/ACT inhaler Commonly known as:  PROVENTIL HFA;VENTOLIN HFA Use 2 puffs every four hours as needed for cough or wheeze.  May use 2 puffs 10-20 minutes prior to exercise.   budesonide 180 MCG/ACT inhaler Commonly known as:  PULMICORT FLEXHALER Use 2 puffs twice daily to prevent cough or wheeze.  Rinse, gargle, and spit after use.   cetirizine 10 MG tablet Commonly known as:  ZYRTEC Take 10 mg by mouth daily. Used in Spring & Fall, for seasonal allergies.   cyclobenzaprine 5 MG tablet Commonly known as:  FLEXERIL Take 0.5-1 tablets (2.5-5 mg total) by mouth at bedtime.   Levonorgestrel-Ethinyl Estradiol 0.1-0.02 & 0.01 MG tablet Commonly known as:  AMETHIA,CAMRESE Take 1 tablet by mouth daily.   nitrofurantoin (macrocrystal-monohydrate) 100 MG capsule Commonly known as:  MACROBID Take 1 capsule (100 mg total) by mouth 2 (two) times daily.   omeprazole 20 MG tablet Commonly known as:  PRILOSEC OTC Take 20 mg by mouth daily.   PRENATAL VITAMIN PO Take by mouth.       Allergies:  Allergies  Allergen Reactions  . Amoxicillin Hives    Has patient had a PCN reaction causing immediate rash, facial/tongue/throat swelling, SOB or lightheadedness with hypotension: {no Has patient had a PCN reaction causing severe rash involving mucus membranes or skin necrosis: {no Has patient had a PCN reaction that required hospitalization no Has patient had a PCN reaction occurring within the  last 10 years: {yes If all of the above answers are "NO", then may proceed with Cephalosporin use.  Marland Kitchen Hydrocodone Hives  . Tdap [Tetanus-Diphth-Acell Pertussis] Other (See Comments)    Fever, Joint stiffness    Past Medical History, Surgical history, Social history, and Family History were reviewed and updated.  Review of Systems: All other 10 point review of systems is negative.   Physical Exam:  weight is 183 lb 1.9 oz (83.1 kg). Her oral temperature is 98.8 F (37.1 C). Her blood  pressure is 121/64 and her pulse is 75. Her respiration is 20 and oxygen saturation is 100%.   Wt Readings from Last 3 Encounters:  05/06/18 183 lb 1.9 oz (83.1 kg)  03/25/18 183 lb 11.2 oz (83.3 kg)  02/25/18 184 lb 4 oz (83.6 kg)    Ocular: Sclerae unicteric, pupils equal, round and reactive to light Ear-nose-throat: Oropharynx clear, dentition fair Lymphatic: No cervical, supraclavicular or axillary adenopathy Lungs no rales or rhonchi, good excursion bilaterally Heart regular rate and rhythm, no murmur appreciated Abd soft, nontender, positive bowel sounds, no liver or spleen tip palpated on exam, no fluid wave  MSK no focal spinal tenderness, no joint edema Neuro: non-focal, well-oriented, appropriate affect Breasts: Deferred   Lab Results  Component Value Date   WBC 8.8 05/06/2018   HGB 14.3 05/06/2018   HCT 41.7 05/06/2018   MCV 95.4 05/06/2018   PLT 320 05/06/2018   Lab Results  Component Value Date   FERRITIN 117 01/21/2018   IRON 80 01/21/2018   TIBC 356 01/21/2018   UIBC 276 01/21/2018   IRONPCTSAT 23 01/21/2018   Lab Results  Component Value Date   RETICCTPCT 1.4 12/08/2017   RBC 4.37 05/06/2018   No results found for: KPAFRELGTCHN, LAMBDASER, KAPLAMBRATIO No results found for: IGGSERUM, IGA, IGMSERUM No results found for: Dorene Ar, A1GS, A2GS, Colin Benton, MSPIKE, SPEI   Chemistry      Component Value Date/Time   NA 140 12/08/2017 1003   K 3.3 12/08/2017 1003   CL 107 12/08/2017 1003   CO2 26 12/08/2017 1003   BUN 7 12/08/2017 1003   CREATININE 0.80 12/08/2017 1003      Component Value Date/Time   CALCIUM 9.0 12/08/2017 1003   ALKPHOS 76 12/08/2017 1003   AST 20 12/08/2017 1003   ALT 18 12/08/2017 1003   BILITOT 0.5 12/08/2017 1003      Impression and Plan: Ms. Campo is a very pleasant 32 yo caucasian female with history of iron deficiency anemia due to heavy cycles. She is doing well and has responded nicely to the  IV iron she received in February as well as having a lighter cycle on birth control.  We will see what her iron studies show and bring her back in for an infusion if needed.  We will go ahead and plan to see her back in another 4 months for follow-up.  She will contact our office with any questions or concerns. We can certainly see her sooner if need be.   Emeline Gins, NP 7/10/20199:18 AM

## 2018-05-07 ENCOUNTER — Other Ambulatory Visit: Payer: Self-pay

## 2018-05-07 ENCOUNTER — Ambulatory Visit (INDEPENDENT_AMBULATORY_CARE_PROVIDER_SITE_OTHER): Payer: BLUE CROSS/BLUE SHIELD | Admitting: Family Medicine

## 2018-05-07 ENCOUNTER — Encounter: Payer: Self-pay | Admitting: Family Medicine

## 2018-05-07 VITALS — BP 116/74 | HR 88 | Temp 98.5°F | Ht 63.0 in | Wt 184.1 lb

## 2018-05-07 DIAGNOSIS — M9901 Segmental and somatic dysfunction of cervical region: Secondary | ICD-10-CM | POA: Diagnosis not present

## 2018-05-07 DIAGNOSIS — M546 Pain in thoracic spine: Secondary | ICD-10-CM

## 2018-05-07 DIAGNOSIS — M9904 Segmental and somatic dysfunction of sacral region: Secondary | ICD-10-CM

## 2018-05-07 DIAGNOSIS — B370 Candidal stomatitis: Secondary | ICD-10-CM | POA: Diagnosis not present

## 2018-05-07 DIAGNOSIS — G5621 Lesion of ulnar nerve, right upper limb: Secondary | ICD-10-CM

## 2018-05-07 DIAGNOSIS — M9905 Segmental and somatic dysfunction of pelvic region: Secondary | ICD-10-CM

## 2018-05-07 DIAGNOSIS — N644 Mastodynia: Secondary | ICD-10-CM

## 2018-05-07 DIAGNOSIS — M9908 Segmental and somatic dysfunction of rib cage: Secondary | ICD-10-CM

## 2018-05-07 DIAGNOSIS — M9902 Segmental and somatic dysfunction of thoracic region: Secondary | ICD-10-CM

## 2018-05-07 DIAGNOSIS — M9900 Segmental and somatic dysfunction of head region: Secondary | ICD-10-CM

## 2018-05-07 DIAGNOSIS — M79604 Pain in right leg: Secondary | ICD-10-CM

## 2018-05-07 MED ORDER — FLUCONAZOLE 100 MG PO TABS: 100.0000 mg | ORAL_TABLET | Freq: Every day | ORAL | 1 refills | Status: DC

## 2018-05-07 NOTE — Patient Instructions (Signed)
Cubital Tunnel Syndrome Cubital tunnel syndrome is a condition that causes pain and weakness of the forearm and hand. This condition happens when one of the nerves (ulnar nerve) that runs alongside the elbow joint becomes irritated. What are the causes? Causes of this condition include:  Increased pressure on the ulnar nerve at the elbow, arm, or forearm. This can be caused by: ? Swollen tissues. ? Ligaments. ? Muscles. ? Poorly healed elbow fractures. ? Tumors in the elbow. These are usually noncancerous (benign). ? Scar tissue that develops in the elbow after an injury. ? Bony growths (spurs) near the ulnar nerve.  Stretching of the nerve due to loose elbow ligaments.  Trauma to the nerve at the elbow.  Repetitive elbow bending.  Certain medical conditions.  What increases the risk? This condition is more likely to develop in:  People who do manual labor that requires frequently bending the elbow.  People who play sports that include repeated or strenuous throwing motions, such as baseball.  People who play contact sports, such as football or lacrosse.  People who do not warm up properly before activities.  People who have diabetes.  People who have an underactive thyroid (hypothyroidism).  What are the signs or symptoms? Symptoms of this condition include:  Clumsiness or weakness of the hand.  Tenderness of the inner elbow.  Aching or soreness of the inner elbow, forearm, or fingers, especially the little finger or the ring finger.  Increased pain with forced elbow bending.  Reduced control when throwing.  Tingling, numbness, or burning inside the forearm, or in part of the hand or fingers, especially the little finger or the ring finger.  Sharp pains that shoot from the elbow down to the wrist and hand.  The inability to grip or pinch hard.  How is this diagnosed? This condition is diagnosed with a medical history and physical exam. Your health care  provider will ask about your symptoms and ask for details about any injury. You may also have other tests, including:  Electromyogram (EMG). This test checks how well the nerve is working.  X-ray.  How is this treated? Treatment starts by stopping the activities that are causing your symptoms to get worse. Treatment may include the use of icing and medicines to reduce pain and swelling. You may also be advised to wear a splint to prevent your elbow from bending or wear an elbow pad where the ulnar nerve is closest to the skin. In less severe cases, treatment may also include working with a physical therapist:  To help decrease your symptoms.  To improve the strength and range of motion of your elbow, forearm, and hand.  If the treatments described above do not help, surgery may be needed. Follow these instructions at home: If you have a splint:  Wear it as told by your health care provider. Remove it only as told by your health care provider.  Loosen the splint if your fingers become numb and tingle, or if they turn cold and blue.  Keep the splint clean and dry. Managing pain, stiffness, and swelling  If directed, apply ice to the injured area: ? Put ice in a plastic bag. ? Place a towel between your skin and the bag. ? Leave the ice on for 20 minutes, 2-3 times per day.  Move your fingers often to avoid stiffness and to lessen swelling.  Raise (elevate) the injured area above the level of your heart while you are sitting or lying down. General   instructions  Take over-the-counter and prescription medicines only as told by your health care provider.  Keep all follow-up visits as told by your health care provider. This is important.  Do any exercise or physical therapy as told by your health care provider.  Do not drive or operate heavy machinery while taking prescription pain medicine.  If you were given an elbow pad, wear it as told by your health care provider. Contact a  health care provider if:  Your symptoms get worse.  Your symptoms do not get better with treatment.  Your have new pain.  Your hand on the injured side feels numb or cold. This information is not intended to replace advice given to you by your health care provider. Make sure you discuss any questions you have with your health care provider. Document Released: 10/14/2005 Document Revised: 03/21/2016 Document Reviewed: 12/21/2014 Elsevier Interactive Patient Education  2018 [employer]  Ulnar Nerve Contusion Rehab Ask your health care provider which exercises are safe for you. Do exercises exactly as told by your health care provider and adjust them as directed. It is normal to feel mild stretching, pulling, tightness, or discomfort as you do these exercises, but you should stop right away if you feel sudden pain or your pain gets worse. Do not begin these exercises until told by your health care provider. Stretching and range of motion exercises These exercises warm up your muscles and joints and improve the movement and flexibility of your forearm. These exercises also help to relieve pain, numbness, and tingling. Exercise A: Extensor stretch 1. Stand over a tabletop with your __________ hand resting palm-up on the tabletop and your fingers pointing away from your body. Your arm should be extended and there should be a slight bend in your elbow. 2. Gently press the back of your hand down onto the table by straightening your elbow. You should feel a stretch on the top of your forearm. 3. Hold this position for __________ seconds. 4. Slowly return to the starting position. Repeat __________ times. Complete this stretch __________ times a day. Exercise B: Flexor stretch  1. Stand over a tabletop with your __________ hand resting palm-down on the tabletop and your fingers pointing away from your body. Your arm should be extended and there should be a slight bend in your elbow. 2. Gently  press your fingers and palm down onto the table by straightening your elbow. You should feel a stretch on the inside of your forearm. 3. Hold this position for __________ seconds. 4. Slowly return to the starting position. Repeat __________ times. Complete this stretch __________ times a day. Exercise C: Ulnar nerve glide, hand moving 1. Stand or sit with your left / right elbow bent and your hand at the height of your shoulder. 2. Move your elbow about 6 inches (15 cm) away from your body. 3. Gently and slowly wave your hand back and forth. 4. Repeat this motion for __________ seconds. Repeat __________ times. Complete this exercise __________ times a day. Exercise D: Ulnar nerve glide, elbow moving 1. Stand or sit with your left / right arm straight out to your side at shoulder height. 2. Bring your fingers up toward the ceiling so your palm faces away from you. 3. Bend your elbow and bring it to your side and bend your wrist so your palm now faces the floor. 4. Slowly go back and forth between doing the step 2 position and the step 3 position. 5. Repeat these motions for  __________ seconds. Repeat __________ times. Complete this exercise __________ times a day. Strengthening exercises These exercises build strength and endurance in your forearm. Endurance is the ability to use your muscles for a long time, even after they get tired. Exercise E: Grip  1. Hold one of these items in your __________ hand: a dense sponge, a tennis ball, or a large, rolled sock. 2. Slowly squeeze as hard as you can without increasing any pain. 3. Hold this position for __________ seconds. 4. Slowly release your grip. Repeat __________ times. Complete this exercise __________ times a day. This information is not intended to replace advice given to you by your health care provider. Make sure you discuss any questions you have with your health care provider. Document Released: 10/14/2005 Document Revised:  06/20/2016 Document Reviewed: 08/24/2015 Elsevier Interactive Patient Education  [employer].

## 2018-05-07 NOTE — Progress Notes (Signed)
BP 116/74   Pulse 88   Temp 98.5 F (36.9 C) (Oral)   Ht 5\' 3"  (1.6 m)   Wt 184 lb 2 oz (83.5 kg)   SpO2 98%   BMI 32.62 kg/m    Subjective:    Patient ID: Karla BeetsRebekah Huber, female    DOB: 25-Oct-1986, 32 y.o.   MRN: [id number]  HPI: Karla Huber is a 32 y.o. female  Chief Complaint  Patient presents with  . Follow-up   Leg is better. She is not sure what happened. Is happy about it, but notes that it just got better one day. She doesn't know why. No pain. Has been walking more and doesn't feel worse.   Has had a sore throat for a couple of weeks- has thrush at the back of her throat because of her proventil.  Has used magic mouthwash without benefit.  Finger tips keep going numb. Had issues with carpal tunnel in the past. Has issues with her elbows. This has been an issue since she was pregnant, but seems to be getting worse.   R breast is killing her- since she weaned her son. It's not red. Nipple gets almost black. Has tried using hot compresses. Not getting any better.   She notes that she does have a bit of pain in her R thorax. It is from carrying her children. She notes that it's better with stretching and OMT and worse with long car drives. Has been worse since getting back from vacation. It's sore in nature. No radiation. She is otherwise doing well with no other concerns or complaints at this time.   Relevant past medical, surgical, family and social history reviewed and updated as indicated. Interim medical history since our last visit reviewed. Allergies and medications reviewed and updated.  Review of Systems  Constitutional: Negative.   HENT: Positive for sore throat. Negative for congestion, dental problem, drooling, ear discharge, ear pain, facial swelling, hearing loss, mouth sores, nosebleeds, postnasal drip, rhinorrhea, sinus pressure, sinus pain, sneezing, tinnitus, trouble swallowing and voice change.   Respiratory: Negative.   Cardiovascular: Negative.     Gastrointestinal: Negative.   Musculoskeletal: Positive for back pain and myalgias. Negative for arthralgias, gait problem, joint swelling, neck pain and neck stiffness.  Skin: Negative.   Neurological: Negative.   Psychiatric/Behavioral: Negative.     Per HPI unless specifically indicated above     Objective:    BP 116/74   Pulse 88   Temp 98.5 F (36.9 C) (Oral)   Ht 5\' 3"  (1.6 m)   Wt 184 lb 2 oz (83.5 kg)   SpO2 98%   BMI 32.62 kg/m   Wt Readings from Last 3 Encounters:  05/07/18 184 lb 2 oz (83.5 kg)  05/06/18 183 lb 1.9 oz (83.1 kg)  03/25/18 183 lb 11.2 oz (83.3 kg)    Physical Exam  Constitutional: She is oriented to person, place, and time. She appears well-developed and well-nourished. No distress.  HENT:  Head: Normocephalic and atraumatic.  Right Ear: Hearing normal.  Left Ear: Hearing normal.  Nose: Nose normal.  +thrush  Eyes: Conjunctivae and lids are normal. Right eye exhibits no discharge. Left eye exhibits no discharge. No scleral icterus.  Cardiovascular: Normal rate, regular rhythm, normal heart sounds and intact distal pulses. Exam reveals no gallop and no friction rub.  No murmur heard. Pulmonary/Chest: Effort normal and breath sounds normal. No stridor. No respiratory distress. She has no wheezes. She has no rales. She exhibits no  tenderness.  Neurological: She is alert and oriented to person, place, and time.  + Tinel's over the cubital tunnel  Skin: Skin is warm, dry and intact. Capillary refill takes less than 2 seconds. No rash noted. She is not diaphoretic. No erythema. No pallor.  Psychiatric: She has a normal mood and affect. Her speech is normal and behavior is normal. Judgment and thought content normal. Cognition and memory are normal.  Nursing note and vitals reviewed. Musculoskeletal:  Exam found Decreased ROM, Tissue texture changes, Tenderness to palpation and Asymmetry of patient's  head, neck, thorax, ribs, pelvis and  sacrum Osteopathic Structural Exam:   Head: OAESSR, R torsion  Neck: C3ESRR, SCM hypertonic on the R  Thorax: T3-5SLRR  Ribs: Ribs 5-8 locked up on the R, Rib 6 locked up on the L  Pelvis: Posterior R innominate  Sacrum: L on L torsion   Results for orders placed or performed in visit on 05/06/18  CBC with Differential (Cancer Center Only)  Result Value Ref Range   WBC Count 8.8 3.9 - 10.0 K/uL   RBC 4.37 3.70 - 5.32 MIL/uL   Hemoglobin 14.3 11.6 - 15.9 g/dL   HCT 16.1 09.6 - 04.5 %   MCV 95.4 81.0 - 101.0 fL   MCH 32.7 26.0 - 34.0 pg   MCHC 34.3 32.0 - 36.0 g/dL   RDW 40.9 81.1 - 91.4 %   Platelet Count 320 145 - 400 K/uL   Neutrophils Relative % 72 %   Neutro Abs 6.3 1.5 - 6.5 K/uL   Lymphocytes Relative 21 %   Lymphs Abs 1.8 0.9 - 3.3 K/uL   Monocytes Relative 4 %   Monocytes Absolute 0.4 0.1 - 0.9 K/uL   Eosinophils Relative 3 %   Eosinophils Absolute 0.3 0.0 - 0.5 K/uL   Basophils Relative 0 %   Basophils Absolute 0.0 0.0 - 0.1 K/uL  Iron and TIBC  Result Value Ref Range   Iron 72 41 - 142 ug/dL   TIBC 782 956 - 213 ug/dL   Saturation Ratios 17 (L) 21 - 57 %   UIBC 352 ug/dL  Ferritin  Result Value Ref Range   Ferritin 140 11 - 307 ng/mL      Assessment & Plan:   Problem List Items Addressed This Visit    None    Visit Diagnoses    Breast pain, right    -  Primary   Encouraged warm compresses and massage. If not getting better, or getting worse, call.    Thrush       Will treat with diflucan. Call with any concerns. Continue to monitor.    Relevant Medications   fluconazole (DIFLUCAN) 100 MG tablet   Cubital tunnel syndrome on right       Stretches given. Call with any concerns. Continue to monitor.    Right leg pain       Resolved.    Acute right-sided thoracic back pain       Seems to be due to carrying her children. She does have somatic dysfunction that is contributing to her symptoms. Treated today as below with good results.    Somatic  dysfunction of sacral region       Somatic dysfunction of pelvis region       Somatic dysfunction of rib region       Thoracic segment dysfunction       Cervical (neck) region somatic dysfunction       Head region somatic  dysfunction         After verbal consent was obtained, patient was treated today with osteopathic manipulative medicine to the regions of the head, neck, thorax, ribs, pelvis and sacrum using the techniques of cranial, myofascial release, HVLA and soft tissue. Areas of compensation relating to her primary pain source also treated. Patient tolerated the procedure well with good objective and good subjective improvement in symptoms. .sex left the room in good condition. She was advised to stay well hydrated and that she may have some soreness following the procedure. If not improving or worsening, she will call and come in. She will return for reevaluation  on a PRN basis.   Follow up plan: Return in about 6 weeks (around 06/18/2018).

## 2018-05-09 DIAGNOSIS — N3001 Acute cystitis with hematuria: Secondary | ICD-10-CM | POA: Diagnosis not present

## 2018-05-14 ENCOUNTER — Other Ambulatory Visit: Payer: Self-pay | Admitting: Family

## 2018-05-14 ENCOUNTER — Inpatient Hospital Stay: Payer: BLUE CROSS/BLUE SHIELD

## 2018-05-14 VITALS — BP 124/66 | HR 100 | Resp 17

## 2018-05-14 DIAGNOSIS — D62 Acute posthemorrhagic anemia: Secondary | ICD-10-CM

## 2018-05-14 DIAGNOSIS — N920 Excessive and frequent menstruation with regular cycle: Secondary | ICD-10-CM | POA: Diagnosis not present

## 2018-05-14 DIAGNOSIS — N921 Excessive and frequent menstruation with irregular cycle: Secondary | ICD-10-CM

## 2018-05-14 DIAGNOSIS — K909 Intestinal malabsorption, unspecified: Secondary | ICD-10-CM

## 2018-05-14 DIAGNOSIS — D500 Iron deficiency anemia secondary to blood loss (chronic): Secondary | ICD-10-CM | POA: Diagnosis not present

## 2018-05-14 MED ORDER — SODIUM CHLORIDE 0.9 % IV SOLN
750.0000 mg | Freq: Once | INTRAVENOUS | Status: AC
  Administered 2018-05-14: 750 mg via INTRAVENOUS
  Filled 2018-05-14: qty 15

## 2018-05-14 MED ORDER — DIPHENHYDRAMINE HCL 25 MG PO CAPS
25.0000 mg | ORAL_CAPSULE | Freq: Once | ORAL | Status: AC
  Administered 2018-05-14: 25 mg via ORAL

## 2018-05-14 MED ORDER — FAMOTIDINE IN NACL 20-0.9 MG/50ML-% IV SOLN
40.0000 mg | Freq: Once | INTRAVENOUS | Status: AC
  Administered 2018-05-14: 40 mg via INTRAVENOUS

## 2018-05-14 NOTE — Patient Instructions (Signed)
Ferric carboxymaltose injection What is this medicine? FERRIC CARBOXYMALTOSE (ferr-ik car-box-ee-mol-toes) is an iron complex. Iron is used to make healthy red blood cells, which carry oxygen and nutrients throughout the body. This medicine is used to treat anemia in people with chronic kidney disease or people who cannot take iron by mouth. This medicine may be used for other purposes; ask your health care provider or pharmacist if you have questions. COMMON BRAND NAME(S): Injectafer What should I tell my health care provider before I take this medicine? They need to know if you have any of these conditions: -anemia not caused by low iron levels -high levels of iron in the blood -liver disease -an unusual or allergic reaction to iron, other medicines, foods, dyes, or preservatives -pregnant or trying to get pregnant -breast-feeding How should I use this medicine? This medicine is for infusion into a vein. It is given by a health care professional in a hospital or clinic setting. Talk to your pediatrician regarding the use of this medicine in children. Special care may be needed. Overdosage: If you think you have taken too much of this medicine contact a poison control center or emergency room at once. NOTE: This medicine is only for you. Do not share this medicine with others. What if I miss a dose? It is important not to miss your dose. Call your doctor or health care professional if you are unable to keep an appointment. What may interact with this medicine? Do not take this medicine with any of the following medications: -deferoxamine -dimercaprol -other iron products This medicine may also interact with the following medications: -chloramphenicol -deferasirox This list may not describe all possible interactions. Give your health care provider a list of all the medicines, herbs, non-prescription drugs, or dietary supplements you use. Also tell them if you smoke, drink alcohol, or use  illegal drugs. Some items may interact with your medicine. What should I watch for while using this medicine? Visit your doctor or health care professional regularly. Tell your doctor if your symptoms do not start to get better or if they get worse. You may need blood work done while you are taking this medicine. You may need to follow a special diet. Talk to your doctor. Foods that contain iron include: whole grains/cereals, dried fruits, beans, or peas, leafy green vegetables, and organ meats (liver, kidney). What side effects may I notice from receiving this medicine? Side effects that you should report to your doctor or health care professional as soon as possible: -allergic reactions like skin rash, itching or hives, swelling of the face, lips, or tongue -breathing problems -changes in blood pressure -feeling faint or lightheaded, falls -flushing, sweating, or hot feelings Side effects that usually do not require medical attention (report to your doctor or health care professional if they continue or are bothersome): -changes in taste -constipation -dizziness -headache -nausea -pain, redness, or irritation at site where injected -vomiting This list may not describe all possible side effects. Call your doctor for medical advice about side effects. You may report side effects to FDA at 1-800-FDA-1088. Where should I keep my medicine? This drug is given in a hospital or clinic and will not be stored at home. NOTE: This sheet is a summary. It may not cover all possible information. If you have questions about this medicine, talk to your doctor, pharmacist, or health care provider.  2018 Elsevier/Gold Standard (2015-11-16 11:20:47)  

## 2018-06-26 ENCOUNTER — Encounter: Payer: Self-pay | Admitting: Family Medicine

## 2018-06-26 ENCOUNTER — Ambulatory Visit (INDEPENDENT_AMBULATORY_CARE_PROVIDER_SITE_OTHER): Payer: BLUE CROSS/BLUE SHIELD | Admitting: Family Medicine

## 2018-06-26 VITALS — BP 111/73 | HR 105 | Temp 98.9°F | Ht 63.0 in | Wt 186.2 lb

## 2018-06-26 DIAGNOSIS — J3089 Other allergic rhinitis: Secondary | ICD-10-CM | POA: Insufficient documentation

## 2018-06-26 DIAGNOSIS — J302 Other seasonal allergic rhinitis: Secondary | ICD-10-CM | POA: Diagnosis not present

## 2018-06-26 DIAGNOSIS — J309 Allergic rhinitis, unspecified: Secondary | ICD-10-CM | POA: Insufficient documentation

## 2018-06-26 DIAGNOSIS — J4520 Mild intermittent asthma, uncomplicated: Secondary | ICD-10-CM | POA: Diagnosis not present

## 2018-06-26 DIAGNOSIS — M2550 Pain in unspecified joint: Secondary | ICD-10-CM | POA: Diagnosis not present

## 2018-06-26 MED ORDER — MONTELUKAST SODIUM 10 MG PO TABS: 10.0000 mg | ORAL_TABLET | Freq: Every day | ORAL | 3 refills | Status: DC

## 2018-06-26 MED ORDER — ALBUTEROL SULFATE HFA 108 (90 BASE) MCG/ACT IN AERS: INHALATION_SPRAY | RESPIRATORY_TRACT | 1 refills | Status: DC

## 2018-06-26 MED ORDER — FLUTICASONE PROPIONATE HFA 44 MCG/ACT IN AERO: 2.0000 | INHALATION_SPRAY | Freq: Two times a day (BID) | RESPIRATORY_TRACT | 12 refills | Status: DC

## 2018-06-26 NOTE — Progress Notes (Signed)
BP 111/73 (BP Location: Left Arm, Patient Position: Sitting, Cuff Size: Normal)   Pulse (!) 105   Temp 98.9 F (37.2 C) (Oral)   Ht 5' 3"  (1.6 m)   Wt 186 lb 3.2 oz (84.5 kg)   SpO2 95%   BMI 32.98 kg/m    Subjective:    Patient ID: Karla Huber, female    DOB: April 13, 1986, 32 y.o.   MRN: [id number]  HPI: Karla Huber is a 32 y.o. female  Chief Complaint  Patient presents with  . Allergies  . Asthma   Allergies are bothering her a whole lot. Wants to change her pulmicort as her asthma is also acting up. She notes that when she was on  flovent in the past with good results. Had allergy testing a couple of years ago without anything popping up.  Today, feels like something is stuck in her throat. She has been having a sore throat. No fevers. No chills. Headaches- feels like her head is full and feels like her head is a balloon. Has been stuffy with runny nose. She is otherwise doing well with no other concerns or complaints at this time.   Has had joint pain for most of her adult life. She doesn't think she's ever had an ANA checked. No other concerns.   Relevant past medical, surgical, family and social history reviewed and updated as indicated. Interim medical history since our last visit reviewed. Allergies and medications reviewed and updated.  Review of Systems  Constitutional: Negative.   HENT: Positive for congestion, ear pain, facial swelling, postnasal drip, rhinorrhea, sinus pressure, sinus pain, sneezing and sore throat. Negative for dental problem, drooling, ear discharge, hearing loss, mouth sores, nosebleeds, tinnitus, trouble swallowing and voice change.   Respiratory: Positive for cough, shortness of breath and wheezing. Negative for apnea, choking, chest tightness and stridor.   Cardiovascular: Negative.   Neurological: Negative.   Psychiatric/Behavioral: Negative.     Per HPI unless specifically indicated above     Objective:    BP 111/73 (BP  Location: Left Arm, Patient Position: Sitting, Cuff Size: Normal)   Pulse (!) 105   Temp 98.9 F (37.2 C) (Oral)   Ht 5' 3"  (1.6 m)   Wt 186 lb 3.2 oz (84.5 kg)   SpO2 95%   BMI 32.98 kg/m   Wt Readings from Last 3 Encounters:  06/26/18 186 lb 3.2 oz (84.5 kg)  05/07/18 184 lb 2 oz (83.5 kg)  05/06/18 183 lb 1.9 oz (83.1 kg)    Physical Exam  Constitutional: She is oriented to person, place, and time. She appears well-developed and well-nourished. No distress.  HENT:  Head: Normocephalic and atraumatic.  Right Ear: Hearing and external ear normal.  Left Ear: Hearing and external ear normal.  Nose: Nose normal.  Mouth/Throat: Oropharynx is clear and moist. No oropharyngeal exudate.  Eyes: Pupils are equal, round, and reactive to light. Conjunctivae, EOM and lids are normal. Right eye exhibits no discharge. Left eye exhibits no discharge. No scleral icterus.  Neck: Normal range of motion. Neck supple. No JVD present. No tracheal deviation present. No thyromegaly present.  Cardiovascular: Normal rate, regular rhythm, normal heart sounds and intact distal pulses. Exam reveals no gallop and no friction rub.  No murmur heard. Pulmonary/Chest: Effort normal and breath sounds normal. No stridor. No respiratory distress. She has no wheezes. She has no rales. She exhibits no tenderness.  Musculoskeletal: Normal range of motion.  Lymphadenopathy:    She has no  cervical adenopathy.  Neurological: She is alert and oriented to person, place, and time.  Skin: Skin is warm, dry and intact. Capillary refill takes less than 2 seconds. No rash noted. She is not diaphoretic. No erythema. No pallor.  Psychiatric: She has a normal mood and affect. Her speech is normal and behavior is normal. Judgment and thought content normal. Cognition and memory are normal.  Nursing note and vitals reviewed.   Results for orders placed or performed in visit on 06/26/18  RA Qn+CCP(IgG/A)+SjoSSA+SjoSSB  Result Value  Ref Range   Rhuematoid fact SerPl-aCnc <10.0 0.0 - 13.9 IU/mL   ENA SSA (RO) Ab <0.2 0.0 - 0.9 AI   ENA SSB (LA) Ab <0.2 0.0 - 0.9 AI   Cyclic Citrullin Peptide Ab 2 0 - 19 units  C-reactive protein  Result Value Ref Range   CRP 10 0 - 10 mg/L  Antinuclear Antib (ANA)  Result Value Ref Range   Anti Nuclear Antibody(ANA) Negative Negative  Sed Rate (ESR)  Result Value Ref Range   Sed Rate 35 (H) 0 - 32 mm/hr  Thyroid Panel With TSH  Result Value Ref Range   TSH 1.990 0.450 - 4.500 uIU/mL   T4, Total 10.6 4.5 - 12.0 ug/dL   T3 Uptake Ratio 20 (L) 24 - 39 %   Free Thyroxine Index 2.1 1.2 - 4.9  Comprehensive metabolic panel  Result Value Ref Range   Glucose 103 (H) 65 - 99 mg/dL   BUN 9 6 - 20 mg/dL   Creatinine, Ser 0.72 0.57 - 1.00 mg/dL   GFR calc non Af Amer 110 >59 mL/min/1.73   GFR calc Af Amer 127 >59 mL/min/1.73   BUN/Creatinine Ratio 13 9 - 23   Sodium 139 134 - 144 mmol/L   Potassium 4.5 3.5 - 5.2 mmol/L   Chloride 103 96 - 106 mmol/L   CO2 18 (L) 20 - 29 mmol/L   Calcium 10.1 8.7 - 10.2 mg/dL   Total Protein 8.1 6.0 - 8.5 g/dL   Albumin 5.1 3.5 - 5.5 g/dL   Globulin, Total 3.0 1.5 - 4.5 g/dL   Albumin/Globulin Ratio 1.7 1.2 - 2.2   Bilirubin Total 0.3 0.0 - 1.2 mg/dL   Alkaline Phosphatase 84 39 - 117 IU/L   AST 17 0 - 40 IU/L   ALT 28 0 - 32 IU/L  CBC with Differential/Platelet  Result Value Ref Range   WBC 8.0 3.4 - 10.8 x10E3/uL   RBC 4.66 3.77 - 5.28 x10E6/uL   Hemoglobin 15.4 11.1 - 15.9 g/dL   Hematocrit 46.1 34.0 - 46.6 %   MCV 99 (H) 79 - 97 fL   MCH 33.0 26.6 - 33.0 pg   MCHC 33.4 31.5 - 35.7 g/dL   RDW 12.7 12.3 - 15.4 %   Platelets 339 150 - 450 x10E3/uL   Neutrophils 72 Not Estab. %   Lymphs 23 Not Estab. %   Monocytes 4 Not Estab. %   Eos 1 Not Estab. %   Basos 0 Not Estab. %   Neutrophils Absolute 5.7 1.4 - 7.0 x10E3/uL   Lymphocytes Absolute 1.8 0.7 - 3.1 x10E3/uL   Monocytes Absolute 0.4 0.1 - 0.9 x10E3/uL   EOS (ABSOLUTE) 0.1 0.0 -  0.4 x10E3/uL   Basophils Absolute 0.0 0.0 - 0.2 x10E3/uL   Immature Granulocytes 0 Not Estab. %   Immature Grans (Abs) 0.0 0.0 - 0.1 x10E3/uL  VITAMIN D 25 Hydroxy (Vit-D Deficiency, Fractures)  Result Value Ref Range  Vit D, 25-Hydroxy 27.9 (L) 30.0 - 100.0 ng/mL      Assessment & Plan:   Problem List Items Addressed This Visit      Respiratory   Allergic rhinitis - Primary    Will start her on singulair. Continue zytrec. Call with any concerns or if not getting better.        Asthma    Not doing great on her pulmicort. Would like to start on flovent. Call with any concerns or if not getting better.       Relevant Medications   montelukast (SINGULAIR) 10 MG tablet   fluticasone (FLOVENT HFA) 44 MCG/ACT inhaler   albuterol (PROVENTIL HFA;VENTOLIN HFA) 108 (90 Base) MCG/ACT inhaler    Other Visit Diagnoses    Arthralgia, unspecified joint       Will check labs. Await results. Call with any concerns.    Relevant Orders   RA Qn+CCP(IgG/A)+SjoSSA+SjoSSB (Completed)   C-reactive protein (Completed)   Antinuclear Antib (ANA) (Completed)   Sed Rate (ESR) (Completed)   Thyroid Panel With TSH (Completed)   Comprehensive metabolic panel (Completed)   CBC with Differential/Platelet (Completed)   VITAMIN D 25 Hydroxy (Vit-D Deficiency, Fractures) (Completed)       Follow up plan: Return if symptoms worsen or fail to improve.

## 2018-06-29 LAB — COMPREHENSIVE METABOLIC PANEL
ALT: 28 IU/L (ref 0–32)
AST: 17 IU/L (ref 0–40)
Albumin/Globulin Ratio: 1.7 (ref 1.2–2.2)
Albumin: 5.1 g/dL (ref 3.5–5.5)
Alkaline Phosphatase: 84 IU/L (ref 39–117)
BUN/Creatinine Ratio: 13 (ref 9–23)
BUN: 9 mg/dL (ref 6–20)
Bilirubin Total: 0.3 mg/dL (ref 0.0–1.2)
CO2: 18 mmol/L — ABNORMAL LOW (ref 20–29)
Calcium: 10.1 mg/dL (ref 8.7–10.2)
Chloride: 103 mmol/L (ref 96–106)
Creatinine, Ser: 0.72 mg/dL (ref 0.57–1.00)
GFR calc Af Amer: 127 mL/min/{1.73_m2} (ref >59)
GFR calc non Af Amer: 110 mL/min/{1.73_m2} (ref >59)
Globulin, Total: 3 g/dL (ref 1.5–4.5)
Glucose: 103 mg/dL — ABNORMAL HIGH (ref 65–99)
Potassium: 4.5 mmol/L (ref 3.5–5.2)
Sodium: 139 mmol/L (ref 134–144)
Total Protein: 8.1 g/dL (ref 6.0–8.5)

## 2018-06-29 LAB — CBC WITH DIFFERENTIAL/PLATELET
Basophils Absolute: 0 10*3/uL (ref 0.0–0.2)
Basos: 0 %
EOS (ABSOLUTE): 0.1 10*3/uL (ref 0.0–0.4)
Eos: 1 %
Hematocrit: 46.1 % (ref 34.0–46.6)
Hemoglobin: 15.4 g/dL (ref 11.1–15.9)
Immature Grans (Abs): 0 10*3/uL (ref 0.0–0.1)
Immature Granulocytes: 0 %
Lymphocytes Absolute: 1.8 10*3/uL (ref 0.7–3.1)
Lymphs: 23 %
MCH: 33 pg (ref 26.6–33.0)
MCHC: 33.4 g/dL (ref 31.5–35.7)
MCV: 99 fL — ABNORMAL HIGH (ref 79–97)
Monocytes Absolute: 0.4 10*3/uL (ref 0.1–0.9)
Monocytes: 4 %
Neutrophils Absolute: 5.7 10*3/uL (ref 1.4–7.0)
Neutrophils: 72 %
Platelets: 339 10*3/uL (ref 150–450)
RBC: 4.66 x10E6/uL (ref 3.77–5.28)
RDW: 12.7 % (ref 12.3–15.4)
WBC: 8 10*3/uL (ref 3.4–10.8)

## 2018-06-29 LAB — THYROID PANEL WITH TSH
Free Thyroxine Index: 2.1 (ref 1.2–4.9)
T3 Uptake Ratio: 20 % — ABNORMAL LOW (ref 24–39)
T4, Total: 10.6 ug/dL (ref 4.5–12.0)
TSH: 1.99 u[IU]/mL (ref 0.450–4.500)

## 2018-06-29 LAB — C-REACTIVE PROTEIN: CRP: 10 mg/L (ref 0–10)

## 2018-06-29 LAB — RA QN+CCP(IGG/A)+SJOSSA+SJOSSB
Cyclic Citrullin Peptide Ab: 2 units (ref 0–19)
ENA SSA (RO) Ab: <0.2 AI (ref 0.0–0.9)
ENA SSB (LA) Ab: <0.2 AI (ref 0.0–0.9)
Rheumatoid fact SerPl-aCnc: <10 IU/mL (ref 0.0–13.9)

## 2018-06-29 LAB — VITAMIN D 25 HYDROXY (VIT D DEFICIENCY, FRACTURES): Vit D, 25-Hydroxy: 27.9 ng/mL — ABNORMAL LOW (ref 30.0–100.0)

## 2018-06-29 LAB — SEDIMENTATION RATE: Sed Rate: 35 mm/hr — ABNORMAL HIGH (ref 0–32)

## 2018-06-29 LAB — ANA: Anti Nuclear Antibody(ANA): NEGATIVE

## 2018-06-30 ENCOUNTER — Encounter: Payer: Self-pay | Admitting: Family Medicine

## 2018-06-30 DIAGNOSIS — J45909 Unspecified asthma, uncomplicated: Secondary | ICD-10-CM | POA: Insufficient documentation

## 2018-06-30 NOTE — Assessment & Plan Note (Addendum)
Will start her on singulair. Continue zytrec. Call with any concerns or if not getting better.

## 2018-06-30 NOTE — Assessment & Plan Note (Addendum)
Not doing great on her pulmicort. Would like to start on flovent. Call with any concerns or if not getting better.

## 2018-07-31 ENCOUNTER — Encounter: Payer: Self-pay | Admitting: Family Medicine

## 2018-07-31 ENCOUNTER — Ambulatory Visit (INDEPENDENT_AMBULATORY_CARE_PROVIDER_SITE_OTHER): Payer: BLUE CROSS/BLUE SHIELD | Admitting: Family Medicine

## 2018-07-31 ENCOUNTER — Other Ambulatory Visit: Payer: Self-pay

## 2018-07-31 VITALS — BP 111/63 | HR 89 | Temp 98.8°F | Ht 63.0 in | Wt 187.0 lb

## 2018-07-31 DIAGNOSIS — M9904 Segmental and somatic dysfunction of sacral region: Secondary | ICD-10-CM | POA: Diagnosis not present

## 2018-07-31 DIAGNOSIS — M9908 Segmental and somatic dysfunction of rib cage: Secondary | ICD-10-CM

## 2018-07-31 DIAGNOSIS — M9905 Segmental and somatic dysfunction of pelvic region: Secondary | ICD-10-CM

## 2018-07-31 DIAGNOSIS — M9902 Segmental and somatic dysfunction of thoracic region: Secondary | ICD-10-CM

## 2018-07-31 DIAGNOSIS — M9903 Segmental and somatic dysfunction of lumbar region: Secondary | ICD-10-CM

## 2018-07-31 DIAGNOSIS — Z23 Encounter for immunization: Secondary | ICD-10-CM | POA: Diagnosis not present

## 2018-07-31 DIAGNOSIS — M546 Pain in thoracic spine: Secondary | ICD-10-CM

## 2018-07-31 NOTE — Progress Notes (Signed)
BP 111/63   Pulse 89   Temp 98.8 F (37.1 C) (Oral)   Ht 5' 3"  (1.6 m)   Wt 187 lb (84.8 kg)   SpO2 97%   BMI 33.13 kg/m    Subjective:    Patient ID: Karla Huber, female    DOB: December 13, 1985, 32 y.o.   MRN: [id number]  HPI: Karla Huber is a 32 y.o. female  Chief Complaint  Patient presents with  . Back Pain   Back is doing OK. Depends on the day. She notes that she did well following her last appointment. Her pain started a couple of days ago. She notes that it is still feeling a bit aching in the middle of her back. Better with OMT and worse with picking up her kids. No radiation. She is otherwise feeling well with no other concerns or complaints at this time.    Relevant past medical, surgical, family and social history reviewed and updated as indicated. Interim medical history since our last visit reviewed. Allergies and medications reviewed and updated.  Review of Systems  Constitutional: Negative.   Respiratory: Negative.   Cardiovascular: Negative.   Musculoskeletal: Positive for back pain and myalgias. Negative for arthralgias, gait problem, joint swelling, neck pain and neck stiffness.  Skin: Negative.   Neurological: Negative.   Psychiatric/Behavioral: Negative.     Per HPI unless specifically indicated above     Objective:    BP 111/63   Pulse 89   Temp 98.8 F (37.1 C) (Oral)   Ht 5' 3"  (1.6 m)   Wt 187 lb (84.8 kg)   SpO2 97%   BMI 33.13 kg/m   Wt Readings from Last 3 Encounters:  07/31/18 187 lb (84.8 kg)  06/26/18 186 lb 3.2 oz (84.5 kg)  05/07/18 184 lb 2 oz (83.5 kg)    Physical Exam  Constitutional: She is oriented to person, place, and time. She appears well-developed and well-nourished. No distress.  HENT:  Head: Normocephalic and atraumatic.  Right Ear: Hearing normal.  Left Ear: Hearing normal.  Nose: Nose normal.  Eyes: Conjunctivae and lids are normal. Right eye exhibits no discharge. Left eye exhibits no discharge. No  scleral icterus.  Pulmonary/Chest: Effort normal. No respiratory distress.  Abdominal: Soft. She exhibits no distension and no mass. There is no tenderness. There is no rebound and no guarding. No hernia.  Neurological: She is alert and oriented to person, place, and time.  Skin: Skin is warm and intact. Capillary refill takes less than 2 seconds. No rash noted. She is not diaphoretic. No erythema. No pallor.  Psychiatric: She has a normal mood and affect. Her speech is normal and behavior is normal. Judgment and thought content normal. Cognition and memory are normal.  Nursing note and vitals reviewed. Musculoskeletal:  Exam found Decreased ROM, Tissue texture changes, Tenderness to palpation and Asymmetry of patient's  thorax, ribs, lumbar, pelvis and sacrum Osteopathic Structural Exam:   Thorax: T4-6SLRR  Ribs: Ribs 4-6 locked up on the R, Ribs 5-9 locked up on the L  Lumbar: QL hypertonic on the R, L3-4SLRR  Pelvis: Posterior R innominate  Sacrum: R on R torsion   Results for orders placed or performed in visit on 06/26/18  RA Qn+CCP(IgG/A)+SjoSSA+SjoSSB  Result Value Ref Range   Rhuematoid fact SerPl-aCnc <10.0 0.0 - 13.9 IU/mL   ENA SSA (RO) Ab <0.2 0.0 - 0.9 AI   ENA SSB (LA) Ab <0.2 0.0 - 0.9 AI   Cyclic Citrullin Peptide Ab  2 0 - 19 units  C-reactive protein  Result Value Ref Range   CRP 10 0 - 10 mg/L  Antinuclear Antib (ANA)  Result Value Ref Range   Anti Nuclear Antibody(ANA) Negative Negative  Sed Rate (ESR)  Result Value Ref Range   Sed Rate 35 (H) 0 - 32 mm/hr  Thyroid Panel With TSH  Result Value Ref Range   TSH 1.990 0.450 - 4.500 uIU/mL   T4, Total 10.6 4.5 - 12.0 ug/dL   T3 Uptake Ratio 20 (L) 24 - 39 %   Free Thyroxine Index 2.1 1.2 - 4.9  Comprehensive metabolic panel  Result Value Ref Range   Glucose 103 (H) 65 - 99 mg/dL   BUN 9 6 - 20 mg/dL   Creatinine, Ser 0.72 0.57 - 1.00 mg/dL   GFR calc non Af Amer 110 >59 mL/min/1.73   GFR calc Af Amer 127 >59  mL/min/1.73   BUN/Creatinine Ratio 13 9 - 23   Sodium 139 134 - 144 mmol/L   Potassium 4.5 3.5 - 5.2 mmol/L   Chloride 103 96 - 106 mmol/L   CO2 18 (L) 20 - 29 mmol/L   Calcium 10.1 8.7 - 10.2 mg/dL   Total Protein 8.1 6.0 - 8.5 g/dL   Albumin 5.1 3.5 - 5.5 g/dL   Globulin, Total 3.0 1.5 - 4.5 g/dL   Albumin/Globulin Ratio 1.7 1.2 - 2.2   Bilirubin Total 0.3 0.0 - 1.2 mg/dL   Alkaline Phosphatase 84 39 - 117 IU/L   AST 17 0 - 40 IU/L   ALT 28 0 - 32 IU/L  CBC with Differential/Platelet  Result Value Ref Range   WBC 8.0 3.4 - 10.8 x10E3/uL   RBC 4.66 3.77 - 5.28 x10E6/uL   Hemoglobin 15.4 11.1 - 15.9 g/dL   Hematocrit 46.1 34.0 - 46.6 %   MCV 99 (H) 79 - 97 fL   MCH 33.0 26.6 - 33.0 pg   MCHC 33.4 31.5 - 35.7 g/dL   RDW 12.7 12.3 - 15.4 %   Platelets 339 150 - 450 x10E3/uL   Neutrophils 72 Not Estab. %   Lymphs 23 Not Estab. %   Monocytes 4 Not Estab. %   Eos 1 Not Estab. %   Basos 0 Not Estab. %   Neutrophils Absolute 5.7 1.4 - 7.0 x10E3/uL   Lymphocytes Absolute 1.8 0.7 - 3.1 x10E3/uL   Monocytes Absolute 0.4 0.1 - 0.9 x10E3/uL   EOS (ABSOLUTE) 0.1 0.0 - 0.4 x10E3/uL   Basophils Absolute 0.0 0.0 - 0.2 x10E3/uL   Immature Granulocytes 0 Not Estab. %   Immature Grans (Abs) 0.0 0.0 - 0.1 x10E3/uL  VITAMIN D 25 Hydroxy (Vit-D Deficiency, Fractures)  Result Value Ref Range   Vit D, 25-Hydroxy 27.9 (L) 30.0 - 100.0 ng/mL      Assessment & Plan:   Problem List Items Addressed This Visit    None    Visit Diagnoses    Acute right-sided thoracic back pain    -  Primary   In mild exacerbation. She has somatic dysfunction that is contributing to her symptoms. Treated today with good results as below. Call with concerns.    Flu vaccine need       Flu shot given today.   Relevant Orders   Flu Vaccine QUAD 36+ mos IM (Completed)   Somatic dysfunction of sacral region       Somatic dysfunction of pelvis region       Somatic dysfunction of rib  region       Thoracic segment  dysfunction       Somatic dysfunction of lumbar region         After verbal consent was obtained, patient was treated today with osteopathic manipulative medicine to the regions of the thorax, ribs, lumbar, pelvis and sacrum using the techniques of myofascial release, counterstrain, muscle energy, HVLA and soft tissue. Areas of compensation relating to her primary pain source also treated. Patient tolerated the procedure well with good objective and good subjective improvement in symptoms. She left the room in good condition. She was advised to stay well hydrated and that she may have some soreness following the procedure. If not improving or worsening, she will call and come in. She will return for reevaluation  on a PRN basis.   Follow up plan: Return if symptoms worsen or fail to improve.

## 2018-08-02 ENCOUNTER — Encounter: Payer: Self-pay | Admitting: Family Medicine

## 2018-08-17 DIAGNOSIS — N644 Mastodynia: Secondary | ICD-10-CM | POA: Diagnosis not present

## 2018-08-31 ENCOUNTER — Encounter: Payer: BLUE CROSS/BLUE SHIELD | Admitting: Family Medicine

## 2018-09-10 ENCOUNTER — Inpatient Hospital Stay: Payer: BLUE CROSS/BLUE SHIELD | Attending: Family | Admitting: Family

## 2018-09-10 ENCOUNTER — Inpatient Hospital Stay: Payer: BLUE CROSS/BLUE SHIELD

## 2018-09-10 ENCOUNTER — Other Ambulatory Visit: Payer: Self-pay

## 2018-09-10 DIAGNOSIS — Z79899 Other long term (current) drug therapy: Secondary | ICD-10-CM | POA: Insufficient documentation

## 2018-09-10 DIAGNOSIS — D500 Iron deficiency anemia secondary to blood loss (chronic): Secondary | ICD-10-CM | POA: Insufficient documentation

## 2018-09-10 DIAGNOSIS — N921 Excessive and frequent menstruation with irregular cycle: Secondary | ICD-10-CM | POA: Diagnosis not present

## 2018-09-10 LAB — CBC WITH DIFFERENTIAL (CANCER CENTER ONLY)
Abs Immature Granulocytes: 0.02 10*3/uL (ref 0.00–0.07)
Basophils Absolute: 0 10*3/uL (ref 0.0–0.1)
Basophils Relative: 0 %
Eosinophils Absolute: 0.1 10*3/uL (ref 0.0–0.5)
Eosinophils Relative: 1 %
HCT: 43.1 % (ref 36.0–46.0)
Hemoglobin: 14.7 g/dL (ref 12.0–15.0)
Immature Granulocytes: 0 %
Lymphocytes Relative: 21 %
Lymphs Abs: 1.9 10*3/uL (ref 0.7–4.0)
MCH: 32.7 pg (ref 26.0–34.0)
MCHC: 34.1 g/dL (ref 30.0–36.0)
MCV: 96 fL (ref 80.0–100.0)
Monocytes Absolute: 0.6 10*3/uL (ref 0.1–1.0)
Monocytes Relative: 7 %
Neutro Abs: 6.3 10*3/uL (ref 1.7–7.7)
Neutrophils Relative %: 71 %
Platelet Count: 371 10*3/uL (ref 150–400)
RBC: 4.49 MIL/uL (ref 3.87–5.11)
RDW: 11.5 % (ref 11.5–15.5)
WBC Count: 8.9 10*3/uL (ref 4.0–10.5)
nRBC: 0 % (ref 0.0–0.2)

## 2018-09-10 LAB — PHOSPHORUS: Phosphorus: 3.1 mg/dL (ref 2.5–4.6)

## 2018-09-10 NOTE — Progress Notes (Signed)
Hematology and Oncology Follow Up Visit  Karla BeetsRebekah Huber [id number] 1985-11-03 32 y.o. 09/10/2018   Principle Diagnosis: Iron deficiency anemia  Current Therapy:   IV iron (Injectafer) as indicated - last received in July 2019 x 2   Interim History:  Ms. Karla Huber is a 32 y/o caucasian female that is here for a follow for iron deficiency anemia. She is experiencing fatigue and an increase in the frequency of her headaches. She also has had 2 ocular migraines in the past few months. They only last 30 minutes and subside on their own. She saw her eye doctor and had an exam in March and everything was normal.  She is currently on oral birth control and has not had a period since March. She denies any numbness or tingling in her hands or feet. Denies any vision changes. Denies any nausea, vomiting, or diarrhea. Denies any SOB,C/P, or edema.  She reports the infectafer makes her nauseous and feel sick for about 2-3 days afterwards and only increases her energy for about a month before she slowly becomes fatigued again.  No lymphadenopathy noted on exam.  She has a good appetite and she is staying well hydrated.   ECOG Performance Status: 1 - Symptomatic but completely ambulatory  Medications:  Allergies as of 09/10/2018      Reactions   Amoxicillin Hives   Has patient had a PCN reaction causing immediate rash, facial/tongue/throat swelling, SOB or lightheadedness with hypotension: {no Has patient had a PCN reaction causing severe rash involving mucus membranes or skin necrosis: {no Has patient had a PCN reaction that required hospitalization no Has patient had a PCN reaction occurring within the last 10 years: {yes If all of the above answers are "NO", then may proceed with Cephalosporin use.   Hydrocodone Hives   Tdap [tetanus-diphth-acell Pertussis] Other (See Comments)   Fever, Joint stiffness      Medication List        Accurate as of 09/10/18 12:29 PM. Always use your most  recent med list.          albuterol 108 (90 Base) MCG/ACT inhaler Commonly known as:  PROVENTIL HFA;VENTOLIN HFA Use 2 puffs every four hours as needed for cough or wheeze.  May use 2 puffs 10-20 minutes prior to exercise.   fluticasone 44 MCG/ACT inhaler Commonly known as:  FLOVENT HFA Inhale 2 puffs into the lungs 2 (two) times daily.   Levonorgestrel-Ethinyl Estradiol 0.1-0.02 & 0.01 MG tablet Commonly known as:  AMETHIA,CAMRESE Take 1 tablet by mouth daily.   montelukast 10 MG tablet Commonly known as:  SINGULAIR Take 1 tablet (10 mg total) by mouth at bedtime.   omeprazole 20 MG tablet Commonly known as:  PRILOSEC OTC Take 20 mg by mouth daily.   PRENATAL VITAMIN PO Take by mouth.   RHINOCORT ALLERGY 32 MCG/ACT nasal spray Generic drug:  budesonide Place 2 sprays into both nostrils daily.       Allergies:  Allergies  Allergen Reactions  . Amoxicillin Hives    Has patient had a PCN reaction causing immediate rash, facial/tongue/throat swelling, SOB or lightheadedness with hypotension: {no Has patient had a PCN reaction causing severe rash involving mucus membranes or skin necrosis: {no Has patient had a PCN reaction that required hospitalization no Has patient had a PCN reaction occurring within the last 10 years: {yes If all of the above answers are "NO", then may proceed with Cephalosporin use.  Marland Kitchen. Hydrocodone Hives  . Tdap [Tetanus-Diphth-Acell Pertussis] Other (See  Comments)    Fever, Joint stiffness    Past Medical History, Surgical history, Social history, and Family History were reviewed and updated.  Review of Systems: All other 10 point review of systems is negative.   Physical Exam:  weight is 186 lb (84.4 kg). Her oral temperature is 98.6 F (37 C). Her blood pressure is 124/68 and her pulse is 84. Her respiration is 18 and oxygen saturation is 99%.   Wt Readings from Last 3 Encounters:  09/10/18 186 lb (84.4 kg)  07/31/18 187 lb (84.8 kg)    06/26/18 186 lb 3.2 oz (84.5 kg)    Ocular: Sclerae unicteric, pupils equal, round and reactive to light Ear-nose-throat: Oropharynx clear, dentition fair Lymphatic: No cervical or supraclavicular adenopathy Lungs no rales or rhonchi, good excursion bilaterally Heart regular rate and rhythm, no murmur appreciated Abd soft, nontender, positive bowel sounds MSK no focal spinal tenderness, no joint edema Neuro: non-focal, well-oriented, appropriate affect Breasts: deferred   Lab Results  Component Value Date   WBC 8.9 09/10/2018   HGB 14.7 09/10/2018   HCT 43.1 09/10/2018   MCV 96.0 09/10/2018   PLT 371 09/10/2018   Lab Results  Component Value Date   FERRITIN 140 05/06/2018   IRON 72 05/06/2018   TIBC 424 05/06/2018   UIBC 352 05/06/2018   IRONPCTSAT 17 (L) 05/06/2018   Lab Results  Component Value Date   RETICCTPCT 1.4 12/08/2017   RBC 4.49 09/10/2018   No results found for: KPAFRELGTCHN, LAMBDASER, KAPLAMBRATIO No results found for: IGGSERUM, IGA, IGMSERUM No results found for: Marda Stalker, SPEI   Chemistry      Component Value Date/Time   NA 139 06/26/2018 1523   K 4.5 06/26/2018 1523   CL 103 06/26/2018 1523   CO2 18 (L) 06/26/2018 1523   BUN 9 06/26/2018 1523   CREATININE 0.72 06/26/2018 1523   CREATININE 0.80 12/08/2017 1003      Component Value Date/Time   CALCIUM 10.1 06/26/2018 1523   ALKPHOS 84 06/26/2018 1523   AST 17 06/26/2018 1523   AST 20 12/08/2017 1003   ALT 28 06/26/2018 1523   ALT 18 12/08/2017 1003   BILITOT 0.3 06/26/2018 1523   BILITOT 0.5 12/08/2017 1003       Impression and Plan: Karla Huber is a very pleasant 32 y/o caucasian female with a history of iron deficiency anemia due to heavy cycles. She is responding to IV iron and has had no problems. She is also on birth control and has not had a period since March which has helped increased her hemoglobin.  We will see what her  iron studies show and bring her in for an infusion if needed. We will switch her to [facility] over [facility] due to the nausea she experienced with the other two infusions.  We will go ahead and plan to see her back in 4 months for a follow-up. She will contact our office with any questions or concerns. We can certainly see her sooner if needed.   Emeline Gins, NP/[place], [place] 11/14/201912:29 PM

## 2018-09-11 LAB — IRON AND TIBC
Iron: 139 ug/dL (ref 41–142)
Saturation Ratios: 33 % (ref 21–57)
TIBC: 416 ug/dL (ref 236–444)
UIBC: 277 ug/dL (ref 120–384)

## 2018-09-11 LAB — FERRITIN: Ferritin: 415 ng/mL — ABNORMAL HIGH (ref 11–307)

## 2018-09-15 ENCOUNTER — Other Ambulatory Visit: Payer: Self-pay | Admitting: Family Medicine

## 2018-09-16 ENCOUNTER — Other Ambulatory Visit: Payer: Self-pay | Admitting: Family Medicine

## 2018-09-16 NOTE — Telephone Encounter (Signed)
Pharmacy is requesting Rx changes- 90 days supply. Patient has appointment tomorrow.

## 2018-09-17 ENCOUNTER — Encounter: Payer: Self-pay | Admitting: Family Medicine

## 2018-09-17 ENCOUNTER — Ambulatory Visit (INDEPENDENT_AMBULATORY_CARE_PROVIDER_SITE_OTHER): Payer: BLUE CROSS/BLUE SHIELD | Admitting: Family Medicine

## 2018-09-17 ENCOUNTER — Other Ambulatory Visit: Payer: Self-pay

## 2018-09-17 VITALS — BP 115/75 | HR 84 | Temp 98.9°F | Ht 64.2 in | Wt 186.0 lb

## 2018-09-17 DIAGNOSIS — J069 Acute upper respiratory infection, unspecified: Secondary | ICD-10-CM

## 2018-09-17 DIAGNOSIS — L719 Rosacea, unspecified: Secondary | ICD-10-CM | POA: Diagnosis not present

## 2018-09-17 DIAGNOSIS — Z0000 Encounter for general adult medical examination without abnormal findings: Secondary | ICD-10-CM | POA: Diagnosis not present

## 2018-09-17 LAB — UA/M W/RFLX CULTURE, ROUTINE
Bilirubin, UA: NEGATIVE
Glucose, UA: NEGATIVE
Ketones, UA: NEGATIVE
Leukocytes, UA: NEGATIVE
Nitrite, UA: NEGATIVE
Protein, UA: NEGATIVE
RBC, UA: NEGATIVE
Specific Gravity, UA: <1.005 — ABNORMAL LOW (ref 1.005–1.030)
Urobilinogen, Ur: 0.2 mg/dL (ref 0.2–1.0)
pH, UA: 7 (ref 5.0–7.5)

## 2018-09-17 MED ORDER — METRONIDAZOLE 1 % EX GEL: Freq: Every day | CUTANEOUS | 6 refills | Status: DC

## 2018-09-17 MED ORDER — PREDNISONE 50 MG PO TABS: 50.0000 mg | ORAL_TABLET | Freq: Every day | ORAL | 0 refills | Status: DC

## 2018-09-17 MED ORDER — MONTELUKAST SODIUM 10 MG PO TABS: ORAL_TABLET | ORAL | 3 refills | Status: DC

## 2018-09-17 NOTE — Patient Instructions (Signed)

## 2018-09-17 NOTE — Assessment & Plan Note (Signed)
Will treat with metrogel. Call with any concerns.  

## 2018-09-17 NOTE — Progress Notes (Signed)
BP 115/75   Pulse 84   Temp 98.9 F (37.2 C) (Oral)   Ht 5' 4.2" (1.631 m)   Wt 186 lb (84.4 kg)   SpO2 97%   BMI 31.73 kg/m    Subjective:    Patient ID: Karla Huber, female    DOB: [birth year], 32 y.o.   MRN: [id number]  HPI: Karla Huber is a 32 y.o. female presenting on 09/17/2018 for comprehensive medical examination. Current medical complaints include:  UPPER RESPIRATORY TRACT INFECTION Duration: 5 days Worst symptom: facial pain Fever: no Cough: no Shortness of breath: no Wheezing: no Chest pain: no Chest tightness: no Chest congestion: no Nasal congestion: yes Runny nose: no Post nasal drip: yes Sneezing: no Sore throat: no Swollen glands: no Sinus pressure: yes Headache: yes Face pain: yes Toothache: no Ear pain: yes "right Ear pressure: yes "right Eyes red/itching:no Eye drainage/crusting: no  Vomiting: no Rash: no Fatigue: yes Sick contacts: yes Strep contacts: no  Context: stable Recurrent sinusitis: no Relief with OTC cold/cough medications: no  Treatments attempted: none   She currently lives with: husband and kids Menopausal Symptoms: no  Depression Screen done today and results listed below:  Depression screen [facility] 2/9 09/17/2018 08/29/2017 08/27/2016  Decreased Interest 0 0 0  Down, Depressed, Hopeless 0 0 0  PHQ - 2 Score 0 0 0  Altered sleeping 0 - -  Tired, decreased energy 2 - -  Change in appetite 0 - -  Feeling bad or failure about yourself  0 - -  Trouble concentrating 1 - -  Moving slowly or fidgety/restless 0 - -  Suicidal thoughts 0 - -  PHQ-9 Score 3 - -  Difficult doing work/chores Not difficult at all - -   GAD 7 : Generalized Anxiety Score 09/17/2018  Nervous, Anxious, on Edge 0  Control/stop worrying 0  Worry too much - different things 0  Trouble relaxing 0  Restless 0  Easily annoyed or irritable 2  Afraid - awful might happen 0  Total GAD 7 Score 2  Anxiety Difficulty Somewhat difficult    Functional Status Survey: Is the patient deaf or have difficulty hearing?: No Does the patient have difficulty seeing, even when wearing glasses/contacts?: No Does the patient have difficulty concentrating, remembering, or making decisions?: No Does the patient have difficulty walking or climbing stairs?: No Does the patient have difficulty dressing or bathing?: No Does the patient have difficulty doing errands alone such as visiting a doctor's office or shopping?: No  Past Medical History:  Past Medical History:  Diagnosis Date  . Acute blood loss anemia 11/13/2016  . Asthma    pulmocort daily  . GERD (gastroesophageal reflux disease)   . Gestational diabetes    diet controlled  . Headache   . Hx of varicella   . Hypertension    pre-eclampsia  . Iron deficiency anemia of pregnancy 11/13/2016  . 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 Menometrorrhagia 12/08/2017  . Postpartum care following cesarean delivery (4/30) 02/25/2015  . Postpartum care following repeat cesarean delivery (1/16) 11/13/2016  . Preeclampsia   . Traumatic injury during pregnancy in third trimester 02/09/2015    Surgical History:  Past Surgical History:  Procedure Laterality Date  . ADENOIDECTOMY    . CESAREAN SECTION N/A 02/25/2015   Procedure: CESAREAN SECTION;  Surgeon: Shea Evans, MD;  Location: [facility];  Service: Obstetrics;  Laterality: N/A;  . CESAREAN SECTION N/A 11/12/2016   Procedure: Repeat CESAREAN SECTION;  Surgeon: Olivia Mackie,  MD;  Location: [facility];  Service: Obstetrics;  Laterality: N/A;  . FOOT SURGERY    . KNEE SURGERY    . TONSILLECTOMY      Medications:  Current Outpatient Medications on File Prior to Visit  Medication Sig  . albuterol (PROVENTIL HFA;VENTOLIN HFA) 108 (90 Base) MCG/ACT inhaler Use 2 puffs every four hours as needed for cough or wheeze.  May use 2 puffs 10-20 minutes prior to exercise.  . budesonide (RHINOCORT ALLERGY) 32 MCG/ACT nasal spray Place 2 sprays  into both nostrils daily.  Marland Kitchen FLOVENT HFA 44 MCG/ACT inhaler TAKE 2 PUFFS BY MOUTH TWICE A DAY  . Levonorgestrel-Ethinyl Estradiol (AMETHIA,CAMRESE) 0.1-0.02 & 0.01 MG tablet Take 1 tablet by mouth daily.  Marland Kitchen omeprazole (PRILOSEC OTC) 20 MG tablet Take 20 mg by mouth daily.  . Prenatal Vit-Fe Fumarate-FA (PRENATAL VITAMIN PO) Take by mouth.   No current facility-administered medications on file prior to visit.     Allergies:  Allergies  Allergen Reactions  . Amoxicillin Hives    Has patient had a PCN reaction causing immediate rash, facial/tongue/throat swelling, SOB or lightheadedness with hypotension: {no Has patient had a PCN reaction causing severe rash involving mucus membranes or skin necrosis: {no Has patient had a PCN reaction that required hospitalization no Has patient had a PCN reaction occurring within the last 10 years: {yes If all of the above answers are "NO", then may proceed with Cephalosporin use.  Marland Kitchen Hydrocodone Hives  . Tdap [Tetanus-Diphth-Acell Pertussis] Other (See Comments)    Fever, Joint stiffness    Social History:  Social History   Socioeconomic History  . Marital status: Married    Spouse name: Not on file  . Number of children: Not on file  . Years of education: Not on file  . Highest education level: Not on file  Occupational History  . Occupation: umemployed  Social Needs  . Financial resource strain: Not on file  . Food insecurity:    Worry: Not on file    Inability: Not on file  . Transportation needs:    Medical: Not on file    Non-medical: Not on file  Tobacco Use  . Smoking status: Never Smoker  . Smokeless tobacco: Never Used  Substance and Sexual Activity  . Alcohol use: No  . Drug use: No  . Sexual activity: Yes    Birth control/protection: None  Lifestyle  . Physical activity:    Days per week: Not on file    Minutes per session: Not on file  . Stress: Not on file  Relationships  . Social connections:    Talks on phone:  Not on file    Gets together: Not on file    Attends religious service: Not on file    Active member of club or organization: Not on file    Attends meetings of clubs or organizations: Not on file    Relationship status: Not on file  . Intimate partner violence:    Fear of current or ex partner: Not on file    Emotionally abused: Not on file    Physically abused: Not on file    Forced sexual activity: Not on file  Other Topics Concern  . Not on file  Social History Narrative   Exercises try 3-5 times a week for 30-43min   Social History   Tobacco Use  Smoking Status Never Smoker  Smokeless Tobacco Never Used   Social History   Substance and Sexual Activity  Alcohol Use No    Family History:  Family History  Problem Relation Age of Onset  . Heart disease Mother   . Mitral valve prolapse Father   . Asthma Sister   . Hypertension Maternal Grandmother   . Cancer Cousin        AML    Past medical history, surgical history, medications, allergies, family history and social history reviewed with patient today and changes made to appropriate areas of the chart.   Review of Systems  Constitutional: Positive for malaise/fatigue. Negative for chills, diaphoresis, fever and weight loss.  HENT: Positive for congestion, ear pain and sinus pain. Negative for ear discharge, hearing loss, nosebleeds, sore throat and tinnitus.   Eyes: Negative.   Respiratory: Negative.  Negative for stridor.   Cardiovascular: Negative.   Gastrointestinal: Positive for heartburn. Negative for abdominal pain, blood in stool, constipation, diarrhea, melena, nausea and vomiting.  Genitourinary: Negative.   Musculoskeletal: Positive for back pain, myalgias and neck pain. Negative for falls and joint pain.  Skin: Positive for rash. Negative for itching.  Neurological: Negative.   Endo/Heme/Allergies: Positive for environmental allergies. Negative for polydipsia. Does not bruise/bleed easily.    Psychiatric/Behavioral: Negative.     All other ROS negative except what is listed above and in the HPI.      Objective:    BP 115/75   Pulse 84   Temp 98.9 F (37.2 C) (Oral)   Ht 5' 4.2" (1.631 m)   Wt 186 lb (84.4 kg)   SpO2 97%   BMI 31.73 kg/m   Wt Readings from Last 3 Encounters:  09/17/18 186 lb (84.4 kg)  09/10/18 186 lb (84.4 kg)  07/31/18 187 lb (84.8 kg)    Physical Exam  Constitutional: She is oriented to person, place, and time. She appears well-developed and well-nourished. No distress.  HENT:  Head: Normocephalic and atraumatic.  Right Ear: Hearing, tympanic membrane, external ear and ear canal normal.  Left Ear: Hearing, tympanic membrane, external ear and ear canal normal.  Nose: Nose normal.  Mouth/Throat: Uvula is midline, oropharynx is clear and moist and mucous membranes are normal. No oropharyngeal exudate.  Eyes: Pupils are equal, round, and reactive to light. Conjunctivae, EOM and lids are normal. Right eye exhibits no discharge. Left eye exhibits no discharge. No scleral icterus.  Neck: Normal range of motion. Neck supple. No JVD present. No tracheal deviation present. No thyromegaly present.  Cardiovascular: Normal rate, regular rhythm, normal heart sounds and intact distal pulses. Exam reveals no gallop and no friction rub.  No murmur heard. Pulmonary/Chest: Effort normal and breath sounds normal. No stridor. No respiratory distress. She has no wheezes. She has no rales. She exhibits no tenderness.  Abdominal: Soft. Bowel sounds are normal. She exhibits no distension and no mass. There is no tenderness. There is no rebound and no guarding. No hernia.  Genitourinary:  Genitourinary Comments: Breast and pelvic exams deferred- done at GYN  Musculoskeletal: She exhibits no edema, tenderness or deformity.  Lymphadenopathy:    She has no cervical adenopathy.  Neurological: She is alert and oriented to person, place, and time. She displays normal  reflexes. No cranial nerve deficit or sensory deficit. She exhibits normal muscle tone. Coordination normal.  Skin: Skin is warm, dry and intact. Capillary refill takes less than 2 seconds. No rash noted. She is not diaphoretic. No erythema. No pallor.  Red pustular rash along cheeks bilaterally   Psychiatric: She has a normal mood and affect. Her speech  is normal and behavior is normal. Judgment and thought content normal. Cognition and memory are normal.  Nursing note and vitals reviewed.   Results for orders placed or performed in visit on 09/17/18  HM PAP SMEAR  Result Value Ref Range   HM Pap smear Patient Reported       Assessment & Plan:   Problem List Items Addressed This Visit      Musculoskeletal and Integument   Rosacea    Will treat with metrogel. Call with any concerns.        Other Visit Diagnoses    Routine general medical examination at a health care facility    -  Primary   Vaccines up to date. Screening labs checked today. Pap up to date. Continue diet and exercise. Call with any concerns.    Relevant Orders   CBC with Differential/Platelet   Comprehensive metabolic panel   Lipid Panel w/o Chol/HDL Ratio   TSH   UA/M w/rflx Culture, Routine   Upper respiratory tract infection, unspecified type       Will treat with prednisone. Call with any concerns. Continue to monitor.        Follow up plan: Return if symptoms worsen or fail to improve.   LABORATORY TESTING:  - Pap smear: up to date  IMMUNIZATIONS:   - Tdap: Tetanus vaccination status reviewed: Allergic to TDAP. - Influenza: Up to date  PATIENT COUNSELING:   Advised to take 1 mg of folate supplement per day if capable of pregnancy.   Sexuality: Discussed sexually transmitted diseases, partner selection, use of condoms, avoidance of unintended pregnancy  and contraceptive alternatives.   Advised to avoid cigarette smoking.  I discussed with the patient that most people either abstain from  alcohol or drink within safe limits (<=14/week and <=4 drinks/occasion for males, <=7/weeks and <= 3 drinks/occasion for females) and that the risk for alcohol disorders and other health effects rises proportionally with the number of drinks per week and how often a drinker exceeds daily limits.  Discussed cessation/primary prevention of drug use and availability of treatment for abuse.   Diet: Encouraged to adjust caloric intake to maintain  or achieve ideal body weight, to reduce intake of dietary saturated fat and total fat, to limit sodium intake by avoiding high sodium foods and not adding table salt, and to maintain adequate dietary potassium and calcium preferably from fresh fruits, vegetables, and low-fat dairy products.    stressed the importance of regular exercise  Injury prevention: Discussed safety belts, safety helmets, smoke detector, smoking near bedding or upholstery.   Dental health: Discussed importance of regular tooth brushing, flossing, and dental visits.    NEXT PREVENTATIVE PHYSICAL DUE IN 1 YEAR. Return if symptoms worsen or fail to improve.

## 2018-09-18 LAB — COMPREHENSIVE METABOLIC PANEL
ALT: 47 IU/L — ABNORMAL HIGH (ref 0–32)
AST: 20 IU/L (ref 0–40)
Albumin/Globulin Ratio: 1.8 (ref 1.2–2.2)
Albumin: 4.6 g/dL (ref 3.5–5.5)
Alkaline Phosphatase: 72 IU/L (ref 39–117)
BUN/Creatinine Ratio: 13 (ref 9–23)
BUN: 10 mg/dL (ref 6–20)
Bilirubin Total: 0.4 mg/dL (ref 0.0–1.2)
CO2: 20 mmol/L (ref 20–29)
Calcium: 9.7 mg/dL (ref 8.7–10.2)
Chloride: 105 mmol/L (ref 96–106)
Creatinine, Ser: 0.75 mg/dL (ref 0.57–1.00)
GFR calc Af Amer: 121 mL/min/{1.73_m2} (ref >59)
GFR calc non Af Amer: 105 mL/min/{1.73_m2} (ref >59)
Globulin, Total: 2.5 g/dL (ref 1.5–4.5)
Glucose: 102 mg/dL — ABNORMAL HIGH (ref 65–99)
Potassium: 4.4 mmol/L (ref 3.5–5.2)
Sodium: 140 mmol/L (ref 134–144)
Total Protein: 7.1 g/dL (ref 6.0–8.5)

## 2018-09-18 LAB — CBC WITH DIFFERENTIAL/PLATELET
Basophils Absolute: 0 10*3/uL (ref 0.0–0.2)
Basos: 1 %
EOS (ABSOLUTE): 0.2 10*3/uL (ref 0.0–0.4)
Eos: 3 %
Hematocrit: 42.8 % (ref 34.0–46.6)
Hemoglobin: 14.6 g/dL (ref 11.1–15.9)
Immature Grans (Abs): 0 10*3/uL (ref 0.0–0.1)
Immature Granulocytes: 0 %
Lymphocytes Absolute: 1.7 10*3/uL (ref 0.7–3.1)
Lymphs: 24 %
MCH: 32.7 pg (ref 26.6–33.0)
MCHC: 34.1 g/dL (ref 31.5–35.7)
MCV: 96 fL (ref 79–97)
Monocytes Absolute: 0.4 10*3/uL (ref 0.1–0.9)
Monocytes: 6 %
Neutrophils Absolute: 4.8 10*3/uL (ref 1.4–7.0)
Neutrophils: 66 %
Platelets: 351 10*3/uL (ref 150–450)
RBC: 4.46 x10E6/uL (ref 3.77–5.28)
RDW: 11.7 % — ABNORMAL LOW (ref 12.3–15.4)
WBC: 7.2 10*3/uL (ref 3.4–10.8)

## 2018-09-18 LAB — TSH: TSH: 1.26 u[IU]/mL (ref 0.450–4.500)

## 2018-09-18 LAB — LIPID PANEL W/O CHOL/HDL RATIO
Cholesterol, Total: 181 mg/dL (ref 100–199)
HDL: 51 mg/dL (ref >39)
LDL Calculated: 105 mg/dL — ABNORMAL HIGH (ref 0–99)
Triglycerides: 124 mg/dL (ref 0–149)
VLDL Cholesterol Cal: 25 mg/dL (ref 5–40)

## 2018-09-26 IMAGING — US US EXTREM LOW VENOUS*R*
1 series · 14 of 24 positions shown · non-contrast
Comparison: None

CLINICAL DATA: Pain x7 weeks

EXAM:
RIGHT LOWER EXTREMITY VENOUS DOPPLER ULTRASOUND
TECHNIQUE: Gray-scale sonography with compression, as well as color and duplex
ultrasound, were performed to evaluate the deep venous system from
the level of the common femoral vein through the popliteal and
proximal calf veins.

[Series 1: us extrem low venous*right* · 0.10mm/px · 14 of 34 slices shown]
[im 1/34]
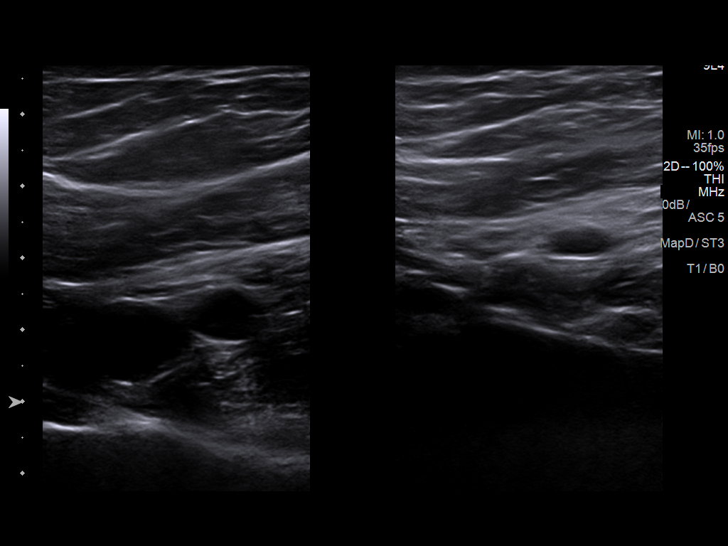
[im 3/34]
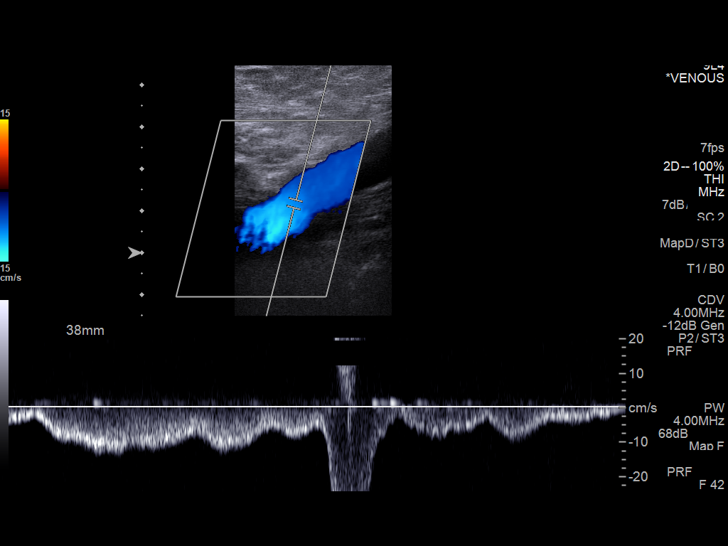
[im 6/34]
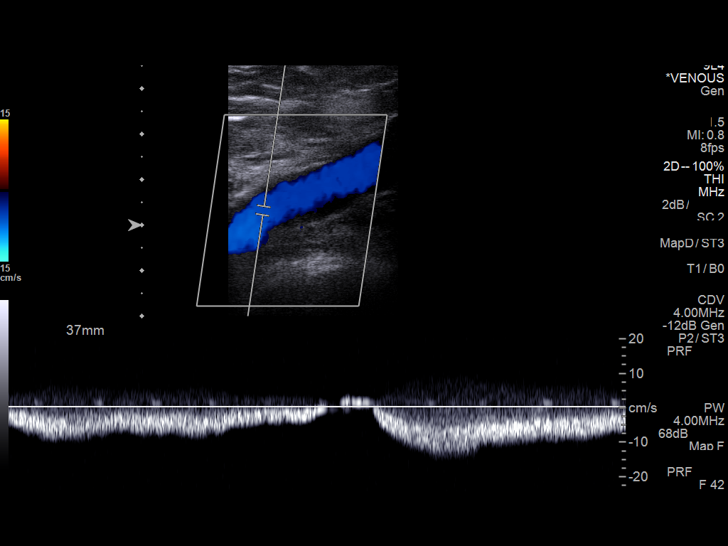
[im 9/34]
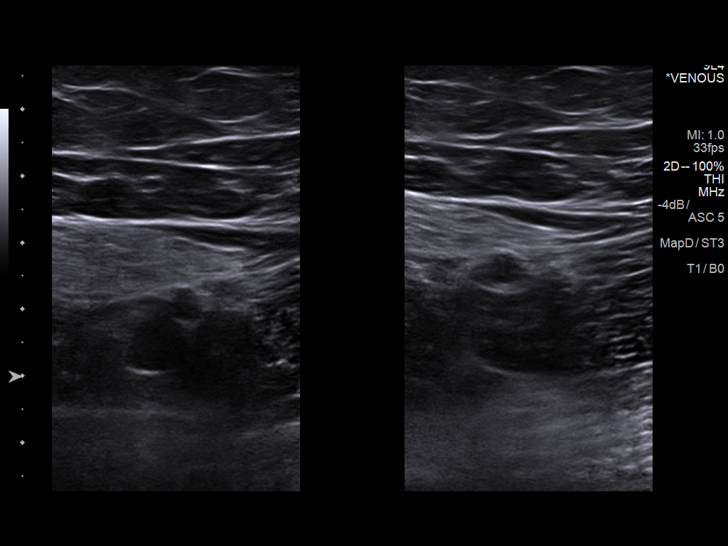
[im 11/34]
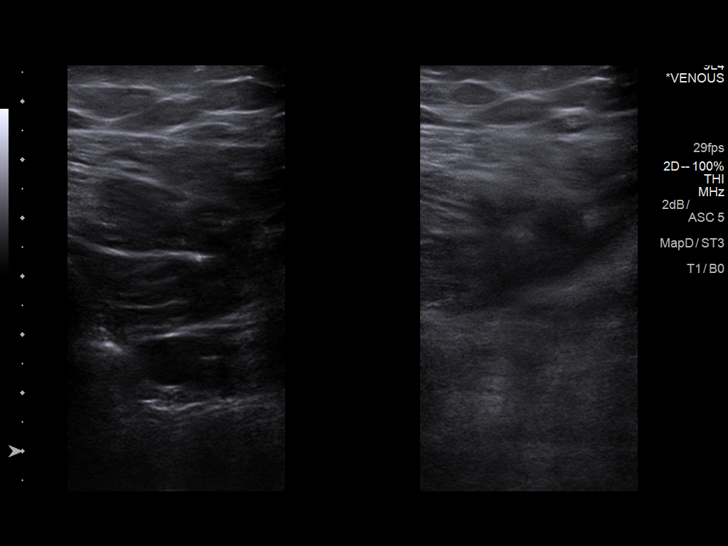
[im 13/34]
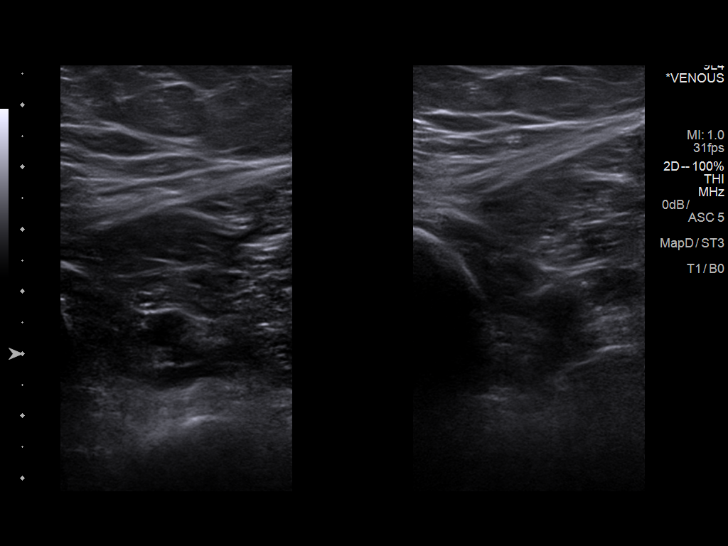
[im 16/34]
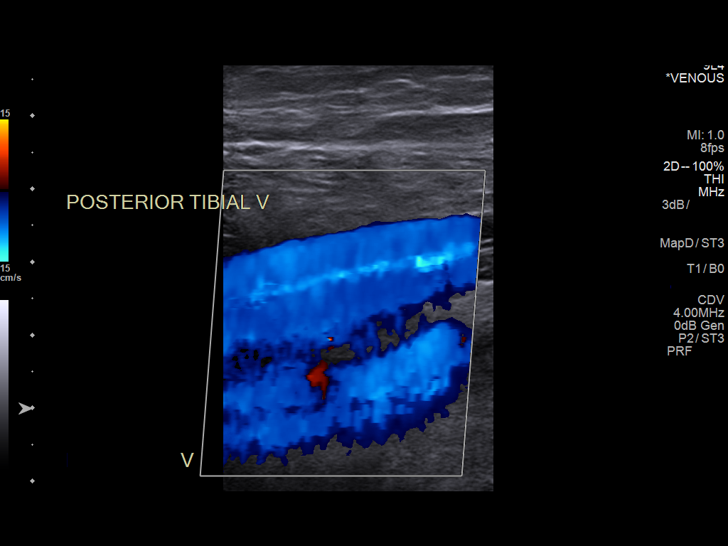
[im 18/34]
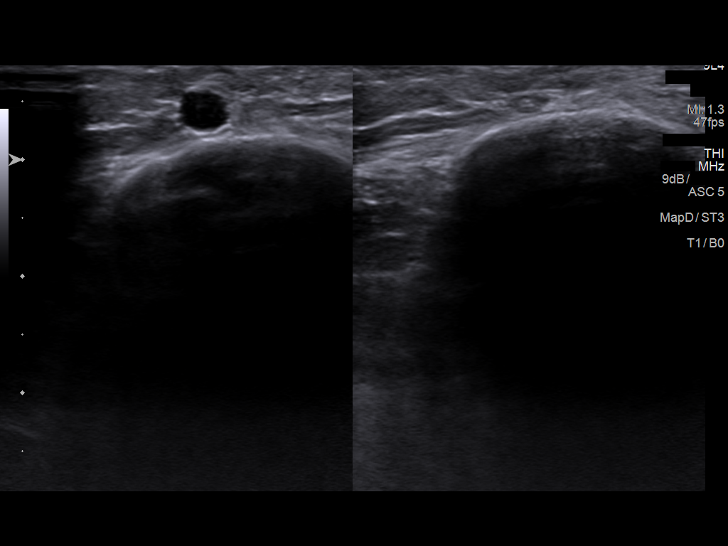
[im 21/34]
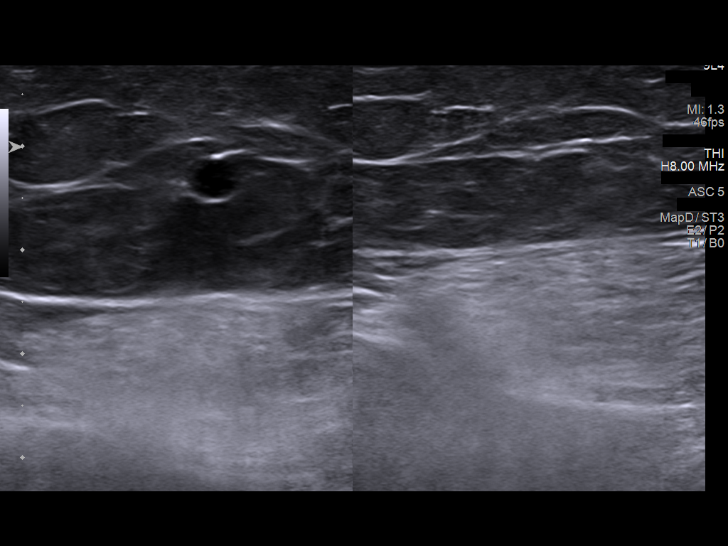
[im 23/34]
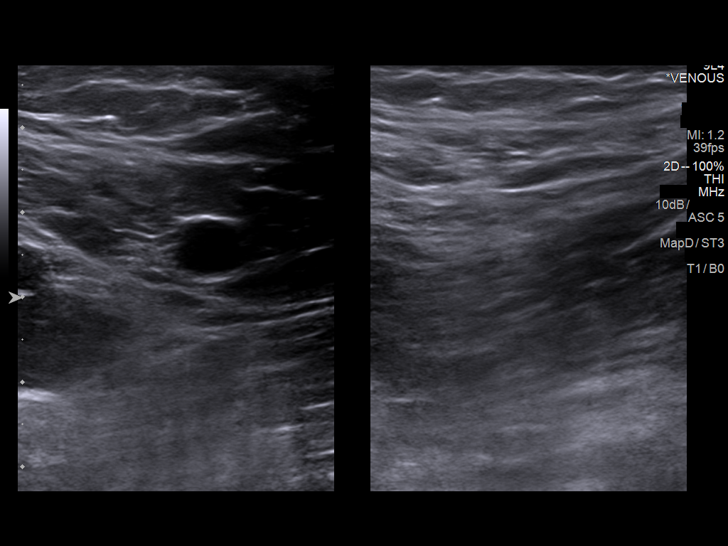
[im 26/34]
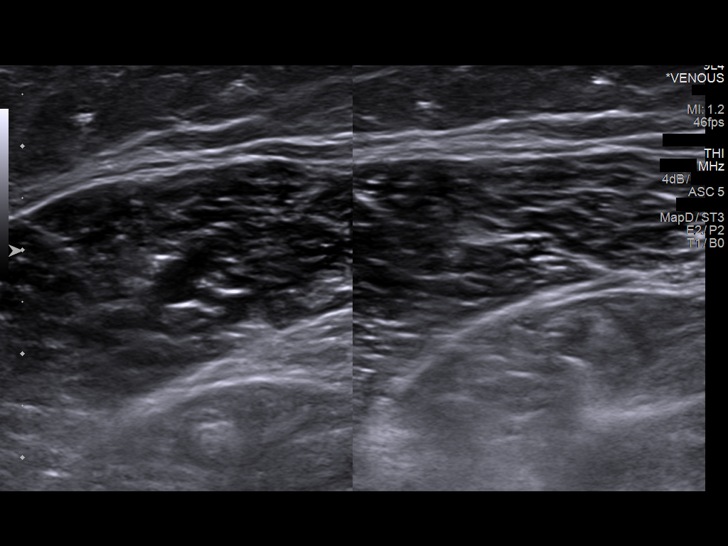
[im 28/34]
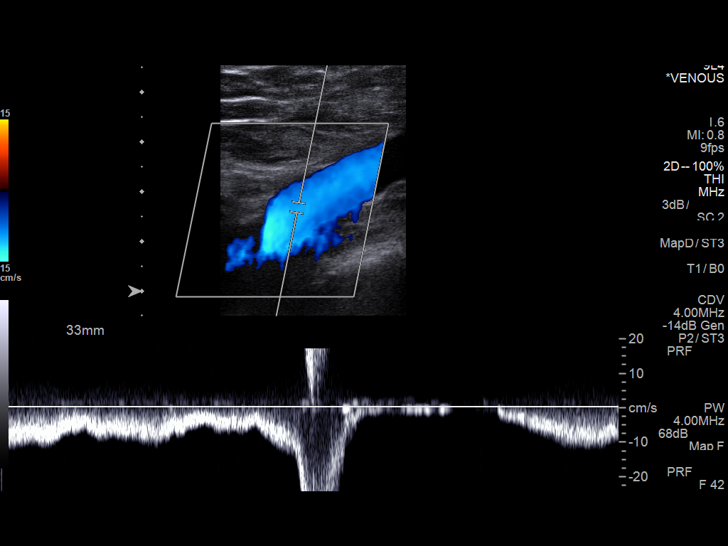
[im 31/34]
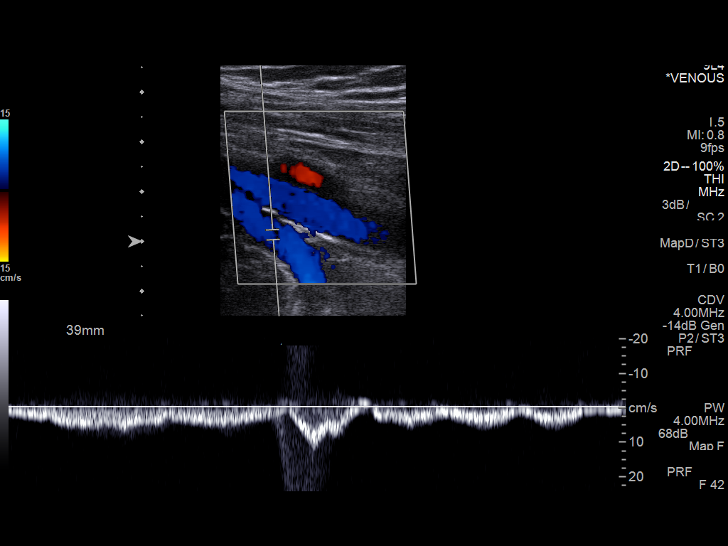
[im 34/34]
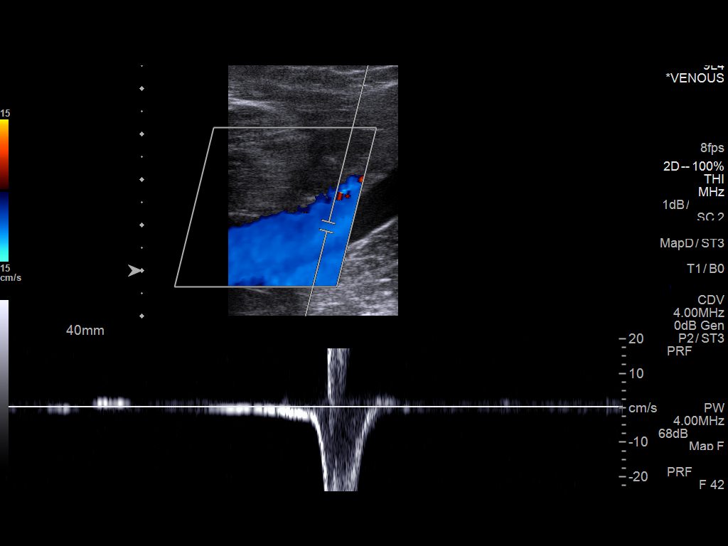

[14 of 24 positions shown; findings below may reference images not displayed]

FINDINGS: Normal compressibility of the common femoral, superficial femoral,
and popliteal veins, as well as the proximal calf veins. No filling
defects to suggest DVT on grayscale or color Doppler imaging.
Doppler waveforms show normal direction of venous flow, normal
respiratory phasicity and response to augmentation. Visualized
segments of the saphenous venous system normal in caliber and
compressibility. Survey views of the contralateral common femoral
vein are unremarkable.
IMPRESSION: No evidence of right lower extremity deep vein thrombosis.

## 2018-10-02 DIAGNOSIS — J0100 Acute maxillary sinusitis, unspecified: Secondary | ICD-10-CM | POA: Diagnosis not present

## 2018-10-14 ENCOUNTER — Encounter: Payer: Self-pay | Admitting: Family Medicine

## 2018-10-14 ENCOUNTER — Ambulatory Visit (INDEPENDENT_AMBULATORY_CARE_PROVIDER_SITE_OTHER): Payer: BLUE CROSS/BLUE SHIELD | Admitting: Family Medicine

## 2018-10-14 VITALS — BP 114/77 | HR 108 | Temp 98.5°F | Ht 64.2 in | Wt 182.0 lb

## 2018-10-14 DIAGNOSIS — M546 Pain in thoracic spine: Secondary | ICD-10-CM

## 2018-10-14 DIAGNOSIS — M9900 Segmental and somatic dysfunction of head region: Secondary | ICD-10-CM | POA: Diagnosis not present

## 2018-10-14 DIAGNOSIS — M9908 Segmental and somatic dysfunction of rib cage: Secondary | ICD-10-CM | POA: Diagnosis not present

## 2018-10-14 DIAGNOSIS — M9904 Segmental and somatic dysfunction of sacral region: Secondary | ICD-10-CM

## 2018-10-14 DIAGNOSIS — M9905 Segmental and somatic dysfunction of pelvic region: Secondary | ICD-10-CM | POA: Diagnosis not present

## 2018-10-14 DIAGNOSIS — M9902 Segmental and somatic dysfunction of thoracic region: Secondary | ICD-10-CM

## 2018-10-14 DIAGNOSIS — M9903 Segmental and somatic dysfunction of lumbar region: Secondary | ICD-10-CM

## 2018-10-14 NOTE — Progress Notes (Signed)
BP 114/77 (BP Location: Left Arm, Patient Position: Sitting, Cuff Size: Normal)   Pulse (!) 108   Temp 98.5 F (36.9 C)   Ht 5' 4.2" (1.631 m)   Wt 182 lb (82.6 kg)   SpO2 96%   BMI 31.05 kg/m    Subjective:    Patient ID: Karla Huber, female    DOB: 1986/07/25, 32 y.o.   MRN: [id number]  HPI: Karla Huber is a 32 y.o. female  Chief Complaint  Patient presents with  . Back Pain   Karla Huber presents today for evaluation and possible treatment with OMT for back and neck pain. She notes that she has been doing OK. Pain comes and goes. Worse with carrying her kids around a lot- and they have been sick. Better with OMT and with stretching. She notes that it is tight and sore in nature. She felt well following her last appointment and started feeling worse again a couple of weeks ago. She notes that she was sick for about 10 days with a URI. She is better now, but had some pretty severe headaches, and facial pain. She is otherwise feeling well with no other concerns or complaints at this time.    Relevant past medical, surgical, family and social history reviewed and updated as indicated. Interim medical history since our last visit reviewed. Allergies and medications reviewed and updated.  Review of Systems  Constitutional: Negative.   HENT: Positive for congestion, postnasal drip, rhinorrhea and sinus pressure. Negative for dental problem, drooling, ear discharge, ear pain, facial swelling, hearing loss, mouth sores, nosebleeds, sinus pain, sneezing, sore throat, tinnitus, trouble swallowing and voice change.   Respiratory: Negative.   Cardiovascular: Negative.   Gastrointestinal: Negative.   Neurological: Positive for headaches. Negative for dizziness, tremors, seizures, syncope, facial asymmetry, speech difficulty, weakness, light-headedness and numbness.  Psychiatric/Behavioral: Negative.     Per HPI unless specifically indicated above     Objective:    BP 114/77 (BP  Location: Left Arm, Patient Position: Sitting, Cuff Size: Normal)   Pulse (!) 108   Temp 98.5 F (36.9 C)   Ht 5' 4.2" (1.631 m)   Wt 182 lb (82.6 kg)   SpO2 96%   BMI 31.05 kg/m   Wt Readings from Last 3 Encounters:  10/14/18 182 lb (82.6 kg)  09/17/18 186 lb (84.4 kg)  09/10/18 186 lb (84.4 kg)    Physical Exam Vitals signs and nursing note reviewed.  Constitutional:      General: She is not in acute distress.    Appearance: Normal appearance. She is not ill-appearing.  HENT:     Head: Normocephalic and atraumatic.     Right Ear: External ear normal.     Left Ear: External ear normal.     Nose: Nose normal.     Mouth/Throat:     Mouth: Mucous membranes are moist.     Pharynx: Oropharynx is clear.  Eyes:     Extraocular Movements: Extraocular movements intact.     Conjunctiva/sclera: Conjunctivae normal.     Pupils: Pupils are equal, round, and reactive to light.  Neck:     Musculoskeletal: No muscular tenderness.     Vascular: No carotid bruit.  Cardiovascular:     Rate and Rhythm: Normal rate.     Pulses: Normal pulses.  Pulmonary:     Effort: Pulmonary effort is normal. No respiratory distress.  Abdominal:     General: Abdomen is flat. There is no distension.  Palpations: Abdomen is soft. There is no mass.     Tenderness: There is no abdominal tenderness. There is no right CVA tenderness, left CVA tenderness, guarding or rebound.     Hernia: No hernia is present.  Lymphadenopathy:     Cervical: No cervical adenopathy.  Skin:    General: Skin is warm and dry.     Capillary Refill: Capillary refill takes less than 2 seconds.     Coloration: Skin is not jaundiced or pale.     Findings: No bruising, erythema, lesion or rash.  Neurological:     General: No focal deficit present.     Mental Status: She is alert. Mental status is at baseline.  Psychiatric:        Mood and Affect: Mood normal.        Behavior: Behavior normal.        Thought Content: Thought  content normal.        Judgment: Judgment normal.   Musculoskeletal:  Exam found Decreased ROM, Tissue texture changes, Tenderness to palpation and Asymmetry of patient's  head, thorax, ribs, lumbar, pelvis and sacrum Osteopathic Structural Exam:   Head: OAESSR, hypertonic suboccipital muscles, R temporal internally rotated, r zygoma restricted  Thorax: T3-5SRRL  Ribs: Ribs 5-7 locked up on the R  Lumbar: QL hypertonic on the R, L3-5SRRL  Pelvis: posterior R innominate  Sacrum: R on R torsion  Results for orders placed or performed in visit on 09/17/18  CBC with Differential/Platelet  Result Value Ref Range   WBC 7.2 3.4 - 10.8 x10E3/uL   RBC 4.46 3.77 - 5.28 x10E6/uL   Hemoglobin 14.6 11.1 - 15.9 g/dL   Hematocrit 86.542.8 78.434.0 - 46.6 %   MCV 96 79 - 97 fL   MCH 32.7 26.6 - 33.0 pg   MCHC 34.1 31.5 - 35.7 g/dL   RDW 69.611.7 (L) 29.512.3 - 28.415.4 %   Platelets 351 150 - 450 x10E3/uL   Neutrophils 66 Not Estab. %   Lymphs 24 Not Estab. %   Monocytes 6 Not Estab. %   Eos 3 Not Estab. %   Basos 1 Not Estab. %   Neutrophils Absolute 4.8 1.4 - 7.0 x10E3/uL   Lymphocytes Absolute 1.7 0.7 - 3.1 x10E3/uL   Monocytes Absolute 0.4 0.1 - 0.9 x10E3/uL   EOS (ABSOLUTE) 0.2 0.0 - 0.4 x10E3/uL   Basophils Absolute 0.0 0.0 - 0.2 x10E3/uL   Immature Granulocytes 0 Not Estab. %   Immature Grans (Abs) 0.0 0.0 - 0.1 x10E3/uL  Comprehensive metabolic panel  Result Value Ref Range   Glucose 102 (H) 65 - 99 mg/dL   BUN 10 6 - 20 mg/dL   Creatinine, Ser 1.320.75 0.57 - 1.00 mg/dL   GFR calc non Af Amer 105 >59 mL/min/1.73   GFR calc Af Amer 121 >59 mL/min/1.73   BUN/Creatinine Ratio 13 9 - 23   Sodium 140 134 - 144 mmol/L   Potassium 4.4 3.5 - 5.2 mmol/L   Chloride 105 96 - 106 mmol/L   CO2 20 20 - 29 mmol/L   Calcium 9.7 8.7 - 10.2 mg/dL   Total Protein 7.1 6.0 - 8.5 g/dL   Albumin 4.6 3.5 - 5.5 g/dL   Globulin, Total 2.5 1.5 - 4.5 g/dL   Albumin/Globulin Ratio 1.8 1.2 - 2.2   Bilirubin Total 0.4 0.0 -  1.2 mg/dL   Alkaline Phosphatase 72 39 - 117 IU/L   AST 20 0 - 40 IU/L   ALT 47 (H)  0 - 32 IU/L  Lipid Panel w/o Chol/HDL Ratio  Result Value Ref Range   Cholesterol, Total 181 100 - 199 mg/dL   Triglycerides 161 0 - 149 mg/dL   HDL 51 >09 mg/dL   VLDL Cholesterol Cal 25 5 - 40 mg/dL   LDL Calculated 604 (H) 0 - 99 mg/dL  TSH  Result Value Ref Range   TSH 1.260 0.450 - 4.500 uIU/mL  UA/M w/rflx Culture, Routine  Result Value Ref Range   Specific Gravity, UA <1.005 (L) 1.005 - 1.030   pH, UA 7.0 5.0 - 7.5   Color, UA Straw Yellow   Appearance Ur Hazy (A) Clear   Leukocytes, UA Negative Negative   Protein, UA Negative Negative/Trace   Glucose, UA Negative Negative   Ketones, UA Negative Negative   RBC, UA Negative Negative   Bilirubin, UA Negative Negative   Urobilinogen, Ur 0.2 0.2 - 1.0 mg/dL   Nitrite, UA Negative Negative  HM PAP SMEAR  Result Value Ref Range   HM Pap smear Patient Reported       Assessment & Plan:   Problem List Items Addressed This Visit    None    Visit Diagnoses    Acute right-sided thoracic back pain    -  Primary   Acting up after being sick and carrying sick babies. She does have somatic dysfunctions that are contributing to her symptoms. Treated today with good results.    Somatic dysfunction of sacral region       Somatic dysfunction of pelvis region       Somatic dysfunction of rib region       Thoracic segment dysfunction       Somatic dysfunction of lumbar region       Head region somatic dysfunction         After verbal consent was obtained, patient was treated today with osteopathic manipulative medicine to the regions of the head, thorax, ribs, lumbar, pelvis and sacrum using the techniques of cranial, myofascial release, counterstrain, muscle energy, HVLA and soft tissue. Areas of compensation relating to her primary pain source also treated. Patient tolerated the procedure well with good objective and good subjective improvement in  symptoms. She left the room in good condition. She was advised to stay well hydrated and that she may have some soreness following the procedure. If not improving or worsening, she will call and come in. She will return for reevaluation  on a PRN basis.   Follow up plan: Return if symptoms worsen or fail to improve.

## 2018-10-19 ENCOUNTER — Encounter: Payer: Self-pay | Admitting: Family Medicine

## 2018-12-02 ENCOUNTER — Other Ambulatory Visit: Payer: Self-pay | Admitting: *Deleted

## 2018-12-02 MED ORDER — MONTELUKAST SODIUM 10 MG PO TABS: ORAL_TABLET | ORAL | 3 refills | Status: DC

## 2018-12-03 DIAGNOSIS — H66003 Acute suppurative otitis media without spontaneous rupture of ear drum, bilateral: Secondary | ICD-10-CM | POA: Diagnosis not present

## 2018-12-03 DIAGNOSIS — R05 Cough: Secondary | ICD-10-CM | POA: Diagnosis not present

## 2018-12-03 DIAGNOSIS — J0140 Acute pansinusitis, unspecified: Secondary | ICD-10-CM | POA: Diagnosis not present

## 2018-12-07 ENCOUNTER — Telehealth: Payer: Self-pay | Admitting: Family Medicine

## 2018-12-07 ENCOUNTER — Ambulatory Visit (INDEPENDENT_AMBULATORY_CARE_PROVIDER_SITE_OTHER): Payer: BLUE CROSS/BLUE SHIELD | Admitting: Family Medicine

## 2018-12-07 ENCOUNTER — Encounter: Payer: Self-pay | Admitting: Family Medicine

## 2018-12-07 VITALS — BP 130/79 | HR 85 | Temp 99.2°F | Wt 182.4 lb

## 2018-12-07 DIAGNOSIS — J0100 Acute maxillary sinusitis, unspecified: Secondary | ICD-10-CM

## 2018-12-07 MED ORDER — PROMETHAZINE-PHENYLEPHRINE 6.25-5 MG/5ML PO SYRP: 5.0000 mL | ORAL_SOLUTION | ORAL | 0 refills | Status: DC | PRN

## 2018-12-07 MED ORDER — PREDNISONE 50 MG PO TABS: 50.0000 mg | ORAL_TABLET | Freq: Every day | ORAL | 0 refills | Status: DC

## 2018-12-07 MED ORDER — FLUCONAZOLE 150 MG PO TABS: 150.0000 mg | ORAL_TABLET | Freq: Once | ORAL | 0 refills | Status: AC

## 2018-12-07 MED ORDER — DOXYCYCLINE HYCLATE 100 MG PO TABS: 100.0000 mg | ORAL_TABLET | Freq: Two times a day (BID) | ORAL | 0 refills | Status: DC

## 2018-12-07 NOTE — Progress Notes (Signed)
BP 130/79   Pulse 85   Temp 99.2 F (37.3 C) (Oral)   Wt 182 lb 6.4 oz (82.7 kg)   SpO2 98%   BMI 31.11 kg/m    Subjective:    Patient ID: Karla Huber, female    DOB: [birth year], 33 y.o.   MRN: [id number]  HPI: Karla Huber is a 33 y.o. female  Chief Complaint  Patient presents with  . URI    pt states she has been feeling bad for 2 weeks, went to [facility] Thursday and was told she had a sinus infection and double ear infection, given tessalon, cefidnir, and prednisone. States she finished prednisone this morning but is not feeling much better.    UPPER RESPIRATORY TRACT INFECTION- went to the urgent care on 12/03/18- diagnosed with ear infection and sinusitis, started on omnicef, not better. Feeling worse.  Duration: 2 weeks Worst symptom: congestion, cough, sinus pain, ear pain Fever: no Cough: yes Shortness of breath: yes Wheezing: yes Chest pain: yes, with cough Chest tightness: yes Chest congestion: yes Nasal congestion: yes Runny nose: yes Post nasal drip: yes Sneezing: no Sore throat: no Swollen glands: no Sinus pressure: yes Headache: yes Face pain: yes Toothache: yes Ear pain: yes bilateral Ear pressure: yes bilateral Eyes red/itching:no Eye drainage/crusting: no  Vomiting: no Rash: no Fatigue: yes Sick contacts: yes Strep contacts: no  Context: worse Recurrent sinusitis: no Relief with OTC cold/cough medications: no  Treatments attempted: cold/sinus, mucinex, anti-histamine, pseudoephedrine and antibiotics   Relevant past medical, surgical, family and social history reviewed and updated as indicated. Interim medical history since our last visit reviewed. Allergies and medications reviewed and updated.  Review of Systems  Constitutional: Positive for fatigue and fever. Negative for activity change, appetite change, chills, diaphoresis and unexpected weight change.  HENT: Positive for congestion, ear discharge, ear pain, postnasal drip, rhinorrhea,  sinus pressure, sinus pain and sore throat. Negative for dental problem, drooling, facial swelling, hearing loss, mouth sores, nosebleeds, sneezing, tinnitus, trouble swallowing and voice change.   Eyes: Negative.   Respiratory: Negative.   Cardiovascular: Negative.   Gastrointestinal: Negative.   Psychiatric/Behavioral: Negative.     Per HPI unless specifically indicated above     Objective:    BP 130/79   Pulse 85   Temp 99.2 F (37.3 C) (Oral)   Wt 182 lb 6.4 oz (82.7 kg)   SpO2 98%   BMI 31.11 kg/m   Wt Readings from Last 3 Encounters:  12/07/18 182 lb 6.4 oz (82.7 kg)  10/14/18 182 lb (82.6 kg)  09/17/18 186 lb (84.4 kg)    Physical Exam Vitals signs and nursing note reviewed.  Constitutional:      General: She is not in acute distress.    Appearance: Normal appearance. She is not ill-appearing, toxic-appearing or diaphoretic.  HENT:     Head: Normocephalic and atraumatic.     Right Ear: Tympanic membrane, ear canal and external ear normal. There is no impacted cerumen.     Left Ear: Tympanic membrane, ear canal and external ear normal. There is no impacted cerumen.     Nose: Congestion and rhinorrhea present.     Mouth/Throat:     Mouth: Mucous membranes are moist.     Pharynx: Oropharynx is clear. No oropharyngeal exudate or posterior oropharyngeal erythema.  Eyes:     General: No scleral icterus.       Right eye: No discharge.        Left eye: No discharge.  Extraocular Movements: Extraocular movements intact.     Conjunctiva/sclera: Conjunctivae normal.     Pupils: Pupils are equal, round, and reactive to light.  Neck:     Musculoskeletal: Normal range of motion and neck supple. No neck rigidity or muscular tenderness.     Vascular: No carotid bruit.  Cardiovascular:     Rate and Rhythm: Normal rate and regular rhythm.     Pulses: Normal pulses.     Heart sounds: Normal heart sounds. No murmur. No friction rub. No gallop.   Pulmonary:     Effort:  Pulmonary effort is normal. No respiratory distress.     Breath sounds: Normal breath sounds. No stridor. No wheezing, rhonchi or rales.  Chest:     Chest wall: No tenderness.  Musculoskeletal: Normal range of motion.  Lymphadenopathy:     Cervical: Cervical adenopathy present.  Skin:    General: Skin is warm and dry.     Capillary Refill: Capillary refill takes less than 2 seconds.     Coloration: Skin is not jaundiced or pale.     Findings: No bruising, erythema, lesion or rash.  Neurological:     General: No focal deficit present.     Mental Status: She is alert and oriented to person, place, and time. Mental status is at baseline.     Cranial Nerves: No cranial nerve deficit.     Sensory: No sensory deficit.     Motor: No weakness.     Coordination: Coordination normal.     Gait: Gait normal.     Deep Tendon Reflexes: Reflexes normal.  Psychiatric:        Mood and Affect: Mood normal.        Behavior: Behavior normal.        Thought Content: Thought content normal.        Judgment: Judgment normal.     Results for orders placed or performed in visit on 09/17/18  CBC with Differential/Platelet  Result Value Ref Range   WBC 7.2 3.4 - 10.8 x10E3/uL   RBC 4.46 3.77 - 5.28 x10E6/uL   Hemoglobin 14.6 11.1 - 15.9 g/dL   Hematocrit 16.142.8 09.634.0 - 46.6 %   MCV 96 79 - 97 fL   MCH 32.7 26.6 - 33.0 pg   MCHC 34.1 31.5 - 35.7 g/dL   RDW 04.511.7 (L) 40.912.3 - 81.115.4 %   Platelets 351 150 - 450 x10E3/uL   Neutrophils 66 Not Estab. %   Lymphs 24 Not Estab. %   Monocytes 6 Not Estab. %   Eos 3 Not Estab. %   Basos 1 Not Estab. %   Neutrophils Absolute 4.8 1.4 - 7.0 x10E3/uL   Lymphocytes Absolute 1.7 0.7 - 3.1 x10E3/uL   Monocytes Absolute 0.4 0.1 - 0.9 x10E3/uL   EOS (ABSOLUTE) 0.2 0.0 - 0.4 x10E3/uL   Basophils Absolute 0.0 0.0 - 0.2 x10E3/uL   Immature Granulocytes 0 Not Estab. %   Immature Grans (Abs) 0.0 0.0 - 0.1 x10E3/uL  Comprehensive metabolic panel  Result Value Ref Range    Glucose 102 (H) 65 - 99 mg/dL   BUN 10 6 - 20 mg/dL   Creatinine, Ser 9.140.75 0.57 - 1.00 mg/dL   GFR calc non Af Amer 105 >59 mL/min/1.73   GFR calc Af Amer 121 >59 mL/min/1.73   BUN/Creatinine Ratio 13 9 - 23   Sodium 140 134 - 144 mmol/L   Potassium 4.4 3.5 - 5.2 mmol/L   Chloride 105 96 - 106  mmol/L   CO2 20 20 - 29 mmol/L   Calcium 9.7 8.7 - 10.2 mg/dL   Total Protein 7.1 6.0 - 8.5 g/dL   Albumin 4.6 3.5 - 5.5 g/dL   Globulin, Total 2.5 1.5 - 4.5 g/dL   Albumin/Globulin Ratio 1.8 1.2 - 2.2   Bilirubin Total 0.4 0.0 - 1.2 mg/dL   Alkaline Phosphatase 72 39 - 117 IU/L   AST 20 0 - 40 IU/L   ALT 47 (H) 0 - 32 IU/L  Lipid Panel w/o Chol/HDL Ratio  Result Value Ref Range   Cholesterol, Total 181 100 - 199 mg/dL   Triglycerides 161124 0 - 149 mg/dL   HDL 51 >09>39 mg/dL   VLDL Cholesterol Cal 25 5 - 40 mg/dL   LDL Calculated 604105 (H) 0 - 99 mg/dL  TSH  Result Value Ref Range   TSH 1.260 0.450 - 4.500 uIU/mL  UA/M w/rflx Culture, Routine  Result Value Ref Range   Specific Gravity, UA <1.005 (L) 1.005 - 1.030   pH, UA 7.0 5.0 - 7.5   Color, UA Straw Yellow   Appearance Ur Hazy (A) Clear   Leukocytes, UA Negative Negative   Protein, UA Negative Negative/Trace   Glucose, UA Negative Negative   Ketones, UA Negative Negative   RBC, UA Negative Negative   Bilirubin, UA Negative Negative   Urobilinogen, Ur 0.2 0.2 - 1.0 mg/dL   Nitrite, UA Negative Negative  HM PAP SMEAR  Result Value Ref Range   HM Pap smear Patient Reported       Assessment & Plan:   Problem List Items Addressed This Visit    None    Visit Diagnoses    Acute non-recurrent maxillary sinusitis    -  Primary   Will change to doxycycline and treat with phenergan cough syrup and prednisone. Call with any concerns. Continue to monitor. Call with any concerns.    Relevant Medications   cefdinir (OMNICEF) 300 MG capsule   benzonatate (TESSALON) 100 MG capsule   doxycycline (VIBRA-TABS) 100 MG tablet    promethazine-phenylephrine (PROMETHAZINE VC) 6.25-5 MG/5ML SYRP   fluconazole (DIFLUCAN) 150 MG tablet   predniSONE (DELTASONE) 50 MG tablet       Follow up plan: Return if symptoms worsen or fail to improve.

## 2018-12-07 NOTE — Telephone Encounter (Signed)
Copied from CRM [id number]. Topic: Quick Communication - See Telephone Encounter >> Dec 07, 2018  4:37 PM Jens Som A wrote: CRM for notification. See Telephone encounter for: 12/07/18.  Barbara calling from [facility] in [place] is calling regarding promethazine-phenylephrine (PROMETHAZINE VC) 6.25-5 MG/5ML SYRP [[id number]]   The medication is no longer available. Is another script able to be sent? Please advise [phone]

## 2018-12-08 MED ORDER — PROMETHAZINE-DM 6.25-15 MG/5ML PO SYRP: 5.0000 mL | ORAL_SOLUTION | Freq: Four times a day (QID) | ORAL | 0 refills | Status: DC | PRN

## 2019-01-07 ENCOUNTER — Inpatient Hospital Stay: Payer: BLUE CROSS/BLUE SHIELD | Attending: Family | Admitting: Hematology & Oncology

## 2019-01-07 ENCOUNTER — Other Ambulatory Visit: Payer: Self-pay

## 2019-01-07 ENCOUNTER — Inpatient Hospital Stay: Payer: BLUE CROSS/BLUE SHIELD

## 2019-01-07 ENCOUNTER — Encounter: Payer: Self-pay | Admitting: Family Medicine

## 2019-01-07 VITALS — BP 119/64 | HR 75 | Temp 98.7°F | Resp 17 | Wt 185.2 lb

## 2019-01-07 DIAGNOSIS — D509 Iron deficiency anemia, unspecified: Secondary | ICD-10-CM

## 2019-01-07 DIAGNOSIS — N921 Excessive and frequent menstruation with irregular cycle: Secondary | ICD-10-CM

## 2019-01-07 DIAGNOSIS — D500 Iron deficiency anemia secondary to blood loss (chronic): Secondary | ICD-10-CM

## 2019-01-07 DIAGNOSIS — Z862 Personal history of diseases of the blood and blood-forming organs and certain disorders involving the immune mechanism: Secondary | ICD-10-CM | POA: Insufficient documentation

## 2019-01-07 DIAGNOSIS — K909 Intestinal malabsorption, unspecified: Secondary | ICD-10-CM

## 2019-01-07 DIAGNOSIS — O99019 Anemia complicating pregnancy, unspecified trimester: Secondary | ICD-10-CM

## 2019-01-07 LAB — CBC WITH DIFFERENTIAL (CANCER CENTER ONLY)
Abs Immature Granulocytes: 0.03 10*3/uL (ref 0.00–0.07)
Basophils Absolute: 0 10*3/uL (ref 0.0–0.1)
Basophils Relative: 0 %
Eosinophils Absolute: 0.2 10*3/uL (ref 0.0–0.5)
Eosinophils Relative: 2 %
HCT: 42.1 % (ref 36.0–46.0)
Hemoglobin: 14.3 g/dL (ref 12.0–15.0)
Immature Granulocytes: 0 %
Lymphocytes Relative: 27 %
Lymphs Abs: 2.1 10*3/uL (ref 0.7–4.0)
MCH: 32.7 pg (ref 26.0–34.0)
MCHC: 34 g/dL (ref 30.0–36.0)
MCV: 96.3 fL (ref 80.0–100.0)
Monocytes Absolute: 0.4 10*3/uL (ref 0.1–1.0)
Monocytes Relative: 5 %
Neutro Abs: 5.2 10*3/uL (ref 1.7–7.7)
Neutrophils Relative %: 66 %
Platelet Count: 371 10*3/uL (ref 150–400)
RBC: 4.37 MIL/uL (ref 3.87–5.11)
RDW: 11.7 % (ref 11.5–15.5)
WBC Count: 7.9 10*3/uL (ref 4.0–10.5)
nRBC: 0 % (ref 0.0–0.2)

## 2019-01-07 LAB — RETICULOCYTES
Immature Retic Fract: 7.1 % (ref 2.3–15.9)
RBC.: 4.37 MIL/uL (ref 3.87–5.11)
Retic Count, Absolute: 102.3 10*3/uL (ref 19.0–186.0)
Retic Ct Pct: 2.3 % (ref 0.4–3.1)

## 2019-01-07 NOTE — Progress Notes (Signed)
Hematology and Oncology Follow Up Visit  Karla Huber [id number] 1986-03-24 33 y.o. 01/07/2019   Principle Diagnosis: Iron deficiency anemia  Current Therapy:   IV iron (Injectafer) as indicated - last received in July 2019 x 2   Interim History:  Karla Huber is a 33 y/o caucasian female that is here for a follow for iron deficiency anemia.  She is here to be doing pretty well.  However, recently, she seemed to have a very bad case of sinusitis and possibly bronchitis.  She is on antibiotics.  She is on prednisone for 10 days.  She gained quite a bit of weight that she had lost.  She is not too happy about the weight gain.  Only last saw her back in November, her iron studies showed a ferritin of 415 with an iron saturation of 33%.  She has regular monthly cycles.  She said that her oral contraceptive was changed.  She has had no issues with fever.  She has had no cough or shortness of breath.  She has had no nausea or vomiting.  Her kids have not been all that well.  Hopefully, they will improve in their health.  Currently, her performance status is ECOG 0.     Medications:  Allergies as of 01/07/2019      Reactions   Amoxicillin Hives   Has patient had a PCN reaction causing immediate rash, facial/tongue/throat swelling, SOB or lightheadedness with hypotension: {no Has patient had a PCN reaction causing severe rash involving mucus membranes or skin necrosis: {no Has patient had a PCN reaction that required hospitalization no Has patient had a PCN reaction occurring within the last 10 years: {yes If all of the above answers are "NO", then may proceed with Cephalosporin use.   Hydrocodone Hives   Tdap [tetanus-diphth-acell Pertussis] Other (See Comments)   Fever, Joint stiffness      Medication List       Accurate as of January 07, 2019 12:00 PM. Always use your most recent med list.        albuterol 108 (90 Base) MCG/ACT inhaler Commonly known as:  PROVENTIL  HFA;VENTOLIN HFA Use 2 puffs every four hours as needed for cough or wheeze.  May use 2 puffs 10-20 minutes prior to exercise.   doxycycline 100 MG tablet Commonly known as:  VIBRA-TABS Take 1 tablet (100 mg total) by mouth 2 (two) times daily.   Flovent HFA 44 MCG/ACT inhaler Generic drug:  fluticasone TAKE 2 PUFFS BY MOUTH TWICE A DAY   Junel Fe 24 1-20 MG-MCG(24) tablet Generic drug:  Norethindrone Acetate-Ethinyl Estrad-FE Take 1 tablet by mouth daily.   metroNIDAZOLE 1 % gel Commonly known as:  METROGEL Apply topically daily.   montelukast 10 MG tablet Commonly known as:  SINGULAIR TAKE 1 TABLET BY MOUTH EVERYDAY AT BEDTIME   omeprazole 20 MG tablet Commonly known as:  PRILOSEC OTC Take 20 mg by mouth daily.   predniSONE 50 MG tablet Commonly known as:  DELTASONE Take 1 tablet (50 mg total) by mouth daily with breakfast.   PRENATAL VITAMIN PO Take by mouth.   promethazine-dextromethorphan 6.25-15 MG/5ML syrup Commonly known as:  PROMETHAZINE-DM Take 5 mLs by mouth 4 (four) times daily as needed for cough.   promethazine-phenylephrine 6.25-5 MG/5ML Syrp Commonly known as:  PROMETHAZINE VC Take 5 mLs by mouth every 4 (four) hours as needed for congestion.   pseudoephedrine 120 MG 12 hr tablet Commonly known as:  SUDAFED Take by mouth.  Rhinocort Allergy 32 MCG/ACT nasal spray Generic drug:  budesonide Place 2 sprays into both nostrils daily.       Allergies:  Allergies  Allergen Reactions  . Amoxicillin Hives    Has patient had a PCN reaction causing immediate rash, facial/tongue/throat swelling, SOB or lightheadedness with hypotension: {no Has patient had a PCN reaction causing severe rash involving mucus membranes or skin necrosis: {no Has patient had a PCN reaction that required hospitalization no Has patient had a PCN reaction occurring within the last 10 years: {yes If all of the above answers are "NO", then may proceed with Cephalosporin use.  Marland Kitchen  Hydrocodone Hives  . Tdap [Tetanus-Diphth-Acell Pertussis] Other (See Comments)    Fever, Joint stiffness    Past Medical History, Surgical history, Social history, and Family History were reviewed and updated.  Review of Systems: Review of Systems  Constitutional: Negative.   HENT: Negative.   Eyes: Negative.   Respiratory: Negative.   Cardiovascular: Negative.   Gastrointestinal: Negative.   Genitourinary: Negative.   Musculoskeletal: Negative.   Skin: Negative.   Neurological: Negative.   Endo/Heme/Allergies: Negative.   Psychiatric/Behavioral: Negative.       Physical Exam:  weight is 185 lb 4 oz (84 kg). Her oral temperature is 98.7 F (37.1 C). Her blood pressure is 119/64 and her pulse is 75. Her respiration is 17.   Wt Readings from Last 3 Encounters:  01/07/19 185 lb 4 oz (84 kg)  12/07/18 182 lb 6.4 oz (82.7 kg)  10/14/18 182 lb (82.6 kg)    Physical Exam Vitals signs reviewed.  HENT:     Head: Normocephalic and atraumatic.  Eyes:     Pupils: Pupils are equal, round, and reactive to light.  Neck:     Musculoskeletal: Normal range of motion.  Cardiovascular:     Rate and Rhythm: Normal rate and regular rhythm.     Heart sounds: Normal heart sounds.  Pulmonary:     Effort: Pulmonary effort is normal.     Breath sounds: Normal breath sounds.  Abdominal:     General: Bowel sounds are normal.     Palpations: Abdomen is soft.  Musculoskeletal: Normal range of motion.        General: No tenderness or deformity.  Lymphadenopathy:     Cervical: No cervical adenopathy.  Skin:    General: Skin is warm and dry.     Findings: No erythema or rash.  Neurological:     Mental Status: She is alert and oriented to person, place, and time.  Psychiatric:        Behavior: Behavior normal.        Thought Content: Thought content normal.        Judgment: Judgment normal.      Lab Results  Component Value Date   WBC 7.9 01/07/2019   HGB 14.3 01/07/2019   HCT  42.1 01/07/2019   MCV 96.3 01/07/2019   PLT 371 01/07/2019   Lab Results  Component Value Date   FERRITIN 415 (H) 09/10/2018   IRON 139 09/10/2018   TIBC 416 09/10/2018   UIBC 277 09/10/2018   IRONPCTSAT 33 09/10/2018   Lab Results  Component Value Date   RETICCTPCT 2.3 01/07/2019   RBC 4.37 01/07/2019   RBC 4.37 01/07/2019   No results found for: KPAFRELGTCHN, LAMBDASER, KAPLAMBRATIO No results found for: IGGSERUM, IGA, IGMSERUM No results found for: TOTALPROTELP, ALBUMINELP, A1GS, A2GS, BETS, BETA2SER, GAMS, MSPIKE, SPEI   Chemistry  Component Value Date/Time   NA 140 09/17/2018 0920   K 4.4 09/17/2018 0920   CL 105 09/17/2018 0920   CO2 20 09/17/2018 0920   BUN 10 09/17/2018 0920   CREATININE 0.75 09/17/2018 0920   CREATININE 0.80 12/08/2017 1003      Component Value Date/Time   CALCIUM 9.7 09/17/2018 0920   ALKPHOS 72 09/17/2018 0920   AST 20 09/17/2018 0920   AST 20 12/08/2017 1003   ALT 47 (H) 09/17/2018 0920   ALT 18 12/08/2017 1003   BILITOT 0.4 09/17/2018 0920   BILITOT 0.5 12/08/2017 1003       Impression and Plan: Ms. Sweda is a very pleasant 33 y/o caucasian female with a history of iron deficiency anemia due to heavy cycles.  We will have to see what her iron studies show.  Even though she is not anemic, her hemoglobin is dropping slowly.  Overall, I think we can probably get her back in 4 months.    Josph Macho, MD/ 3/12/202012:00 PM

## 2019-01-08 LAB — IRON AND TIBC
Iron: 110 ug/dL (ref 41–142)
Saturation Ratios: 28 % (ref 21–57)
TIBC: 395 ug/dL (ref 236–444)
UIBC: 285 ug/dL (ref 120–384)

## 2019-01-08 LAB — FERRITIN: Ferritin: 349 ng/mL — ABNORMAL HIGH (ref 11–307)

## 2019-01-08 MED ORDER — FLUTICASONE PROPIONATE HFA 44 MCG/ACT IN AERO: INHALATION_SPRAY | RESPIRATORY_TRACT | 3 refills | Status: DC

## 2019-01-17 ENCOUNTER — Encounter: Payer: Self-pay | Admitting: Family Medicine

## 2019-01-18 MED ORDER — FLUTICASONE PROPIONATE HFA 110 MCG/ACT IN AERO: 2.0000 | INHALATION_SPRAY | Freq: Two times a day (BID) | RESPIRATORY_TRACT | 4 refills | Status: DC

## 2019-01-18 MED ORDER — FLUTICASONE PROPIONATE HFA 110 MCG/ACT IN AERO: 2.0000 | INHALATION_SPRAY | Freq: Two times a day (BID) | RESPIRATORY_TRACT | 3 refills | Status: DC

## 2019-01-18 NOTE — Addendum Note (Signed)
Addended by: Dorcas Carrow on: 01/18/2019 02:18 PM   Modules accepted: Orders

## 2019-03-19 DIAGNOSIS — Z6833 Body mass index (BMI) 33.0-33.9, adult: Secondary | ICD-10-CM | POA: Diagnosis not present

## 2019-03-19 DIAGNOSIS — Z8632 Personal history of gestational diabetes: Secondary | ICD-10-CM | POA: Diagnosis not present

## 2019-03-19 DIAGNOSIS — Z01419 Encounter for gynecological examination (general) (routine) without abnormal findings: Secondary | ICD-10-CM | POA: Diagnosis not present

## 2019-03-30 ENCOUNTER — Other Ambulatory Visit: Payer: Self-pay

## 2019-03-30 ENCOUNTER — Ambulatory Visit (INDEPENDENT_AMBULATORY_CARE_PROVIDER_SITE_OTHER): Payer: BC Managed Care – PPO | Admitting: Family Medicine

## 2019-03-30 ENCOUNTER — Encounter: Payer: Self-pay | Admitting: Family Medicine

## 2019-03-30 VITALS — BP 113/77 | HR 97 | Temp 99.2°F | Ht 63.0 in | Wt 185.0 lb

## 2019-03-30 DIAGNOSIS — Z6832 Body mass index (BMI) 32.0-32.9, adult: Secondary | ICD-10-CM

## 2019-03-30 DIAGNOSIS — M9901 Segmental and somatic dysfunction of cervical region: Secondary | ICD-10-CM

## 2019-03-30 DIAGNOSIS — M542 Cervicalgia: Secondary | ICD-10-CM | POA: Diagnosis not present

## 2019-03-30 DIAGNOSIS — M9902 Segmental and somatic dysfunction of thoracic region: Secondary | ICD-10-CM

## 2019-03-30 DIAGNOSIS — M9900 Segmental and somatic dysfunction of head region: Secondary | ICD-10-CM | POA: Diagnosis not present

## 2019-03-30 DIAGNOSIS — M9903 Segmental and somatic dysfunction of lumbar region: Secondary | ICD-10-CM

## 2019-03-30 DIAGNOSIS — M9905 Segmental and somatic dysfunction of pelvic region: Secondary | ICD-10-CM | POA: Diagnosis not present

## 2019-03-30 DIAGNOSIS — M545 Low back pain, unspecified: Secondary | ICD-10-CM

## 2019-03-30 DIAGNOSIS — M9908 Segmental and somatic dysfunction of rib cage: Secondary | ICD-10-CM

## 2019-03-30 DIAGNOSIS — M9904 Segmental and somatic dysfunction of sacral region: Secondary | ICD-10-CM | POA: Diagnosis not present

## 2019-03-30 DIAGNOSIS — E6609 Other obesity due to excess calories: Secondary | ICD-10-CM

## 2019-03-30 NOTE — Progress Notes (Signed)
BP 113/77   Pulse 97   Temp 99.2 F (37.3 C) (Oral)   Ht 5\' 3"  (1.6 m)   Wt 185 lb (83.9 kg)   SpO2 96%   BMI 32.77 kg/m    Subjective:    Patient ID: Karla Huber, female    DOB: [birth year], 33 y.o.   MRN: [id number]  HPI: Karla Huber is a 33 y.o. female  Chief Complaint  Patient presents with  . Back Pain    lower back   [name] presents today with pain in her neck and low back. Her neck has been hurting for about 2-3 weeks and her low back around the same amount, possibly a little longer. She notes that she has been under a lot of stress helping to plan a wedding in short order, and feels like that has made her neck hurt. Her neck is tight and sore. Better with stretching and worse with stress. Pain radiates into her neck. She also notes that her low back has been acting up. Sore and aching in nature. No radiation. She is not sure what she did to start it, but she has been working out more and is wondering if that has caused it. She is otherwise feeling well.   WEIGHT GAIN Duration: since having her babies, about 4 years Previous attempts at weight loss: yes- currently watching her diet and working out about 1-1.5 hours a day Complications of obesity: none  Peak weight: current Weight loss goal: to be healthy Weight loss to date: none Requesting obesity pharmacotherapy: yes Current weight loss supplements/medications: no Previous weight loss supplements/meds: no   Relevant past medical, surgical, family and social history reviewed and updated as indicated. Interim medical history since our last visit reviewed. Allergies and medications reviewed and updated.  Review of Systems  Constitutional: Negative.   Respiratory: Negative.   Cardiovascular: Negative.   Musculoskeletal: Positive for arthralgias, back pain, myalgias, neck pain and neck stiffness. Negative for gait problem and joint swelling.  Skin: Negative.   Neurological: Negative.    Psychiatric/Behavioral: Negative.     Per HPI unless specifically indicated above     Objective:    BP 113/77   Pulse 97   Temp 99.2 F (37.3 C) (Oral)   Ht 5\' 3"  (1.6 m)   Wt 185 lb (83.9 kg)   SpO2 96%   BMI 32.77 kg/m   Wt Readings from Last 3 Encounters:  03/30/19 185 lb (83.9 kg)  01/07/19 185 lb 4 oz (84 kg)  12/07/18 182 lb 6.4 oz (82.7 kg)    Physical Exam Vitals signs and nursing note reviewed.  Constitutional:      General: She is not in acute distress.    Appearance: Normal appearance. She is not ill-appearing, toxic-appearing or diaphoretic.  HENT:     Head: Normocephalic and atraumatic.     Right Ear: External ear normal.     Left Ear: External ear normal.     Nose: Nose normal.     Mouth/Throat:     Mouth: Mucous membranes are moist.     Pharynx: Oropharynx is clear.  Eyes:     General: No scleral icterus.       Right eye: No discharge.        Left eye: No discharge.     Extraocular Movements: Extraocular movements intact.     Conjunctiva/sclera: Conjunctivae normal.     Pupils: Pupils are equal, round, and reactive to light.  Neck:  Musculoskeletal: Normal range of motion and neck supple.  Cardiovascular:     Rate and Rhythm: Normal rate and regular rhythm.     Pulses: Normal pulses.     Heart sounds: Normal heart sounds. No murmur. No friction rub. No gallop.   Pulmonary:     Effort: Pulmonary effort is normal. No respiratory distress.     Breath sounds: Normal breath sounds. No stridor. No wheezing, rhonchi or rales.  Chest:     Chest wall: No tenderness.  Musculoskeletal: Normal range of motion.  Skin:    General: Skin is warm and dry.     Capillary Refill: Capillary refill takes less than 2 seconds.     Coloration: Skin is not jaundiced or pale.     Findings: No bruising, erythema, lesion or rash.  Neurological:     General: No focal deficit present.     Mental Status: She is alert and oriented to person, place, and time. Mental  status is at baseline.  Psychiatric:        Mood and Affect: Mood normal.        Behavior: Behavior normal.        Thought Content: Thought content normal.        Judgment: Judgment normal.   Musculoskeletal:  Exam found Decreased ROM, Tissue texture changes, Tenderness to palpation and Asymmetry of patient's  head, neck, thorax, ribs, lumbar, pelvis and sacrum Osteopathic Structural Exam:   Head: OAESSR, hypertonic suboccipital muscles  Neck: trap spasm on the L, SCM hypertonic on the L, C3ESRR, C2Rotated L:   Thorax: T4-5SRRL  Ribs: Ribs 7-8 locked up on the R  Lumbar: L3-4 SLRR, L2ESRL  Pelvis: anterior R innominate  Sacrum: R on R torsion   Results for orders placed or performed in visit on 01/07/19  CBC with Differential ([facility] Only)  Result Value Ref Range   WBC Count 7.9 4.0 - 10.5 K/uL   RBC 4.37 3.87 - 5.11 MIL/uL   Hemoglobin 14.3 12.0 - 15.0 g/dL   HCT 16.1 09.6 - 04.5 %   MCV 96.3 80.0 - 100.0 fL   MCH 32.7 26.0 - 34.0 pg   MCHC 34.0 30.0 - 36.0 g/dL   RDW 40.9 81.1 - 91.4 %   Platelet Count 371 150 - 400 K/uL   nRBC 0.0 0.0 - 0.2 %   Neutrophils Relative % 66 %   Neutro Abs 5.2 1.7 - 7.7 K/uL   Lymphocytes Relative 27 %   Lymphs Abs 2.1 0.7 - 4.0 K/uL   Monocytes Relative 5 %   Monocytes Absolute 0.4 0.1 - 1.0 K/uL   Eosinophils Relative 2 %   Eosinophils Absolute 0.2 0.0 - 0.5 K/uL   Basophils Relative 0 %   Basophils Absolute 0.0 0.0 - 0.1 K/uL   Immature Granulocytes 0 %   Abs Immature Granulocytes 0.03 0.00 - 0.07 K/uL  Reticulocytes  Result Value Ref Range   Retic Ct Pct 2.3 0.4 - 3.1 %   RBC. 4.37 3.87 - 5.11 MIL/uL   Retic Count, Absolute 102.3 19.0 - 186.0 K/uL   Immature Retic Fract 7.1 2.3 - 15.9 %  Iron and TIBC  Result Value Ref Range   Iron 110 41 - 142 ug/dL   TIBC 782 956 - 213 ug/dL   Saturation Ratios 28 21 - 57 %   UIBC 285 120 - 384 ug/dL  Ferritin  Result Value Ref Range   Ferritin 349 (H) 11 - 307 ng/mL  Assessment & Plan:   Problem List Items Addressed This Visit      Other   Obesity    Discussed weight loss medication. Options given today to consider. Will call if she decides she wants to start one. Call with any concerns.        Other Visit Diagnoses    Neck pain    -  Primary   Myofascial in nature. She has somatic dysfunction that is contributing to her symptoms. Treated today with good results as below. Call with any concerns.    Acute bilateral low back pain without sciatica       Myofascial in nature. She has somatic dysfunction that is contributing to her symptoms. Treated today with good results as below. Call with any concerns.    Somatic dysfunction of sacral region       Somatic dysfunction of pelvis region       Somatic dysfunction of rib region       Thoracic segment dysfunction       Somatic dysfunction of lumbar region       Head region somatic dysfunction       Cervical (neck) region somatic dysfunction         After verbal consent was obtained, patient was treated today with osteopathic manipulative medicine to the regions of the head, neck, thorax, ribs, lumbar, pelvis and sacrum using the techniques of cranial, myofascial release, counterstrain, muscle energy, HVLA and soft tissue. Areas of compensation relating to her primary pain source also treated. Patient tolerated the procedure well with good objective and good subjective improvement in symptoms. She left the room in good condition. He was advised to stay well hydrated and that he may have some soreness following the procedure. If not improving or worsening, he will call and come in. She will return for reevaluation  on a PRN basis.   Follow up plan: Return if symptoms worsen or fail to improve.

## 2019-03-30 NOTE — Patient Instructions (Signed)
Lorcaserin extended-release tablets What is this medicine? LORCASERIN (lor ca SER in) is used to promote and maintain weight loss in obese patients. This medicine should be used with a reduced calorie diet and, if appropriate, an exercise program. This medicine may be used for other purposes; ask your health care provider or pharmacist if you have questions. COMMON BRAND NAME(S): Belviq XR What should I tell my health care provider before I take this medicine? They need to know if you have any of these conditions: -anatomical deformation of the penis, Peyronie's disease, or history of priapism (painful and prolonged erection) -diabetes -heart disease -history of blood diseases, like sickle cell anemia or leukemia -history of irregular heartbeat -kidney disease -liver disease -suicidal thoughts, plans, or attempt; a previous suicide attempt by you or a family member -an unusual or allergic reaction to lorcaserin, other medicines, foods, dyes, or preservatives -pregnant or trying to get pregnant -breast-feeding How should I use this medicine? Take this medicine by mouth with a glass of water. Follow the directions on the prescription label. Do not cut, crush or chew this medicine. You can take it with or without food. Take your medicine at regular intervals. Do not take it more often than directed. Do not stop taking except on your doctor's advice. Talk to your pediatrician regarding the use of this medicine in children. Special care may be needed. Overdosage: If you think you have taken too much of this medicine contact a poison control center or emergency room at once. NOTE: This medicine is only for you. Do not share this medicine with others. What if I miss a dose? If you miss a dose, take it as soon as you can. If it is almost time for your next dose, take only that dose. Do not take double or extra doses. What may interact with this medicine? -cabergoline -certain medicines for  depression, anxiety, or psychotic disturbances -certain medicines for erectile dysfunction -certain medicines for migraine headache like almotriptan, eletriptan, frovatriptan, naratriptan, rizatriptan, sumatriptan, zolmitriptan -dextromethorphan -linezolid -lithium -medicines for diabetes -other weight loss products -tramadol -St. John's Wort -stimulant medicines for attention disorders, weight loss, or to stay awake -tryptophan This list may not describe all possible interactions. Give your health care provider a list of all the medicines, herbs, non-prescription drugs, or dietary supplements you use. Also tell them if you smoke, drink alcohol, or use illegal drugs. Some items may interact with your medicine. What should I watch for while using this medicine? This medicine is intended to be used in addition to a healthy diet and appropriate exercise. The best results are achieved this way. Your doctor should instruct you to stop using this medicine if you do not lose a certain amount of weight within the first 12 weeks of treatment, but it is important that you do not change your dose in any way without consulting your doctor or health care professional. Visit your doctor or health care professional for regular checkups. Your doctor may order blood tests or other tests to see how you are doing. Do not drive, use machinery, or do anything that needs mental alertness until you know how this medicine affects you. This medicine may affect blood sugar levels. If you have diabetes, check with your doctor or health care professional before you change your diet or the dose of your diabetic medicine. Patients and their families should watch out for worsening depression or thoughts of suicide. Also watch out for sudden changes in feelings such as feeling anxious,  agitated, panicky, irritable, hostile, aggressive, impulsive, severely restless, overly excited and hyperactive, or not being able to sleep. If  this happens, especially at the beginning of treatment or after a change in dose, call your health care professional. Contact your doctor or health care professional right away if you are a man with an erection that lasts longer than 4 hours or if the erection becomes painful. This may be a sign of serious problem and must be treated right away to prevent permanent damage. What side effects may I notice from receiving this medicine? Side effects that you should report to your doctor or health care professional as soon as possible: -allergic reactions like skin rash, itching or hives, swelling of the face, lips, or tongue -abnormal production of milk -breast enlargement in both males and females -breathing problems -changes in emotions or moods -changes in vision -confusion -erection lasting more than 4 hours or a painful erection -fast or irregular heart beat -feeling faint or lightheaded, falls -fever or chills, sore throat -hallucination, loss of contact with reality -high or low blood pressure -menstrual changes -restlessness -signs and symptoms of low blood sugar such as feeling anxious; confusion; dizziness; increased hunger; unusually weak or tired; sweating; shakiness; cold; irritable; headache; blurred vision; fast heartbeat; loss of consciousness -slow or irregular heartbeat -stiff muscles -sweating -suicidal thoughts or actions -swelling of the ankles, feet, hands -unusually weak or tired -vomiting Side effects that usually do not require medical attention (report to your doctor or health care professional if they continue or are bothersome): -back pain -constipation -cough -dry mouth -nausea -tiredness This list may not describe all possible side effects. Call your doctor for medical advice about side effects. You may report side effects to FDA at 1-800-FDA-1088. Where should I keep my medicine? Keep out of the reach of children. This medicine can be abused. Keep your  medicine in a safe place to protect it from theft. Do not share this medicine with anyone. Selling or giving away this medicine is dangerous and against the law. Store at room temperature between 15 and 30 degrees C (59 and 86 degrees F). Throw away any unused medicine after the expiration date. NOTE: This sheet is a summary. It may not cover all possible information. If you have questions about this medicine, talk to your doctor, pharmacist, or health care provider.  2019 Elsevier/Gold Standard (2015-11-15 16:24:54) Bupropion; Naltrexone extended-release tablets What is this medicine? BUPROPION; NALTREXONE (byoo PROE pee on; nal TREX one) is a combination product used to promote and maintain weight loss in obese adults or overweight adults who also have weight related medical problems. This medicine should be used with a reduced calorie diet and increased physical activity. This medicine may be used for other purposes; ask your health care provider or pharmacist if you have questions. COMMON BRAND NAME(S): Contrave What should I tell my health care provider before I take this medicine? They need to know if you have any of these conditions: -an eating disorder, such as anorexia or bulimia -bipolar disorder -diabetes -depression -drug abuse or addiction -glaucoma -head injury -heart disease -high blood pressure -history of a tumor or infection of your brain or spine -history of stroke -history of irregular heartbeat -if you often drink alcohol -kidney disease -liver disease -schizophrenia -seizures -suicidal thoughts, plans, or attempt; a previous suicide attempt by you or a family member -an unusual or allergic reaction to bupropion, naltrexone, other medicines, foods, dyes, or preservatives -breast-feeding -pregnant or trying to become pregnant  How should I use this medicine? Take this medicine by mouth with a glass of water. Follow the directions on the prescription label. Take  this medicine in the morning and in the evenings as directed by your healthcare professional. Dennis Bast can take it with or without food. Do not take with high-fat meals as this may increase your risk of seizures. Do not crush, chew, or cut these tablets. Do not take your medicine more often than directed. Do not stop taking this medicine suddenly except upon the advice of your doctor. A special MedGuide will be given to you by the pharmacist with each prescription and refill. Be sure to read this information carefully each time. Talk to your pediatrician regarding the use of this medicine in children. Special care may be needed. Overdosage: If you think you have taken too much of this medicine contact a poison control center or emergency room at once. NOTE: This medicine is only for you. Do not share this medicine with others. What if I miss a dose? If you miss a dose, skip the missed dose and take your next tablet at the regular time. Do not take double or extra doses. What may interact with this medicine? Do not take this medicine with any of the following medications: -any prescription or street opioid drug like codeine, heroin, methadone -linezolid -MAOIs like Carbex, Eldepryl, Marplan, Nardil, and Parnate -methylene blue (injected into a vein) -other medicines that contain bupropion like Zyban or Wellbutrin This medicine may also interact with the following medications: -alcohol -certain medicines for anxiety or sleep -certain medicines for blood pressure like metoprolol, propranolol -certain medicines for depression or psychotic disturbances -certain medicines for HIV or AIDS like efavirenz, lopinavir, nelfinavir, ritonavir -certain medicines for irregular heart beat like propafenone, flecainide -certain medicines for Parkinson's disease like amantadine, levodopa -certain medicines for seizures like carbamazepine, phenytoin,  phenobarbital -cimetidine -clopidogrel -cyclophosphamide -digoxin -disulfiram -furazolidone -isoniazid -nicotine -orphenadrine -procarbazine -steroid medicines like prednisone or cortisone -stimulant medicines for attention disorders, weight loss, or to stay awake -tamoxifen -theophylline -thioridazine -thiotepa -ticlopidine -tramadol -warfarin This list may not describe all possible interactions. Give your health care provider a list of all the medicines, herbs, non-prescription drugs, or dietary supplements you use. Also tell them if you smoke, drink alcohol, or use illegal drugs. Some items may interact with your medicine. What should I watch for while using this medicine? This medicine is intended to be used in addition to a healthy diet and appropriate exercise. The best results are achieved this way. Do not increase or in any way change your dose without consulting your doctor or health care professional. Do not take this medicine with other prescription or over-the-counter weight loss products without consulting your doctor or health care professional. Your doctor should tell you to stop taking this medicine if you do not lose a certain amount of weight within the first 12 weeks of treatment. Visit your doctor or health care professional for regular checkups. Your doctor may order blood tests or other tests to see how you are doing. This medicine may affect blood sugar levels. If you have diabetes, check with your doctor or health care professional before you change your diet or the dose of your diabetic medicine. Patients and their families should watch out for new or worsening depression or thoughts of suicide. Also watch out for sudden changes in feelings such as feeling anxious, agitated, panicky, irritable, hostile, aggressive, impulsive, severely restless, overly excited and hyperactive, or not being able to  sleep. If this happens, especially at the beginning of treatment or  after a change in dose, call your health care professional. Avoid alcoholic drinks while taking this medicine. Drinking large amounts of alcoholic beverages, using sleeping or anxiety medicines, or quickly stopping the use of these agents while taking this medicine may increase your risk for a seizure. What side effects may I notice from receiving this medicine? Side effects that you should report to your doctor or health care professional as soon as possible: -allergic reactions like skin rash, itching or hives, swelling of the face, lips, or tongue -breathing problems -changes in vision -confusion -elevated mood, decreased need for sleep, racing thoughts, impulsive behavior -fast or irregular heartbeat -hallucinations, loss of contact with reality -increased blood pressure -redness, blistering, peeling or loosening of the skin, including inside the mouth -seizures -signs and symptoms of liver injury like dark yellow or brown urine; general ill feeling or flu-like symptoms; light-colored stools; loss of appetite; nausea; right upper belly pain; unusually weak or tired; yellowing of the eyes or skin -suicidal thoughts or other mood changes -vomiting Side effects that usually do not require medical attention (report to your doctor or health care professional if they continue or are bothersome): -constipation -headache -loss of appetite -indigestion, stomach upset -tremors This list may not describe all possible side effects. Call your doctor for medical advice about side effects. You may report side effects to FDA at 1-800-FDA-1088. Where should I keep my medicine? Keep out of the reach of children. Store at room temperature between 15 and 30 degrees C (59 and 86 degrees F). Throw away any unused medicine after the expiration date. NOTE: This sheet is a summary. It may not cover all possible information. If you have questions about this medicine, talk to your doctor, pharmacist, or health  care provider.  2019 Elsevier/Gold Standard (2016-04-05 13:42:58) Liraglutide injection (Weight Management) What is this medicine? LIRAGLUTIDE (LIR a GLOO tide) is used with a reduced calorie diet and exercise to help you lose weight. This medicine may be used for other purposes; ask your health care provider or pharmacist if you have questions. COMMON BRAND NAME(S): Saxenda What should I tell my health care provider before I take this medicine? They need to know if you have any of these conditions: -endocrine tumors (MEN 2) or if someone in your family had these tumors -gallbladder disease -high cholesterol -history of alcohol abuse problem -history of pancreatitis -kidney disease or if you are on dialysis -liver disease -previous swelling of the tongue, face, or lips with difficulty breathing, difficulty swallowing, hoarseness, or tightening of the throat -stomach problems -suicidal thoughts, plans, or attempt; a previous suicide attempt by you or a family member -thyroid cancer or if someone in your family had thyroid cancer -an unusual or allergic reaction to liraglutide, other medicines, foods, dyes, or preservatives -pregnant or trying to get pregnant -breast-feeding How should I use this medicine? This medicine is for injection under the skin of your upper leg, stomach area, or upper arm. You will be taught how to prepare and give this medicine. Use exactly as directed. Take your medicine at regular intervals. Do not take it more often than directed. It is important that you put your used needles and syringes in a special sharps container. Do not put them in a trash can. If you do not have a sharps container, call your pharmacist or healthcare provider to get one. A special MedGuide will be given to you by the pharmacist  with each prescription and refill. Be sure to read this information carefully each time. Talk to your pediatrician regarding the use of this medicine in children.  Special care may be needed. Overdosage: If you think you have taken too much of this medicine contact a poison control center or emergency room at once. NOTE: This medicine is only for you. Do not share this medicine with others. What if I miss a dose? If you miss a dose, take it as soon as you can. If it is almost time for your next dose, take only that dose. Do not take double or extra doses. If you miss your dose for 3 days or more, call your doctor or health care professional to talk about how to restart this medicine. What may interact with this medicine? -insulin and other medicines for diabetes This list may not describe all possible interactions. Give your health care provider a list of all the medicines, herbs, non-prescription drugs, or dietary supplements you use. Also tell them if you smoke, drink alcohol, or use illegal drugs. Some items may interact with your medicine. What should I watch for while using this medicine? Visit your doctor or health care professional for regular checks on your progress. This medicine is intended to be used in addition to a healthy diet and appropriate exercise. The best results are achieved this way. Do not increase or in any way change your dose without consulting your doctor or health care professional. Drink plenty of fluids while taking this medicine. Check with your doctor or health care professional if you get an attack of severe diarrhea, nausea, and vomiting. The loss of too much body fluid can make it dangerous for you to take this medicine. This medicine may affect blood sugar levels. If you have diabetes, check with your doctor or health care professional before you change your diet or the dose of your diabetic medicine. Patients and their families should watch out for worsening depression or thoughts of suicide. Also watch out for sudden changes in feelings such as feeling anxious, agitated, panicky, irritable, hostile, aggressive, impulsive,  severely restless, overly excited and hyperactive, or not being able to sleep. If this happens, especially at the beginning of treatment or after a change in dose, call your health care professional. What side effects may I notice from receiving this medicine? Side effects that you should report to your doctor or health care professional as soon as possible: -allergic reactions like skin rash, itching or hives, swelling of the face, lips, or tongue -breathing problems -diarrhea that continues or is severe -lump or swelling on the neck -severe nausea -signs and symptoms of infection like fever or chills; cough; sore throat; pain or trouble passing urine -signs and symptoms of low blood sugar such as feeling anxious, confusion, dizziness, increased hunger, unusually weak or tired, sweating, shakiness, cold, irritable, headache, blurred vision, fast heartbeat, loss of consciousness -signs and symptoms of kidney injury like trouble passing urine or change in the amount of urine -trouble swallowing -unusual stomach upset or pain -vomiting Side effects that usually do not require medical attention (report to your doctor or health care professional if they continue or are bothersome): -constipation -decreased appetite -diarrhea -fatigue -headache -nausea -pain, redness, or irritation at site where injected -stomach upset -stuffy or runny nose This list may not describe all possible side effects. Call your doctor for medical advice about side effects. You may report side effects to FDA at 1-800-FDA-1088. Where should I keep my medicine?  Keep out of the reach of children. Store unopened pen in a refrigerator between 2 and 8 degrees C (36 and 46 degrees F). Do not freeze or use if the medicine has been frozen. Protect from light and excessive heat. After you first use the pen, it can be stored at room temperature between 15 and 30 degrees C (59 and 86 degrees F) or in a refrigerator. Throw away your  used pen after 30 days or after the expiration date, whichever comes first. Do not store your pen with the needle attached. If the needle is left on, medicine may leak from the pen. NOTE: This sheet is a summary. It may not cover all possible information. If you have questions about this medicine, talk to your doctor, pharmacist, or health care provider.  2019 Elsevier/Gold Standard (2016-10-31 14:41:37)

## 2019-03-31 DIAGNOSIS — E669 Obesity, unspecified: Secondary | ICD-10-CM | POA: Insufficient documentation

## 2019-03-31 NOTE — Assessment & Plan Note (Signed)
Discussed weight loss medication. Options given today to consider. Will call if she decides she wants to start one. Call with any concerns.

## 2019-05-12 ENCOUNTER — Encounter: Payer: Self-pay | Admitting: Family

## 2019-05-13 ENCOUNTER — Inpatient Hospital Stay: Payer: BC Managed Care – PPO | Attending: Hematology & Oncology | Admitting: Family

## 2019-05-13 ENCOUNTER — Inpatient Hospital Stay: Payer: BC Managed Care – PPO

## 2019-05-13 ENCOUNTER — Encounter: Payer: Self-pay | Admitting: Family

## 2019-05-13 ENCOUNTER — Other Ambulatory Visit: Payer: Self-pay

## 2019-05-13 VITALS — BP 129/59 | HR 93 | Temp 97.5°F | Wt 186.0 lb

## 2019-05-13 DIAGNOSIS — Z79899 Other long term (current) drug therapy: Secondary | ICD-10-CM | POA: Insufficient documentation

## 2019-05-13 DIAGNOSIS — N920 Excessive and frequent menstruation with regular cycle: Secondary | ICD-10-CM | POA: Insufficient documentation

## 2019-05-13 DIAGNOSIS — N921 Excessive and frequent menstruation with irregular cycle: Secondary | ICD-10-CM

## 2019-05-13 DIAGNOSIS — Z7951 Long term (current) use of inhaled steroids: Secondary | ICD-10-CM | POA: Insufficient documentation

## 2019-05-13 DIAGNOSIS — R5383 Other fatigue: Secondary | ICD-10-CM | POA: Insufficient documentation

## 2019-05-13 DIAGNOSIS — D500 Iron deficiency anemia secondary to blood loss (chronic): Secondary | ICD-10-CM

## 2019-05-13 DIAGNOSIS — K909 Intestinal malabsorption, unspecified: Secondary | ICD-10-CM

## 2019-05-13 DIAGNOSIS — D509 Iron deficiency anemia, unspecified: Secondary | ICD-10-CM

## 2019-05-13 DIAGNOSIS — R51 Headache: Secondary | ICD-10-CM | POA: Insufficient documentation

## 2019-05-13 DIAGNOSIS — O99019 Anemia complicating pregnancy, unspecified trimester: Secondary | ICD-10-CM

## 2019-05-13 LAB — CBC WITH DIFFERENTIAL (CANCER CENTER ONLY)
Abs Immature Granulocytes: 0.06 10*3/uL (ref 0.00–0.07)
Basophils Absolute: 0 10*3/uL (ref 0.0–0.1)
Basophils Relative: 1 %
Eosinophils Absolute: 0.2 10*3/uL (ref 0.0–0.5)
Eosinophils Relative: 2 %
HCT: 42.7 % (ref 36.0–46.0)
Hemoglobin: 14.5 g/dL (ref 12.0–15.0)
Immature Granulocytes: 1 %
Lymphocytes Relative: 29 %
Lymphs Abs: 2.3 10*3/uL (ref 0.7–4.0)
MCH: 32.6 pg (ref 26.0–34.0)
MCHC: 34 g/dL (ref 30.0–36.0)
MCV: 96 fL (ref 80.0–100.0)
Monocytes Absolute: 0.5 10*3/uL (ref 0.1–1.0)
Monocytes Relative: 6 %
Neutro Abs: 5.1 10*3/uL (ref 1.7–7.7)
Neutrophils Relative %: 61 %
Platelet Count: 372 10*3/uL (ref 150–400)
RBC: 4.45 MIL/uL (ref 3.87–5.11)
RDW: 11.9 % (ref 11.5–15.5)
WBC Count: 8.2 10*3/uL (ref 4.0–10.5)
nRBC: 0 % (ref 0.0–0.2)

## 2019-05-13 LAB — RETICULOCYTES
Immature Retic Fract: 7.1 % (ref 2.3–15.9)
RBC.: 4.46 MIL/uL (ref 3.87–5.11)
Retic Count, Absolute: 95 10*3/uL (ref 19.0–186.0)
Retic Ct Pct: 2.1 % (ref 0.4–3.1)

## 2019-05-13 LAB — CMP (CANCER CENTER ONLY)
ALT: 24 U/L (ref 0–44)
AST: 15 U/L (ref 15–41)
Albumin: 4.8 g/dL (ref 3.5–5.0)
Alkaline Phosphatase: 71 U/L (ref 38–126)
Anion gap: 11 (ref 5–15)
BUN: 7 mg/dL (ref 6–20)
CO2: 25 mmol/L (ref 22–32)
Calcium: 9.1 mg/dL (ref 8.9–10.3)
Chloride: 104 mmol/L (ref 98–111)
Creatinine: 0.76 mg/dL (ref 0.44–1.00)
GFR, Est AFR Am: >60 mL/min (ref >60)
GFR, Estimated: >60 mL/min (ref >60)
Glucose, Bld: 98 mg/dL (ref 70–99)
Potassium: 3.8 mmol/L (ref 3.5–5.1)
Sodium: 140 mmol/L (ref 135–145)
Total Bilirubin: 0.4 mg/dL (ref 0.3–1.2)
Total Protein: 7.6 g/dL (ref 6.5–8.1)

## 2019-05-13 NOTE — Progress Notes (Signed)
Hematology and Oncology Follow Up Visit  Karla Huber [id number] 8/2[name]4/1987 33 y.o. 05/13/2019   Principle Diagnosis:  Iron deficiency anemia  Current Therapy:   IV iron (Injectafer) as indicated    Interim History:  Karla Huber is here today for follow-up. She is doing well but has noted fatigued and headaches recently.  She changed birth control and has had a heavy cycle the last 2 months.  No other bleeding, no bruising or petechiae.  She has not had any fever, chills, n/v, cough, rash, dizziness, chest pain, palpitations, abdominal pain or changes in bowel or bladder habits.  She will occasionally get a little winded with exertion which she attributes to asthma.  No swelling, tenderness, numbness or tingling in her extremities.  She is eating well and staying hydrated. Her weight is stable.  She is exercising regularly.   ECOG Performance Status: 1 - Symptomatic but completely ambulatory  Medications:  Allergies as of 05/13/2019      Reactions   Amoxicillin Hives   Has patient had a PCN reaction causing immediate rash, facial/tongue/throat swelling, SOB or lightheadedness with hypotension: {no Has patient had a PCN reaction causing severe rash involving mucus membranes or skin necrosis: {no Has patient had a PCN reaction that required hospitalization no Has patient had a PCN reaction occurring within the last 10 years: {yes If all of the above answers are "NO", then may proceed with Cephalosporin use.   Hydrocodone Hives   Tdap [tetanus-diphth-acell Pertussis] Other (See Comments)   Fever, Joint stiffness      Medication List       Accurate as of May 13, 2019  1:07 PM. If you have any questions, ask your nurse or doctor.        albuterol 108 (90 Base) MCG/ACT inhaler Commonly known as: VENTOLIN HFA Use 2 puffs every four hours as needed for cough or wheeze.  May use 2 puffs 10-20 minutes prior to exercise.   fluticasone 110 MCG/ACT inhaler Commonly known as:  Flovent HFA Inhale 2 puffs into the lungs 2 (two) times daily.   montelukast 10 MG tablet Commonly known as: SINGULAIR TAKE 1 TABLET BY MOUTH EVERYDAY AT BEDTIME   omeprazole 20 MG tablet Commonly known as: PRILOSEC OTC Take 20 mg by mouth daily.   PRENATAL VITAMIN PO Take by mouth.   Rhinocort Allergy 32 MCG/ACT nasal spray Generic drug: budesonide Place 2 sprays into both nostrils daily.   Tri-Lo-Marzia 0.18/0.215/0.25 MG-25 MCG tab Generic drug: Norgestimate-Ethinyl Estradiol Triphasic Take 1 tablet by mouth daily.       Allergies:  Allergies  Allergen Reactions  . Amoxicillin Hives    Has patient had a PCN reaction causing immediate rash, facial/tongue/throat swelling, SOB or lightheadedness with hypotension: {no Has patient had a PCN reaction causing severe rash involving mucus membranes or skin necrosis: {no Has patient had a PCN reaction that required hospitalization no Has patient had a PCN reaction occurring within the last 10 years: {yes If all of the above answers are "NO", then may proceed with Cephalosporin use.  Marland Kitchen. Hydrocodone Hives  . Tdap [Tetanus-Diphth-Acell Pertussis] Other (See Comments)    Fever, Joint stiffness    Past Medical History, Surgical history, Social history, and Family History were reviewed and updated.  Review of Systems: All other 10 point review of systems is negative.   Physical Exam:  vitals were not taken for this visit.   Wt Readings from Last 3 Encounters:  03/30/19 185 lb (83.9 kg)  01/07/19  185 lb 4 oz (84 kg)  12/07/18 182 lb 6.4 oz (82.7 kg)    Ocular: Sclerae unicteric, pupils equal, round and reactive to light Ear-nose-throat: Oropharynx clear, dentition fair Lymphatic: No cervical or supraclavicular adenopathy Lungs no rales or rhonchi, good excursion bilaterally Heart regular rate and rhythm, no murmur appreciated Abd soft, nontender, positive bowel sounds, no liver or spleen tip palpated on exam, no fluid wave   MSK no focal spinal tenderness, no joint edema Neuro: non-focal, well-oriented, appropriate affect Breasts: Deferred   Lab Results  Component Value Date   WBC 8.2 05/13/2019   HGB 14.5 05/13/2019   HCT 42.7 05/13/2019   MCV 96.0 05/13/2019   PLT 372 05/13/2019   Lab Results  Component Value Date   FERRITIN 349 (H) 01/07/2019   IRON 110 01/07/2019   TIBC 395 01/07/2019   UIBC 285 01/07/2019   IRONPCTSAT 28 01/07/2019   Lab Results  Component Value Date   RETICCTPCT 2.1 05/13/2019   RBC 4.46 05/13/2019   RBC 4.45 05/13/2019   No results found for: KPAFRELGTCHN, LAMBDASER, KAPLAMBRATIO No results found for: IGGSERUM, IGA, IGMSERUM No results found for: Odetta Pink, SPEI   Chemistry      Component Value Date/Time   NA 140 09/17/2018 0920   K 4.4 09/17/2018 0920   CL 105 09/17/2018 0920   CO2 20 09/17/2018 0920   BUN 10 09/17/2018 0920   CREATININE 0.75 09/17/2018 0920   CREATININE 0.80 12/08/2017 1003      Component Value Date/Time   CALCIUM 9.7 09/17/2018 0920   ALKPHOS 72 09/17/2018 0920   AST 20 09/17/2018 0920   AST 20 12/08/2017 1003   ALT 47 (H) 09/17/2018 0920   ALT 18 12/08/2017 1003   BILITOT 0.4 09/17/2018 0920   BILITOT 0.5 12/08/2017 1003       Impression and Plan: Karla Huber is a very pleasant 33 y/o caucasian female with a history of iron deficiency anemia due to heavy cycles. We will see what her iron studies show and bring her back in for infusion if needed.  We will plan to see her back in another 4 months.  She will contact our office with any questions or concerns. We can certainly see her sooner if needed.   Laverna Peace, NP 7/16/20201:07 PM

## 2019-05-14 ENCOUNTER — Telehealth: Payer: Self-pay | Admitting: Hematology & Oncology

## 2019-05-14 LAB — FERRITIN: Ferritin: 328 ng/mL — ABNORMAL HIGH (ref 11–307)

## 2019-05-14 LAB — IRON AND TIBC
Iron: 67 ug/dL (ref 41–142)
Saturation Ratios: 16 % — ABNORMAL LOW (ref 21–57)
TIBC: 408 ug/dL (ref 236–444)
UIBC: 341 ug/dL (ref 120–384)

## 2019-05-14 NOTE — Telephone Encounter (Signed)
Appointment scheduled for Iron infusion per 7/17 result note. Patient is ok with date/time

## 2019-05-20 ENCOUNTER — Inpatient Hospital Stay: Payer: BC Managed Care – PPO

## 2019-05-20 ENCOUNTER — Other Ambulatory Visit: Payer: Self-pay

## 2019-05-20 VITALS — BP 117/59 | HR 94 | Temp 97.7°F | Resp 18

## 2019-05-20 DIAGNOSIS — N921 Excessive and frequent menstruation with irregular cycle: Secondary | ICD-10-CM

## 2019-05-20 DIAGNOSIS — N920 Excessive and frequent menstruation with regular cycle: Secondary | ICD-10-CM | POA: Diagnosis not present

## 2019-05-20 DIAGNOSIS — D62 Acute posthemorrhagic anemia: Secondary | ICD-10-CM

## 2019-05-20 DIAGNOSIS — R51 Headache: Secondary | ICD-10-CM | POA: Diagnosis not present

## 2019-05-20 DIAGNOSIS — D500 Iron deficiency anemia secondary to blood loss (chronic): Secondary | ICD-10-CM | POA: Diagnosis not present

## 2019-05-20 DIAGNOSIS — R5383 Other fatigue: Secondary | ICD-10-CM | POA: Diagnosis not present

## 2019-05-20 DIAGNOSIS — K909 Intestinal malabsorption, unspecified: Secondary | ICD-10-CM

## 2019-05-20 DIAGNOSIS — Z7951 Long term (current) use of inhaled steroids: Secondary | ICD-10-CM | POA: Diagnosis not present

## 2019-05-20 DIAGNOSIS — Z79899 Other long term (current) drug therapy: Secondary | ICD-10-CM | POA: Diagnosis not present

## 2019-05-20 MED ORDER — SODIUM CHLORIDE 0.9 % IV SOLN
510.0000 mg | Freq: Once | INTRAVENOUS | Status: AC
  Administered 2019-05-20: 510 mg via INTRAVENOUS
  Filled 2019-05-20: qty 510

## 2019-05-20 MED ORDER — SODIUM CHLORIDE 0.9 % IV SOLN
Freq: Once | INTRAVENOUS | Status: AC
  Administered 2019-05-20: 14:00:00 via INTRAVENOUS
  Filled 2019-05-20: qty 250

## 2019-05-20 NOTE — Patient Instructions (Signed)

## 2019-05-31 DIAGNOSIS — Z792 Long term (current) use of antibiotics: Secondary | ICD-10-CM | POA: Diagnosis not present

## 2019-05-31 DIAGNOSIS — N390 Urinary tract infection, site not specified: Secondary | ICD-10-CM | POA: Diagnosis not present

## 2019-05-31 DIAGNOSIS — R300 Dysuria: Secondary | ICD-10-CM | POA: Diagnosis not present

## 2019-05-31 DIAGNOSIS — R319 Hematuria, unspecified: Secondary | ICD-10-CM | POA: Diagnosis not present

## 2019-08-02 ENCOUNTER — Encounter: Payer: Self-pay | Admitting: Family Medicine

## 2019-08-06 ENCOUNTER — Ambulatory Visit: Payer: BC Managed Care – PPO | Admitting: Family Medicine

## 2019-08-22 NOTE — Progress Notes (Signed)
BP 108/75   Pulse 93   Temp 99.3 F (37.4 C) (Oral)   Ht 5\' 3"  (1.6 m)   Wt 183 lb (83 kg)   SpO2 97%   BMI 32.42 kg/m    Subjective:    Patient ID: Karla Huber, female    DOB: 10/21/1986, 33 y.o.   MRN: [id number]  HPI: Karla Huber is a 33 y.o. female  Chief Complaint  Patient presents with  . Neck Pain    OMM   Karla Huber presents today for evaluation and possible treatment with OMT for her neck pain. She states that she did well following her last appointment. She notes that she is not feeling well today. Her neck has been hurting a lot more. Mainly on the R side. Has been going on for a couple of weeks. It is tight and sore and aching. It has been radiating into her head and her jaw. She has been having really bad headaches- seem to be on day 2-4 of her period. Nothing really makes it better. She has tried tylenol, ibprofen, tens unit, heat, ice, stretching- and nothing really helps. Worse with stress and certain positions. She also notes that the L side of her back has been hurting for about a week. She bent down to pick up a pair of pants and it started hurting. It's tight and sore in nature. Better with a flexeril. Worse with carrying her son. No radiation. She is otherwise feeling well with no other concerns or complaints at this time.   Relevant past medical, surgical, family and social history reviewed and updated as indicated. Interim medical history since our last visit reviewed. Allergies and medications reviewed and updated.  Review of Systems  Constitutional: Negative.   Respiratory: Negative.   Cardiovascular: Negative.   Musculoskeletal: Positive for back pain, myalgias, neck pain and neck stiffness. Negative for arthralgias, gait problem and joint swelling.  Skin: Negative.   Neurological: Positive for headaches. Negative for dizziness, tremors, seizures, syncope, facial asymmetry, speech difficulty, weakness, light-headedness and numbness.  Hematological:  Negative.   Psychiatric/Behavioral: Negative.     Per HPI unless specifically indicated above     Objective:    BP 108/75   Pulse 93   Temp 99.3 F (37.4 C) (Oral)   Ht 5\' 3"  (1.6 m)   Wt 183 lb (83 kg)   SpO2 97%   BMI 32.42 kg/m   Wt Readings from Last 3 Encounters:  08/23/19 183 lb (83 kg)  05/13/19 186 lb (84.4 kg)  03/30/19 185 lb (83.9 kg)    Physical Exam Vitals signs and nursing note reviewed.  Constitutional:      General: She is not in acute distress.    Appearance: Normal appearance. She is not ill-appearing.  HENT:     Head: Normocephalic and atraumatic.     Right Ear: External ear normal.     Left Ear: External ear normal.     Nose: Nose normal.     Mouth/Throat:     Mouth: Mucous membranes are moist.     Pharynx: Oropharynx is clear.  Eyes:     Extraocular Movements: Extraocular movements intact.     Conjunctiva/sclera: Conjunctivae normal.     Pupils: Pupils are equal, round, and reactive to light.  Neck:     Musculoskeletal: No muscular tenderness.     Vascular: No carotid bruit.  Cardiovascular:     Rate and Rhythm: Normal rate.     Pulses: Normal pulses.  Pulmonary:  Effort: Pulmonary effort is normal. No respiratory distress.  Abdominal:     General: Abdomen is flat. There is no distension.     Palpations: Abdomen is soft. There is no mass.     Tenderness: There is no abdominal tenderness. There is no right CVA tenderness, left CVA tenderness, guarding or rebound.     Hernia: No hernia is present.  Lymphadenopathy:     Cervical: No cervical adenopathy.  Skin:    General: Skin is warm and dry.     Capillary Refill: Capillary refill takes less than 2 seconds.     Coloration: Skin is not jaundiced or pale.     Findings: No bruising, erythema, lesion or rash.  Neurological:     General: No focal deficit present.     Mental Status: She is alert. Mental status is at baseline.  Psychiatric:        Mood and Affect: Mood normal.         Behavior: Behavior normal.        Thought Content: Thought content normal.        Judgment: Judgment normal.    Musculoskeletal:  Exam found Decreased ROM, Tissue texture changes, Tenderness to palpation and Asymmetry of patient's  head, neck, thorax, ribs, lumbar, pelvis, sacrum and abdomen Osteopathic Structural Exam:   Head: OAESSR, hypertonic suboccipital muscles, temporal anterior and inferior on the R, R SBS torsion  Neck: SCM hypertonic on the R, C3-4ESRR, C5ESRL  Thorax: L2-5SLRR  Ribs: Ribs 5-8 locked up on the L  Lumbar: QL hypertonic on the L, psoas hypertonic on the L  Pelvis: Anterior R innominate  Sacrum: L on L torsion  Abdomen: diaphragm hypertonic on the L with facial strain through the back into the QL and psoas  Results for orders placed or performed in visit on 05/13/19  Iron and TIBC  Result Value Ref Range   Iron 67 41 - 142 ug/dL   TIBC 347408 425236 - 956444 ug/dL   Saturation Ratios 16 (L) 21 - 57 %   UIBC 341 120 - 384 ug/dL  Reticulocytes  Result Value Ref Range   Retic Ct Pct 2.1 0.4 - 3.1 %   RBC. 4.46 3.87 - 5.11 MIL/uL   Retic Count, Absolute 95.0 19.0 - 186.0 K/uL   Immature Retic Fract 7.1 2.3 - 15.9 %  Ferritin  Result Value Ref Range   Ferritin 328 (H) 11 - 307 ng/mL  CMP (Can[facility] Center only)  Result Value Ref Range   Sodium 140 135 - 145 mmol/L   Potassium 3.8 3.5 - 5.1 mmol/L   Chloride 104 98 - 111 mmol/L   CO2 25 22 - 32 mmol/L   Glucose, Bld 98 70 - 99 mg/dL   BUN 7 6 - 20 mg/dL   Creatinine 3.870.76 5.640.44 - 1.00 mg/dL   Calcium 9.1 8.9 - 33.210.3 mg/dL   Total Protein 7.6 6.5 - 8.1 g/dL   Albumin 4.8 3.5 - 5.0 g/dL   AST 15 15 - 41 U/L   ALT 24 0 - 44 U/L   Alkaline Phosphatase 71 38 - 126 U/L   Total Bilirubin 0.4 0.3 - 1.2 mg/dL   GFR, Est Non Af Am >95>60 >60 mL/min   GFR, Est AFR Am >60 >60 mL/min   Anion gap 11 5 - 15  CBC with Differential (Can[facility] Center Only)  Result Value Ref Range   WBC Count 8.2 4.0 - 10.5 K/uL   RBC 4.45 3.87 - 5.11  MIL/uL   Hemoglobin 14.5 12.0 - 15.0 g/dL   HCT 41.9 62.2 - 29.7 %   MCV 96.0 80.0 - 100.0 fL   MCH 32.6 26.0 - 34.0 pg   MCHC 34.0 30.0 - 36.0 g/dL   RDW 98.9 21.1 - 94.1 %   Platelet Count 372 150 - 400 K/uL   nRBC 0.0 0.0 - 0.2 %   Neutrophils Relative % 61 %   Neutro Abs 5.1 1.7 - 7.7 K/uL   Lymphocytes Relative 29 %   Lymphs Abs 2.3 0.7 - 4.0 K/uL   Monocytes Relative 6 %   Monocytes Absolute 0.5 0.1 - 1.0 K/uL   Eosinophils Relative 2 %   Eosinophils Absolute 0.2 0.0 - 0.5 K/uL   Basophils Relative 1 %   Basophils Absolute 0.0 0.0 - 0.1 K/uL   Immature Granulocytes 1 %   Abs Immature Granulocytes 0.06 0.00 - 0.07 K/uL      Assessment & Plan:   Problem List Items Addressed This Visit    None    Visit Diagnoses    Flu vaccine need    -  Primary   Flu shot given today.   Relevant Orders   Flu Vaccine QUAD 36+ mos IM (Completed)   Neck pain       In acute exacerbation. She does appear to have some somatic dysfunction that is contributing to her symptoms. Treated today with good results as below.    Acute left-sided low back pain without sciatica       In acute exacerbation. She does appear to have some somatic dysfunction that is contributing to her symptoms. Treated today with good results as below.    Somatic dysfunction of sacral region       Somatic dysfunction of pelvis region       Somatic dysfunction of rib region       Thoracic segment dysfunction       Somatic dysfunction of lumbar region       Head region somatic dysfunction       Cervical (neck) region somatic dysfunction       Segmental dysfunction of abdomen         After verbal consent was obtained, patient was treated today with osteopathic manipulative medicine to the regions of the head, neck, thorax, ribs, lumbar, pelvis, sacrum and abdomen using the techniques of cranial, myofascial release, counterstrain, muscle energy, HVLA and soft tissue. Areas of compensation relating to her primary pain source  also treated. Patient tolerated the procedure well with good objective and good subjective improvement in symptoms. She left the room in good condition. She was advised to stay well hydrated and that she may have some soreness following the procedure. She will return for reevaluation  In 3-4 weeks if needed.   Follow up plan: Return in about 4 weeks (around 09/20/2019).

## 2019-08-23 ENCOUNTER — Ambulatory Visit (INDEPENDENT_AMBULATORY_CARE_PROVIDER_SITE_OTHER): Payer: BC Managed Care – PPO | Admitting: Family Medicine

## 2019-08-23 ENCOUNTER — Other Ambulatory Visit: Payer: Self-pay

## 2019-08-23 ENCOUNTER — Encounter: Payer: Self-pay | Admitting: Family Medicine

## 2019-08-23 VITALS — BP 108/75 | HR 93 | Temp 99.3°F | Ht 63.0 in | Wt 183.0 lb

## 2019-08-23 DIAGNOSIS — M9901 Segmental and somatic dysfunction of cervical region: Secondary | ICD-10-CM

## 2019-08-23 DIAGNOSIS — M545 Low back pain, unspecified: Secondary | ICD-10-CM

## 2019-08-23 DIAGNOSIS — M9905 Segmental and somatic dysfunction of pelvic region: Secondary | ICD-10-CM

## 2019-08-23 DIAGNOSIS — M9903 Segmental and somatic dysfunction of lumbar region: Secondary | ICD-10-CM

## 2019-08-23 DIAGNOSIS — M542 Cervicalgia: Secondary | ICD-10-CM

## 2019-08-23 DIAGNOSIS — M9904 Segmental and somatic dysfunction of sacral region: Secondary | ICD-10-CM | POA: Diagnosis not present

## 2019-08-23 DIAGNOSIS — Z23 Encounter for immunization: Secondary | ICD-10-CM

## 2019-08-23 DIAGNOSIS — M9900 Segmental and somatic dysfunction of head region: Secondary | ICD-10-CM | POA: Diagnosis not present

## 2019-08-23 DIAGNOSIS — M9909 Segmental and somatic dysfunction of abdomen and other regions: Secondary | ICD-10-CM

## 2019-08-23 DIAGNOSIS — M9902 Segmental and somatic dysfunction of thoracic region: Secondary | ICD-10-CM

## 2019-08-23 DIAGNOSIS — M9908 Segmental and somatic dysfunction of rib cage: Secondary | ICD-10-CM

## 2019-08-23 MED ORDER — FLOVENT HFA 110 MCG/ACT IN AERO: 2.0000 | INHALATION_SPRAY | Freq: Two times a day (BID) | RESPIRATORY_TRACT | 3 refills | Status: DC

## 2019-09-15 ENCOUNTER — Other Ambulatory Visit: Payer: Self-pay

## 2019-09-15 ENCOUNTER — Inpatient Hospital Stay: Payer: BC Managed Care – PPO | Attending: Hematology & Oncology

## 2019-09-15 ENCOUNTER — Inpatient Hospital Stay (HOSPITAL_BASED_OUTPATIENT_CLINIC_OR_DEPARTMENT_OTHER): Payer: BC Managed Care – PPO | Admitting: Family

## 2019-09-15 VITALS — BP 110/58 | HR 72 | Temp 97.3°F | Wt 184.8 lb

## 2019-09-15 DIAGNOSIS — Z7951 Long term (current) use of inhaled steroids: Secondary | ICD-10-CM | POA: Diagnosis not present

## 2019-09-15 DIAGNOSIS — R519 Headache, unspecified: Secondary | ICD-10-CM | POA: Diagnosis not present

## 2019-09-15 DIAGNOSIS — R5383 Other fatigue: Secondary | ICD-10-CM | POA: Insufficient documentation

## 2019-09-15 DIAGNOSIS — Z79899 Other long term (current) drug therapy: Secondary | ICD-10-CM | POA: Insufficient documentation

## 2019-09-15 DIAGNOSIS — R200 Anesthesia of skin: Secondary | ICD-10-CM | POA: Diagnosis not present

## 2019-09-15 DIAGNOSIS — D500 Iron deficiency anemia secondary to blood loss (chronic): Secondary | ICD-10-CM

## 2019-09-15 DIAGNOSIS — K909 Intestinal malabsorption, unspecified: Secondary | ICD-10-CM

## 2019-09-15 DIAGNOSIS — D509 Iron deficiency anemia, unspecified: Secondary | ICD-10-CM | POA: Diagnosis not present

## 2019-09-15 DIAGNOSIS — R202 Paresthesia of skin: Secondary | ICD-10-CM | POA: Diagnosis not present

## 2019-09-15 DIAGNOSIS — N921 Excessive and frequent menstruation with irregular cycle: Secondary | ICD-10-CM

## 2019-09-15 LAB — CBC WITH DIFFERENTIAL (CANCER CENTER ONLY)
Abs Immature Granulocytes: 0.01 10*3/uL (ref 0.00–0.07)
Basophils Absolute: 0.1 10*3/uL (ref 0.0–0.1)
Basophils Relative: 1 %
Eosinophils Absolute: 0.2 10*3/uL (ref 0.0–0.5)
Eosinophils Relative: 2 %
HCT: 39.9 % (ref 36.0–46.0)
Hemoglobin: 13.8 g/dL (ref 12.0–15.0)
Immature Granulocytes: 0 %
Lymphocytes Relative: 25 %
Lymphs Abs: 2.5 10*3/uL (ref 0.7–4.0)
MCH: 32.5 pg (ref 26.0–34.0)
MCHC: 34.6 g/dL (ref 30.0–36.0)
MCV: 94.1 fL (ref 80.0–100.0)
Monocytes Absolute: 0.7 10*3/uL (ref 0.1–1.0)
Monocytes Relative: 7 %
Neutro Abs: 6.6 10*3/uL (ref 1.7–7.7)
Neutrophils Relative %: 65 %
Platelet Count: 353 10*3/uL (ref 150–400)
RBC: 4.24 MIL/uL (ref 3.87–5.11)
RDW: 11.6 % (ref 11.5–15.5)
WBC Count: 9.9 10*3/uL (ref 4.0–10.5)
nRBC: 0 % (ref 0.0–0.2)

## 2019-09-15 LAB — RETICULOCYTES
Immature Retic Fract: 7.6 % (ref 2.3–15.9)
RBC.: 4.13 MIL/uL (ref 3.87–5.11)
Retic Count, Absolute: 124.3 10*3/uL (ref 19.0–186.0)
Retic Ct Pct: 3 % (ref 0.4–3.1)

## 2019-09-15 NOTE — Progress Notes (Addendum)
Hematology and Oncology Follow Up Visit  Karla Huber [id number] February 15, 1986 33 y.o. 09/15/2019   Principle Diagnosis:  Iron deficiency anemia  Current Therapy:   IV iron (Injectafer) as indicated    Interim History:  Karla Huber is here today for follow-up. She is doing well but has intermittent headaches. No changes in or loss of vision.  She has fatigue at times.  She states that her cycle is now regular and light. No other blood loss noted. She is taking an oral iron supplement daily.  No issues with infection. No fever, chills, n/v, cough, dizziness, SOB, chest pain, palpitations, abdominal pain or changes in bowel or bladder habits.  No swelling or tenderness in her extremities at this time.  She has chronic lower back issues and will sometimes have numbness and tingling in her toes when she is on her feet for an extended period of time.   She has maintained a good appetite and is staying well hydrated. Her weight is stable.   ECOG Performance Status: 1 - Symptomatic but completely ambulatory  Medications:  Allergies as of 09/15/2019      Reactions   Amoxicillin Hives   Has patient had a PCN reaction causing immediate rash, facial/tongue/throat swelling, SOB or lightheadedness with hypotension: {no Has patient had a PCN reaction causing severe rash involving mucus membranes or skin necrosis: {no Has patient had a PCN reaction that required hospitalization no Has patient had a PCN reaction occurring within the last 10 years: {yes If all of the above answers are "NO", then may proceed with Cephalosporin use.   Hydrocodone Hives   Tdap [tetanus-diphth-acell Pertussis] Other (See Comments)   Fever, Joint stiffness      Medication List       Accurate as of September 15, 2019  1:35 PM. If you have any questions, ask your nurse or doctor.        albuterol 108 (90 Base) MCG/ACT inhaler Commonly known as: VENTOLIN HFA Use 2 puffs every four hours as needed for cough or  wheeze.  May use 2 puffs 10-20 minutes prior to exercise.   cetirizine 10 MG tablet Commonly known as: ZYRTEC Take 10 mg by mouth daily.   Flovent HFA 110 MCG/ACT inhaler Generic drug: fluticasone Inhale 2 puffs into the lungs 2 (two) times daily.   montelukast 10 MG tablet Commonly known as: SINGULAIR TAKE 1 TABLET BY MOUTH EVERYDAY AT BEDTIME   omeprazole 20 MG tablet Commonly known as: PRILOSEC OTC Take 20 mg by mouth daily.   PRENATAL VITAMIN PO Take by mouth.   Rhinocort Allergy 32 MCG/ACT nasal spray Generic drug: budesonide Place 2 sprays into both nostrils daily.   Tri-Lo-Marzia 0.18/0.215/0.25 MG-25 MCG tab Generic drug: Norgestimate-Ethinyl Estradiol Triphasic Take 1 tablet by mouth daily.       Allergies:  Allergies  Allergen Reactions  . Amoxicillin Hives    Has patient had a PCN reaction causing immediate rash, facial/tongue/throat swelling, SOB or lightheadedness with hypotension: {no Has patient had a PCN reaction causing severe rash involving mucus membranes or skin necrosis: {no Has patient had a PCN reaction that required hospitalization no Has patient had a PCN reaction occurring within the last 10 years: {yes If all of the above answers are "NO", then may proceed with Cephalosporin use.  Marland Kitchen Hydrocodone Hives  . Tdap [Tetanus-Diphth-Acell Pertussis] Other (See Comments)    Fever, Joint stiffness    Past Medical History, Surgical history, Social history, and Family History were reviewed and updated.  Review of Systems: All other 10 point review of systems is negative.   Physical Exam:  weight is 184 lb 12.8 oz (83.8 kg). Her oral temperature is 97.3 F (36.3 C) (abnormal). Her blood pressure is 110/58 (abnormal) and her pulse is 72. Her oxygen saturation is 100%.   Wt Readings from Last 3 Encounters:  09/15/19 184 lb 12.8 oz (83.8 kg)  08/23/19 183 lb (83 kg)  05/13/19 186 lb (84.4 kg)    Ocular: Sclerae unicteric, pupils equal, round and  reactive to light Ear-nose-throat: Oropharynx clear, dentition fair Lymphatic: No cervical or supraclavicular adenopathy Lungs no rales or rhonchi, good excursion bilaterally Heart regular rate and rhythm, no murmur appreciated Abd soft, nontender, positive bowel sounds, no liver or spleen tip palpated on exam, no fluid wave  MSK no focal spinal tenderness, no joint edema Neuro: non-focal, well-oriented, appropriate affect Breasts: Deferred   Lab Results  Component Value Date   WBC 8.2 05/13/2019   HGB 14.5 05/13/2019   HCT 42.7 05/13/2019   MCV 96.0 05/13/2019   PLT 372 05/13/2019   Lab Results  Component Value Date   FERRITIN 328 (H) 05/13/2019   IRON 67 05/13/2019   TIBC 408 05/13/2019   UIBC 341 05/13/2019   IRONPCTSAT 16 (L) 05/13/2019   Lab Results  Component Value Date   RETICCTPCT 2.1 05/13/2019   RBC 4.46 05/13/2019   RBC 4.45 05/13/2019   No results found for: KPAFRELGTCHN, LAMBDASER, KAPLAMBRATIO No results found for: IGGSERUM, IGA, IGMSERUM No results found for: Karla Huber, SPEI   Chemistry      Component Value Date/Time   NA 140 05/13/2019 1254   NA 140 09/17/2018 0920   K 3.8 05/13/2019 1254   CL 104 05/13/2019 1254   CO2 25 05/13/2019 1254   BUN 7 05/13/2019 1254   BUN 10 09/17/2018 0920   CREATININE 0.76 05/13/2019 1254      Component Value Date/Time   CALCIUM 9.1 05/13/2019 1254   ALKPHOS 71 05/13/2019 1254   AST 15 05/13/2019 1254   ALT 24 05/13/2019 1254   BILITOT 0.4 05/13/2019 1254       Impression and Plan: KarlaGilmoreis a very pleasant 33 y/o caucasian female with a history of iron deficiency anemia due to heavy cycles. We will see what her iron studies show and bring her back in for infusion if needed.  We will plan to see her back in another 4 months.  She will contact our office with any questions or concerns. We can certainly see her sooner if needed.   Karla Gins,  NP 11/18/20201:35 PM

## 2019-09-16 LAB — IRON AND TIBC
Iron: 78 ug/dL (ref 41–142)
Saturation Ratios: 21 % (ref 21–57)
TIBC: 371 ug/dL (ref 236–444)
UIBC: 293 ug/dL (ref 120–384)

## 2019-09-16 LAB — FERRITIN: Ferritin: 498 ng/mL — ABNORMAL HIGH (ref 11–307)

## 2019-10-01 ENCOUNTER — Encounter: Payer: Self-pay | Admitting: Family Medicine

## 2019-10-01 ENCOUNTER — Other Ambulatory Visit: Payer: Self-pay

## 2019-10-01 ENCOUNTER — Ambulatory Visit (INDEPENDENT_AMBULATORY_CARE_PROVIDER_SITE_OTHER): Payer: BC Managed Care – PPO | Admitting: Family Medicine

## 2019-10-01 VITALS — BP 116/77 | HR 95 | Temp 98.9°F

## 2019-10-01 DIAGNOSIS — M9900 Segmental and somatic dysfunction of head region: Secondary | ICD-10-CM | POA: Diagnosis not present

## 2019-10-01 DIAGNOSIS — M9901 Segmental and somatic dysfunction of cervical region: Secondary | ICD-10-CM

## 2019-10-01 DIAGNOSIS — M9902 Segmental and somatic dysfunction of thoracic region: Secondary | ICD-10-CM

## 2019-10-01 DIAGNOSIS — M9905 Segmental and somatic dysfunction of pelvic region: Secondary | ICD-10-CM

## 2019-10-01 DIAGNOSIS — M9908 Segmental and somatic dysfunction of rib cage: Secondary | ICD-10-CM

## 2019-10-01 DIAGNOSIS — M9904 Segmental and somatic dysfunction of sacral region: Secondary | ICD-10-CM | POA: Diagnosis not present

## 2019-10-01 DIAGNOSIS — M542 Cervicalgia: Secondary | ICD-10-CM

## 2019-10-01 MED ORDER — MONTELUKAST SODIUM 10 MG PO TABS: ORAL_TABLET | ORAL | 3 refills | Status: DC

## 2019-10-01 MED ORDER — CYCLOBENZAPRINE HCL 10 MG PO TABS: 10.0000 mg | ORAL_TABLET | Freq: Every day | ORAL | 3 refills | Status: DC

## 2019-10-01 NOTE — Progress Notes (Signed)
BP 116/77   Pulse 95   Temp 98.9 F (37.2 C)   SpO2 99%    Subjective:    Patient ID: Karla Huber, female    DOB: December 04, 1985, 33 y.o.   MRN: [id number]  HPI: Karla Huber is a 33 y.o. female  Chief Complaint  Patient presents with  . Neck Pain   Neck got really bad last Wednesday. Got a massage and that helped, but only about 50% and she is still feeling really bad. Pain is aching and sore and sharp at times. It's severe and radiating into the back of her head. Better with OMT and massage. Unclear what makes it worse. She is otherwise feeling well with no other concerns or complaints at this time.   Relevant past medical, surgical, family and social history reviewed and updated as indicated. Interim medical history since our last visit reviewed. Allergies and medications reviewed and updated.  Review of Systems  Constitutional: Negative.   Respiratory: Negative.   Cardiovascular: Negative.   Musculoskeletal: Positive for myalgias, neck pain and neck stiffness. Negative for arthralgias, back pain, gait problem and joint swelling.  Neurological: Positive for headaches. Negative for dizziness, tremors, seizures, syncope, facial asymmetry, speech difficulty, weakness, light-headedness and numbness.  Psychiatric/Behavioral: Negative.     Per HPI unless specifically indicated above     Objective:    BP 116/77   Pulse 95   Temp 98.9 F (37.2 C)   SpO2 99%   Wt Readings from Last 3 Encounters:  09/15/19 184 lb 12.8 oz (83.8 kg)  08/23/19 183 lb (83 kg)  05/13/19 186 lb (84.4 kg)    Physical Exam Vitals signs and nursing note reviewed.  Constitutional:      General: She is not in acute distress.    Appearance: Normal appearance. She is not ill-appearing.  HENT:     Head: Normocephalic and atraumatic.     Right Ear: External ear normal.     Left Ear: External ear normal.     Nose: Nose normal.     Mouth/Throat:     Mouth: Mucous membranes are moist.   Pharynx: Oropharynx is clear.  Eyes:     Extraocular Movements: Extraocular movements intact.     Conjunctiva/sclera: Conjunctivae normal.     Pupils: Pupils are equal, round, and reactive to light.  Neck:     Musculoskeletal: No muscular tenderness.     Vascular: No carotid bruit.  Cardiovascular:     Rate and Rhythm: Normal rate.     Pulses: Normal pulses.  Pulmonary:     Effort: Pulmonary effort is normal. No respiratory distress.  Abdominal:     General: Abdomen is flat. There is no distension.     Palpations: Abdomen is soft. There is no mass.     Tenderness: There is no abdominal tenderness. There is no right CVA tenderness, left CVA tenderness, guarding or rebound.     Hernia: No hernia is present.  Lymphadenopathy:     Cervical: No cervical adenopathy.  Skin:    General: Skin is warm and dry.     Capillary Refill: Capillary refill takes less than 2 seconds.     Coloration: Skin is not jaundiced or pale.     Findings: No bruising, erythema, lesion or rash.  Neurological:     General: No focal deficit present.     Mental Status: She is alert. Mental status is at baseline.  Psychiatric:        Mood and Affect:  Mood normal.        Behavior: Behavior normal.        Thought Content: Thought content normal.        Judgment: Judgment normal.    Musculoskeletal:  Exam found Decreased ROM, Tissue texture changes, Tenderness to palpation and Asymmetry of patient's  head, neck, thorax, ribs, pelvis and sacrum Osteopathic Structural Exam:   Head: L torsion, decreased exhalation on the R, hypertonic suboccipital muscles, OAESSL, temporal internally rotated and anterior, nasion rotated L, zygoma compressed on the R, frontal compressed  Neck: SCM hypertonic on the R, C3ESL, C2 rotated R  Thorax: T3-5SLRR  Ribs: Ribs 5-8 locked up on the R, Rib 6 locked up on the L  Pelvis: R innominate anterior  Sacrum: R on R torsion  Results for orders placed or performed in visit on 09/15/19   CBC with Differential (Cancer Center Only)  Result Value Ref Range   WBC Count 9.9 4.0 - 10.5 K/uL   RBC 4.24 3.87 - 5.11 MIL/uL   Hemoglobin 13.8 12.0 - 15.0 g/dL   HCT 21.339.9 08.636.0 - 57.846.0 %   MCV 94.1 80.0 - 100.0 fL   MCH 32.5 26.0 - 34.0 pg   MCHC 34.6 30.0 - 36.0 g/dL   RDW 46.911.6 62.911.5 - 52.815.5 %   Platelet Count 353 150 - 400 K/uL   nRBC 0.0 0.0 - 0.2 %   Neutrophils Relative % 65 %   Neutro Abs 6.6 1.7 - 7.7 K/uL   Lymphocytes Relative 25 %   Lymphs Abs 2.5 0.7 - 4.0 K/uL   Monocytes Relative 7 %   Monocytes Absolute 0.7 0.1 - 1.0 K/uL   Eosinophils Relative 2 %   Eosinophils Absolute 0.2 0.0 - 0.5 K/uL   Basophils Relative 1 %   Basophils Absolute 0.1 0.0 - 0.1 K/uL   Immature Granulocytes 0 %   Abs Immature Granulocytes 0.01 0.00 - 0.07 K/uL  Reticulocytes  Result Value Ref Range   Retic Ct Pct 3.0 0.4 - 3.1 %   RBC. 4.13 3.87 - 5.11 MIL/uL   Retic Count, Absolute 124.3 19.0 - 186.0 K/uL   Immature Retic Fract 7.6 2.3 - 15.9 %  Iron and TIBC  Result Value Ref Range   Iron 78 41 - 142 ug/dL   TIBC 413371 244236 - 010444 ug/dL   Saturation Ratios 21 21 - 57 %   UIBC 293 120 - 384 ug/dL  Ferritin  Result Value Ref Range   Ferritin 498 (H) 11 - 307 ng/mL      Assessment & Plan:   Problem List Items Addressed This Visit    None    Visit Diagnoses    Neck pain    -  Primary   In exacerbation. Will treat with flexeril. Stretches given. Treated today with OMT for SD contributing to symptoms with good results as below.   Somatic dysfunction of sacral region       Somatic dysfunction of pelvis region       Somatic dysfunction of rib region       Thoracic segment dysfunction       Head region somatic dysfunction       Cervical (neck) region somatic dysfunction        After verbal consent was obtained, patient was treated today with osteopathic manipulative medicine to the regions of the head, neck, thorax, ribs, pelvis and sacrum using the techniques of cranial, myofascial  release, counterstrain, muscle energy, HVLA and soft  tissue. Areas of compensation relating to her primary pain source also treated. Patient tolerated the procedure well with good objective and good subjective improvement in symptoms. She left the room in good condition. She was advised to stay well hydrated and that she may have some soreness following the procedure. If not improving or worsening, she will call and come in. Home exercise program of stretches for suboccipital muscles discussed and demonstrated today. Patient will do these stretches BID to before the point of pain, and will return for reevaluation   in 2-3 weeks.    Follow up plan: Return in about 2 weeks (around 10/15/2019).

## 2019-10-01 NOTE — Patient Instructions (Signed)

## 2019-10-04 DIAGNOSIS — Z03818 Encounter for observation for suspected exposure to other biological agents ruled out: Secondary | ICD-10-CM | POA: Diagnosis not present

## 2019-10-25 ENCOUNTER — Encounter: Payer: BC Managed Care – PPO | Admitting: Family Medicine

## 2019-11-01 ENCOUNTER — Ambulatory Visit (INDEPENDENT_AMBULATORY_CARE_PROVIDER_SITE_OTHER): Payer: BC Managed Care – PPO | Admitting: Family Medicine

## 2019-11-01 ENCOUNTER — Encounter: Payer: Self-pay | Admitting: Family Medicine

## 2019-11-01 ENCOUNTER — Other Ambulatory Visit: Payer: Self-pay

## 2019-11-01 VITALS — BP 104/70 | HR 112

## 2019-11-01 DIAGNOSIS — M9902 Segmental and somatic dysfunction of thoracic region: Secondary | ICD-10-CM | POA: Diagnosis not present

## 2019-11-01 DIAGNOSIS — B370 Candidal stomatitis: Secondary | ICD-10-CM | POA: Diagnosis not present

## 2019-11-01 DIAGNOSIS — M9901 Segmental and somatic dysfunction of cervical region: Secondary | ICD-10-CM | POA: Diagnosis not present

## 2019-11-01 DIAGNOSIS — M9900 Segmental and somatic dysfunction of head region: Secondary | ICD-10-CM

## 2019-11-01 DIAGNOSIS — M542 Cervicalgia: Secondary | ICD-10-CM | POA: Diagnosis not present

## 2019-11-01 MED ORDER — FLUCONAZOLE 100 MG PO TABS: 100.0000 mg | ORAL_TABLET | Freq: Every day | ORAL | 0 refills | Status: DC

## 2019-11-01 NOTE — Progress Notes (Signed)
BP 104/70   Pulse (!) 112   SpO2 99%    Subjective:    Patient ID: Karla Huber, female    DOB: [birth year], 34 y.o.   MRN: [id number]  HPI: Karla Huber is a 34 y.o. female  Chief Complaint  Patient presents with  . Neck Pain   [name] presents today for evaluation and possible treatment with OMT for neck pain. She states that she did much better after her last appointment, but that her neck is still bothering her. She notess that the muscle relaxer really helped, but it wears off after about 2-3 days. Getting better most days. Ordered a new pillow but waiting for it to come. Her pain is tight and aching and sore. Better with stretching, the muscle relaxer and OMT. Worse with sleep. Pain radiates into her jaw. She does not want to have to take the muscle relaxer every day, but it does seem to help. Does not think she's been grinding. No other concerns or complaints at this time.   Thrush at the back of her mouth for a couple of days. Happens about 2x a year since she was 29. Thinks it's from not rinsing well enough with her inhaler. Prickly and irritating. No other concerns or complaints at this time.   Relevant past medical, surgical, family and social history reviewed and updated as indicated. Interim medical history since our last visit reviewed. Allergies and medications reviewed and updated.  Review of Systems  Constitutional: Negative.   HENT: Positive for sore throat. Negative for congestion, dental problem, drooling, ear discharge, ear pain, facial swelling, hearing loss, mouth sores, nosebleeds, postnasal drip, rhinorrhea, sinus pressure, sinus pain, sneezing, tinnitus, trouble swallowing and voice change.   Respiratory: Negative.   Cardiovascular: Negative.   Musculoskeletal: Positive for myalgias, neck pain and neck stiffness. Negative for arthralgias, back pain, gait problem and joint swelling.  Skin: Negative.   Neurological: Negative.   Psychiatric/Behavioral:  Negative.     Per HPI unless specifically indicated above     Objective:    BP 104/70   Pulse (!) 112   SpO2 99%   Wt Readings from Last 3 Encounters:  09/15/19 184 lb 12.8 oz (83.8 kg)  08/23/19 183 lb (83 kg)  05/13/19 186 lb (84.4 kg)    Physical Exam Vitals and nursing note reviewed.  Constitutional:      General: She is not in acute distress.    Appearance: Normal appearance. She is not ill-appearing.  HENT:     Head: Normocephalic and atraumatic.     Right Ear: External ear normal.     Left Ear: External ear normal.     Nose: Nose normal.     Mouth/Throat:     Mouth: Mucous membranes are moist.     Pharynx: Oropharynx is clear.     Comments: White patches on back of her tongue Eyes:     Extraocular Movements: Extraocular movements intact.     Conjunctiva/sclera: Conjunctivae normal.     Pupils: Pupils are equal, round, and reactive to light.  Neck:     Vascular: No carotid bruit.  Cardiovascular:     Rate and Rhythm: Normal rate.     Pulses: Normal pulses.  Pulmonary:     Effort: Pulmonary effort is normal. No respiratory distress.  Abdominal:     General: Abdomen is flat. There is no distension.     Palpations: Abdomen is soft. There is no mass.     Tenderness: There is  no abdominal tenderness. There is no right CVA tenderness, left CVA tenderness, guarding or rebound.     Hernia: No hernia is present.  Musculoskeletal:     Cervical back: No muscular tenderness.  Lymphadenopathy:     Cervical: No cervical adenopathy.  Skin:    General: Skin is warm and dry.     Capillary Refill: Capillary refill takes less than 2 seconds.     Coloration: Skin is not jaundiced or pale.     Findings: No bruising, erythema, lesion or rash.  Neurological:     General: No focal deficit present.     Mental Status: She is alert. Mental status is at baseline.  Psychiatric:        Mood and Affect: Mood normal.        Behavior: Behavior normal.        Thought Content: Thought  content normal.        Judgment: Judgment normal.   Musculoskeletal:  Exam found Decreased ROM, Tissue texture changes, Tenderness to palpation and Asymmetry of patient's  head, neck and thorax Osteopathic Structural Exam:   Head: OAESSR, lateral pterygoid hypertonic on the R, palate restricted on the R, zygoma restricted on the R, hypertonic suboccipital muscles   Neck: C3ESRR, C4ESRR, L rhomboid hypertonic, R paraspinal hypertonic  Thorax: T3-5SRRL, T6ESRR   Results for orders placed or performed in visit on 09/15/19  CBC with Differential (Cancer Center Only)  Result Value Ref Range   WBC Count 9.9 4.0 - 10.5 K/uL   RBC 4.24 3.87 - 5.11 MIL/uL   Hemoglobin 13.8 12.0 - 15.0 g/dL   HCT 84.1 32.4 - 40.1 %   MCV 94.1 80.0 - 100.0 fL   MCH 32.5 26.0 - 34.0 pg   MCHC 34.6 30.0 - 36.0 g/dL   RDW 02.7 25.3 - 66.4 %   Platelet Count 353 150 - 400 K/uL   nRBC 0.0 0.0 - 0.2 %   Neutrophils Relative % 65 %   Neutro Abs 6.6 1.7 - 7.7 K/uL   Lymphocytes Relative 25 %   Lymphs Abs 2.5 0.7 - 4.0 K/uL   Monocytes Relative 7 %   Monocytes Absolute 0.7 0.1 - 1.0 K/uL   Eosinophils Relative 2 %   Eosinophils Absolute 0.2 0.0 - 0.5 K/uL   Basophils Relative 1 %   Basophils Absolute 0.1 0.0 - 0.1 K/uL   Immature Granulocytes 0 %   Abs Immature Granulocytes 0.01 0.00 - 0.07 K/uL  Reticulocytes  Result Value Ref Range   Retic Ct Pct 3.0 0.4 - 3.1 %   RBC. 4.13 3.87 - 5.11 MIL/uL   Retic Count, Absolute 124.3 19.0 - 186.0 K/uL   Immature Retic Fract 7.6 2.3 - 15.9 %  Iron and TIBC  Result Value Ref Range   Iron 78 41 - 142 ug/dL   TIBC 403 474 - 259 ug/dL   Saturation Ratios 21 21 - 57 %   UIBC 293 120 - 384 ug/dL  Ferritin  Result Value Ref Range   Ferritin 498 (H) 11 - 307 ng/mL      Assessment & Plan:   Problem List Items Addressed This Visit    None    Visit Diagnoses    Neck pain    -  Primary   She does have somatic dysfunction contributing to her symptoms. Treated today  with good results as below. Continue flexeril- if not getting better next visit PT   Thrush  Will treat with diflucan. Call if not getting better.    Relevant Medications   fluconazole (DIFLUCAN) 100 MG tablet   Head region somatic dysfunction       Thoracic segment dysfunction       Cervical (neck) region somatic dysfunction        After verbal consent was obtained, patient was treated today with osteopathic manipulative medicine to the regions of the head, neck and thorax using the techniques of cranial, myofascial release, counterstrain, muscle energy, HVLA and soft tissue. Areas of compensation relating to her primary pain source also treated. Patient tolerated the procedure well with good objective and good subjective improvement in symptoms. She left the room in good condition. She was advised to stay well hydrated and that she may have some soreness following the procedure. If not improving or worsening, she will call and come in. Home exercise program of stretches for neck discussed and demonstrated today. Patient will do these stretches BID to before the point of pain, and will return for reevaluation   in 2-3 weeks.    Follow up plan: Return in about 2 weeks (around 11/15/2019).

## 2019-11-12 ENCOUNTER — Ambulatory Visit (INDEPENDENT_AMBULATORY_CARE_PROVIDER_SITE_OTHER): Payer: BC Managed Care – PPO | Admitting: Family Medicine

## 2019-11-12 ENCOUNTER — Encounter: Payer: Self-pay | Admitting: Family Medicine

## 2019-11-12 ENCOUNTER — Other Ambulatory Visit: Payer: Self-pay

## 2019-11-12 VITALS — BP 99/64 | HR 85 | Temp 98.9°F | Ht 62.5 in | Wt 186.6 lb

## 2019-11-12 DIAGNOSIS — K909 Intestinal malabsorption, unspecified: Secondary | ICD-10-CM | POA: Diagnosis not present

## 2019-11-12 DIAGNOSIS — Z0000 Encounter for general adult medical examination without abnormal findings: Secondary | ICD-10-CM | POA: Diagnosis not present

## 2019-11-12 DIAGNOSIS — L719 Rosacea, unspecified: Secondary | ICD-10-CM | POA: Diagnosis not present

## 2019-11-12 LAB — UA/M W/RFLX CULTURE, ROUTINE
Bilirubin, UA: NEGATIVE
Glucose, UA: NEGATIVE
Ketones, UA: NEGATIVE
Leukocytes,UA: NEGATIVE
Nitrite, UA: NEGATIVE
Protein,UA: NEGATIVE
RBC, UA: NEGATIVE
Specific Gravity, UA: <1.005 — ABNORMAL LOW (ref 1.005–1.030)
Urobilinogen, Ur: 0.2 mg/dL (ref 0.2–1.0)
pH, UA: 5.5 (ref 5.0–7.5)

## 2019-11-12 MED ORDER — TRETINOIN 0.1 % EX CREA: TOPICAL_CREAM | Freq: Every day | CUTANEOUS | 6 refills | Status: DC

## 2019-11-12 NOTE — Patient Instructions (Signed)
Health Maintenance, Female Adopting a healthy lifestyle and getting preventive care are important in promoting health and wellness. Ask your health care provider about:  The right schedule for you to have regular tests and exams.  Things you can do on your own to prevent diseases and keep yourself healthy. What should I know about diet, weight, and exercise? Eat a healthy diet   Eat a diet that includes plenty of vegetables, fruits, low-fat dairy products, and lean protein.  Do not eat a lot of foods that are high in solid fats, added sugars, or sodium. Maintain a healthy weight Body mass index (BMI) is used to identify weight problems. It estimates body fat based on height and weight. Your health care provider can help determine your BMI and help you achieve or maintain a healthy weight. Get regular exercise Get regular exercise. This is one of the most important things you can do for your health. Most adults should:  Exercise for at least 150 minutes each week. The exercise should increase your heart rate and make you sweat (moderate-intensity exercise).  Do strengthening exercises at least twice a week. This is in addition to the moderate-intensity exercise.  Spend less time sitting. Even light physical activity can be beneficial. Watch cholesterol and blood lipids Have your blood tested for lipids and cholesterol at 35 years of age, then have this test every 5 years. Have your cholesterol levels checked more often if:  Your lipid or cholesterol levels are high.  You are older than 35 years of age.  You are at high risk for heart disease. What should I know about cancer screening? Depending on your health history and family history, you may need to have cancer screening at various ages. This may include screening for:  Breast cancer.  Cervical cancer.  Colorectal cancer.  Skin cancer.  Lung cancer. What should I know about heart disease, diabetes, and high blood  pressure? Blood pressure and heart disease  High blood pressure causes heart disease and increases the risk of stroke. This is more likely to develop in people who have high blood pressure readings, are of African descent, or are overweight.  Have your blood pressure checked: ? Every 3-5 years if you are 18-39 years of age. ? Every year if you are 40 years old or older. Diabetes Have regular diabetes screenings. This checks your fasting blood sugar level. Have the screening done:  Once every three years after age 40 if you are at a normal weight and have a low risk for diabetes.  More often and at a younger age if you are overweight or have a high risk for diabetes. What should I know about preventing infection? Hepatitis B If you have a higher risk for hepatitis B, you should be screened for this virus. Talk with your health care provider to find out if you are at risk for hepatitis B infection. Hepatitis C Testing is recommended for:  Everyone born from 1945 through 1965.  Anyone with known risk factors for hepatitis C. Sexually transmitted infections (STIs)  Get screened for STIs, including gonorrhea and chlamydia, if: ? You are sexually active and are younger than 35 years of age. ? You are older than 35 years of age and your health care provider tells you that you are at risk for this type of infection. ? Your sexual activity has changed since you were last screened, and you are at increased risk for chlamydia or gonorrhea. Ask your health care provider if   you are at risk.  Ask your health care provider about whether you are at high risk for HIV. Your health care provider may recommend a prescription medicine to help prevent HIV infection. If you choose to take medicine to prevent HIV, you should first get tested for HIV. You should then be tested every 3 months for as long as you are taking the medicine. Pregnancy  If you are about to stop having your period (premenopausal) and  you may become pregnant, seek counseling before you get pregnant.  Take 400 to 800 micrograms (mcg) of folic acid every day if you become pregnant.  Ask for birth control (contraception) if you want to prevent pregnancy. Osteoporosis and menopause Osteoporosis is a disease in which the bones lose minerals and strength with aging. This can result in bone fractures. If you are 65 years old or older, or if you are at risk for osteoporosis and fractures, ask your health care provider if you should:  Be screened for bone loss.  Take a calcium or vitamin D supplement to lower your risk of fractures.  Be given hormone replacement therapy (HRT) to treat symptoms of menopause. Follow these instructions at home: Lifestyle  Do not use any products that contain nicotine or tobacco, such as cigarettes, e-cigarettes, and chewing tobacco. If you need help quitting, ask your health care provider.  Do not use street drugs.  Do not share needles.  Ask your health care provider for help if you need support or information about quitting drugs. Alcohol use  Do not drink alcohol if: ? Your health care provider tells you not to drink. ? You are pregnant, may be pregnant, or are planning to become pregnant.  If you drink alcohol: ? Limit how much you use to 0-1 drink a day. ? Limit intake if you are breastfeeding.  Be aware of how much alcohol is in your drink. In the [place] one drink equals one 12 oz bottle of beer (355 mL), one 5 oz glass of wine (148 mL), or one 1 oz glass of hard liquor (44 mL). General instructions  Schedule regular health, dental, and eye exams.  Stay current with your vaccines.  Tell your health care provider if: ? You often feel depressed. ? You have ever been abused or do not feel safe at home. Summary  Adopting a healthy lifestyle and getting preventive care are important in promoting health and wellness.  Follow your health care provider's instructions about healthy  diet, exercising, and getting tested or screened for diseases.  Follow your health care provider's instructions on monitoring your cholesterol and blood pressure. This information is not intended to replace advice given to you by your health care provider. Make sure you discuss any questions you have with your health care provider. Document Revised: 10/07/2018 Document Reviewed: 10/07/2018 [employer] Patient Education  2020 [employer]  

## 2019-11-12 NOTE — Progress Notes (Signed)
BP 99/64   Pulse 85   Temp 98.9 F (37.2 C) (Oral)   Ht 5' 2.5" (1.588 m)   Wt 186 lb 9.6 oz (84.6 kg)   SpO2 99%   BMI 33.59 kg/m    Subjective:    Patient ID: Karla Huber, female    DOB: Mar 21, 1986, 34 y.o.   MRN: [id number]  HPI: Karla Huber is a 34 y.o. female presenting on 11/12/2019 for comprehensive medical examination. Current medical complaints include:  RASH Duration:  chronic  Location: face  Itching: no Burning: yes Redness: yes Oozing: no Scaling: no Blisters: no Painful: no Fevers: no Change in detergents/soaps/personal care products: no Recent illness: no Recent travel:no History of same: yes Context: stable Alleviating factors: metrogel, clinique anti-redness Treatments attempted:  metrogel, clinique anti-redness Shortness of breath: no  Throat/tongue swelling: no Myalgias/arthralgias: no  She notes that she is getting IV iron from a hematologist, but would like to get a second opinion as her ferritin has been running very high. No other concerns or complaints at this time.   She currently lives with: husband and kids Menopausal Symptoms: yes  Depression Screen done today and results listed below:  Depression screen [facility] 2/9 11/12/2019 09/17/2018 08/29/2017 08/27/2016  Decreased Interest 0 0 0 0  Down, Depressed, Hopeless 0 0 0 0  PHQ - 2 Score 0 0 0 0  Altered sleeping - 0 - -  Tired, decreased energy - 2 - -  Change in appetite - 0 - -  Feeling bad or failure about yourself  - 0 - -  Trouble concentrating - 1 - -  Moving slowly or fidgety/restless - 0 - -  Suicidal thoughts - 0 - -  PHQ-9 Score - 3 - -  Difficult doing work/chores - Not difficult at all - -    Past Medical History:  Past Medical History:  Diagnosis Date  . Acute blood loss anemia 11/13/2016  . Asthma    pulmocort daily  . GERD (gastroesophageal reflux disease)   . Gestational diabetes    diet controlled  . Headache   . Hx of varicella   . Hypertension    pre-eclampsia  . Iron deficiency anemia of pregnancy 11/13/2016  . 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 Menometrorrhagia 12/08/2017  . Postpartum care following cesarean delivery (4/30) 02/25/2015  . Postpartum care following repeat cesarean delivery (1/16) 11/13/2016  . Preeclampsia   . Traumatic injury during pregnancy in third trimester 02/09/2015    Surgical History:  Past Surgical History:  Procedure Laterality Date  . ADENOIDECTOMY    . CESAREAN SECTION N/A 02/25/2015   Procedure: CESAREAN SECTION;  Surgeon: Azucena Fallen, MD;  Location: [facility];  Service: Obstetrics;  Laterality: N/A;  . CESAREAN SECTION N/A 11/12/2016   Procedure: Repeat CESAREAN SECTION;  Surgeon: Brien Few, MD;  Location: [place];  Service: Obstetrics;  Laterality: N/A;  . FOOT SURGERY    . KNEE SURGERY    . TONSILLECTOMY      Medications:  Current Outpatient Medications on File Prior to Visit  Medication Sig  . budesonide (RHINOCORT ALLERGY) 32 MCG/ACT nasal spray Place 2 sprays into both nostrils daily.  . cetirizine (ZYRTEC) 10 MG tablet Take 10 mg by mouth daily.  . cyclobenzaprine (FLEXERIL) 10 MG tablet Take 1 tablet (10 mg total) by mouth at bedtime.  . montelukast (SINGULAIR) 10 MG tablet TAKE 1 TABLET BY MOUTH EVERYDAY AT BEDTIME  . omeprazole (PRILOSEC OTC) 20 MG tablet Take 20  mg by mouth daily.  . TRI-LO-MARZIA 0.18/0.215/0.25 MG-25 MCG tab Take 1 tablet by mouth daily.   No current facility-administered medications on file prior to visit.    Allergies:  Allergies  Allergen Reactions  . Amoxicillin Hives    Has patient had a PCN reaction causing immediate rash, facial/tongue/throat swelling, SOB or lightheadedness with hypotension: {no Has patient had a PCN reaction causing severe rash involving mucus membranes or skin necrosis: {no Has patient had a PCN reaction that required hospitalization no Has patient had a PCN reaction occurring within the last 10 years: {yes If all of  the above answers are "NO", then may proceed with Cephalosporin use.  Marland Kitchen Hydrocodone Hives  . Tdap [Tetanus-Diphth-Acell Pertussis] Other (See Comments)    Fever, Joint stiffness    Social History:  Social History   Socioeconomic History  . Marital status: Married    Spouse name: Not on file  . Number of children: Not on file  . Years of education: Not on file  . Highest education level: Not on file  Occupational History  . Occupation: umemployed  Tobacco Use  . Smoking status: Never Smoker  . Smokeless tobacco: Never Used  Substance and Sexual Activity  . Alcohol use: No  . Drug use: No  . Sexual activity: Yes    Birth control/protection: None  Other Topics Concern  . Not on file  Social History Narrative   Exercises try 3-5 times a week for 30-44mn   Social Determinants of Health   Financial Resource Strain:   . Difficulty of Paying Living Expenses: Not on file  Food Insecurity:   . Worried About RCharity fundraiserin the Last Year: Not on file  . Ran Out of Food in the Last Year: Not on file  Transportation Needs:   . Lack of Transportation (Medical): Not on file  . Lack of Transportation (Non-Medical): Not on file  Physical Activity:   . Days of Exercise per Week: Not on file  . Minutes of Exercise per Session: Not on file  Stress:   . Feeling of Stress : Not on file  Social Connections:   . Frequency of Communication with Friends and Family: Not on file  . Frequency of Social Gatherings with Friends and Family: Not on file  . Attends Religious Services: Not on file  . Active Member of Clubs or Organizations: Not on file  . Attends CArchivistMeetings: Not on file  . Marital Status: Not on file  Intimate Partner Violence:   . Fear of Current or Ex-Partner: Not on file  . Emotionally Abused: Not on file  . Physically Abused: Not on file  . Sexually Abused: Not on file   Social History   Tobacco Use  Smoking Status Never Smoker  Smokeless  Tobacco Never Used   Social History   Substance and Sexual Activity  Alcohol Use No    Family History:  Family History  Problem Relation Age of Onset  . Heart disease Mother   . Mitral valve prolapse Father   . Asthma Sister   . Hypertension Maternal Grandmother   . Cancer Cousin        AML    Past medical history, surgical history, medications, allergies, family history and social history reviewed with patient today and changes made to appropriate areas of the chart.   Review of Systems  Constitutional: Negative for chills, diaphoresis, fever, malaise/fatigue and weight loss.  HENT: Positive for hearing loss. Negative  for congestion, ear discharge, ear pain, nosebleeds, sinus pain, sore throat and tinnitus.   Eyes: Negative.   Respiratory: Negative.  Negative for stridor.   Cardiovascular: Negative.   Gastrointestinal: Negative.   Genitourinary: Negative.   Musculoskeletal: Positive for back pain, myalgias and neck pain. Negative for falls and joint pain.  Skin: Positive for rash. Negative for itching.  Neurological: Positive for tingling (in the 2nd and 3rd toes when working out on L foot). Negative for dizziness, tremors, sensory change, speech change, focal weakness, seizures, loss of consciousness, weakness and headaches.  Endo/Heme/Allergies: Negative.   Psychiatric/Behavioral: Negative.     All other ROS negative except what is listed above and in the HPI.      Objective:    BP 99/64   Pulse 85   Temp 98.9 F (37.2 C) (Oral)   Ht 5' 2.5" (1.588 m)   Wt 186 lb 9.6 oz (84.6 kg)   SpO2 99%   BMI 33.59 kg/m   Wt Readings from Last 3 Encounters:  11/12/19 186 lb 9.6 oz (84.6 kg)  09/15/19 184 lb 12.8 oz (83.8 kg)  08/23/19 183 lb (83 kg)    Physical Exam Vitals and nursing note reviewed.  Constitutional:      General: She is not in acute distress.    Appearance: Normal appearance. She is not ill-appearing, toxic-appearing or diaphoretic.  HENT:     Head:  Normocephalic and atraumatic.     Right Ear: Tympanic membrane, ear canal and external ear normal. There is no impacted cerumen.     Left Ear: Tympanic membrane, ear canal and external ear normal. There is no impacted cerumen.     Nose: Nose normal. No congestion or rhinorrhea.     Mouth/Throat:     Mouth: Mucous membranes are moist.     Pharynx: Oropharynx is clear. No oropharyngeal exudate or posterior oropharyngeal erythema.  Eyes:     General: No scleral icterus.       Right eye: No discharge.        Left eye: No discharge.     Extraocular Movements: Extraocular movements intact.     Conjunctiva/sclera: Conjunctivae normal.     Pupils: Pupils are equal, round, and reactive to light.  Neck:     Vascular: No carotid bruit.  Cardiovascular:     Rate and Rhythm: Normal rate and regular rhythm.     Pulses: Normal pulses.     Heart sounds: No murmur. No friction rub. No gallop.   Pulmonary:     Effort: Pulmonary effort is normal. No respiratory distress.     Breath sounds: Normal breath sounds. No stridor. No wheezing, rhonchi or rales.  Chest:     Chest wall: No tenderness.  Abdominal:     General: Abdomen is flat. Bowel sounds are normal. There is no distension.     Palpations: Abdomen is soft. There is no mass.     Tenderness: There is no abdominal tenderness. There is no right CVA tenderness, left CVA tenderness, guarding or rebound.     Hernia: No hernia is present.  Genitourinary:    Comments: Breast and pelvic exams deferred with shared decision making Musculoskeletal:        General: No swelling, tenderness, deformity or signs of injury.     Cervical back: Normal range of motion and neck supple. No rigidity. No muscular tenderness.     Right lower leg: No edema.     Left lower leg: No edema.  Lymphadenopathy:  Cervical: No cervical adenopathy.  Skin:    General: Skin is warm and dry.     Capillary Refill: Capillary refill takes less than 2 seconds.     Coloration:  Skin is not jaundiced or pale.     Findings: Erythema (on cheeks and chest) present. No bruising, lesion or rash.  Neurological:     General: No focal deficit present.     Mental Status: She is alert and oriented to person, place, and time. Mental status is at baseline.     Cranial Nerves: No cranial nerve deficit.     Sensory: No sensory deficit.     Motor: No weakness.     Coordination: Coordination normal.     Gait: Gait normal.     Deep Tendon Reflexes: Reflexes normal.  Psychiatric:        Mood and Affect: Mood normal.        Behavior: Behavior normal.        Thought Content: Thought content normal.        Judgment: Judgment normal.     Results for orders placed or performed in visit on 11/12/19  CBC with Differential OUT  Result Value Ref Range   WBC 8.6 3.4 - 10.8 x10E3/uL   RBC 4.17 3.77 - 5.28 x10E6/uL   Hemoglobin 13.9 11.1 - 15.9 g/dL   Hematocrit 40.9 34.0 - 46.6 %   MCV 98 (H) 79 - 97 fL   MCH 33.3 (H) 26.6 - 33.0 pg   MCHC 34.0 31.5 - 35.7 g/dL   RDW 11.6 (L) 11.7 - 15.4 %   Platelets 328 150 - 450 x10E3/uL   Neutrophils 69 Not Estab. %   Lymphs 22 Not Estab. %   Monocytes 5 Not Estab. %   Eos 3 Not Estab. %   Basos 1 Not Estab. %   Neutrophils Absolute 5.9 1.4 - 7.0 x10E3/uL   Lymphocytes Absolute 1.9 0.7 - 3.1 x10E3/uL   Monocytes Absolute 0.5 0.1 - 0.9 x10E3/uL   EOS (ABSOLUTE) 0.2 0.0 - 0.4 x10E3/uL   Basophils Absolute 0.0 0.0 - 0.2 x10E3/uL   Immature Granulocytes 0 Not Estab. %   Immature Grans (Abs) 0.0 0.0 - 0.1 x10E3/uL  Comp Met (CMET)  Result Value Ref Range   Glucose 95 65 - 99 mg/dL   BUN 7 6 - 20 mg/dL   Creatinine, Ser 0.69 0.57 - 1.00 mg/dL   GFR calc non Af Amer 114 >59 mL/min/1.73   GFR calc Af Amer 131 >59 mL/min/1.73   BUN/Creatinine Ratio 10 9 - 23   Sodium 139 134 - 144 mmol/L   Potassium 4.3 3.5 - 5.2 mmol/L   Chloride 103 96 - 106 mmol/L   CO2 23 20 - 29 mmol/L   Calcium 9.7 8.7 - 10.2 mg/dL   Total Protein 7.3 6.0 - 8.5  g/dL   Albumin 4.5 3.8 - 4.8 g/dL   Globulin, Total 2.8 1.5 - 4.5 g/dL   Albumin/Globulin Ratio 1.6 1.2 - 2.2   Bilirubin Total 0.3 0.0 - 1.2 mg/dL   Alkaline Phosphatase 67 39 - 117 IU/L   AST 17 0 - 40 IU/L   ALT 29 0 - 32 IU/L  Lipid Panel w/o Chol/HDL Ratio OUT  Result Value Ref Range   Cholesterol, Total 192 100 - 199 mg/dL   Triglycerides 177 (H) 0 - 149 mg/dL   HDL 66 >39 mg/dL   VLDL Cholesterol Cal 30 5 - 40 mg/dL   LDL Chol Calc (  NIH) 96 0 - 99 mg/dL  TSH  Result Value Ref Range   TSH 1.530 0.450 - 4.500 uIU/mL  UA/M w/rflx Culture, Routine   Specimen: Urine   URINE  Result Value Ref Range   Specific Gravity, UA <1.005 (L) 1.005 - 1.030   pH, UA 5.5 5.0 - 7.5   Color, UA Yellow Yellow   Appearance Ur Clear Clear   Leukocytes,UA Negative Negative   Protein,UA Negative Negative/Trace   Glucose, UA Negative Negative   Ketones, UA Negative Negative   RBC, UA Negative Negative   Bilirubin, UA Negative Negative   Urobilinogen, Ur 0.2 0.2 - 1.0 mg/dL   Nitrite, UA Negative Negative      Assessment & Plan:   Problem List Items Addressed This Visit      Digestive   Malabsorption of iron (Chronic)    Would like to get a 2nd opinion due to ferritin running high. Referral made today.       Relevant Orders   Ambulatory referral to Hematology     Musculoskeletal and Integument   Rosacea    Metrogel not doing great. Will continue it and add retin A. Call with any concerns or if not getting better.        Other Visit Diagnoses    Routine general medical examination at a health care facility    -  Primary   Vaccines up to date. Screening labs checked today. Pap up to date. Continue diet and exercise. Call with any concerns.    Relevant Orders   CBC with Differential OUT (Completed)   Comp Met (CMET) (Completed)   Lipid Panel w/o Chol/HDL Ratio OUT (Completed)   TSH (Completed)   UA/M w/rflx Culture, Routine (Completed)       Follow up plan: Return in about  1 year (around 11/11/2020) for Physical.   LABORATORY TESTING:  - Pap smear: not applicable  IMMUNIZATIONS:   - Tdap: Tetanus vaccination status reviewed: N/A. - Influenza: Up to date - Pneumovax: Up to date - HPV: Refused  PATIENT COUNSELING:   Advised to take 1 mg of folate supplement per day if capable of pregnancy.   Sexuality: Discussed sexually transmitted diseases, partner selection, use of condoms, avoidance of unintended pregnancy  and contraceptive alternatives.   Advised to avoid cigarette smoking.  I discussed with the patient that most people either abstain from alcohol or drink within safe limits (<=14/week and <=4 drinks/occasion for males, <=7/weeks and <= 3 drinks/occasion for females) and that the risk for alcohol disorders and other health effects rises proportionally with the number of drinks per week and how often a drinker exceeds daily limits.  Discussed cessation/primary prevention of drug use and availability of treatment for abuse.   Diet: Encouraged to adjust caloric intake to maintain  or achieve ideal body weight, to reduce intake of dietary saturated fat and total fat, to limit sodium intake by avoiding high sodium foods and not adding table salt, and to maintain adequate dietary potassium and calcium preferably from fresh fruits, vegetables, and low-fat dairy products.    stressed the importance of regular exercise  Injury prevention: Discussed safety belts, safety helmets, smoke detector, smoking near bedding or upholstery.   Dental health: Discussed importance of regular tooth brushing, flossing, and dental visits.    NEXT PREVENTATIVE PHYSICAL DUE IN 1 YEAR. Return in about 1 year (around 11/11/2020) for Physical.

## 2019-11-13 LAB — CBC WITH DIFFERENTIAL/PLATELET
Basophils Absolute: 0 10*3/uL (ref 0.0–0.2)
Basos: 1 %
EOS (ABSOLUTE): 0.2 10*3/uL (ref 0.0–0.4)
Eos: 3 %
Hematocrit: 40.9 % (ref 34.0–46.6)
Hemoglobin: 13.9 g/dL (ref 11.1–15.9)
Immature Grans (Abs): 0 10*3/uL (ref 0.0–0.1)
Immature Granulocytes: 0 %
Lymphocytes Absolute: 1.9 10*3/uL (ref 0.7–3.1)
Lymphs: 22 %
MCH: 33.3 pg — ABNORMAL HIGH (ref 26.6–33.0)
MCHC: 34 g/dL (ref 31.5–35.7)
MCV: 98 fL — ABNORMAL HIGH (ref 79–97)
Monocytes Absolute: 0.5 10*3/uL (ref 0.1–0.9)
Monocytes: 5 %
Neutrophils Absolute: 5.9 10*3/uL (ref 1.4–7.0)
Neutrophils: 69 %
Platelets: 328 10*3/uL (ref 150–450)
RBC: 4.17 x10E6/uL (ref 3.77–5.28)
RDW: 11.6 % — ABNORMAL LOW (ref 11.7–15.4)
WBC: 8.6 10*3/uL (ref 3.4–10.8)

## 2019-11-13 LAB — COMPREHENSIVE METABOLIC PANEL
ALT: 29 IU/L (ref 0–32)
AST: 17 IU/L (ref 0–40)
Albumin/Globulin Ratio: 1.6 (ref 1.2–2.2)
Albumin: 4.5 g/dL (ref 3.8–4.8)
Alkaline Phosphatase: 67 IU/L (ref 39–117)
BUN/Creatinine Ratio: 10 (ref 9–23)
BUN: 7 mg/dL (ref 6–20)
Bilirubin Total: 0.3 mg/dL (ref 0.0–1.2)
CO2: 23 mmol/L (ref 20–29)
Calcium: 9.7 mg/dL (ref 8.7–10.2)
Chloride: 103 mmol/L (ref 96–106)
Creatinine, Ser: 0.69 mg/dL (ref 0.57–1.00)
GFR calc Af Amer: 131 mL/min/{1.73_m2} (ref >59)
GFR calc non Af Amer: 114 mL/min/{1.73_m2} (ref >59)
Globulin, Total: 2.8 g/dL (ref 1.5–4.5)
Glucose: 95 mg/dL (ref 65–99)
Potassium: 4.3 mmol/L (ref 3.5–5.2)
Sodium: 139 mmol/L (ref 134–144)
Total Protein: 7.3 g/dL (ref 6.0–8.5)

## 2019-11-13 LAB — LIPID PANEL W/O CHOL/HDL RATIO
Cholesterol, Total: 192 mg/dL (ref 100–199)
HDL: 66 mg/dL (ref >39)
LDL Chol Calc (NIH): 96 mg/dL (ref 0–99)
Triglycerides: 177 mg/dL — ABNORMAL HIGH (ref 0–149)
VLDL Cholesterol Cal: 30 mg/dL (ref 5–40)

## 2019-11-13 LAB — TSH: TSH: 1.53 u[IU]/mL (ref 0.450–4.500)

## 2019-11-14 ENCOUNTER — Encounter: Payer: Self-pay | Admitting: Family Medicine

## 2019-11-14 NOTE — Assessment & Plan Note (Signed)
Metrogel not doing great. Will continue it and add retin A. Call with any concerns or if not getting better.

## 2019-11-14 NOTE — Assessment & Plan Note (Signed)
Would like to get a 2nd opinion due to ferritin running high. Referral made today.

## 2019-11-23 ENCOUNTER — Encounter: Payer: Self-pay | Admitting: Family Medicine

## 2019-11-23 ENCOUNTER — Ambulatory Visit (INDEPENDENT_AMBULATORY_CARE_PROVIDER_SITE_OTHER): Payer: BC Managed Care – PPO | Admitting: Family Medicine

## 2019-11-23 ENCOUNTER — Other Ambulatory Visit: Payer: Self-pay

## 2019-11-23 VITALS — BP 112/74 | HR 92 | Temp 98.6°F | Ht 62.5 in | Wt 187.0 lb

## 2019-11-23 DIAGNOSIS — M9905 Segmental and somatic dysfunction of pelvic region: Secondary | ICD-10-CM

## 2019-11-23 DIAGNOSIS — M9901 Segmental and somatic dysfunction of cervical region: Secondary | ICD-10-CM

## 2019-11-23 DIAGNOSIS — M9900 Segmental and somatic dysfunction of head region: Secondary | ICD-10-CM

## 2019-11-23 DIAGNOSIS — M9909 Segmental and somatic dysfunction of abdomen and other regions: Secondary | ICD-10-CM

## 2019-11-23 DIAGNOSIS — M542 Cervicalgia: Secondary | ICD-10-CM

## 2019-11-23 DIAGNOSIS — M9903 Segmental and somatic dysfunction of lumbar region: Secondary | ICD-10-CM

## 2019-11-23 DIAGNOSIS — M9908 Segmental and somatic dysfunction of rib cage: Secondary | ICD-10-CM

## 2019-11-23 DIAGNOSIS — M9902 Segmental and somatic dysfunction of thoracic region: Secondary | ICD-10-CM

## 2019-11-23 DIAGNOSIS — M9904 Segmental and somatic dysfunction of sacral region: Secondary | ICD-10-CM

## 2019-11-23 NOTE — Progress Notes (Signed)
BP 112/74   Pulse 92   Temp 98.6 F (37 C) (Oral)   Ht 5' 2.5" (1.588 m)   Wt 187 lb (84.8 kg)   SpO2 98%   BMI 33.66 kg/m    Subjective:    Patient ID: Karla Huber, female    DOB: 04-13-1986, 34 y.o.   MRN: [id number]  HPI: Karla Huber is a 34 y.o. female  Chief Complaint  Patient presents with  . Neck Pain    OMM   Karla Huber presents today for evaluation and possible treatment with OMT of her neck and low back pain. She notes that she has been a little better. She felt good for about a week, then she noticed that it started to bother her about 2 days ago in her jaw and her neck and low back. This AM she woke up with pain in the back of her head and going into her jaw. Pain is aching and sore. Muscle relaxers and OMT making better. Worse with carrying and being laid on by her children. Pain radiates into her jaw and her head. She is otherwise feeling well, no other concerns or complaints at this time.   Relevant past medical, surgical, family and social history reviewed and updated as indicated. Interim medical history since our last visit reviewed. Allergies and medications reviewed and updated.  Review of Systems  Constitutional: Negative.   Respiratory: Negative.   Cardiovascular: Negative.   Musculoskeletal: Positive for back pain, myalgias, neck pain and neck stiffness. Negative for arthralgias, gait problem and joint swelling.  Skin: Negative.   Neurological: Positive for headaches. Negative for dizziness, tremors, seizures, syncope, facial asymmetry, speech difficulty, weakness, light-headedness and numbness.  Psychiatric/Behavioral: Negative.     Per HPI unless specifically indicated above     Objective:    BP 112/74   Pulse 92   Temp 98.6 F (37 C) (Oral)   Ht 5' 2.5" (1.588 m)   Wt 187 lb (84.8 kg)   SpO2 98%   BMI 33.66 kg/m   Wt Readings from Last 3 Encounters:  11/23/19 187 lb (84.8 kg)  11/12/19 186 lb 9.6 oz (84.6 kg)  09/15/19 184 lb 12.8  oz (83.8 kg)    Physical Exam Vitals and nursing note reviewed.  Constitutional:      General: She is not in acute distress.    Appearance: Normal appearance. She is not ill-appearing.  HENT:     Head: Normocephalic and atraumatic.     Right Ear: External ear normal.     Left Ear: External ear normal.     Nose: Nose normal.     Mouth/Throat:     Mouth: Mucous membranes are moist.     Pharynx: Oropharynx is clear.  Eyes:     Extraocular Movements: Extraocular movements intact.     Conjunctiva/sclera: Conjunctivae normal.     Pupils: Pupils are equal, round, and reactive to light.  Neck:     Vascular: No carotid bruit.  Cardiovascular:     Rate and Rhythm: Normal rate.     Pulses: Normal pulses.  Pulmonary:     Effort: Pulmonary effort is normal. No respiratory distress.  Abdominal:     General: Abdomen is flat. There is no distension.     Palpations: Abdomen is soft. There is no mass.     Tenderness: There is no abdominal tenderness. There is no right CVA tenderness, left CVA tenderness, guarding or rebound.     Hernia: No hernia is present.  Musculoskeletal:     Cervical back: No muscular tenderness.  Lymphadenopathy:     Cervical: No cervical adenopathy.  Skin:    General: Skin is warm and dry.     Capillary Refill: Capillary refill takes less than 2 seconds.     Coloration: Skin is not jaundiced or pale.     Findings: No bruising, erythema, lesion or rash.  Neurological:     General: No focal deficit present.     Mental Status: She is alert. Mental status is at baseline.  Psychiatric:        Mood and Affect: Mood normal.        Behavior: Behavior normal.        Thought Content: Thought content normal.        Judgment: Judgment normal.    Musculoskeletal:  Exam found Decreased ROM, Tissue texture changes, Tenderness to palpation and Asymmetry of patient's  head, neck, thorax, ribs, lumbar, pelvis, sacrum and abdomen Osteopathic Structural Exam:   Head: OAESSR,  hypertonic suboccipital muscles, posterior and superior R temporal, R torsion, decreased motion on R side  Neck: C3ERR, C4ESRR,   Thorax: T3-5SRRL  Ribs: Ribs 5-6 locked up on the R  Lumbar: QL hypertonic on the R  Pelvis: Posterior R innominate  Sacrum: R on R torsion  Abdomen: diaphragm hypertonic on the L  Results for orders placed or performed in visit on 11/12/19  CBC with Differential OUT  Result Value Ref Range   WBC 8.6 3.4 - 10.8 x10E3/uL   RBC 4.17 3.77 - 5.28 x10E6/uL   Hemoglobin 13.9 11.1 - 15.9 g/dL   Hematocrit 40.9 34.0 - 46.6 %   MCV 98 (H) 79 - 97 fL   MCH 33.3 (H) 26.6 - 33.0 pg   MCHC 34.0 31.5 - 35.7 g/dL   RDW 11.6 (L) 11.7 - 15.4 %   Platelets 328 150 - 450 x10E3/uL   Neutrophils 69 Not Estab. %   Lymphs 22 Not Estab. %   Monocytes 5 Not Estab. %   Eos 3 Not Estab. %   Basos 1 Not Estab. %   Neutrophils Absolute 5.9 1.4 - 7.0 x10E3/uL   Lymphocytes Absolute 1.9 0.7 - 3.1 x10E3/uL   Monocytes Absolute 0.5 0.1 - 0.9 x10E3/uL   EOS (ABSOLUTE) 0.2 0.0 - 0.4 x10E3/uL   Basophils Absolute 0.0 0.0 - 0.2 x10E3/uL   Immature Granulocytes 0 Not Estab. %   Immature Grans (Abs) 0.0 0.0 - 0.1 x10E3/uL  Comp Met (CMET)  Result Value Ref Range   Glucose 95 65 - 99 mg/dL   BUN 7 6 - 20 mg/dL   Creatinine, Ser 0.69 0.57 - 1.00 mg/dL   GFR calc non Af Amer 114 >59 mL/min/1.73   GFR calc Af Amer 131 >59 mL/min/1.73   BUN/Creatinine Ratio 10 9 - 23   Sodium 139 134 - 144 mmol/L   Potassium 4.3 3.5 - 5.2 mmol/L   Chloride 103 96 - 106 mmol/L   CO2 23 20 - 29 mmol/L   Calcium 9.7 8.7 - 10.2 mg/dL   Total Protein 7.3 6.0 - 8.5 g/dL   Albumin 4.5 3.8 - 4.8 g/dL   Globulin, Total 2.8 1.5 - 4.5 g/dL   Albumin/Globulin Ratio 1.6 1.2 - 2.2   Bilirubin Total 0.3 0.0 - 1.2 mg/dL   Alkaline Phosphatase 67 39 - 117 IU/L   AST 17 0 - 40 IU/L   ALT 29 0 - 32 IU/L  Lipid Panel w/o Chol/HDL Ratio  OUT  Result Value Ref Range   Cholesterol, Total 192 100 - 199 mg/dL    Triglycerides 177 (H) 0 - 149 mg/dL   HDL 66 >39 mg/dL   VLDL Cholesterol Cal 30 5 - 40 mg/dL   LDL Chol Calc (NIH) 96 0 - 99 mg/dL  TSH  Result Value Ref Range   TSH 1.530 0.450 - 4.500 uIU/mL  UA/M w/rflx Culture, Routine   Specimen: Urine   URINE  Result Value Ref Range   Specific Gravity, UA <1.005 (L) 1.005 - 1.030   pH, UA 5.5 5.0 - 7.5   Color, UA Yellow Yellow   Appearance Ur Clear Clear   Leukocytes,UA Negative Negative   Protein,UA Negative Negative/Trace   Glucose, UA Negative Negative   Ketones, UA Negative Negative   RBC, UA Negative Negative   Bilirubin, UA Negative Negative   Urobilinogen, Ur 0.2 0.2 - 1.0 mg/dL   Nitrite, UA Negative Negative      Assessment & Plan:   Problem List Items Addressed This Visit    None    Visit Diagnoses    Neck pain    -  Primary   In mild exacerbation. She does have somatic dysfunction that is contributing to her symptoms. Treated today with good results as below.    Head region somatic dysfunction       Thoracic segment dysfunction       Cervical (neck) region somatic dysfunction       Somatic dysfunction of sacral region       Somatic dysfunction of pelvis region       Somatic dysfunction of rib region       Somatic dysfunction of lumbar region       Segmental dysfunction of abdomen         After verbal consent was obtained, patient was treated today with osteopathic manipulative medicine to the regions of the head, neck, thorax, ribs, lumbar, pelvis, sacrum and abdomen using the techniques of cranial, myofascial release, counterstrain, muscle energy, HVLA and soft tissue. Areas of compensation relating to her primary pain source also treated. Patient tolerated the procedure well with good objective and good subjective improvement in symptoms. She left the room in good condition. She was advised to stay well hydrated and that she may have some soreness following the procedure. If not improving or worsening, she will call and  come in. She will return for reevaluation   in 2-3 weeks.   Follow up plan: Return in about 3 weeks (around 12/14/2019).

## 2019-12-13 ENCOUNTER — Ambulatory Visit (INDEPENDENT_AMBULATORY_CARE_PROVIDER_SITE_OTHER): Payer: BC Managed Care – PPO | Admitting: Family Medicine

## 2019-12-13 ENCOUNTER — Other Ambulatory Visit: Payer: Self-pay

## 2019-12-13 ENCOUNTER — Encounter: Payer: Self-pay | Admitting: Family Medicine

## 2019-12-13 VITALS — BP 126/74 | HR 113 | Temp 98.7°F

## 2019-12-13 DIAGNOSIS — M9908 Segmental and somatic dysfunction of rib cage: Secondary | ICD-10-CM | POA: Diagnosis not present

## 2019-12-13 DIAGNOSIS — M9902 Segmental and somatic dysfunction of thoracic region: Secondary | ICD-10-CM | POA: Diagnosis not present

## 2019-12-13 DIAGNOSIS — M542 Cervicalgia: Secondary | ICD-10-CM | POA: Diagnosis not present

## 2019-12-13 DIAGNOSIS — M9900 Segmental and somatic dysfunction of head region: Secondary | ICD-10-CM

## 2019-12-13 DIAGNOSIS — M9901 Segmental and somatic dysfunction of cervical region: Secondary | ICD-10-CM

## 2019-12-13 NOTE — Progress Notes (Signed)
BP 126/74   Pulse (!) 113   Temp 98.7 F (37.1 C)   LMP 11/22/2019   SpO2 100%    Subjective:    Patient ID: Karla Huber, female    DOB: Aug 22, 1986, 34 y.o.   MRN: [id number]  HPI: Karla Huber is a 34 y.o. female  Chief Complaint  Patient presents with  . Neck Pain   Karla Huber presents today not feeling well. She notes that she is on day 2 of a headache. Her neck is hurting. It's been radiating into her head and making her feel badly. She notes that she has been feeling bad in the AM and then it's getting better over the course of the day. Her pain is still in the neck primarily. It radiates into the back of her head and is giving her a headache. It's been going on for a few days. Nothing is making it better or worse. It's aching and sore in nature. She notes that her back is doing OK. She is otherwise doing well with no other concerns or complaints at this time.   Relevant past medical, surgical, family and social history reviewed and updated as indicated. Interim medical history since our last visit reviewed. Allergies and medications reviewed and updated.  Review of Systems  Constitutional: Negative.   Respiratory: Negative.   Musculoskeletal: Positive for myalgias, neck pain and neck stiffness. Negative for arthralgias, back pain, gait problem and joint swelling.  Neurological: Negative.   Psychiatric/Behavioral: Negative.     Per HPI unless specifically indicated above     Objective:    BP 126/74   Pulse (!) 113   Temp 98.7 F (37.1 C)   LMP 11/22/2019   SpO2 100%   Wt Readings from Last 3 Encounters:  11/23/19 187 lb (84.8 kg)  11/12/19 186 lb 9.6 oz (84.6 kg)  09/15/19 184 lb 12.8 oz (83.8 kg)    Physical Exam Vitals and nursing note reviewed.  Constitutional:      General: She is not in acute distress.    Appearance: Normal appearance. She is not ill-appearing.  HENT:     Head: Normocephalic and atraumatic.     Right Ear: External ear normal.   Left Ear: External ear normal.     Nose: Nose normal.     Mouth/Throat:     Mouth: Mucous membranes are moist.     Pharynx: Oropharynx is clear.  Eyes:     Extraocular Movements: Extraocular movements intact.     Conjunctiva/sclera: Conjunctivae normal.     Pupils: Pupils are equal, round, and reactive to light.  Neck:     Vascular: No carotid bruit.  Cardiovascular:     Rate and Rhythm: Normal rate.     Pulses: Normal pulses.  Pulmonary:     Effort: Pulmonary effort is normal. No respiratory distress.  Abdominal:     General: Abdomen is flat. There is no distension.     Palpations: Abdomen is soft. There is no mass.     Tenderness: There is no abdominal tenderness. There is no right CVA tenderness, left CVA tenderness, guarding or rebound.     Hernia: No hernia is present.  Musculoskeletal:     Cervical back: No muscular tenderness.  Lymphadenopathy:     Cervical: No cervical adenopathy.  Skin:    General: Skin is warm and dry.     Capillary Refill: Capillary refill takes less than 2 seconds.     Coloration: Skin is not jaundiced or pale.  Findings: No bruising, erythema, lesion or rash.  Neurological:     General: No focal deficit present.     Mental Status: She is alert. Mental status is at baseline.  Psychiatric:        Mood and Affect: Mood normal.        Behavior: Behavior normal.        Thought Content: Thought content normal.        Judgment: Judgment normal.   Musculoskeletal:  Exam found Decreased ROM, Tissue texture changes, Tenderness to palpation and Asymmetry of patient's  head, neck, thorax and ribs Osteopathic Structural Exam:   Head: OAESSR, R torsion, R temporal internally roated  Neck: C3-4ESRR, hypertonic R tram and scalenes  Thorax: T3-5SLRR  Ribs: Ribs 4-6 Locked up on the R   Results for orders placed or performed in visit on 11/12/19  CBC with Differential OUT  Result Value Ref Range   WBC 8.6 3.4 - 10.8 x10E3/uL   RBC 4.17 3.77 - 5.28  x10E6/uL   Hemoglobin 13.9 11.1 - 15.9 g/dL   Hematocrit 40.9 34.0 - 46.6 %   MCV 98 (H) 79 - 97 fL   MCH 33.3 (H) 26.6 - 33.0 pg   MCHC 34.0 31.5 - 35.7 g/dL   RDW 11.6 (L) 11.7 - 15.4 %   Platelets 328 150 - 450 x10E3/uL   Neutrophils 69 Not Estab. %   Lymphs 22 Not Estab. %   Monocytes 5 Not Estab. %   Eos 3 Not Estab. %   Basos 1 Not Estab. %   Neutrophils Absolute 5.9 1.4 - 7.0 x10E3/uL   Lymphocytes Absolute 1.9 0.7 - 3.1 x10E3/uL   Monocytes Absolute 0.5 0.1 - 0.9 x10E3/uL   EOS (ABSOLUTE) 0.2 0.0 - 0.4 x10E3/uL   Basophils Absolute 0.0 0.0 - 0.2 x10E3/uL   Immature Granulocytes 0 Not Estab. %   Immature Grans (Abs) 0.0 0.0 - 0.1 x10E3/uL  Comp Met (CMET)  Result Value Ref Range   Glucose 95 65 - 99 mg/dL   BUN 7 6 - 20 mg/dL   Creatinine, Ser 0.69 0.57 - 1.00 mg/dL   GFR calc non Af Amer 114 >59 mL/min/1.73   GFR calc Af Amer 131 >59 mL/min/1.73   BUN/Creatinine Ratio 10 9 - 23   Sodium 139 134 - 144 mmol/L   Potassium 4.3 3.5 - 5.2 mmol/L   Chloride 103 96 - 106 mmol/L   CO2 23 20 - 29 mmol/L   Calcium 9.7 8.7 - 10.2 mg/dL   Total Protein 7.3 6.0 - 8.5 g/dL   Albumin 4.5 3.8 - 4.8 g/dL   Globulin, Total 2.8 1.5 - 4.5 g/dL   Albumin/Globulin Ratio 1.6 1.2 - 2.2   Bilirubin Total 0.3 0.0 - 1.2 mg/dL   Alkaline Phosphatase 67 39 - 117 IU/L   AST 17 0 - 40 IU/L   ALT 29 0 - 32 IU/L  Lipid Panel w/o Chol/HDL Ratio OUT  Result Value Ref Range   Cholesterol, Total 192 100 - 199 mg/dL   Triglycerides 177 (H) 0 - 149 mg/dL   HDL 66 >39 mg/dL   VLDL Cholesterol Cal 30 5 - 40 mg/dL   LDL Chol Calc (NIH) 96 0 - 99 mg/dL  TSH  Result Value Ref Range   TSH 1.530 0.450 - 4.500 uIU/mL  UA/M w/rflx Culture, Routine   Specimen: Urine   URINE  Result Value Ref Range   Specific Gravity, UA <1.005 (L) 1.005 - 1.030  pH, UA 5.5 5.0 - 7.5   Color, UA Yellow Yellow   Appearance Ur Clear Clear   Leukocytes,UA Negative Negative   Protein,UA Negative Negative/Trace    Glucose, UA Negative Negative   Ketones, UA Negative Negative   RBC, UA Negative Negative   Bilirubin, UA Negative Negative   Urobilinogen, Ur 0.2 0.2 - 1.0 mg/dL   Nitrite, UA Negative Negative      Assessment & Plan:   Problem List Items Addressed This Visit    None    Visit Diagnoses    Neck pain    -  Primary   In acute exacerbation. She does have somatic dysfunction that is contributing to her symptoms. Treated today with good results as below.    Head region somatic dysfunction       Thoracic segment dysfunction       Cervical (neck) region somatic dysfunction       Somatic dysfunction of rib region         After verbal consent was obtained, patient was treated today with osteopathic manipulative medicine to the regions of the head, neck, thorax and ribs using the techniques of cranial, HVLA and soft tissue. Areas of compensation relating to her primary pain source also treated. Patient tolerated the procedure well with good objective and good subjective improvement in symptoms. She left the room in good condition. She was advised to stay well hydrated and that she may have some soreness following the procedure. If not improving or worsening, she will call and come in. She will return for reevaluation  5 days given severity of symptoms.   Follow up plan: Return Friday.

## 2019-12-17 ENCOUNTER — Ambulatory Visit (INDEPENDENT_AMBULATORY_CARE_PROVIDER_SITE_OTHER): Payer: BC Managed Care – PPO | Admitting: Family Medicine

## 2019-12-17 ENCOUNTER — Other Ambulatory Visit: Payer: Self-pay

## 2019-12-17 ENCOUNTER — Encounter: Payer: Self-pay | Admitting: Family Medicine

## 2019-12-17 VITALS — BP 116/67 | HR 97 | Temp 98.5°F

## 2019-12-17 DIAGNOSIS — M542 Cervicalgia: Secondary | ICD-10-CM | POA: Diagnosis not present

## 2019-12-17 DIAGNOSIS — M9909 Segmental and somatic dysfunction of abdomen and other regions: Secondary | ICD-10-CM | POA: Diagnosis not present

## 2019-12-17 DIAGNOSIS — M9902 Segmental and somatic dysfunction of thoracic region: Secondary | ICD-10-CM | POA: Diagnosis not present

## 2019-12-17 DIAGNOSIS — M9900 Segmental and somatic dysfunction of head region: Secondary | ICD-10-CM

## 2019-12-17 DIAGNOSIS — M9904 Segmental and somatic dysfunction of sacral region: Secondary | ICD-10-CM

## 2019-12-17 DIAGNOSIS — M9906 Segmental and somatic dysfunction of lower extremity: Secondary | ICD-10-CM

## 2019-12-17 DIAGNOSIS — M9903 Segmental and somatic dysfunction of lumbar region: Secondary | ICD-10-CM

## 2019-12-17 DIAGNOSIS — M9905 Segmental and somatic dysfunction of pelvic region: Secondary | ICD-10-CM

## 2019-12-17 DIAGNOSIS — M9901 Segmental and somatic dysfunction of cervical region: Secondary | ICD-10-CM

## 2019-12-17 DIAGNOSIS — M9908 Segmental and somatic dysfunction of rib cage: Secondary | ICD-10-CM

## 2019-12-17 NOTE — Progress Notes (Signed)
BP 116/67 (BP Location: Left Arm, Patient Position: Sitting, Cuff Size: Normal)   Pulse 97   Temp 98.5 F (36.9 C) (Oral)   LMP 11/22/2019   SpO2 91%    Subjective:    Patient ID: Karla Huber, female    DOB: 06/19/1986, 34 y.o.   MRN: [id number]  HPI: Karla Huber is a 34 y.o. female  Chief Complaint  Patient presents with  . Neck Pain   Karla Huber presents today for evaluation and possible treatment of her neck pain with OMT. She notes that she did well following her last treatment, but that she has continued with a dull headache since then. She notes that her head is still hurting. It seems like it comes from her neck and goes into the back of her head. It's aching and sore. She notes that it feels like it should pop, but it can't. Nothing seems to make it better or worse, although it was a little better after her treatment on Monday. She also notes that she tripped on her son's monster truck a couple of days ago and jammed her R ankle- which she has broken previously. It's aching and sore. She is able to walk on it but it doesn't seem to be getting better. She is otherwise feeling well with no other concerns or complaints at this time.    Relevant past medical, surgical, family and social history reviewed and updated as indicated. Interim medical history since our last visit reviewed. Allergies and medications reviewed and updated.  Review of Systems  Constitutional: Negative.   Respiratory: Negative.   Cardiovascular: Negative.   Gastrointestinal: Negative.   Musculoskeletal: Positive for myalgias, neck pain and neck stiffness. Negative for arthralgias, back pain, gait problem and joint swelling.  Skin: Negative.   Neurological: Positive for headaches. Negative for dizziness, tremors, seizures, syncope, facial asymmetry, speech difficulty, weakness, light-headedness and numbness.  Psychiatric/Behavioral: Negative.     Per HPI unless specifically indicated above       Objective:    BP 116/67 (BP Location: Left Arm, Patient Position: Sitting, Cuff Size: Normal)   Pulse 97   Temp 98.5 F (36.9 C) (Oral)   LMP 11/22/2019   SpO2 91%   Wt Readings from Last 3 Encounters:  11/23/19 187 lb (84.8 kg)  11/12/19 186 lb 9.6 oz (84.6 kg)  09/15/19 184 lb 12.8 oz (83.8 kg)    Physical Exam Vitals and nursing note reviewed.  Constitutional:      General: She is not in acute distress.    Appearance: Normal appearance. She is not ill-appearing.  HENT:     Head: Normocephalic and atraumatic.     Right Ear: External ear normal.     Left Ear: External ear normal.     Nose: Nose normal.     Mouth/Throat:     Mouth: Mucous membranes are moist.     Pharynx: Oropharynx is clear.  Eyes:     Extraocular Movements: Extraocular movements intact.     Conjunctiva/sclera: Conjunctivae normal.     Pupils: Pupils are equal, round, and reactive to light.  Neck:     Vascular: No carotid bruit.  Cardiovascular:     Rate and Rhythm: Normal rate.     Pulses: Normal pulses.  Pulmonary:     Effort: Pulmonary effort is normal. No respiratory distress.  Abdominal:     General: Abdomen is flat. There is no distension.     Palpations: Abdomen is soft. There is no mass.  Tenderness: There is no abdominal tenderness. There is no right CVA tenderness, left CVA tenderness, guarding or rebound.     Hernia: No hernia is present.  Musculoskeletal:     Cervical back: No muscular tenderness.  Lymphadenopathy:     Cervical: No cervical adenopathy.  Skin:    General: Skin is warm and dry.     Capillary Refill: Capillary refill takes less than 2 seconds.     Coloration: Skin is not jaundiced or pale.     Findings: No bruising, erythema, lesion or rash.  Neurological:     General: No focal deficit present.     Mental Status: She is alert. Mental status is at baseline.  Psychiatric:        Mood and Affect: Mood normal.        Behavior: Behavior normal.        Thought  Content: Thought content normal.        Judgment: Judgment normal.   Musculoskeletal:  Exam found Decreased ROM, Tissue texture changes, Tenderness to palpation and Asymmetry of patient's  head, neck, thorax, ribs, lumbar, pelvis, sacrum, lower extremity and abdomen Osteopathic Structural Exam:   Head: R lateral strain, OAESSR, R temporal internally rotated and anterior  Neck: C2 rotated R, C3-4ESRR, fascial and dural strain from cranial base to C4  Thorax: T4-6SRRL  Ribs: Rib 5 locked up on the L  Lumbar: QL hypertonic on the R, L4-5SLRR  Pelvis: Anterior R innominate  Sacrum: R on R torsion  Lower Extremity: R tibia on talus, anterior fibular head on the R,    Abdomen: diaphragm hypertonic on the R   Results for orders placed or performed in visit on 11/12/19  CBC with Differential OUT  Result Value Ref Range   WBC 8.6 3.4 - 10.8 x10E3/uL   RBC 4.17 3.77 - 5.28 x10E6/uL   Hemoglobin 13.9 11.1 - 15.9 g/dL   Hematocrit 40.9 34.0 - 46.6 %   MCV 98 (H) 79 - 97 fL   MCH 33.3 (H) 26.6 - 33.0 pg   MCHC 34.0 31.5 - 35.7 g/dL   RDW 11.6 (L) 11.7 - 15.4 %   Platelets 328 150 - 450 x10E3/uL   Neutrophils 69 Not Estab. %   Lymphs 22 Not Estab. %   Monocytes 5 Not Estab. %   Eos 3 Not Estab. %   Basos 1 Not Estab. %   Neutrophils Absolute 5.9 1.4 - 7.0 x10E3/uL   Lymphocytes Absolute 1.9 0.7 - 3.1 x10E3/uL   Monocytes Absolute 0.5 0.1 - 0.9 x10E3/uL   EOS (ABSOLUTE) 0.2 0.0 - 0.4 x10E3/uL   Basophils Absolute 0.0 0.0 - 0.2 x10E3/uL   Immature Granulocytes 0 Not Estab. %   Immature Grans (Abs) 0.0 0.0 - 0.1 x10E3/uL  Comp Met (CMET)  Result Value Ref Range   Glucose 95 65 - 99 mg/dL   BUN 7 6 - 20 mg/dL   Creatinine, Ser 0.69 0.57 - 1.00 mg/dL   GFR calc non Af Amer 114 >59 mL/min/1.73   GFR calc Af Amer 131 >59 mL/min/1.73   BUN/Creatinine Ratio 10 9 - 23   Sodium 139 134 - 144 mmol/L   Potassium 4.3 3.5 - 5.2 mmol/L   Chloride 103 96 - 106 mmol/L   CO2 23 20 - 29 mmol/L    Calcium 9.7 8.7 - 10.2 mg/dL   Total Protein 7.3 6.0 - 8.5 g/dL   Albumin 4.5 3.8 - 4.8 g/dL   Globulin, Total 2.8 1.5 -  4.5 g/dL   Albumin/Globulin Ratio 1.6 1.2 - 2.2   Bilirubin Total 0.3 0.0 - 1.2 mg/dL   Alkaline Phosphatase 67 39 - 117 IU/L   AST 17 0 - 40 IU/L   ALT 29 0 - 32 IU/L  Lipid Panel w/o Chol/HDL Ratio OUT  Result Value Ref Range   Cholesterol, Total 192 100 - 199 mg/dL   Triglycerides 177 (H) 0 - 149 mg/dL   HDL 66 >39 mg/dL   VLDL Cholesterol Cal 30 5 - 40 mg/dL   LDL Chol Calc (NIH) 96 0 - 99 mg/dL  TSH  Result Value Ref Range   TSH 1.530 0.450 - 4.500 uIU/mL  UA/M w/rflx Culture, Routine   Specimen: Urine   URINE  Result Value Ref Range   Specific Gravity, UA <1.005 (L) 1.005 - 1.030   pH, UA 5.5 5.0 - 7.5   Color, UA Yellow Yellow   Appearance Ur Clear Clear   Leukocytes,UA Negative Negative   Protein,UA Negative Negative/Trace   Glucose, UA Negative Negative   Ketones, UA Negative Negative   RBC, UA Negative Negative   Bilirubin, UA Negative Negative   Urobilinogen, Ur 0.2 0.2 - 1.0 mg/dL   Nitrite, UA Negative Negative      Assessment & Plan:   Problem List Items Addressed This Visit    None    Visit Diagnoses    Neck pain    -  Primary   Improved from prior, but continues with headache. She does have somatic dysfuntion contributing to her symptoms. Treated today with good results as below.    Head region somatic dysfunction       Thoracic segment dysfunction       Cervical (neck) region somatic dysfunction       Somatic dysfunction of rib region       Somatic dysfunction of sacral region       Somatic dysfunction of pelvis region       Somatic dysfunction of lumbar region       Segmental dysfunction of abdomen       Somatic dysfunction of lower extremities         After verbal consent was obtained, patient was treated today with osteopathic manipulative medicine to the regions of the head, neck, thorax, ribs, lumbar, pelvis, sacrum,  abdomen and lower extremity using the techniques of cranial, myofascial release, counterstrain, muscle energy, HVLA and soft tissue. Areas of compensation relating to her primary pain source also treated. Patient tolerated the procedure well with good objective and good subjective improvement in symptoms. She left the room in good condition. She was advised to stay well hydrated and that she may have some soreness following the procedure. If not improving or worsening, she will call and come in. She will return for reevaluation   in 2-3 weeks.   Follow up plan: Return if symptoms worsen or fail to improve.

## 2019-12-21 ENCOUNTER — Encounter: Payer: Self-pay | Admitting: Family Medicine

## 2019-12-23 ENCOUNTER — Other Ambulatory Visit: Payer: Self-pay | Admitting: Family Medicine

## 2019-12-23 ENCOUNTER — Encounter: Payer: Self-pay | Admitting: Family Medicine

## 2019-12-23 DIAGNOSIS — K909 Intestinal malabsorption, unspecified: Secondary | ICD-10-CM

## 2020-01-04 ENCOUNTER — Encounter: Payer: Self-pay | Admitting: Family Medicine

## 2020-01-04 ENCOUNTER — Ambulatory Visit (INDEPENDENT_AMBULATORY_CARE_PROVIDER_SITE_OTHER): Payer: BC Managed Care – PPO | Admitting: Family Medicine

## 2020-01-04 ENCOUNTER — Other Ambulatory Visit: Payer: Self-pay

## 2020-01-04 VITALS — BP 117/69 | HR 75 | Temp 98.0°F

## 2020-01-04 DIAGNOSIS — M9908 Segmental and somatic dysfunction of rib cage: Secondary | ICD-10-CM

## 2020-01-04 DIAGNOSIS — M9904 Segmental and somatic dysfunction of sacral region: Secondary | ICD-10-CM

## 2020-01-04 DIAGNOSIS — M9903 Segmental and somatic dysfunction of lumbar region: Secondary | ICD-10-CM

## 2020-01-04 DIAGNOSIS — M9901 Segmental and somatic dysfunction of cervical region: Secondary | ICD-10-CM

## 2020-01-04 DIAGNOSIS — M9900 Segmental and somatic dysfunction of head region: Secondary | ICD-10-CM | POA: Diagnosis not present

## 2020-01-04 DIAGNOSIS — M9905 Segmental and somatic dysfunction of pelvic region: Secondary | ICD-10-CM | POA: Diagnosis not present

## 2020-01-04 DIAGNOSIS — M9902 Segmental and somatic dysfunction of thoracic region: Secondary | ICD-10-CM

## 2020-01-04 DIAGNOSIS — M542 Cervicalgia: Secondary | ICD-10-CM

## 2020-01-04 NOTE — Progress Notes (Signed)
BP 117/69 (BP Location: Left Arm, Patient Position: Sitting, Cuff Size: Normal)   Pulse 75   Temp 98 F (36.7 C) (Oral)   SpO2 98%    Subjective:    Patient ID: Karla Huber, female    DOB: [birth year], 34 y.o.   MRN: [id number]  HPI: Karla Huber is a 34 y.o. female  Chief Complaint  Patient presents with  . Neck Pain    omm   Francheska presents today for evaluation and possible treatment with OMT for her neck pain. She states that she has been doing better. She notes that she is feeling better. Her pain is aching and sore. Better with OMT and worse with carrying her children and stress. Pain will radiate into her head. She notes that she is otherwise feeling well with no other concerns or complaints at this time.    Relevant past medical, surgical, family and social history reviewed and updated as indicated. Interim medical history since our last visit reviewed. Allergies and medications reviewed and updated.  Review of Systems  Constitutional: Negative.   Respiratory: Negative.   Cardiovascular: Negative.   Gastrointestinal: Negative.   Musculoskeletal: Positive for myalgias, neck pain and neck stiffness. Negative for arthralgias, back pain, gait problem and joint swelling.  Skin: Negative.   Neurological: Negative.   Hematological: Negative.   Psychiatric/Behavioral: Negative.     Per HPI unless specifically indicated above     Objective:    BP 117/69 (BP Location: Left Arm, Patient Position: Sitting, Cuff Size: Normal)   Pulse 75   Temp 98 F (36.7 C) (Oral)   SpO2 98%   Wt Readings from Last 3 Encounters:  11/23/19 187 lb (84.8 kg)  11/12/19 186 lb 9.6 oz (84.6 kg)  09/15/19 184 lb 12.8 oz (83.8 kg)    Physical Exam Vitals and nursing note reviewed.  Constitutional:      General: She is not in acute distress.    Appearance: Normal appearance. She is not ill-appearing.  HENT:     Head: Normocephalic and atraumatic.     Right Ear: External ear normal.      Left Ear: External ear normal.     Nose: Nose normal.     Mouth/Throat:     Mouth: Mucous membranes are moist.     Pharynx: Oropharynx is clear.  Eyes:     Extraocular Movements: Extraocular movements intact.     Conjunctiva/sclera: Conjunctivae normal.     Pupils: Pupils are equal, round, and reactive to light.  Neck:     Vascular: No carotid bruit.  Cardiovascular:     Rate and Rhythm: Normal rate.     Pulses: Normal pulses.  Pulmonary:     Effort: Pulmonary effort is normal. No respiratory distress.  Abdominal:     General: Abdomen is flat. There is no distension.     Palpations: Abdomen is soft. There is no mass.     Tenderness: There is no abdominal tenderness. There is no right CVA tenderness, left CVA tenderness, guarding or rebound.     Hernia: No hernia is present.  Musculoskeletal:     Cervical back: No muscular tenderness.  Lymphadenopathy:     Cervical: No cervical adenopathy.  Skin:    General: Skin is warm and dry.     Capillary Refill: Capillary refill takes less than 2 seconds.     Coloration: Skin is not jaundiced or pale.     Findings: No bruising, erythema, lesion or rash.  Neurological:  General: No focal deficit present.     Mental Status: She is alert. Mental status is at baseline.  Psychiatric:        Mood and Affect: Mood normal.        Behavior: Behavior normal.        Thought Content: Thought content normal.        Judgment: Judgment normal.   Musculoskeletal:  Exam found Decreased ROM, Tissue texture changes, Tenderness to palpation and Asymmetry of patient's  head, neck, thorax, ribs, lumbar, pelvis and sacrum Osteopathic Structural Exam:   Head: OAESSR, hypertonic suboccipital muscles  Neck: SCM hypertonic on the R, C4ESRR  Thorax: T4-5SRRL  Ribs: Ribs 4-9 locked up on the L  Lumbar: QL hypertonic on the R, psoas hypertonic on the R  Pelvis: Posterior R innominate  Sacrum: R on R torsion   Results for orders placed or performed in  visit on 11/12/19  CBC with Differential OUT  Result Value Ref Range   WBC 8.6 3.4 - 10.8 x10E3/uL   RBC 4.17 3.77 - 5.28 x10E6/uL   Hemoglobin 13.9 11.1 - 15.9 g/dL   Hematocrit 40.9 34.0 - 46.6 %   MCV 98 (H) 79 - 97 fL   MCH 33.3 (H) 26.6 - 33.0 pg   MCHC 34.0 31.5 - 35.7 g/dL   RDW 11.6 (L) 11.7 - 15.4 %   Platelets 328 150 - 450 x10E3/uL   Neutrophils 69 Not Estab. %   Lymphs 22 Not Estab. %   Monocytes 5 Not Estab. %   Eos 3 Not Estab. %   Basos 1 Not Estab. %   Neutrophils Absolute 5.9 1.4 - 7.0 x10E3/uL   Lymphocytes Absolute 1.9 0.7 - 3.1 x10E3/uL   Monocytes Absolute 0.5 0.1 - 0.9 x10E3/uL   EOS (ABSOLUTE) 0.2 0.0 - 0.4 x10E3/uL   Basophils Absolute 0.0 0.0 - 0.2 x10E3/uL   Immature Granulocytes 0 Not Estab. %   Immature Grans (Abs) 0.0 0.0 - 0.1 x10E3/uL  Comp Met (CMET)  Result Value Ref Range   Glucose 95 65 - 99 mg/dL   BUN 7 6 - 20 mg/dL   Creatinine, Ser 0.69 0.57 - 1.00 mg/dL   GFR calc non Af Amer 114 >59 mL/min/1.73   GFR calc Af Amer 131 >59 mL/min/1.73   BUN/Creatinine Ratio 10 9 - 23   Sodium 139 134 - 144 mmol/L   Potassium 4.3 3.5 - 5.2 mmol/L   Chloride 103 96 - 106 mmol/L   CO2 23 20 - 29 mmol/L   Calcium 9.7 8.7 - 10.2 mg/dL   Total Protein 7.3 6.0 - 8.5 g/dL   Albumin 4.5 3.8 - 4.8 g/dL   Globulin, Total 2.8 1.5 - 4.5 g/dL   Albumin/Globulin Ratio 1.6 1.2 - 2.2   Bilirubin Total 0.3 0.0 - 1.2 mg/dL   Alkaline Phosphatase 67 39 - 117 IU/L   AST 17 0 - 40 IU/L   ALT 29 0 - 32 IU/L  Lipid Panel w/o Chol/HDL Ratio OUT  Result Value Ref Range   Cholesterol, Total 192 100 - 199 mg/dL   Triglycerides 177 (H) 0 - 149 mg/dL   HDL 66 >39 mg/dL   VLDL Cholesterol Cal 30 5 - 40 mg/dL   LDL Chol Calc (NIH) 96 0 - 99 mg/dL  TSH  Result Value Ref Range   TSH 1.530 0.450 - 4.500 uIU/mL  UA/M w/rflx Culture, Routine   Specimen: Urine   URINE  Result Value Ref Range  Specific Gravity, UA <1.005 (L) 1.005 - 1.030   pH, UA 5.5 5.0 - 7.5   Color,  UA Yellow Yellow   Appearance Ur Clear Clear   Leukocytes,UA Negative Negative   Protein,UA Negative Negative/Trace   Glucose, UA Negative Negative   Ketones, UA Negative Negative   RBC, UA Negative Negative   Bilirubin, UA Negative Negative   Urobilinogen, Ur 0.2 0.2 - 1.0 mg/dL   Nitrite, UA Negative Negative      Assessment & Plan:   Problem List Items Addressed This Visit    None    Visit Diagnoses    Neck pain    -  Primary   Improving. She does still have some somatic dysfunctions that are contributing to her symptoms. Treated today with good results as below.    Head region somatic dysfunction       Thoracic segment dysfunction       Cervical (neck) region somatic dysfunction       Somatic dysfunction of rib region       Somatic dysfunction of sacral region       Somatic dysfunction of pelvis region       Somatic dysfunction of lumbar region         After verbal consent was obtained, patient was treated today with osteopathic manipulative medicine to the regions of the head, neck, thorax, ribs, lumbar, pelvis and sacrum using the techniques of cranial, myofascial release, counterstrain, muscle energy, HVLA and soft tissue. Areas of compensation relating to her primary pain source also treated. Patient tolerated the procedure well with good objective and good subjective improvement in symptoms. She left the room in good condition. She was advised to stay well hydrated and that she may have some soreness following the procedure. If not improving or worsening, she will call and come in. She will return for reevaluation  in 1-2 months.   Follow up plan: Return if symptoms worsen or fail to improve.

## 2020-01-05 ENCOUNTER — Encounter: Payer: Self-pay | Admitting: Family Medicine

## 2020-01-11 ENCOUNTER — Other Ambulatory Visit: Payer: Self-pay | Admitting: Family

## 2020-01-11 DIAGNOSIS — D508 Other iron deficiency anemias: Secondary | ICD-10-CM | POA: Diagnosis not present

## 2020-01-12 ENCOUNTER — Inpatient Hospital Stay: Payer: BC Managed Care – PPO

## 2020-01-12 ENCOUNTER — Inpatient Hospital Stay: Payer: BC Managed Care – PPO | Attending: Family | Admitting: Family

## 2020-02-04 ENCOUNTER — Ambulatory Visit (INDEPENDENT_AMBULATORY_CARE_PROVIDER_SITE_OTHER): Payer: BC Managed Care – PPO | Admitting: Family Medicine

## 2020-02-04 ENCOUNTER — Other Ambulatory Visit: Payer: Self-pay

## 2020-02-04 ENCOUNTER — Encounter: Payer: Self-pay | Admitting: Family Medicine

## 2020-02-04 VITALS — BP 111/69 | HR 83 | Temp 97.9°F | Ht 63.0 in | Wt 183.6 lb

## 2020-02-04 DIAGNOSIS — M25521 Pain in right elbow: Secondary | ICD-10-CM

## 2020-02-04 DIAGNOSIS — M9903 Segmental and somatic dysfunction of lumbar region: Secondary | ICD-10-CM

## 2020-02-04 DIAGNOSIS — M9902 Segmental and somatic dysfunction of thoracic region: Secondary | ICD-10-CM | POA: Diagnosis not present

## 2020-02-04 DIAGNOSIS — M9904 Segmental and somatic dysfunction of sacral region: Secondary | ICD-10-CM

## 2020-02-04 DIAGNOSIS — M9907 Segmental and somatic dysfunction of upper extremity: Secondary | ICD-10-CM

## 2020-02-04 DIAGNOSIS — M542 Cervicalgia: Secondary | ICD-10-CM

## 2020-02-04 DIAGNOSIS — M9901 Segmental and somatic dysfunction of cervical region: Secondary | ICD-10-CM

## 2020-02-04 DIAGNOSIS — M9908 Segmental and somatic dysfunction of rib cage: Secondary | ICD-10-CM

## 2020-02-04 DIAGNOSIS — M9900 Segmental and somatic dysfunction of head region: Secondary | ICD-10-CM

## 2020-02-04 DIAGNOSIS — M9905 Segmental and somatic dysfunction of pelvic region: Secondary | ICD-10-CM

## 2020-02-04 NOTE — Patient Instructions (Signed)
Cubital Tunnel Syndrome  Cubital tunnel syndrome is a condition that causes pain and weakness of the forearm and hand. It happens when one of the nerves that runs along the inside of the elbow joint (ulnar nerve) becomes irritated. This condition is usually caused by repeated arm motions that are done during sports or work-related activities. What are the causes? This condition may be caused by:  Increased pressure on the ulnar nerve at the elbow, arm, or forearm. This can result from: ? Irritation caused by repeated elbow bending. ? Poorly healed elbow fractures. ? Tumors in the elbow. These are usually noncancerous (benign). ? Scar tissue that develops in the elbow after an injury. ? Bony growths (spurs) near the ulnar nerve.  Stretching of the nerve due to loose elbow ligaments.  Trauma to the nerve at the elbow. What increases the risk? The following factors may make you more likely to develop this condition:  Doing manual labor that requires frequent bending of the elbow.  Playing sports that include repeated or strenuous throwing motions, such as baseball.  Playing contact sports, such as football or lacrosse.  Not warming up properly before activities.  Having diabetes.  Having an underactive thyroid (hypothyroidism). What are the signs or symptoms? Symptoms of this condition include:  Clumsiness or weakness of the hand.  Tenderness of the inner elbow.  Aching or soreness of the inner elbow, forearm, or fingers, especially the little finger or the ring finger.  Increased pain when forcing the elbow to bend.  Reduced control when throwing objects.  Tingling, numbness, or a burning feeling inside the forearm or in part of the hand or fingers, especially the little finger or the ring finger.  Sharp pains that shoot from the elbow down to the wrist and hand.  The inability to grip or pinch hard. How is this diagnosed? This condition is diagnosed based on:  Your  symptoms and medical history. Your health care provider will also ask for details about any injury.  A physical exam. You may also have tests, including:  Electromyogram (EMG). This test measures electrical signals sent by your nerves into the muscles.  Nerve conduction study. This test measures how well electrical signals pass through your nerves.  Imaging tests, such as X-rays, ultrasound, and MRI. These tests check for possible causes of your condition. How is this treated? This condition may be treated by:  Stopping the activities that are causing your symptoms to get worse.  Icing and taking medicines to reduce pain and swelling.  Wearing a splint to prevent your elbow from bending, or wearing an elbow pad where the ulnar nerve is closest to the skin.  Working with a physical therapist in less severe cases. This may help to: ? Decrease your symptoms. ? Improve the strength and range of motion of your elbow, forearm, and hand. If these treatments do not help, surgery may be needed. Follow these instructions at home: If you have a splint:  Wear the splint as told by your health care provider. Remove it only as told by your health care provider.  Loosen the splint if your fingers tingle, become numb, or turn cold and blue.  Keep the splint clean.  If the splint is not waterproof: ? Do not let it get wet. ? Cover it with a watertight covering when you take a bath or shower. Managing pain, stiffness, and swelling   If directed, put ice on the injured area: ? Put ice in a plastic bag. ?   Place a towel between your skin and the bag. ? Leave the ice on for 20 minutes, 2-3 times a day.  Move your fingers often to avoid stiffness and to lessen swelling.  Raise (elevate) the injured area above the level of your heart while you are sitting or lying down. General instructions  Take over-the-counter and prescription medicines only as told by your health care provider.  Do any  exercise or physical therapy as told by your health care provider.  Do not drive or use heavy machinery while taking prescription pain medicine.  If you were given an elbow pad, wear it as told by your health care provider.  Keep all follow-up visits as told by your health care provider. This is important. Contact a health care provider if:  Your symptoms get worse.  Your symptoms do not get better with treatment.  You have new pain.  Your hand on the injured side feels numb or cold. Summary  Cubital tunnel syndrome is a condition that causes pain and weakness of the forearm and hand.  You are more likely to develop this condition if you do work or play sports that involve repeated arm movements.  This condition is often treated by stopping repetitive activities, applying ice, and using anti-inflammatory medicines.  In rare cases, surgery may be needed. This information is not intended to replace advice given to you by your health care provider. Make sure you discuss any questions you have with your health care provider. Document Revised: 03/02/2018 Document Reviewed: 03/02/2018 Elsevier Patient Education  2020 [employer]  Elbow and Forearm Exercises Ask your health care provider which exercises are safe for you. Do exercises exactly as told by your health care provider and adjust them as directed. It is normal to feel mild stretching, pulling, tightness, or discomfort as you do these exercises. Stop right away if you feel sudden pain or your pain gets worse. Do not begin these exercises until told by your health care provider. Range-of-motion exercises These exercises warm up your muscles and joints and improve the movement and flexibility of your injured elbow and forearm. The exercises also help to relieve pain, numbness, and tingling. These exercises are done using the muscles in your injured elbow and forearm (active). Elbow flexion, active 1. Hold your left / right arm at  your side, and bend your elbow (flexion) as far as you can using only your arm muscles. 2. Hold this position for __________ seconds. 3. Slowly return to the starting position. Repeat __________ times. Complete this exercise __________ times a day. Elbow extension, active 1. Hold your left / right arm at your side, and straighten your elbow (extension) as much as you can using only your arm muscles. 2. Hold this position for __________ seconds. 3. Slowly return to the starting position. Repeat __________ times. Complete this exercise __________ times a day. Active forearm rotation, supination This is an exercise in which you turn (rotate) your forearm palm up (supination). 1. Stand or sit with your elbows at your sides. 2. Bend your left / right elbow to a 90-degree angle (right angle). 3. Rotate your palm up until you feel a gentle stretch on the inside of your forearm. 4. Hold this position for __________ seconds. 5. Slowly return to the starting position. Repeat __________ times. Complete this exercise __________ times a day. Active forearm rotation, pronation This is an exercise in which you turn (rotate) your forearm palm down (pronation). 1. Stand or sit with your elbows at  your sides. 2. Bend your left / right elbow to a 90-degree angle (right angle). 3. Rotate your palm down until you feel a gentle stretch on the top of your forearm. 4. Hold this position for __________ seconds. 5. Slowly return to the starting position. Repeat __________ times. Complete this exercise __________ times a day. Stretching exercises These exercises warm up your muscles and joints and improve the movement and flexibility of your injured elbow and forearm. These exercises also help to relieve pain, numbness, and tingling. These exercises are done using your healthy elbow and forearm to help stretch the muscles in your injured elbow and forearm (active-assisted). Elbow flexion, active-assisted  1. Hold  your left / right arm at your side, and bend your elbow (flexion) as much as you can using your left / right arm muscles. 2. Use your other hand to bend your left / right elbow farther. To do this, gently push up on your forearm until you feel a gentle stretch on the back of your elbow. 3. Hold this position for __________ seconds. 4. Slowly return to the starting position. Repeat __________ times. Complete this exercise __________ times a day. Elbow extension, active-assisted  1. Hold your left / right arm at your side, and straighten your elbow (extension) as much as you can using your left / right arm muscles. 2. Use your other hand to straighten the left / right elbow farther. To do this, gently push down on your forearm until you feel a gentle stretch on the inside of your elbow. 3. Hold this position for __________ seconds. 4. Slowly return to the starting position. Repeat __________ times. Complete this exercise __________ times a day. Active-assisted forearm rotation, supination This is an exercise in which you turn (rotate) your forearm palm up (supination). 1. Sit with your left / right elbow bent in a 90-degree angle (right angle) with your forearm resting on a table. 2. Keeping your upper body and shoulder still, rotate your forearm so your palm faces upward. 3. Use your other hand to help rotate your forearm further until you feel a gentle to moderate stretch. 4. Hold this position for __________ seconds. 5. Slowly release the stretch and return to the starting position. Repeat __________ times. Complete this exercise __________ times a day. Active-assisted forearm rotation, pronation This is an exercise in which you turn (rotate) your forearm palm down (pronation). 1. Sit with your left / right elbow bent in a 90-degree angle (right angle) with your forearm resting on a table. 2. Keeping your upper body and shoulder still, rotate your forearm so your palm faces the  tabletop. 3. Use your other hand to help rotate your forearm further until you feel a gentle to moderate stretch. 4. Hold this position for __________ seconds. 5. Slowly release the stretch and return to the starting position. Repeat __________ times. Complete this exercise __________ times a day. Passive elbow flexion, supine 1. Lie on your back (supine position). 2. Extend your left / right arm up in the air, bracing it with your other hand. 3. Let your left / right hand slowly lower toward your shoulder (passive flexion), while your elbow stays pointed toward the ceiling. You should feel a gentle stretch along the back of your upper arm and elbow. 4. If instructed by your health care provider, you may increase the intensity of your stretch by adding a small wrist weight or hand weight. 5. Hold this position for __________ seconds. 6. Slowly return to the starting  position. Repeat __________ times. Complete this exercise __________ times a day. Passive elbow extension, supine  1. Lie on your back (supine position). Make sure that you are in a comfortable position that lets you relax your arm muscles. 2. Place a folded towel under your left / right upper arm so your elbow and shoulder are at the same height. Straighten your left / right arm so your elbow does not rest on the bed or towel. 3. Let the weight of your hand stretch your elbow (passive extension). Keep your arm and chest muscles relaxed. You should feel a stretch on the inside of your elbow. 4. If told by your health care provider, you may increase the intensity of your stretch by adding a small wrist weight or hand weight. 5. Hold this position for __________ seconds. 6. Slowly release the stretch. Repeat __________ times. Complete this exercise __________ times a day. Strengthening exercises These exercises build strength and endurance in your elbow and forearm. Endurance is the ability to use your muscles for a long time, even  after they get tired. Elbow flexion, isometric  1. Stand or sit up straight. 2. Bend your left / right elbow in a 90-degree angle (right angle), and keep your forearm at the height of your waist. Your thumb should be pointed toward the ceiling (neutral forearm). 3. Place your other hand on top of your left / right forearm. Gently push down while you resist with your left / right arm (isometric flexion). Push as hard as you can with both arms without causing any pain or movement at your left / right elbow. 4. Hold this position for __________ seconds. 5. Slowly release the tension in both arms. Let your muscles relax completely before you repeat the exercise. Repeat __________ times. Complete this exercise __________ times a day. Elbow extension, isometric  1. Stand or sit up straight. 2. Place your left / right arm so your palm faces your abdomen and is at the height of your waist. 3. Place your other hand on the underside of your left / right forearm. Gently push up while you resist with your left / right arm (isometric extension). Push as hard as you can with both arms without causing any pain or movement at your left / right elbow. 4. Hold this position for __________ seconds. 5. Slowly release the tension in both arms. Let your muscles relax completely before you repeat the exercise. Repeat __________ times. Complete this exercise __________ times a day. Elbow flexion with forearm palm up  1. Sit on a firm chair without armrests, or stand up. 2. Place your left / right arm at your side with your elbow straight and your palm facing forward. 3. Holding a __________weight or gripping a rubber exercise band or tubing, bend your elbow to bring your hand toward your shoulder (flexion). 4. Hold this position for __________ seconds. 5. Slowly return to the starting position. Repeat __________ times. Complete this exercise __________ times a day. Elbow extension, active  1. Sit on a firm chair  without armrests, or stand up. 2. Hold a rubber exercise band or tubing in both hands. 3. Keeping your upper arms at your sides, bring both hands up to your left / right shoulder. Keep your left / right hand just below your other hand. 4. Straighten your left / right elbow (extension) while keeping your other arm still. 5. Hold this position for __________ seconds. 6. Control the resistance of the band or tubing as you return  to the starting position. Repeat __________ times. Complete this exercise __________ times a day. Forearm rotation, supination  1. Sit with your left / right forearm supported on a table. Your elbow should be at waist height and bent at a 90-degree angle (right angle). 2. Gently grasp a lightweight hammer. 3. Rest your hand over the edge of the table with your palm facing down. 4. Without moving your left / right elbow, slowly rotate your forearm to turn your palm up toward the ceiling (supination). 5. Hold this position for __________ seconds. 6. Slowly return to the starting position. Repeat __________ times. Complete this exercise __________ times a day. Forearm rotation, pronation  1. Sit with your left / right forearm supported on a table. Keep your elbow below shoulder height. 2. Gently grasp a lightweight hammer. 3. Rest your hand over the edge of the table with your palm facing up. 4. Without moving your left / right elbow, slowly rotate your forearm to turn your palm down toward the floor (pronation). 5. Hold this position for __________seconds. 6. Slowly return to the starting position. Repeat __________ times. Complete this exercise __________ times a day. This information is not intended to replace advice given to you by your health care provider. Make sure you discuss any questions you have with your health care provider. Document Revised: 02/04/2019 Document Reviewed: 11/04/2018 Elsevier Patient Education  2020 [employer].

## 2020-02-04 NOTE — Progress Notes (Signed)
BP 111/69 (BP Location: Left Arm, Patient Position: Sitting, Cuff Size: Normal)   Pulse 83   Temp 97.9 F (36.6 C) (Oral)   Ht 5' 3"  (1.6 m)   Wt 183 lb 9.6 oz (83.3 kg)   SpO2 95%   BMI 32.52 kg/m    Subjective:    Patient ID: Karla Huber, female    DOB: 10-Jul-1986, 34 y.o.   MRN: [id number]  HPI: Karla Huber is a 34 y.o. female  Chief Complaint  Patient presents with  . Neck Pain   [name] presents today for evaluation and possible treatment with OMT for her neck pain. She notes that she has been doing OK since her last appointment. She feels like her neck is getting a bit better. She notes that she has had a headache in the past couple of days, back of her head has been hurting. No visual changes. Nothing seems to be making it better or worse. No other symptoms. She notes that she has been having pain in her R arm from her wrist into her elbow and into her arm pit, it's the side of her arm, not in the center. Off and on. More aching. Last 2 fingers will go numb. Worse with sleeping on it. Nothing makes it better. Almost makes her feel like it's swollen. She has otherwise been feeling well. No other concerns or complaints at this time.   Relevant past medical, surgical, family and social history reviewed and updated as indicated. Interim medical history since our last visit reviewed. Allergies and medications reviewed and updated.  Review of Systems  Constitutional: Negative.   Respiratory: Negative.   Cardiovascular: Negative.   Gastrointestinal: Negative.   Musculoskeletal: Positive for myalgias, neck pain and neck stiffness. Negative for arthralgias, back pain, gait problem and joint swelling.  Skin: Negative.   Neurological: Positive for headaches. Negative for dizziness, tremors, seizures, syncope, facial asymmetry, speech difficulty, weakness, light-headedness and numbness.  Psychiatric/Behavioral: Negative.     Per HPI unless specifically indicated above       Objective:    BP 111/69 (BP Location: Left Arm, Patient Position: Sitting, Cuff Size: Normal)   Pulse 83   Temp 97.9 F (36.6 C) (Oral)   Ht 5' 3"  (1.6 m)   Wt 183 lb 9.6 oz (83.3 kg)   SpO2 95%   BMI 32.52 kg/m   Wt Readings from Last 3 Encounters:  02/04/20 183 lb 9.6 oz (83.3 kg)  11/23/19 187 lb (84.8 kg)  11/12/19 186 lb 9.6 oz (84.6 kg)    Physical Exam Vitals and nursing note reviewed.  Constitutional:      General: She is not in acute distress.    Appearance: Normal appearance. She is not ill-appearing.  HENT:     Head: Normocephalic and atraumatic.     Right Ear: External ear normal.     Left Ear: External ear normal.     Nose: Nose normal.     Mouth/Throat:     Mouth: Mucous membranes are moist.     Pharynx: Oropharynx is clear.  Eyes:     Extraocular Movements: Extraocular movements intact.     Conjunctiva/sclera: Conjunctivae normal.     Pupils: Pupils are equal, round, and reactive to light.  Neck:     Vascular: No carotid bruit.  Cardiovascular:     Rate and Rhythm: Normal rate.     Pulses: Normal pulses.  Pulmonary:     Effort: Pulmonary effort is normal. No respiratory distress.  Abdominal:     General: Abdomen is flat. There is no distension.     Palpations: Abdomen is soft. There is no mass.     Tenderness: There is no abdominal tenderness. There is no right CVA tenderness, left CVA tenderness, guarding or rebound.     Hernia: No hernia is present.  Musculoskeletal:     Cervical back: No muscular tenderness.  Lymphadenopathy:     Cervical: No cervical adenopathy.  Skin:    General: Skin is warm and dry.     Capillary Refill: Capillary refill takes less than 2 seconds.     Coloration: Skin is not jaundiced or pale.     Findings: No bruising, erythema, lesion or rash.  Neurological:     General: No focal deficit present.     Mental Status: She is alert. Mental status is at baseline.  Psychiatric:        Mood and Affect: Mood normal.         Behavior: Behavior normal.        Thought Content: Thought content normal.        Judgment: Judgment normal.   Musculoskeletal:  Exam found Decreased ROM, Tissue texture changes, Tenderness to palpation and Asymmetry of patient's  head, neck, thorax, ribs, lumbar, pelvis, sacrum and upper extremity Osteopathic Structural Exam:   Head: OAESRR, hypertonic suboccipital muscles, R torsion  Neck: C3ESRR, C4ESRL  Thorax: T4-6SRRL  Ribs: Ribs 5-7 locked up on the L  Lumbar: L3-5SLRR  Pelvis: Anterior R innominate  Sacrum: L on L torsion  Upper Extremity: fascial strain from R elbow into her wrist    Results for orders placed or performed in visit on 11/12/19  CBC with Differential OUT  Result Value Ref Range   WBC 8.6 3.4 - 10.8 x10E3/uL   RBC 4.17 3.77 - 5.28 x10E6/uL   Hemoglobin 13.9 11.1 - 15.9 g/dL   Hematocrit 40.9 34.0 - 46.6 %   MCV 98 (H) 79 - 97 fL   MCH 33.3 (H) 26.6 - 33.0 pg   MCHC 34.0 31.5 - 35.7 g/dL   RDW 11.6 (L) 11.7 - 15.4 %   Platelets 328 150 - 450 x10E3/uL   Neutrophils 69 Not Estab. %   Lymphs 22 Not Estab. %   Monocytes 5 Not Estab. %   Eos 3 Not Estab. %   Basos 1 Not Estab. %   Neutrophils Absolute 5.9 1.4 - 7.0 x10E3/uL   Lymphocytes Absolute 1.9 0.7 - 3.1 x10E3/uL   Monocytes Absolute 0.5 0.1 - 0.9 x10E3/uL   EOS (ABSOLUTE) 0.2 0.0 - 0.4 x10E3/uL   Basophils Absolute 0.0 0.0 - 0.2 x10E3/uL   Immature Granulocytes 0 Not Estab. %   Immature Grans (Abs) 0.0 0.0 - 0.1 x10E3/uL  Comp Met (CMET)  Result Value Ref Range   Glucose 95 65 - 99 mg/dL   BUN 7 6 - 20 mg/dL   Creatinine, Ser 0.69 0.57 - 1.00 mg/dL   GFR calc non Af Amer 114 >59 mL/min/1.73   GFR calc Af Amer 131 >59 mL/min/1.73   BUN/Creatinine Ratio 10 9 - 23   Sodium 139 134 - 144 mmol/L   Potassium 4.3 3.5 - 5.2 mmol/L   Chloride 103 96 - 106 mmol/L   CO2 23 20 - 29 mmol/L   Calcium 9.7 8.7 - 10.2 mg/dL   Total Protein 7.3 6.0 - 8.5 g/dL   Albumin 4.5 3.8 - 4.8 g/dL   Globulin, Total  2.8 1.5 - 4.5  g/dL   Albumin/Globulin Ratio 1.6 1.2 - 2.2   Bilirubin Total 0.3 0.0 - 1.2 mg/dL   Alkaline Phosphatase 67 39 - 117 IU/L   AST 17 0 - 40 IU/L   ALT 29 0 - 32 IU/L  Lipid Panel w/o Chol/HDL Ratio OUT  Result Value Ref Range   Cholesterol, Total 192 100 - 199 mg/dL   Triglycerides 177 (H) 0 - 149 mg/dL   HDL 66 >39 mg/dL   VLDL Cholesterol Cal 30 5 - 40 mg/dL   LDL Chol Calc (NIH) 96 0 - 99 mg/dL  TSH  Result Value Ref Range   TSH 1.530 0.450 - 4.500 uIU/mL  UA/M w/rflx Culture, Routine   Specimen: Urine   URINE  Result Value Ref Range   Specific Gravity, UA <1.005 (L) 1.005 - 1.030   pH, UA 5.5 5.0 - 7.5   Color, UA Yellow Yellow   Appearance Ur Clear Clear   Leukocytes,UA Negative Negative   Protein,UA Negative Negative/Trace   Glucose, UA Negative Negative   Ketones, UA Negative Negative   RBC, UA Negative Negative   Bilirubin, UA Negative Negative   Urobilinogen, Ur 0.2 0.2 - 1.0 mg/dL   Nitrite, UA Negative Negative      Assessment & Plan:   Problem List Items Addressed This Visit    None    Visit Diagnoses    Neck pain    -  Primary   Improving. She does have somatic dysfunction with is contributing to her symptoms. Treated today with good results as below.    Right elbow pain       Concern for cubital tunnel. Will start stretches. Call with any concerns.    Head region somatic dysfunction       Thoracic segment dysfunction       Cervical (neck) region somatic dysfunction       Somatic dysfunction of rib region       Somatic dysfunction of sacral region       Somatic dysfunction of pelvis region       Somatic dysfunction of lumbar region       Somatic dysfunction of upper extremities         After verbal consent was obtained, patient was treated today with osteopathic manipulative medicine to the regions of the head, neck, thorax, ribs, lumbar, pelvis, sacrum and upper extremity using the techniques of cranial, myofascial release, counterstrain,  muscle energy, HVLA and soft tissue. Areas of compensation relating to her primary pain source also treated. Patient tolerated the procedure well with good objective and good subjective improvement in symptoms. .She left the room in good condition. She was advised to stay well hydrated and that she may have some soreness following the procedure. If not improving or worsening, she will call and come in. She will return for reevaluation  in 1-2 months.   Follow up plan: Return in about 4 weeks (around 03/03/2020).

## 2020-02-21 ENCOUNTER — Telehealth: Payer: BC Managed Care – PPO | Admitting: Physician Assistant

## 2020-02-21 DIAGNOSIS — J329 Chronic sinusitis, unspecified: Secondary | ICD-10-CM

## 2020-02-21 MED ORDER — DOXYCYCLINE HYCLATE 100 MG PO CAPS: 100.0000 mg | ORAL_CAPSULE | Freq: Two times a day (BID) | ORAL | 0 refills | Status: AC

## 2020-02-21 NOTE — Progress Notes (Signed)

## 2020-03-03 DIAGNOSIS — M9909 Segmental and somatic dysfunction of abdomen and other regions: Secondary | ICD-10-CM | POA: Diagnosis not present

## 2020-03-03 DIAGNOSIS — M25559 Pain in unspecified hip: Secondary | ICD-10-CM | POA: Diagnosis not present

## 2020-03-03 DIAGNOSIS — M9904 Segmental and somatic dysfunction of sacral region: Secondary | ICD-10-CM | POA: Diagnosis not present

## 2020-03-03 DIAGNOSIS — M9905 Segmental and somatic dysfunction of pelvic region: Secondary | ICD-10-CM | POA: Diagnosis not present

## 2020-03-03 DIAGNOSIS — M9903 Segmental and somatic dysfunction of lumbar region: Secondary | ICD-10-CM | POA: Diagnosis not present

## 2020-03-03 DIAGNOSIS — M9906 Segmental and somatic dysfunction of lower extremity: Secondary | ICD-10-CM | POA: Diagnosis not present

## 2020-03-04 ENCOUNTER — Other Ambulatory Visit: Payer: Self-pay | Admitting: Family Medicine

## 2020-03-04 NOTE — Telephone Encounter (Signed)
Requested medication (s) are due for refill today: yes  Requested medication (s) are on the active medication list: no  Last refill:  08/22/1989  Future visit scheduled: no  Notes to clinic:  dc'd 11/12/19   Requested Prescriptions  Pending Prescriptions Disp Refills   FLOVENT HFA 110 MCG/ACT inhaler [Pharmacy Med Name: FLOVENT HFA 110 MCG INHALER] 12 Inhaler 3    Sig: TAKE 2 PUFFS BY MOUTH TWICE A DAY      Pulmonology:  Corticosteroids Passed - 03/04/2020 11:33 AM      Passed - Valid encounter within last 12 months    Recent Outpatient Visits           4 weeks ago Neck pain   [facility] [place], [place], DO   2 months ago Neck pain   [facility] [place], [place], DO   2 months ago Neck pain   [facility] [place], [place], DO   2 months ago Neck pain   [facility] [place], [place], DO   3 months ago Neck pain   [facility] [place], [place], DO

## 2020-03-10 ENCOUNTER — Other Ambulatory Visit: Payer: Self-pay

## 2020-03-10 ENCOUNTER — Ambulatory Visit (INDEPENDENT_AMBULATORY_CARE_PROVIDER_SITE_OTHER): Payer: BC Managed Care – PPO | Admitting: Family Medicine

## 2020-03-10 ENCOUNTER — Encounter: Payer: Self-pay | Admitting: Family Medicine

## 2020-03-10 VITALS — BP 118/78 | HR 91 | Temp 98.5°F | Ht 62.6 in | Wt 181.2 lb

## 2020-03-10 DIAGNOSIS — M9905 Segmental and somatic dysfunction of pelvic region: Secondary | ICD-10-CM

## 2020-03-10 DIAGNOSIS — M9901 Segmental and somatic dysfunction of cervical region: Secondary | ICD-10-CM | POA: Diagnosis not present

## 2020-03-10 DIAGNOSIS — M9909 Segmental and somatic dysfunction of abdomen and other regions: Secondary | ICD-10-CM

## 2020-03-10 DIAGNOSIS — M791 Myalgia, unspecified site: Secondary | ICD-10-CM

## 2020-03-10 DIAGNOSIS — M9906 Segmental and somatic dysfunction of lower extremity: Secondary | ICD-10-CM

## 2020-03-10 DIAGNOSIS — M9902 Segmental and somatic dysfunction of thoracic region: Secondary | ICD-10-CM

## 2020-03-10 DIAGNOSIS — M9900 Segmental and somatic dysfunction of head region: Secondary | ICD-10-CM | POA: Diagnosis not present

## 2020-03-10 DIAGNOSIS — M9908 Segmental and somatic dysfunction of rib cage: Secondary | ICD-10-CM

## 2020-03-10 DIAGNOSIS — M9903 Segmental and somatic dysfunction of lumbar region: Secondary | ICD-10-CM

## 2020-03-10 DIAGNOSIS — M9904 Segmental and somatic dysfunction of sacral region: Secondary | ICD-10-CM

## 2020-03-10 NOTE — Progress Notes (Signed)
BP 118/78 (BP Location: Left Arm, Patient Position: Sitting, Cuff Size: Normal)   Pulse 91   Temp 98.5 F (36.9 C) (Oral)   Ht 5' 2.6" (1.59 m)   Wt 181 lb 3.2 oz (82.2 kg)   SpO2 99%   BMI 32.51 kg/m    Subjective:    Patient ID: Karla Huber, female    DOB: 1986-07-07, 34 y.o.   MRN: [id number]  HPI: Karla Huber is a 34 y.o. female  Chief Complaint  Patient presents with  . Neck Pain   Karla Huber presents today for evaluation and possible treatment with OMT. She notes that she has been aching. Hurting on the tops of her feet and along her ribs on the R side. Kids have had cold several weeks ago and she had a cold and sinus issues. She was on antibiotics. Since then, she has been aching and sore. Nothing really makes it better or worse. Unsure if it radiates. She is otherwise feeling OK with no other concerns or complaints at this time.   Relevant past medical, surgical, family and social history reviewed and updated as indicated. Interim medical history since our last visit reviewed. Allergies and medications reviewed and updated.  Review of Systems  Constitutional: Negative.   HENT: Positive for congestion, postnasal drip and rhinorrhea. Negative for dental problem, drooling, ear discharge, ear pain, facial swelling, hearing loss, mouth sores, nosebleeds, sinus pressure, sinus pain, sneezing, sore throat, tinnitus, trouble swallowing and voice change.   Respiratory: Positive for cough. Negative for apnea, choking, chest tightness, shortness of breath, wheezing and stridor.   Cardiovascular: Negative.   Gastrointestinal: Negative.   Musculoskeletal: Positive for myalgias. Negative for arthralgias, back pain, gait problem, joint swelling, neck pain and neck stiffness.  Skin: Negative.   Neurological: Negative.   Psychiatric/Behavioral: Negative.     Per HPI unless specifically indicated above     Objective:    BP 118/78 (BP Location: Left Arm, Patient Position:  Sitting, Cuff Size: Normal)   Pulse 91   Temp 98.5 F (36.9 C) (Oral)   Ht 5' 2.6" (1.59 m)   Wt 181 lb 3.2 oz (82.2 kg)   SpO2 99%   BMI 32.51 kg/m   Wt Readings from Last 3 Encounters:  03/10/20 181 lb 3.2 oz (82.2 kg)  02/04/20 183 lb 9.6 oz (83.3 kg)  11/23/19 187 lb (84.8 kg)    Physical Exam Vitals and nursing note reviewed.  Constitutional:      General: She is not in acute distress.    Appearance: Normal appearance. She is not ill-appearing.  HENT:     Head: Normocephalic and atraumatic.     Right Ear: External ear normal.     Left Ear: External ear normal.     Nose: Nose normal.     Mouth/Throat:     Mouth: Mucous membranes are moist.     Pharynx: Oropharynx is clear.  Eyes:     Extraocular Movements: Extraocular movements intact.     Conjunctiva/sclera: Conjunctivae normal.     Pupils: Pupils are equal, round, and reactive to light.  Neck:     Vascular: No carotid bruit.  Cardiovascular:     Rate and Rhythm: Normal rate.     Pulses: Normal pulses.  Pulmonary:     Effort: Pulmonary effort is normal. No respiratory distress.  Abdominal:     General: Abdomen is flat. There is no distension.     Palpations: Abdomen is soft. There is no mass.  Tenderness: There is no abdominal tenderness. There is no right CVA tenderness, left CVA tenderness, guarding or rebound.     Hernia: No hernia is present.  Musculoskeletal:     Cervical back: No muscular tenderness.  Lymphadenopathy:     Cervical: No cervical adenopathy.  Skin:    General: Skin is warm and dry.     Capillary Refill: Capillary refill takes less than 2 seconds.     Coloration: Skin is not jaundiced or pale.     Findings: No bruising, erythema, lesion or rash.  Neurological:     General: No focal deficit present.     Mental Status: She is alert. Mental status is at baseline.  Psychiatric:        Mood and Affect: Mood normal.        Behavior: Behavior normal.        Thought Content: Thought content  normal.        Judgment: Judgment normal.   Musculoskeletal:  Exam found Decreased ROM, Tissue texture changes, Tenderness to palpation and Asymmetry of patient's  head, neck, thorax, ribs, lumbar, pelvis, sacrum, lower extremity and abdomen Osteopathic Structural Exam:   Head: OA ESSR, hypertonic suboccipital muscles   Neck: C3ESRR, C4ESRL, trap hypertonic on the R  Thorax: T3-5SRRL  Ribs: Ribs 3-11 locked down on the R  Lumbar: QL hypertonic on the R  Pelvis: Posterior innominate on the R  Sacrum: R on R torsion  Lower Extremity: posterior fibular head bilaterally, fascial strain from her feet up to her knees  Abdomen: diaphragm hypertonic bilaterally R>L   Results for orders placed or performed in visit on 11/12/19  CBC with Differential OUT  Result Value Ref Range   WBC 8.6 3.4 - 10.8 x10E3/uL   RBC 4.17 3.77 - 5.28 x10E6/uL   Hemoglobin 13.9 11.1 - 15.9 g/dL   Hematocrit 40.9 34.0 - 46.6 %   MCV 98 (H) 79 - 97 fL   MCH 33.3 (H) 26.6 - 33.0 pg   MCHC 34.0 31.5 - 35.7 g/dL   RDW 11.6 (L) 11.7 - 15.4 %   Platelets 328 150 - 450 x10E3/uL   Neutrophils 69 Not Estab. %   Lymphs 22 Not Estab. %   Monocytes 5 Not Estab. %   Eos 3 Not Estab. %   Basos 1 Not Estab. %   Neutrophils Absolute 5.9 1.4 - 7.0 x10E3/uL   Lymphocytes Absolute 1.9 0.7 - 3.1 x10E3/uL   Monocytes Absolute 0.5 0.1 - 0.9 x10E3/uL   EOS (ABSOLUTE) 0.2 0.0 - 0.4 x10E3/uL   Basophils Absolute 0.0 0.0 - 0.2 x10E3/uL   Immature Granulocytes 0 Not Estab. %   Immature Grans (Abs) 0.0 0.0 - 0.1 x10E3/uL  Comp Met (CMET)  Result Value Ref Range   Glucose 95 65 - 99 mg/dL   BUN 7 6 - 20 mg/dL   Creatinine, Ser 0.69 0.57 - 1.00 mg/dL   GFR calc non Af Amer 114 >59 mL/min/1.73   GFR calc Af Amer 131 >59 mL/min/1.73   BUN/Creatinine Ratio 10 9 - 23   Sodium 139 134 - 144 mmol/L   Potassium 4.3 3.5 - 5.2 mmol/L   Chloride 103 96 - 106 mmol/L   CO2 23 20 - 29 mmol/L   Calcium 9.7 8.7 - 10.2 mg/dL   Total Protein  7.3 6.0 - 8.5 g/dL   Albumin 4.5 3.8 - 4.8 g/dL   Globulin, Total 2.8 1.5 - 4.5 g/dL   Albumin/Globulin Ratio 1.6 1.2 -  2.2   Bilirubin Total 0.3 0.0 - 1.2 mg/dL   Alkaline Phosphatase 67 39 - 117 IU/L   AST 17 0 - 40 IU/L   ALT 29 0 - 32 IU/L  Lipid Panel w/o Chol/HDL Ratio OUT  Result Value Ref Range   Cholesterol, Total 192 100 - 199 mg/dL   Triglycerides 177 (H) 0 - 149 mg/dL   HDL 66 >39 mg/dL   VLDL Cholesterol Cal 30 5 - 40 mg/dL   LDL Chol Calc (NIH) 96 0 - 99 mg/dL  TSH  Result Value Ref Range   TSH 1.530 0.450 - 4.500 uIU/mL  UA/M w/rflx Culture, Routine   Specimen: Urine   URINE  Result Value Ref Range   Specific Gravity, UA <1.005 (L) 1.005 - 1.030   pH, UA 5.5 5.0 - 7.5   Color, UA Yellow Yellow   Appearance Ur Clear Clear   Leukocytes,UA Negative Negative   Protein,UA Negative Negative/Trace   Glucose, UA Negative Negative   Ketones, UA Negative Negative   RBC, UA Negative Negative   Bilirubin, UA Negative Negative   Urobilinogen, Ur 0.2 0.2 - 1.0 mg/dL   Nitrite, UA Negative Negative      Assessment & Plan:   Problem List Items Addressed This Visit    None    Visit Diagnoses    Myalgia    -  Primary   Likely due to URI. She does have somatic dysfuntion that is contributing to her symptoms. Treated today with good results as below. Call with any concerns.    Head region somatic dysfunction       Thoracic segment dysfunction       Cervical (neck) region somatic dysfunction       Somatic dysfunction of rib region       Somatic dysfunction of sacral region       Somatic dysfunction of pelvis region       Somatic dysfunction of lumbar region       Segmental dysfunction of abdomen       Somatic dysfunction of lower extremities        After verbal consent was obtained, patient was treated today with osteopathic manipulative medicine to the regions of the head, neck, thorax, ribs, lumbar, pelvis, sacrum, abdomen and lower extremity using the techniques of  cranial, myofascial release, counterstrain, muscle energy, HVLA and soft tissue. Areas of compensation relating to her primary pain source also treated. Patient tolerated the procedure well with good objective and good subjective improvement in symptoms. She left the room in good condition. She was advised to stay well hydrated and that she may have some soreness following the procedure. If not improving or worsening, she will call and come in. She will return for reevaluation  in 1-2 months.    Follow up plan: Return 4-6 weeks as needed.

## 2020-03-12 ENCOUNTER — Encounter: Payer: Self-pay | Admitting: Family Medicine

## 2020-04-11 ENCOUNTER — Other Ambulatory Visit: Payer: Self-pay

## 2020-04-11 ENCOUNTER — Encounter: Payer: Self-pay | Admitting: Family Medicine

## 2020-04-11 ENCOUNTER — Ambulatory Visit (INDEPENDENT_AMBULATORY_CARE_PROVIDER_SITE_OTHER): Payer: BC Managed Care – PPO | Admitting: Family Medicine

## 2020-04-11 VITALS — BP 134/83 | HR 109 | Temp 98.7°F | Wt 180.0 lb

## 2020-04-11 DIAGNOSIS — R197 Diarrhea, unspecified: Secondary | ICD-10-CM | POA: Diagnosis not present

## 2020-04-11 DIAGNOSIS — R1011 Right upper quadrant pain: Secondary | ICD-10-CM | POA: Diagnosis not present

## 2020-04-11 NOTE — Progress Notes (Signed)
BP 134/83   Pulse (!) 109   Temp 98.7 F (37.1 C)   Wt 180 lb (81.6 kg)   SpO2 98%   BMI 32.30 kg/m    Subjective:    Patient ID: Karla Huber, female    DOB: [birth year], 34 y.o.   MRN: [id number]  HPI: Karla Huber is a 34 y.o. female  Chief Complaint  Patient presents with  . Abdominal Pain    X 2 months, bright yellow diarrhea    ABDOMINAL PAIN- was having diarrhea with drinking coffee for about 2 months. Has changed her coffee regimen, stopped the coffee, which helped a little bit, but hasn't gone away. Now happening rarely.   Duration:2 months Onset: unsure- it's fluctuating Severity: severe Quality: strong dull pressure Location:  RUQ  Episode duration: couple of hours Radiation: into her back Frequency: intermittent Alleviating factors: heating pad, ibuprofen Aggravating factors: eating Status: worse Treatments attempted: ibuprofen Fever: no Nausea: yes Vomiting: no Weight loss: no Decreased appetite: yes Diarrhea: yes Constipation: no Blood in stool: no Heartburn: no Jaundice: no Rash: no Dysuria/urinary frequency: no Hematuria: no History of sexually transmitted disease: no Recurrent NSAID use: no  Relevant past medical, surgical, family and social history reviewed and updated as indicated. Interim medical history since our last visit reviewed. Allergies and medications reviewed and updated.  Review of Systems  Respiratory: Negative.   Cardiovascular: Negative.   Gastrointestinal: Positive for abdominal pain and diarrhea. Negative for abdominal distention, anal bleeding, blood in stool, constipation, nausea, rectal pain and vomiting.  Musculoskeletal: Negative.   Psychiatric/Behavioral: Negative.     Per HPI unless specifically indicated above     Objective:    BP 134/83   Pulse (!) 109   Temp 98.7 F (37.1 C)   Wt 180 lb (81.6 kg)   SpO2 98%   BMI 32.30 kg/m   Wt Readings from Last 3 Encounters:  04/11/20 180 lb (81.6 kg)    03/10/20 181 lb 3.2 oz (82.2 kg)  02/04/20 183 lb 9.6 oz (83.3 kg)    Physical Exam Vitals and nursing note reviewed.  Constitutional:      General: She is not in acute distress.    Appearance: Normal appearance. She is not ill-appearing, toxic-appearing or diaphoretic.  HENT:     Head: Normocephalic and atraumatic.     Right Ear: External ear normal.     Left Ear: External ear normal.     Nose: Nose normal.     Mouth/Throat:     Mouth: Mucous membranes are moist.     Pharynx: Oropharynx is clear.  Eyes:     General: No scleral icterus.       Right eye: No discharge.        Left eye: No discharge.     Extraocular Movements: Extraocular movements intact.     Conjunctiva/sclera: Conjunctivae normal.     Pupils: Pupils are equal, round, and reactive to light.  Cardiovascular:     Rate and Rhythm: Normal rate and regular rhythm.     Pulses: Normal pulses.     Heart sounds: Normal heart sounds. No murmur heard.  No friction rub. No gallop.   Pulmonary:     Effort: Pulmonary effort is normal. No respiratory distress.     Breath sounds: Normal breath sounds. No stridor. No wheezing, rhonchi or rales.  Chest:     Chest wall: No tenderness.  Abdominal:     General: Abdomen is flat. Bowel sounds are normal. There  is no distension or abdominal bruit. There are no signs of injury.     Palpations: Abdomen is soft. There is no shifting dullness, fluid wave, hepatomegaly, splenomegaly, mass or pulsatile mass.     Tenderness: There is abdominal tenderness in the right upper quadrant. There is no right CVA tenderness, left CVA tenderness, guarding or rebound. Positive signs include Murphy's sign. Negative signs include Rovsing's sign, McBurney's sign, psoas sign and obturator sign.  Musculoskeletal:        General: Normal range of motion.     Cervical back: Normal range of motion and neck supple.  Skin:    General: Skin is warm and dry.     Capillary Refill: Capillary refill takes less  than 2 seconds.     Coloration: Skin is not jaundiced or pale.     Findings: No bruising, erythema, lesion or rash.  Neurological:     General: No focal deficit present.     Mental Status: She is alert and oriented to person, place, and time. Mental status is at baseline.  Psychiatric:        Mood and Affect: Mood normal.        Behavior: Behavior normal.        Thought Content: Thought content normal.        Judgment: Judgment normal.     Results for orders placed or performed in visit on 11/12/19  CBC with Differential OUT  Result Value Ref Range   WBC 8.6 3.4 - 10.8 x10E3/uL   RBC 4.17 3.77 - 5.28 x10E6/uL   Hemoglobin 13.9 11.1 - 15.9 g/dL   Hematocrit 40.9 34.0 - 46.6 %   MCV 98 (H) 79 - 97 fL   MCH 33.3 (H) 26.6 - 33.0 pg   MCHC 34.0 31 - 35 g/dL   RDW 11.6 (L) 11.7 - 15.4 %   Platelets 328 150 - 450 x10E3/uL   Neutrophils 69 Not Estab. %   Lymphs 22 Not Estab. %   Monocytes 5 Not Estab. %   Eos 3 Not Estab. %   Basos 1 Not Estab. %   Neutrophils Absolute 5.9 1 - 7 x10E3/uL   Lymphocytes Absolute 1.9 0 - 3 x10E3/uL   Monocytes Absolute 0.5 0 - 0 x10E3/uL   EOS (ABSOLUTE) 0.2 0.0 - 0.4 x10E3/uL   Basophils Absolute 0.0 0 - 0 x10E3/uL   Immature Granulocytes 0 Not Estab. %   Immature Grans (Abs) 0.0 0.0 - 0.1 x10E3/uL  Comp Met (CMET)  Result Value Ref Range   Glucose 95 65 - 99 mg/dL   BUN 7 6 - 20 mg/dL   Creatinine, Ser 0.69 0.57 - 1.00 mg/dL   GFR calc non Af Amer 114 >59 mL/min/1.73   GFR calc Af Amer 131 >59 mL/min/1.73   BUN/Creatinine Ratio 10 9 - 23   Sodium 139 134 - 144 mmol/L   Potassium 4.3 3.5 - 5.2 mmol/L   Chloride 103 96 - 106 mmol/L   CO2 23 20 - 29 mmol/L   Calcium 9.7 8.7 - 10.2 mg/dL   Total Protein 7.3 6.0 - 8.5 g/dL   Albumin 4.5 3.8 - 4.8 g/dL   Globulin, Total 2.8 1.5 - 4.5 g/dL   Albumin/Globulin Ratio 1.6 1.2 - 2.2   Bilirubin Total 0.3 0.0 - 1.2 mg/dL   Alkaline Phosphatase 67 39 - 117 IU/L   AST 17 0 - 40 IU/L   ALT 29 0 - 32  IU/L  Lipid Panel w/o Chol/HDL  Ratio OUT  Result Value Ref Range   Cholesterol, Total 192 100 - 199 mg/dL   Triglycerides 177 (H) 0 - 149 mg/dL   HDL 66 >39 mg/dL   VLDL Cholesterol Cal 30 5 - 40 mg/dL   LDL Chol Calc (NIH) 96 0 - 99 mg/dL  TSH  Result Value Ref Range   TSH 1.530 0.450 - 4.500 uIU/mL  UA/M w/rflx Culture, Routine   Specimen: Urine   URINE  Result Value Ref Range   Specific Gravity, UA <1.005 (L) 1.005 - 1.030   pH, UA 5.5 5.0 - 7.5   Color, UA Yellow Yellow   Appearance Ur Clear Clear   Leukocytes,UA Negative Negative   Protein,UA Negative Negative/Trace   Glucose, UA Negative Negative   Ketones, UA Negative Negative   RBC, UA Negative Negative   Bilirubin, UA Negative Negative   Urobilinogen, Ur 0.2 0.2 - 1.0 mg/dL   Nitrite, UA Negative Negative      Assessment & Plan:   Problem List Items Addressed This Visit    None    Visit Diagnoses    RUQ pain    -  Primary   Concern for gall stones. Will check labs and obtain a RUQ [place]. Await results. Call with any concerns.    Relevant Orders   US Abdomen Limited RUQ   H. pylori antibody, IgA([facility])   Diarrhea, unspecified type       If not doing better, will check stool studies. Await results. Call with any concerns.    Relevant Orders   Comprehensive metabolic panel   CBC with Differential/Platelet   Ova and parasite examination   Fecal leukocytes   Stool C-Diff Toxin Assay   Stool Culture   Fecal occult blood, imunochemical       Follow up plan: Return if symptoms worsen or fail to improve.

## 2020-04-12 ENCOUNTER — Encounter: Payer: Self-pay | Admitting: Family Medicine

## 2020-04-12 ENCOUNTER — Other Ambulatory Visit: Payer: Self-pay

## 2020-04-12 ENCOUNTER — Emergency Department (HOSPITAL_BASED_OUTPATIENT_CLINIC_OR_DEPARTMENT_OTHER)
Admission: EM | Admit: 2020-04-12 | Discharge: 2020-04-12 | Disposition: A | Discharge status: Home / Self Care | Payer: BC Managed Care – PPO | Attending: Emergency Medicine | Admitting: Emergency Medicine

## 2020-04-12 ENCOUNTER — Emergency Department (HOSPITAL_BASED_OUTPATIENT_CLINIC_OR_DEPARTMENT_OTHER): Payer: BC Managed Care – PPO

## 2020-04-12 ENCOUNTER — Encounter (HOSPITAL_BASED_OUTPATIENT_CLINIC_OR_DEPARTMENT_OTHER): Payer: Self-pay | Admitting: *Deleted

## 2020-04-12 DIAGNOSIS — K8020 Calculus of gallbladder without cholecystitis without obstruction: Secondary | ICD-10-CM

## 2020-04-12 DIAGNOSIS — Z20822 Contact with and (suspected) exposure to covid-19: Secondary | ICD-10-CM | POA: Diagnosis not present

## 2020-04-12 DIAGNOSIS — J45909 Unspecified asthma, uncomplicated: Secondary | ICD-10-CM | POA: Diagnosis not present

## 2020-04-12 DIAGNOSIS — I10 Essential (primary) hypertension: Secondary | ICD-10-CM | POA: Insufficient documentation

## 2020-04-12 DIAGNOSIS — R197 Diarrhea, unspecified: Secondary | ICD-10-CM | POA: Diagnosis not present

## 2020-04-12 DIAGNOSIS — R1011 Right upper quadrant pain: Secondary | ICD-10-CM | POA: Diagnosis not present

## 2020-04-12 LAB — URINALYSIS, ROUTINE W REFLEX MICROSCOPIC
Bilirubin Urine: NEGATIVE
Glucose, UA: NEGATIVE mg/dL
Ketones, ur: NEGATIVE mg/dL
Leukocytes,Ua: NEGATIVE
Nitrite: NEGATIVE
Protein, ur: NEGATIVE mg/dL
Specific Gravity, Urine: <1.005 — ABNORMAL LOW (ref 1.005–1.030)
pH: 5.5 (ref 5.0–8.0)

## 2020-04-12 LAB — COMPREHENSIVE METABOLIC PANEL
ALT: 28 U/L (ref 0–44)
AST: 17 U/L (ref 15–41)
Albumin: 4.5 g/dL (ref 3.5–5.0)
Alkaline Phosphatase: 59 U/L (ref 38–126)
Anion gap: 12 (ref 5–15)
BUN: 7 mg/dL (ref 6–20)
CO2: 22 mmol/L (ref 22–32)
Calcium: 9.6 mg/dL (ref 8.9–10.3)
Chloride: 102 mmol/L (ref 98–111)
Creatinine, Ser: 0.68 mg/dL (ref 0.44–1.00)
GFR calc Af Amer: >60 mL/min (ref >60)
GFR calc non Af Amer: >60 mL/min (ref >60)
Glucose, Bld: 100 mg/dL — ABNORMAL HIGH (ref 70–99)
Potassium: 3.9 mmol/L (ref 3.5–5.1)
Sodium: 136 mmol/L (ref 135–145)
Total Bilirubin: 0.9 mg/dL (ref 0.3–1.2)
Total Protein: 8.1 g/dL (ref 6.5–8.1)

## 2020-04-12 LAB — CBC WITH DIFFERENTIAL/PLATELET
Abs Immature Granulocytes: 0.02 10*3/uL (ref 0.00–0.07)
Basophils Absolute: 0.1 10*3/uL (ref 0.0–0.1)
Basophils Relative: 1 %
Eosinophils Absolute: 0 10*3/uL (ref 0.0–0.5)
Eosinophils Relative: 0 %
HCT: 43.5 % (ref 36.0–46.0)
Hemoglobin: 15.1 g/dL — ABNORMAL HIGH (ref 12.0–15.0)
Immature Granulocytes: 0 %
Lymphocytes Relative: 17 %
Lymphs Abs: 1.7 10*3/uL (ref 0.7–4.0)
MCH: 33 pg (ref 26.0–34.0)
MCHC: 34.7 g/dL (ref 30.0–36.0)
MCV: 95 fL (ref 80.0–100.0)
Monocytes Absolute: 0.6 10*3/uL (ref 0.1–1.0)
Monocytes Relative: 5 %
Neutro Abs: 7.9 10*3/uL — ABNORMAL HIGH (ref 1.7–7.7)
Neutrophils Relative %: 77 %
Platelets: 375 10*3/uL (ref 150–400)
RBC: 4.58 MIL/uL (ref 3.87–5.11)
RDW: 11.7 % (ref 11.5–15.5)
WBC: 10.2 10*3/uL (ref 4.0–10.5)
nRBC: 0 % (ref 0.0–0.2)

## 2020-04-12 LAB — URINALYSIS, MICROSCOPIC (REFLEX)

## 2020-04-12 LAB — PREGNANCY, URINE: Preg Test, Ur: NEGATIVE

## 2020-04-12 LAB — LIPASE, BLOOD: Lipase: 30 U/L (ref 11–51)

## 2020-04-12 LAB — SARS CORONAVIRUS 2 BY RT PCR (HOSPITAL ORDER, PERFORMED IN ~~LOC~~ HOSPITAL LAB): SARS Coronavirus 2: NEGATIVE

## 2020-04-12 MED ORDER — OXYCODONE HCL 5 MG PO CAPS: 5.0000 mg | ORAL_CAPSULE | ORAL | 0 refills | Status: DC | PRN

## 2020-04-12 MED ORDER — FENTANYL CITRATE (PF) 100 MCG/2ML IJ SOLN
50.0000 ug | Freq: Once | INTRAMUSCULAR | Status: AC
  Administered 2020-04-12: 50 ug via INTRAVENOUS
  Filled 2020-04-12: qty 2

## 2020-04-12 MED ORDER — SODIUM CHLORIDE 0.9 % IV BOLUS
1000.0000 mL | Freq: Once | INTRAVENOUS | Status: AC
  Administered 2020-04-12: 1000 mL via INTRAVENOUS

## 2020-04-12 MED ORDER — ONDANSETRON 4 MG PO TBDP
4.0000 mg | ORAL_TABLET | Freq: Three times a day (TID) | ORAL | 0 refills | Status: DC | PRN
Start: 2020-04-12 — End: 2020-11-29

## 2020-04-12 MED ORDER — ONDANSETRON HCL 4 MG/2ML IJ SOLN
2.0000 mg | Freq: Once | INTRAMUSCULAR | Status: AC
  Administered 2020-04-12: 2 mg via INTRAVENOUS
  Filled 2020-04-12: qty 2

## 2020-04-12 NOTE — ED Provider Notes (Signed)
[facility] EMERGENCY DEPARTMENT Provider Note   CSN: [id number] Arrival date & time: 04/12/20  1310     History Chief Complaint  Patient presents with  . Abdominal Pain    Karla Huber is a 34 y.o. female history of iron deficiency anemia, GERD, hypertension, obesity.  Patient presents today for right upper quadrant pain intermittent for the last 7 days occurs anytime she attempts to eat. Described as a sharp right upper quadrant pain nonradiating worsened with palpation improves with time moderate-severe in intensity. Patient reports she has had similar pain less frequently for the past several months and is currently being worked up by her PCP for gallstones.  Associated with nausea and diarrhea.  Denies fever/chills, chest pain/shortness of breath, cough/hemoptysis, dysuria/hematuria, vaginal bleeding/discharge, melena, hematemesis or any additional concerns.  HPI     Past Medical History:  Diagnosis Date  . Acute blood loss anemia 11/13/2016  . Asthma    pulmocort daily  . GERD (gastroesophageal reflux disease)   . Gestational diabetes    diet controlled  . Headache   . Hx of varicella   . Hypertension    pre-eclampsia  . Iron deficiency anemia of pregnancy 11/13/2016  . 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 Menometrorrhagia 12/08/2017  . Postpartum care following cesarean delivery (4/30) 02/25/2015  . Postpartum care following repeat cesarean delivery (1/16) 11/13/2016  . Preeclampsia   . Traumatic injury during pregnancy in third trimester 02/09/2015    Patient Active Problem List   Diagnosis Date Noted  . Obesity 03/31/2019  . Rosacea 09/17/2018  . Asthma 06/30/2018  . Allergic rhinitis 06/26/2018  . 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 Menometrorrhagia 12/08/2017  . Iron deficiency anemia of pregnancy 11/13/2016  . Acute blood loss anemia 11/13/2016  . 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 Tenosynovitis of foot 04/01/2014  .  Pseudoarthrosis of cervical spine ([place]) 04/01/2014  . 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 ADENOIDECTOMY    . CESAREAN SECTION N/A 02/25/2015   Procedure: CESAREAN SECTION;  Surgeon: Azucena Fallen, MD;  Location: [facility];  Service: Obstetrics;  Laterality: N/A;  . CESAREAN SECTION N/A 11/12/2016   Procedure: Repeat CESAREAN SECTION;  Surgeon: Brien Few, MD;  Location: [facility];  Service: Obstetrics;  Laterality: N/A;  . FOOT SURGERY    . KNEE SURGERY    . TONSILLECTOMY       OB History    Gravida  3   Para  2   Term  2   Preterm      AB  1   Living  2     SAB  1   TAB  0   Ectopic  0   Multiple  0   Live Births  2           Family History  Problem Relation Age of Onset  . Heart disease Mother   . Mitral valve prolapse Father   . Asthma Sister   . Hypertension Maternal Grandmother   . Cancer Cousin        AML    Social History   Tobacco Use  . Smoking status: Never Smoker  . Smokeless tobacco: Never Used  Vaping Use  . Vaping Use: Never used  Substance Use Topics  . Alcohol use: No  . Drug use: No    Home Medications Prior to Admission medications   Medication Sig Start Date End Date Taking? Authorizing  Provider  albuterol (VENTOLIN HFA) 108 (90 Base) MCG/ACT inhaler Inhale into the lungs.    [provider]  cetirizine (ZYRTEC) 10 MG tablet Take 10 mg by mouth daily.    [provider]  cyclobenzaprine (FLEXERIL) 10 MG tablet Take 1 tablet (10 mg total) by mouth at bedtime. Patient not taking: Reported on 03/10/2020 10/01/19   Olevia Perches P, DO  FLOVENT HFA 110 MCG/ACT inhaler TAKE 2 PUFFS BY MOUTH TWICE A DAY 03/06/20   Johnson, Megan P, DO  montelukast (SINGULAIR) 10 MG tablet TAKE 1 TABLET BY MOUTH EVERYDAY AT BEDTIME 10/01/19   Johnson, Megan P, DO  omeprazole (PRILOSEC OTC) 20 MG tablet Take 20 mg by mouth daily.    [provider]  ondansetron  (ZOFRAN ODT) 4 MG disintegrating tablet Take 1 tablet (4 mg total) by mouth every 8 (eight) hours as needed for nausea or vomiting. 04/12/20   Harlene Salts A, PA-C  oxycodone (OXY-IR) 5 MG capsule Take 1 capsule (5 mg total) by mouth every 4 (four) hours as needed. 04/12/20   Harlene Salts A, PA-C  tretinoin (RETIN-A) 0.1 % cream Apply topically at bedtime. 11/12/19   Johnson, Megan P, DO  TRI-LO-MARZIA 0.18/0.215/0.25 MG-25 MCG tab Take 1 tablet by mouth daily. 03/19/19   [provider]    Allergies    Hydrocodone and Tdap [tetanus-diphth-acell pertussis]  Review of Systems   Review of Systems Ten systems are reviewed and are negative for acute change except as noted in the HPI  Physical Exam Updated Vital Signs BP 119/66 (BP Location: Right Arm)   Pulse 90   Temp 98.2 F (36.8 C) (Oral)   Resp 16   Ht 5\' 3"  (1.6 m)   Wt 80.6 kg   LMP 04/09/2020   SpO2 96%   BMI 31.48 kg/m   Physical Exam Constitutional:      General: She is not in acute distress.    Appearance: Normal appearance. She is well-developed. She is not ill-appearing or diaphoretic.  HENT:     Head: Normocephalic and atraumatic.  Eyes:     General: Vision grossly intact. Gaze aligned appropriately.     Pupils: Pupils are equal, round, and reactive to light.  Neck:     Trachea: Trachea and phonation normal.  Pulmonary:     Effort: Pulmonary effort is normal. No respiratory distress.  Abdominal:     General: There is no distension.     Palpations: Abdomen is soft.     Tenderness: There is abdominal tenderness in the right upper quadrant. There is no guarding or rebound.  Musculoskeletal:        General: Normal range of motion.     Cervical back: Normal range of motion.  Skin:    General: Skin is warm and dry.  Neurological:     Mental Status: She is alert.     GCS: GCS eye subscore is 4. GCS verbal subscore is 5. GCS motor subscore is 6.     Comments: Speech is clear and goal oriented, follows  commands Major Cranial nerves without deficit, no facial droop Moves extremities without ataxia, coordination intact  Psychiatric:        Behavior: Behavior normal.     ED Results / Procedures / Treatments   Labs (all labs ordered are listed, but only abnormal results are displayed) Labs Reviewed  URINALYSIS, ROUTINE W REFLEX MICROSCOPIC - Abnormal; Notable for the following components:      Result Value  Specific Gravity, Urine <1.005 (*)    Hgb urine dipstick LARGE (*)    All other components within normal limits  CBC WITH DIFFERENTIAL/PLATELET - Abnormal; Notable for the following components:   Hemoglobin 15.1 (*)    Neutro Abs 7.9 (*)    All other components within normal limits  COMPREHENSIVE METABOLIC PANEL - Abnormal; Notable for the following components:   Glucose, Bld 100 (*)    All other components within normal limits  URINALYSIS, MICROSCOPIC (REFLEX) - Abnormal; Notable for the following components:   Bacteria, UA RARE (*)    All other components within normal limits  SARS CORONAVIRUS 2 BY RT PCR (HOSPITAL ORDER, PERFORMED IN [facility])  PREGNANCY, URINE  LIPASE, BLOOD    EKG None  Radiology US Abdomen Limited RUQ  Result Date: 04/12/2020 CLINICAL DATA:  Right upper quadrant pain EXAM: ULTRASOUND ABDOMEN LIMITED RIGHT UPPER QUADRANT COMPARISON:  None. FINDINGS: Gallbladder: Wall-echo-shadow sign is noted consistent with gallbladder filled with shadowing gallstones. No gallbladder wall thickening or pericholecystic fluid. Common bile duct: Diameter: 4 mm Liver: No focal lesion identified. Within normal limits in parenchymal echogenicity. Portal vein is patent on color Doppler imaging with normal direction of blood flow towards the liver. Other: None. IMPRESSION: 1. Cholelithiasis without cholecystitis. Electronically Signed   By: Sharlet Salina M.D.   On: 04/12/2020 15:46    Procedures Procedures (including critical care time)  Medications  Ordered in ED Medications  fentaNYL (SUBLIMAZE) injection 50 mcg (50 mcg Intravenous Given 04/12/20 1421)  ondansetron (ZOFRAN) injection 2 mg (2 mg Intravenous Given 04/12/20 1420)  sodium chloride 0.9 % bolus 1,000 mL (0 mLs Intravenous Stopped 04/12/20 1622)  fentaNYL (SUBLIMAZE) injection 50 mcg (50 mcg Intravenous Given 04/12/20 1626)    ED Course  I have reviewed the triage vital signs and the nursing notes.  Pertinent labs & imaging results that were available during my care of the patient were reviewed by me and considered in my medical decision making (see chart for details).  Clinical Course as of Apr 12 1756  Wed Apr 12, 2020  1711 Dr. Maisie Fus   [BM]  1723 Dr. Maisie Fus   [BM]    Clinical Course User Index [BM] Elizabeth Palau   MDM Rules/Calculators/A&P                          Additional History Obtained: 1. Nursing notes from this visit. 2. Reviewed assessment and plan from patient's primary care office visit yesterday. Right upper quadrant pain there is concern for gallstones or H. pylori and scheduled testing. The for checking labs and stool studies for diarrhea. - I ordered, reviewed and interpreted labs which include: Covid test negative Lipase normal limits, doubt pancreatitis CMP shows glucose 100, otherwise within normal limits. No emergent electrolyte derangement, evidence of acute kidney injury, emergent elevation of LFTs or anion gap. CBC shows no evidence of ptosis to suggest infection or evidence of anemia. Pregnancy test negative. Urinalysis shows no evidence of infection  RUQ [place]:    IMPRESSION:  1. Cholelithiasis without cholecystitis.  Common bile duct diameter 4 mm. --------- Patient received 1000 mL fluid bolus, 2 mg Zofran and 2 doses of fentanyl each 50 mcg. On reassessment she is resting comfortably no acute distress with her husband at bedside. I updated patient on findings as above and she states understanding. Suspect patient may be  experiencing a symptomatic cholelithiasis. Consult placed to general surgery. -  I spoke twice with the general surgeon Dr. Maisie Fus. Advises that there is no indication for emergent cholecystectomy. Advised OR schedule is full for the next 2 days. Advises that patient may be discharged with symptomatic treatment and follow-up with their office as outpatient. - Patient reassessed she is resting comfortably no acute distress. Patient reports she is feeling better, she would like to be discharged and is agreeable for outpatient general surgery follow-up. We had a discussion of if patient not tolerating p.o. she could be admitted for observation, she declined this and does not want to sit on the hospital for 2 days. She elects to be discharged with pain and nausea medication and will call the general surgeon's office tomorrow to schedule a follow-up appointment. She is requesting discharge soon. We had a discussion about pain medication, she reports that she has hives when she takes hydrocodone. She denies any allergic reaction to oxycodone but reports it makes her woozy and nauseous. She requested Tylenol/codeine however will avoid Tylenol given cholelithiasis. Shared decision making was made with patient, her husband and myself. Will prescribe patient oxycodone 5 mg as needed for severe pain along with her nausea medication ODT Zofran 4 mg. They were given contact information for general surgery. Patient was p.o. challenged in the department successfully. Patient states understanding narcotic precautions.  On reevaluation patient is stable.. No acute distress. Doubt appendicitis, diverticulitis, SBO or other emergent intra-abdominal pathologies requiring further ED work-up. At this time there does not appear to be any evidence of an acute emergency medical condition and the patient appears stable for discharge with appropriate outpatient follow up. Diagnosis was discussed with patient who verbalizes understanding  of care plan and is agreeable to discharge. I have discussed return precautions with patient and husband who verbalizes understanding. Patient encouraged to follow-up with their PCP and general surgery. All questions answered.  Patient's case discussed with Dr. Julieanne Manson who agrees with plan to discharge with follow-up.   Note: Portions of this report may have been transcribed using voice recognition software. Every effort was made to ensure accuracy; however, inadvertent computerized transcription errors may still be present. Final Clinical Impression(s) / ED Diagnoses Final diagnoses:  Symptomatic cholelithiasis    Rx / DC Orders ED Discharge Orders         Ordered    oxycodone (OXY-IR) 5 MG capsule  Every 4 hours PRN     Discontinue  Reprint     04/12/20 1737    ondansetron (ZOFRAN ODT) 4 MG disintegrating tablet  Every 8 hours PRN     Discontinue  Reprint     04/12/20 1737           Elizabeth Palau 04/12/20 1758    Tegeler, Canary Brim, MD 04/12/20 [phone]

## 2020-04-12 NOTE — Discharge Instructions (Signed)
At this time there does not appear to be the presence of an emergent medical condition, however there is always the potential for conditions to change. Please read and follow the below instructions.  Please return to the Emergency Department immediately for any new or worsening symptoms. Please be sure to follow up with your Primary Care Provider within one week regarding your visit today; please call their office to schedule an appointment even if you are feeling better for a follow-up visit. You may use the pain medication Oxy-IR (Oxycodone) as prescribed for severe pain.  Oxycodone will make you drowsy so do not drive, drink alcohol, take other sedating medications or perform any dangerous activities such as driving after taking Oxycodone. You may use the nausea medication Zofran as prescribed. Zofran will dissolve under your tongue. Call the general surgeon Dr. Maisie Fus' practice at [facility] to schedule a follow-up appointment.   Get help right away if: You have sudden pain in the upper right side of your belly, and it lasts for more than 2 hours. You have belly pain that lasts for more than 5 hours. You have a fever or chills. You keep feeling sick to your stomach or you keep throwing up. Your skin or the whites of your eyes turn yellow (jaundice). You have dark-colored pee (urine). You have light-colored poop (stool). You have chest pain or difficulty breathing You have any new/concerning or worsening of symptoms.  Please read the additional information packets attached to your discharge summary.  Do not take your medicine if  develop an itchy rash, swelling in your mouth or lips, or difficulty breathing; call 911 and seek immediate emergency medical attention if this occurs.  Note: Portions of this text may have been transcribed using voice recognition software. Every effort was made to ensure accuracy; however, inadvertent computerized transcription errors may still be  present.

## 2020-04-12 NOTE — ED Notes (Signed)
PT given water per PA

## 2020-04-12 NOTE — ED Notes (Signed)
Patient transported to Ultrasound 

## 2020-04-12 NOTE — ED Triage Notes (Signed)
Pt c/o right upper abd pain x 30 mins after eating x 3 weeks, cbc and cmp done yesterday at [facility] with results and [place] oreded for 6/22

## 2020-04-13 LAB — COMPREHENSIVE METABOLIC PANEL
ALT: 27 IU/L (ref 0–32)
AST: 15 IU/L (ref 0–40)
Albumin/Globulin Ratio: 2 (ref 1.2–2.2)
Albumin: 4.9 g/dL — ABNORMAL HIGH (ref 3.8–4.8)
Alkaline Phosphatase: 72 IU/L (ref 48–121)
BUN/Creatinine Ratio: 10 (ref 9–23)
BUN: 8 mg/dL (ref 6–20)
Bilirubin Total: 0.3 mg/dL (ref 0.0–1.2)
CO2: 21 mmol/L (ref 20–29)
Calcium: 10 mg/dL (ref 8.7–10.2)
Chloride: 101 mmol/L (ref 96–106)
Creatinine, Ser: 0.78 mg/dL (ref 0.57–1.00)
GFR calc Af Amer: 115 mL/min/{1.73_m2} (ref >59)
GFR calc non Af Amer: 99 mL/min/{1.73_m2} (ref >59)
Globulin, Total: 2.5 g/dL (ref 1.5–4.5)
Glucose: 100 mg/dL — ABNORMAL HIGH (ref 65–99)
Potassium: 4 mmol/L (ref 3.5–5.2)
Sodium: 139 mmol/L (ref 134–144)
Total Protein: 7.4 g/dL (ref 6.0–8.5)

## 2020-04-13 LAB — CBC WITH DIFFERENTIAL/PLATELET
Basophils Absolute: 0 10*3/uL (ref 0.0–0.2)
Basos: 0 %
EOS (ABSOLUTE): 0.1 10*3/uL (ref 0.0–0.4)
Eos: 2 %
Hematocrit: 42.6 % (ref 34.0–46.6)
Hemoglobin: 14.8 g/dL (ref 11.1–15.9)
Immature Grans (Abs): 0 10*3/uL (ref 0.0–0.1)
Immature Granulocytes: 0 %
Lymphocytes Absolute: 1.7 10*3/uL (ref 0.7–3.1)
Lymphs: 22 %
MCH: 32.7 pg (ref 26.6–33.0)
MCHC: 34.7 g/dL (ref 31.5–35.7)
MCV: 94 fL (ref 79–97)
Monocytes Absolute: 0.5 10*3/uL (ref 0.1–0.9)
Monocytes: 6 %
Neutrophils Absolute: 5.3 10*3/uL (ref 1.4–7.0)
Neutrophils: 70 %
Platelets: 363 10*3/uL (ref 150–450)
RBC: 4.52 x10E6/uL (ref 3.77–5.28)
RDW: 11.7 % (ref 11.7–15.4)
WBC: 7.6 10*3/uL (ref 3.4–10.8)

## 2020-04-13 LAB — H. PYLORI ANTIBODY, IGA: H. pylori, IgA Abs: <9 units (ref 0.0–8.9)

## 2020-04-18 ENCOUNTER — Ambulatory Visit (HOSPITAL_BASED_OUTPATIENT_CLINIC_OR_DEPARTMENT_OTHER): Payer: BC Managed Care – PPO

## 2020-04-19 DIAGNOSIS — K8020 Calculus of gallbladder without cholecystitis without obstruction: Secondary | ICD-10-CM | POA: Diagnosis not present

## 2020-04-21 ENCOUNTER — Encounter: Payer: Self-pay | Admitting: Family Medicine

## 2020-04-21 ENCOUNTER — Ambulatory Visit (INDEPENDENT_AMBULATORY_CARE_PROVIDER_SITE_OTHER): Payer: BC Managed Care – PPO | Admitting: Family Medicine

## 2020-04-21 ENCOUNTER — Other Ambulatory Visit: Payer: Self-pay

## 2020-04-21 VITALS — BP 116/78 | HR 96 | Temp 98.7°F | Wt 173.6 lb

## 2020-04-21 DIAGNOSIS — M9901 Segmental and somatic dysfunction of cervical region: Secondary | ICD-10-CM | POA: Diagnosis not present

## 2020-04-21 DIAGNOSIS — M9900 Segmental and somatic dysfunction of head region: Secondary | ICD-10-CM | POA: Diagnosis not present

## 2020-04-21 DIAGNOSIS — M9908 Segmental and somatic dysfunction of rib cage: Secondary | ICD-10-CM

## 2020-04-21 DIAGNOSIS — M9902 Segmental and somatic dysfunction of thoracic region: Secondary | ICD-10-CM | POA: Diagnosis not present

## 2020-04-21 DIAGNOSIS — K8020 Calculus of gallbladder without cholecystitis without obstruction: Secondary | ICD-10-CM | POA: Diagnosis not present

## 2020-04-21 DIAGNOSIS — M9905 Segmental and somatic dysfunction of pelvic region: Secondary | ICD-10-CM

## 2020-04-21 DIAGNOSIS — M9904 Segmental and somatic dysfunction of sacral region: Secondary | ICD-10-CM

## 2020-04-21 DIAGNOSIS — M9909 Segmental and somatic dysfunction of abdomen and other regions: Secondary | ICD-10-CM | POA: Diagnosis not present

## 2020-04-21 DIAGNOSIS — M542 Cervicalgia: Secondary | ICD-10-CM

## 2020-04-21 DIAGNOSIS — M9903 Segmental and somatic dysfunction of lumbar region: Secondary | ICD-10-CM

## 2020-04-21 NOTE — Progress Notes (Signed)
BP 116/78 (BP Location: Right Arm, Patient Position: Sitting, Cuff Size: Normal)   Pulse 96   Temp 98.7 F (37.1 C) (Oral)   Wt 173 lb 9.6 oz (78.7 kg)   LMP 04/09/2020   BMI 30.75 kg/m    Subjective:    Patient ID: Karla Huber, female    DOB: 07-04-1986, 34 y.o.   MRN: [id number]  HPI: Karla Huber is a 34 y.o. female  Chief Complaint  Patient presents with  . Neck Pain    OMM   Found out that she has gall stones. To have surgery on 7/12. Neck has been hurting. Abdomen has been hurting a lot. Looking forward to the surgery. She notes that her pain in her neck is aching and sore. She does think it may be related to her gall bladder pain. Worse with gall bladder pain and stress. Nothing is really making it better right now. Radiates into her shoulder. This is chronic but worse in the past couple of weeks. She is otherwise doing OK. No other concerns or complaints at this time.   Relevant past medical, surgical, family and social history reviewed and updated as indicated. Interim medical history since our last visit reviewed. Allergies and medications reviewed and updated.  Review of Systems  Constitutional: Negative.   Respiratory: Negative.   Cardiovascular: Negative.   Gastrointestinal: Positive for abdominal pain and nausea. Negative for abdominal distention, anal bleeding, blood in stool, constipation, diarrhea, rectal pain and vomiting.  Genitourinary: Negative.   Musculoskeletal: Positive for arthralgias, myalgias, neck pain and neck stiffness. Negative for back pain, gait problem and joint swelling.  Neurological: Negative.   Psychiatric/Behavioral: Negative.     Per HPI unless specifically indicated above     Objective:    BP 116/78 (BP Location: Right Arm, Patient Position: Sitting, Cuff Size: Normal)   Pulse 96   Temp 98.7 F (37.1 C) (Oral)   Wt 173 lb 9.6 oz (78.7 kg)   LMP 04/09/2020   BMI 30.75 kg/m   Wt Readings from Last 3 Encounters:    04/21/20 173 lb 9.6 oz (78.7 kg)  04/12/20 177 lb 11.1 oz (80.6 kg)  04/11/20 180 lb (81.6 kg)    Physical Exam Vitals and nursing note reviewed.  Constitutional:      General: She is not in acute distress.    Appearance: Normal appearance. She is not ill-appearing.  HENT:     Head: Normocephalic and atraumatic.     Right Ear: External ear normal.     Left Ear: External ear normal.     Nose: Nose normal.     Mouth/Throat:     Mouth: Mucous membranes are moist.     Pharynx: Oropharynx is clear.  Eyes:     Extraocular Movements: Extraocular movements intact.     Conjunctiva/sclera: Conjunctivae normal.     Pupils: Pupils are equal, round, and reactive to light.  Neck:     Vascular: No carotid bruit.  Cardiovascular:     Rate and Rhythm: Normal rate.     Pulses: Normal pulses.  Pulmonary:     Effort: Pulmonary effort is normal. No respiratory distress.  Abdominal:     General: Abdomen is flat. There is no distension.     Palpations: Abdomen is soft. There is no mass.     Tenderness: There is no abdominal tenderness. There is no right CVA tenderness, left CVA tenderness, guarding or rebound.     Hernia: No hernia is present.  Musculoskeletal:  Cervical back: No muscular tenderness.  Lymphadenopathy:     Cervical: No cervical adenopathy.  Skin:    General: Skin is warm and dry.     Capillary Refill: Capillary refill takes less than 2 seconds.     Coloration: Skin is not jaundiced or pale.     Findings: No bruising, erythema, lesion or rash.  Neurological:     General: No focal deficit present.     Mental Status: She is alert. Mental status is at baseline.  Psychiatric:        Mood and Affect: Mood normal.        Behavior: Behavior normal.        Thought Content: Thought content normal.        Judgment: Judgment normal.   Musculoskeletal:  Exam found Decreased ROM, Tissue texture changes, Tenderness to palpation and Asymmetry of patient's  head, neck, thorax, ribs,  lumbar, pelvis, sacrum and abdomen Osteopathic Structural Exam:   Head: hypertonic suboccipital muscles, OAESSR  Neck: C3ESRR, C4ESRL  Thorax: T3-5SRRL, T6ESRR   Ribs: Ribs 6-8 locked up on the L  Lumbar: QL hypertonic bilaterally R>L  Pelvis: L innominate anterior   Sacrum: L on L torsion  Abdomen: diaphragm restricted bilaterally R>L   Results for orders placed or performed during the hospital encounter of 04/12/20  SARS Coronavirus 2 by RT PCR (hospital order, performed in [facility] hospital lab) Nasopharyngeal Nasopharyngeal Swab   Specimen: Nasopharyngeal Swab  Result Value Ref Range   SARS Coronavirus 2 NEGATIVE NEGATIVE  Urinalysis, Routine w reflex microscopic  Result Value Ref Range   Color, Urine YELLOW YELLOW   APPearance CLEAR CLEAR   Specific Gravity, Urine <1.005 (L) 1.005 - 1.030   pH 5.5 5.0 - 8.0   Glucose, UA NEGATIVE NEGATIVE mg/dL   Hgb urine dipstick LARGE (A) NEGATIVE   Bilirubin Urine NEGATIVE NEGATIVE   Ketones, ur NEGATIVE NEGATIVE mg/dL   Protein, ur NEGATIVE NEGATIVE mg/dL   Nitrite NEGATIVE NEGATIVE   Leukocytes,Ua NEGATIVE NEGATIVE  Pregnancy, urine  Result Value Ref Range   Preg Test, Ur NEGATIVE NEGATIVE  CBC with Differential  Result Value Ref Range   WBC 10.2 4.0 - 10.5 K/uL   RBC 4.58 3.87 - 5.11 MIL/uL   Hemoglobin 15.1 (H) 12.0 - 15.0 g/dL   HCT 52.7 36 - 46 %   MCV 95.0 80.0 - 100.0 fL   MCH 33.0 26.0 - 34.0 pg   MCHC 34.7 30.0 - 36.0 g/dL   RDW 78.2 42.3 - 53.6 %   Platelets 375 150 - 400 K/uL   nRBC 0.0 0.0 - 0.2 %   Neutrophils Relative % 77 %   Neutro Abs 7.9 (H) 1.7 - 7.7 K/uL   Lymphocytes Relative 17 %   Lymphs Abs 1.7 0.7 - 4.0 K/uL   Monocytes Relative 5 %   Monocytes Absolute 0.6 0 - 1 K/uL   Eosinophils Relative 0 %   Eosinophils Absolute 0.0 0 - 0 K/uL   Basophils Relative 1 %   Basophils Absolute 0.1 0 - 0 K/uL   Immature Granulocytes 0 %   Abs Immature Granulocytes 0.02 0.00 - 0.07 K/uL  Comprehensive  metabolic panel  Result Value Ref Range   Sodium 136 135 - 145 mmol/L   Potassium 3.9 3.5 - 5.1 mmol/L   Chloride 102 98 - 111 mmol/L   CO2 22 22 - 32 mmol/L   Glucose, Bld 100 (H) 70 - 99 mg/dL   BUN 7  6 - 20 mg/dL   Creatinine, Ser 4.48 0.44 - 1.00 mg/dL   Calcium 9.6 8.9 - 18.5 mg/dL   Total Protein 8.1 6.5 - 8.1 g/dL   Albumin 4.5 3.5 - 5.0 g/dL   AST 17 15 - 41 U/L   ALT 28 0 - 44 U/L   Alkaline Phosphatase 59 38 - 126 U/L   Total Bilirubin 0.9 0.3 - 1.2 mg/dL   GFR calc non Af Amer >60 >60 mL/min   GFR calc Af Amer >60 >60 mL/min   Anion gap 12 5 - 15  Lipase, blood  Result Value Ref Range   Lipase 30 11 - 51 U/L  Urinalysis, Microscopic (reflex)  Result Value Ref Range   RBC / HPF 0-5 0 - 5 RBC/hpf   WBC, UA 0-5 0 - 5 WBC/hpf   Bacteria, UA RARE (A) NONE SEEN   Squamous Epithelial / LPF 0-5 0 - 5      Assessment & Plan:   Problem List Items Addressed This Visit    None    Visit Diagnoses    Gall stones    -  Primary   To have CCY on 7/12. Continue to monitor. Warning signs to go to ER discussed today.   Neck pain       In acute exacerbation likely in part due to radiation from her gall stones. She has somatic dysfunction contributing. Treated today with good results as below.    Head region somatic dysfunction       Thoracic segment dysfunction       Cervical (neck) region somatic dysfunction       Somatic dysfunction of rib region       Somatic dysfunction of pelvis region       Somatic dysfunction of sacral region       Somatic dysfunction of lumbar region       Segmental dysfunction of abdomen         After verbal consent was obtained, patient was treated today with osteopathic manipulative medicine to the regions of the head, neck, thorax, ribs, lumbar, pelvis, sacrum and abdomen using the techniques of cranial, myofascial release, counterstrain, muscle energy, HVLA and soft tissue. Areas of compensation relating to her primary pain source also treated.  Patient tolerated the procedure well with good objective and good subjective improvement in symptoms. She left the room in good condition. She was advised to stay well hydrated and that she may have some soreness following the procedure. If not improving or worsening, she will call and come in.  She will return for reevaluation  following surgery.   Follow up plan: Return in about 4 weeks (around 05/19/2020).

## 2020-04-23 DIAGNOSIS — H6983 Other specified disorders of Eustachian tube, bilateral: Secondary | ICD-10-CM | POA: Diagnosis not present

## 2020-04-23 DIAGNOSIS — J040 Acute laryngitis: Secondary | ICD-10-CM | POA: Diagnosis not present

## 2020-04-23 DIAGNOSIS — R0982 Postnasal drip: Secondary | ICD-10-CM | POA: Diagnosis not present

## 2020-05-05 ENCOUNTER — Telehealth: Payer: BC Managed Care – PPO | Admitting: Emergency Medicine

## 2020-05-05 DIAGNOSIS — L309 Dermatitis, unspecified: Secondary | ICD-10-CM

## 2020-05-05 MED ORDER — TRIAMCINOLONE ACETONIDE 0.025 % EX OINT
1.0000 | TOPICAL_OINTMENT | Freq: Two times a day (BID) | CUTANEOUS | 0 refills | Status: DC
Start: 2020-05-05 — End: 2020-11-29

## 2020-05-05 NOTE — Progress Notes (Signed)
E-Visit for Eczema  We are sorry that you are not feeling well. Here is how we plan to help! Based on what you shared with me it looks like you have eczema (atopic dermatitis).  Although the cause of eczema is not completely understood, genetics appear to play a strong role, and people with a family history of eczema are at increased risk of developing the condition. In most people with eczema, there is a genetic abnormality in the outermost layer of the skin, called the epidermis   Most people with eczema develop their first symptoms as children, before the age of 37. Intense itching of the skin, patches of redness, small bumps, and skin flaking are common. Scratching can further inflame the skin and worsen the itching. The itchiness may be more noticeable at nighttime.  Eczema commonly affects the back of the neck, the elbow creases, and the backs of the knees. Other affected areas may include the face, wrists, and forearms. The skin may become thickened and darkened, or even scarred, from repeated scratching. Eliminating factors that aggravate your eczema symptoms can help to control the symptoms. Possible triggers may include: ? Cold or dry environments ? Sweating ? Emotional stress or anxiety ? Rapid temperature changes ? Exposure to certain chemicals or cleaning solutions, including soaps and detergents, perfumes and cosmetics, wool or synthetic fibers, dust, sand, and cigarette smoke Keeping your skin hydrated Emollients -- Emollients are creams and ointments that moisturize the skin and prevent it from drying out. The best emollients for people with eczema are thick creams (such as Eucerin, Cetaphil, and Nutraderm) or ointments (such as petroleum jelly, Aquaphor, and Vaseline), which contain little to no water. Emollients are most effective when applied immediately after bathing. Emollients can be applied twice a day or more often if needed. Lotions contain more water than creams and  ointments and are less effective for moisturizing the skin. Bathing -- It is not clear if showers or baths are better for keeping the skin hydrated. Lukewarm baths or showers can hydrate and cool the skin, temporarily relieving itching from eczema. An unscented, mild soap or non-soap cleanser (such as Cetaphil) should be used sparingly. Apply an emollient immediately after bathing or showering to prevent your skin from drying out as a result of water evaporation. Emollient bath additives (products you add to the bath water) have not been found to help relieve symptoms. Hot or long baths (more than 10 to 15 minutes) and showers should be avoided since they can dry out the skin.  Based on what you shared with me you may have eczema.   Triamcinalone ointment (or cream). Apply to the effected areas twice per day.    I recommend dilute bleach baths for people with eczema. These baths help to decrease the number of bacteria on the skin that can cause infections or worsen symptoms. To prepare a bleach bath, one-fourth to one-half cup of bleach is placed in a full bathtub (about 40 gallons) of water. Bleach baths are usually taken for 5 to 10 minutes twice per week and should be followed by application of an emollient (listed above). I recommend you take Benadryl 25mg  - 50mg  every 4 hours to control the symptoms (including itching) but if they last over 24 hours it is best that you see an office based provider for follow up.  HOME CARE: . Take lukewarm showers or baths . Apply creams and ointments to prevent the skin from drying (Eucerin, Cetaphil, Nutraderm, petroleum jelly, Aquaphor or  Vaseline) - these products contain less water than other lotions and are more effective for moisturizing the skin . Limit exposure to cold or dry environments, sweating, emotional stress and anxiety, rapid temperature changes and exposure to chemicals and cleaning products, soaps and detergents, perfumes, cosmetics, wool and  synthetic fibers, dust, sand and cigarette- factors which can aggravate eczema symptoms.  . Use a hydrocortisone cream once or twice a day . Take an antihistamine like Benadryl for widespread rashes that itch.  The adult dosage of Benadryl is 25-50 mg by mouth 4 times daily. Caution: This type of medication may cause sleepiness.  Do not drink alcohol, drive, or operate dangerous machinery while taking antihistamines.  Do not take these medications if you have prostate enlargement.  Read the package instructions thoroughly on all medications that you take.  GET HELP RIGHT AWAY IF: . Symptoms that don't go away after treatment. . Severe itching that persists. . You develop a fever. . Your skin begins to drain. . You have a sore throat. . You become short of breath.  MAKE SURE YOU    Understand these instructions.  Will watch your condition.  Will get help right away if you are not doing well or get worse.  Thank you for choosing an e-visit. Your e-visit answers were reviewed by a board certified advanced clinical practitioner to complete your personal care plan. Depending upon the condition, your plan could have included both over the counter or prescription medications. Please review your pharmacy choice. Make sure the pharmacy is open so you can pick up prescription now. If there is a problem, you may contact your provider through [employer] and have the prescription routed to another pharmacy. Your safety is important to [place]. If you have drug allergies check your prescription carefully.  For the next 24 hours you can use MyChart to ask questions about today's visit, request a non-urgent call back, or ask for a work or school excuse. You will get an email in the next two days asking about your experience. I hope that your e-visit has been valuable and will speed your recovery        Approximately 5 minutes was used in reviewing the patient's chart, questionnaire, prescribing  medications, and documentation.

## 2020-05-08 DIAGNOSIS — Z885 Allergy status to narcotic agent status: Secondary | ICD-10-CM | POA: Diagnosis not present

## 2020-05-08 DIAGNOSIS — Z79899 Other long term (current) drug therapy: Secondary | ICD-10-CM | POA: Diagnosis not present

## 2020-05-08 DIAGNOSIS — K8080 Other cholelithiasis without obstruction: Secondary | ICD-10-CM | POA: Diagnosis not present

## 2020-05-08 DIAGNOSIS — J45909 Unspecified asthma, uncomplicated: Secondary | ICD-10-CM | POA: Diagnosis not present

## 2020-05-08 DIAGNOSIS — K8020 Calculus of gallbladder without cholecystitis without obstruction: Secondary | ICD-10-CM | POA: Diagnosis not present

## 2020-05-08 DIAGNOSIS — K8010 Calculus of gallbladder with chronic cholecystitis without obstruction: Secondary | ICD-10-CM | POA: Diagnosis not present

## 2020-05-08 DIAGNOSIS — Z888 Allergy status to other drugs, medicaments and biological substances status: Secondary | ICD-10-CM | POA: Diagnosis not present

## 2020-05-19 ENCOUNTER — Other Ambulatory Visit: Payer: Self-pay

## 2020-05-19 ENCOUNTER — Ambulatory Visit (INDEPENDENT_AMBULATORY_CARE_PROVIDER_SITE_OTHER): Payer: BC Managed Care – PPO | Admitting: Family Medicine

## 2020-05-19 ENCOUNTER — Encounter: Payer: Self-pay | Admitting: Family Medicine

## 2020-05-19 VITALS — BP 107/72 | HR 92 | Temp 98.7°F | Wt 167.6 lb

## 2020-05-19 DIAGNOSIS — M9901 Segmental and somatic dysfunction of cervical region: Secondary | ICD-10-CM | POA: Diagnosis not present

## 2020-05-19 DIAGNOSIS — M9908 Segmental and somatic dysfunction of rib cage: Secondary | ICD-10-CM

## 2020-05-19 DIAGNOSIS — R0781 Pleurodynia: Secondary | ICD-10-CM | POA: Diagnosis not present

## 2020-05-19 DIAGNOSIS — M9900 Segmental and somatic dysfunction of head region: Secondary | ICD-10-CM

## 2020-05-19 DIAGNOSIS — M9903 Segmental and somatic dysfunction of lumbar region: Secondary | ICD-10-CM

## 2020-05-19 DIAGNOSIS — M9909 Segmental and somatic dysfunction of abdomen and other regions: Secondary | ICD-10-CM

## 2020-05-19 DIAGNOSIS — M9905 Segmental and somatic dysfunction of pelvic region: Secondary | ICD-10-CM

## 2020-05-19 DIAGNOSIS — M9904 Segmental and somatic dysfunction of sacral region: Secondary | ICD-10-CM

## 2020-05-19 NOTE — Progress Notes (Signed)
BP 107/72 (BP Location: Left Arm, Patient Position: Sitting, Cuff Size: Normal)   Pulse 92   Temp 98.7 F (37.1 C) (Oral)   Wt 167 lb 9.6 oz (76 kg)   SpO2 99%   BMI 29.69 kg/m    Subjective:    Patient ID: Karla Huber, female    DOB: 25-Jan-1986, 34 y.o.   MRN: [id number]  HPI: Karla Huber is a 34 y.o. female  Chief Complaint  Patient presents with  . Pain    rib OMM   Had a CCY about 10 days ago. Feeling pretty good. Having some pain in her belly button incision and some discomfort in her R sided ribs and the back of her head. Her pain is aching and sore. Worse with movement, better with rest. Pain started after her surgery. It doesn't radiate. It's pretty constant. It's also pretty mild but she feels like she's not quite moving right. She is otherwise feeling well with no other concerns or complaints at this time.   Relevant past medical, surgical, family and social history reviewed and updated as indicated. Interim medical history since our last visit reviewed. Allergies and medications reviewed and updated.  Review of Systems  Constitutional: Negative.   Respiratory: Negative.   Cardiovascular: Negative.   Gastrointestinal: Positive for abdominal pain. Negative for abdominal distention, anal bleeding, blood in stool, constipation, diarrhea, nausea, rectal pain and vomiting.  Musculoskeletal: Positive for myalgias and neck stiffness. Negative for arthralgias, back pain, gait problem, joint swelling and neck pain.  Skin: Negative.   Neurological: Negative.   Psychiatric/Behavioral: Negative.     Per HPI unless specifically indicated above     Objective:    BP 107/72 (BP Location: Left Arm, Patient Position: Sitting, Cuff Size: Normal)   Pulse 92   Temp 98.7 F (37.1 C) (Oral)   Wt 167 lb 9.6 oz (76 kg)   SpO2 99%   BMI 29.69 kg/m   Wt Readings from Last 3 Encounters:  05/19/20 167 lb 9.6 oz (76 kg)  04/21/20 173 lb 9.6 oz (78.7 kg)  04/12/20 177 lb  11.1 oz (80.6 kg)    Physical Exam Vitals and nursing note reviewed.  Constitutional:      General: She is not in acute distress.    Appearance: Normal appearance. She is not ill-appearing.  HENT:     Head: Normocephalic and atraumatic.     Right Ear: External ear normal.     Left Ear: External ear normal.     Nose: Nose normal.     Mouth/Throat:     Mouth: Mucous membranes are moist.     Pharynx: Oropharynx is clear.  Eyes:     Extraocular Movements: Extraocular movements intact.     Conjunctiva/sclera: Conjunctivae normal.     Pupils: Pupils are equal, round, and reactive to light.  Neck:     Vascular: No carotid bruit.  Cardiovascular:     Rate and Rhythm: Normal rate.     Pulses: Normal pulses.  Pulmonary:     Effort: Pulmonary effort is normal. No respiratory distress.  Abdominal:     General: Abdomen is flat. There is no distension.     Palpations: Abdomen is soft. There is no mass.     Tenderness: There is no abdominal tenderness. There is no right CVA tenderness, left CVA tenderness, guarding or rebound.     Hernia: No hernia is present.  Musculoskeletal:     Cervical back: No muscular tenderness.  Lymphadenopathy:  Cervical: No cervical adenopathy.  Skin:    General: Skin is warm and dry.     Capillary Refill: Capillary refill takes less than 2 seconds.     Coloration: Skin is not jaundiced or pale.     Findings: No bruising, erythema, lesion or rash.  Neurological:     General: No focal deficit present.     Mental Status: She is alert. Mental status is at baseline.  Psychiatric:        Mood and Affect: Mood normal.        Behavior: Behavior normal.        Thought Content: Thought content normal.        Judgment: Judgment normal.   Musculoskeletal:  Exam found Decreased ROM, Tissue texture changes, Tenderness to palpation and Asymmetry of patient's  head, neck, ribs, lumbar, pelvis, sacrum and abdomen Osteopathic Structural Exam:   Head: hypertonic  suboccipital muscles  Neck: C3ESRR  Ribs: Ribs 6-10 locked up on the R   Lumbar: QL hypertonic on the R, L3-5SRRL  Pelvis: anterior R innominate   Sacrum:  R on R torsion  Abdomen: diaphragm hypertonic on the R    Results for orders placed or performed during the hospital encounter of 04/12/20  SARS Coronavirus 2 by RT PCR (hospital order, performed in [facility] hospital lab) Nasopharyngeal Nasopharyngeal Swab   Specimen: Nasopharyngeal Swab  Result Value Ref Range   SARS Coronavirus 2 NEGATIVE NEGATIVE  Urinalysis, Routine w reflex microscopic  Result Value Ref Range   Color, Urine YELLOW YELLOW   APPearance CLEAR CLEAR   Specific Gravity, Urine <1.005 (L) 1.005 - 1.030   pH 5.5 5.0 - 8.0   Glucose, UA NEGATIVE NEGATIVE mg/dL   Hgb urine dipstick LARGE (A) NEGATIVE   Bilirubin Urine NEGATIVE NEGATIVE   Ketones, ur NEGATIVE NEGATIVE mg/dL   Protein, ur NEGATIVE NEGATIVE mg/dL   Nitrite NEGATIVE NEGATIVE   Leukocytes,Ua NEGATIVE NEGATIVE  Pregnancy, urine  Result Value Ref Range   Preg Test, Ur NEGATIVE NEGATIVE  CBC with Differential  Result Value Ref Range   WBC 10.2 4.0 - 10.5 K/uL   RBC 4.58 3.87 - 5.11 MIL/uL   Hemoglobin 15.1 (H) 12.0 - 15.0 g/dL   HCT 16.1 36 - 46 %   MCV 95.0 80.0 - 100.0 fL   MCH 33.0 26.0 - 34.0 pg   MCHC 34.7 30.0 - 36.0 g/dL   RDW 09.6 04.5 - 40.9 %   Platelets 375 150 - 400 K/uL   nRBC 0.0 0.0 - 0.2 %   Neutrophils Relative % 77 %   Neutro Abs 7.9 (H) 1.7 - 7.7 K/uL   Lymphocytes Relative 17 %   Lymphs Abs 1.7 0.7 - 4.0 K/uL   Monocytes Relative 5 %   Monocytes Absolute 0.6 0 - 1 K/uL   Eosinophils Relative 0 %   Eosinophils Absolute 0.0 0 - 0 K/uL   Basophils Relative 1 %   Basophils Absolute 0.1 0 - 0 K/uL   Immature Granulocytes 0 %   Abs Immature Granulocytes 0.02 0.00 - 0.07 K/uL  Comprehensive metabolic panel  Result Value Ref Range   Sodium 136 135 - 145 mmol/L   Potassium 3.9 3.5 - 5.1 mmol/L   Chloride 102 98 - 111  mmol/L   CO2 22 22 - 32 mmol/L   Glucose, Bld 100 (H) 70 - 99 mg/dL   BUN 7 6 - 20 mg/dL   Creatinine, Ser 8.11 0.44 - 1.00 mg/dL  Calcium 9.6 8.9 - 10.3 mg/dL   Total Protein 8.1 6.5 - 8.1 g/dL   Albumin 4.5 3.5 - 5.0 g/dL   AST 17 15 - 41 U/L   ALT 28 0 - 44 U/L   Alkaline Phosphatase 59 38 - 126 U/L   Total Bilirubin 0.9 0.3 - 1.2 mg/dL   GFR calc non Af Amer >60 >60 mL/min   GFR calc Af Amer >60 >60 mL/min   Anion gap 12 5 - 15  Lipase, blood  Result Value Ref Range   Lipase 30 11 - 51 U/L  Urinalysis, Microscopic (reflex)  Result Value Ref Range   RBC / HPF 0-5 0 - 5 RBC/hpf   WBC, UA 0-5 0 - 5 WBC/hpf   Bacteria, UA RARE (A) NONE SEEN   Squamous Epithelial / LPF 0-5 0 - 5      Assessment & Plan:   Problem List Items Addressed This Visit    None    Visit Diagnoses    Rib pain    -  Primary   She does have somatic dysfunction that is contributing to her symptoms. Treated today with good results as below. Continue to monitor.    Head region somatic dysfunction       Cervical (neck) region somatic dysfunction       Somatic dysfunction of rib region       Somatic dysfunction of pelvis region       Somatic dysfunction of sacral region       Somatic dysfunction of lumbar region       Segmental dysfunction of abdomen         After verbal consent was obtained, patient was treated today with osteopathic manipulative medicine to the regions of the head, neck, ribs, lumbar, pelvis, sacrum and abdomen using the techniques of myofascial release, counterstrain, muscle energy, HVLA and soft tissue. Areas of compensation relating to her primary pain source also treated. Patient tolerated the procedure well with good objective and good subjective improvement in symptoms. She left the room in good condition. She was advised to stay well hydrated and that she may have some soreness following the procedure. If not improving or worsening, she will call and come in. She will return for  reevaluation  on a PRN basis.  Follow up plan: Return if symptoms worsen or fail to improve.

## 2020-05-31 ENCOUNTER — Telehealth: Payer: BC Managed Care – PPO | Admitting: Nurse Practitioner

## 2020-05-31 DIAGNOSIS — B3731 Acute candidiasis of vulva and vagina: Secondary | ICD-10-CM

## 2020-05-31 DIAGNOSIS — B373 Candidiasis of vulva and vagina: Secondary | ICD-10-CM | POA: Diagnosis not present

## 2020-05-31 MED ORDER — FLUCONAZOLE 150 MG PO TABS
150.0000 mg | ORAL_TABLET | Freq: Once | ORAL | 0 refills | Status: AC
Start: 2020-05-31 — End: 2020-05-31

## 2020-05-31 NOTE — Progress Notes (Signed)

## 2020-06-04 ENCOUNTER — Other Ambulatory Visit: Payer: Self-pay | Admitting: Family Medicine

## 2020-06-04 NOTE — Telephone Encounter (Signed)
Requested Prescriptions  Pending Prescriptions Disp Refills  . FLOVENT HFA 110 MCG/ACT inhaler [Pharmacy Med Name: FLOVENT HFA 110 MCG INHALER] 1 each 1    Sig: TAKE 2 PUFFS BY MOUTH TWICE A DAY     Pulmonology:  Corticosteroids Passed - 06/04/2020  9:11 AM      Passed - Valid encounter within last 12 months    Recent Outpatient Visits          2 weeks ago Rib pain   [facility] [place], [place], DO   1 month ago Chief Executive Officer stones   [facility] [place], [place], DO   1 month ago RUQ pain   [facility] [place], [place], DO   2 months ago Myalgia   [facility] [place], [place], DO   4 months ago Neck pain   [facility] [place], [place], DO

## 2020-06-20 ENCOUNTER — Telehealth: Payer: BC Managed Care – PPO | Admitting: Physician Assistant

## 2020-06-20 DIAGNOSIS — J019 Acute sinusitis, unspecified: Secondary | ICD-10-CM

## 2020-06-20 MED ORDER — FLUCONAZOLE 150 MG PO TABS: 150.0000 mg | ORAL_TABLET | Freq: Once | ORAL | 0 refills | Status: AC

## 2020-06-20 MED ORDER — AMOXICILLIN-POT CLAVULANATE 875-125 MG PO TABS
1.0000 | ORAL_TABLET | Freq: Two times a day (BID) | ORAL | 0 refills | Status: DC
Start: 2020-06-20 — End: 2020-11-29

## 2020-06-20 NOTE — Progress Notes (Signed)
We are sorry that you are not feeling well.  Here is how we plan to help!  Based on what you have shared with me it looks like you have sinusitis.  Sinusitis is inflammation and infection in the sinus cavities of the head.  Based on your presentation I believe you most likely have Acute Bacterial Sinusitis.  This is an infection caused by bacteria and is treated with antibiotics. I have prescribed Augmentin 875mg /125mg  one tablet twice daily with food, for 7 days. I have also prescribed a dose of diflucan to use if you experience symptoms of a yeast infection. You may use an oral decongestant such as Mucinex D or if you have glaucoma or high blood pressure use plain Mucinex. Saline nasal spray help and can safely be used as often as needed for congestion.  If you develop worsening sinus pain, fever or notice severe headache and vision changes, or if symptoms are not better after completion of antibiotic, please schedule an appointment with a health care provider.    Sinus infections are not as easily transmitted as other respiratory infection, however we still recommend that you avoid close contact with loved ones, especially the very young and elderly.  Remember to wash your hands thoroughly throughout the day as this is the number one way to prevent the spread of infection!  Home Care:  Only take medications as instructed by your medical team.  Complete the entire course of an antibiotic.  Do not take these medications with alcohol.  A steam or ultrasonic humidifier can help congestion.  You can place a towel over your head and breathe in the steam from hot water coming from a faucet.  Avoid close contacts especially the very young and the elderly.  Cover your mouth when you cough or sneeze.  Always remember to wash your hands.  Get Help Right Away If:  You develop worsening fever or sinus pain.  You develop a severe head ache or visual changes.  Your symptoms persist after you have  completed your treatment plan.  Make sure you  Understand these instructions.  Will watch your condition.  Will get help right away if you are not doing well or get worse.  Your e-visit answers were reviewed by a board certified advanced clinical practitioner to complete your personal care plan.  Depending on the condition, your plan could have included both over the counter or prescription medications.  If there is a problem please reply  once you have received a response from your provider.  Your safety is important to .  If you have drug allergies check your prescription carefully.    You can use MyChart to ask questions about today's visit, request a non-urgent call back, or ask for a work or school excuse for 24 hours related to this e-Visit. If it has been greater than 24 hours you will need to follow up with your provider, or enter a new e-Visit to address those concerns.  You will get an e-mail in the next two days asking about your experience.  I hope that your e-visit has been valuable and will speed your recovery. Thank you for using e-visits.  Approximately 5 minutes was spent documenting and reviewing patient's chart.

## 2020-06-27 DIAGNOSIS — D509 Iron deficiency anemia, unspecified: Secondary | ICD-10-CM | POA: Diagnosis not present

## 2020-07-13 DIAGNOSIS — Z6828 Body mass index (BMI) 28.0-28.9, adult: Secondary | ICD-10-CM | POA: Diagnosis not present

## 2020-07-13 DIAGNOSIS — D509 Iron deficiency anemia, unspecified: Secondary | ICD-10-CM | POA: Diagnosis not present

## 2020-07-14 DIAGNOSIS — H40003 Preglaucoma, unspecified, bilateral: Secondary | ICD-10-CM | POA: Diagnosis not present

## 2020-07-24 DIAGNOSIS — Z01419 Encounter for gynecological examination (general) (routine) without abnormal findings: Secondary | ICD-10-CM | POA: Diagnosis not present

## 2020-07-24 DIAGNOSIS — Z6830 Body mass index (BMI) 30.0-30.9, adult: Secondary | ICD-10-CM | POA: Diagnosis not present

## 2020-08-06 ENCOUNTER — Other Ambulatory Visit: Payer: Self-pay | Admitting: Family Medicine

## 2020-08-30 DIAGNOSIS — L718 Other rosacea: Secondary | ICD-10-CM | POA: Diagnosis not present

## 2020-09-11 ENCOUNTER — Other Ambulatory Visit: Payer: Self-pay | Admitting: Family Medicine

## 2020-09-29 DIAGNOSIS — D508 Other iron deficiency anemias: Secondary | ICD-10-CM | POA: Diagnosis not present

## 2020-11-02 ENCOUNTER — Other Ambulatory Visit: Payer: Self-pay | Admitting: Family Medicine

## 2020-11-08 ENCOUNTER — Telehealth: Payer: BC Managed Care – PPO | Admitting: Nurse Practitioner

## 2020-11-08 DIAGNOSIS — B370 Candidal stomatitis: Secondary | ICD-10-CM | POA: Diagnosis not present

## 2020-11-08 MED ORDER — FLUCONAZOLE 150 MG PO TABS: 150.0000 mg | ORAL_TABLET | Freq: Once | ORAL | 0 refills | Status: AC

## 2020-11-08 NOTE — Progress Notes (Signed)
E-Visit for Mouth Ulcers  We are sorry that you are not feeling well.  Here is how we plan to help!  Based on what you have shared with me, it appears that you do havethrush , which is a yeast infection in the oral cavity.     The following medications should decrease the discomfort and help with healing.  diflucan 150mg  1 po now   While the exact causes are unknown, some common causes and factors that may aggravate mouth ulcers include: . Genetics - Sometimes mouth ulcers run in families . High alcohol intake . Acidic foods such as citrus fruits like pineapple, grapefruit, orange fruits/juices, may aggravate mouth ulcers . Other foods high in acidity or spice such as coffee, chocolate, chips, pretzels, eggs, nuts, cheese . Quitting smoking . Injury caused by biting the tongue or inside of the cheek . Diet lacking in B-12, zinc, folic acid or iron . Female hormone shifts with menstruation . Excessive fatigue, emotional stress or anxiety Prevention: . Talk to your doctor if you are taking meds that are known to cause mouth ulcers such as:   Anti-inflammatory drugs (for example Ibuprofen, Naproxen sodium), pain killers, Beta blockers, Oral nicotine replacement drugs, Some street drugs (heroin).   . Avoid allowing any tablets to dissolve in your mouth that are meant to swallowed whole . Avoid foods/drinks that trigger or worsen symptoms . Keep your mouth clean with daily brushing and flossing  Home Care: . The goal with treatment is to ease the pain where ulcers occur and help them heal as quickly as possible.  There is no medical treatment to prevent mouth ulcers from coming back or recurring.  . Avoid spicy and acidic foods . Eat soft foods and avoid rough, crunchy foods . Avoid chewing gum . Do not use toothpaste that contains sodium lauryl sulphite . Use a straw to drink which helps avoid liquids toughing the ulcers near the front of your mouth . Use a very soft toothbrush . If you  have dentures or dental hardware that you feel is not fitting well or contributing to his, please see your dentist. . Use saltwater mouthwash which helps healing. Dissolve a  teaspoon of salt in a glass of warm water. Swish around your mouth and spit it out. This can be used as needed if it is soothing.   GET HELP RIGHT AWAY IF: . Persistent ulcers require checking IN PERSON (face to face). Any mouth lesion lasting longer than a month should be seen by your DENTIST as soon as possible for evaluation for possible oral cancer. . If you have a non-painful ulcer in 1 or more areas of your mouth . Ulcers that are spreading, are very large or particularly painful . Ulcers last longer than one week without improving on treatment . If you develop a fever, swollen glands and begin to feel unwell . Ulcers that developed after starting a new medication MAKE SURE YOU:  Understand these instructions.  Will watch your condition.  Will get help right away if you are not doing well or get worse.  Your e-visit answers were reviewed by a board certified advanced clinical practitioner to complete your personal care plan.  Depending upon the condition, your plan could have included both over the counter or prescription medications.    Please review your pharmacy choice.  Be sure that the pharmacy you have chosen is open so that you can pick up your prescription now.  If there is a problem,  you can message your provider in MyChart to have the prescription routed to another pharmacy.    Your safety is important to [place].  If you have drug allergies check our prescription carefully.  For the next 24 hours you can use MyChart to ask questions about today's visit, request a non-urgent call back, or ask for a work or school excuse from your e-visit provider.  You will get an email with a survey asking about your experience and to give [place] any feedback.  I hope that your e-visit has been valuable and will speed your  recovery.  5-10 minutes spent reviewing and documenting in chart.

## 2020-11-15 ENCOUNTER — Encounter: Payer: BC Managed Care – PPO | Admitting: Family Medicine

## 2020-11-29 ENCOUNTER — Other Ambulatory Visit: Payer: Self-pay

## 2020-11-29 ENCOUNTER — Encounter: Payer: Self-pay | Admitting: Family Medicine

## 2020-11-29 ENCOUNTER — Ambulatory Visit (INDEPENDENT_AMBULATORY_CARE_PROVIDER_SITE_OTHER): Payer: BC Managed Care – PPO | Admitting: Family Medicine

## 2020-11-29 VITALS — BP 127/81 | HR 87 | Temp 98.8°F | Ht 62.5 in | Wt 174.2 lb

## 2020-11-29 DIAGNOSIS — Z0000 Encounter for general adult medical examination without abnormal findings: Secondary | ICD-10-CM | POA: Diagnosis not present

## 2020-11-29 DIAGNOSIS — J4520 Mild intermittent asthma, uncomplicated: Secondary | ICD-10-CM

## 2020-11-29 DIAGNOSIS — G43709 Chronic migraine without aura, not intractable, without status migrainosus: Secondary | ICD-10-CM

## 2020-11-29 DIAGNOSIS — G43909 Migraine, unspecified, not intractable, without status migrainosus: Secondary | ICD-10-CM | POA: Insufficient documentation

## 2020-11-29 LAB — URINALYSIS, ROUTINE W REFLEX MICROSCOPIC
Bilirubin, UA: NEGATIVE
Glucose, UA: NEGATIVE
Ketones, UA: NEGATIVE
Leukocytes,UA: NEGATIVE
Nitrite, UA: NEGATIVE
Protein,UA: NEGATIVE
RBC, UA: NEGATIVE
Specific Gravity, UA: <1.005 — ABNORMAL LOW (ref 1.005–1.030)
Urobilinogen, Ur: 0.2 mg/dL (ref 0.2–1.0)
pH, UA: 5.5 (ref 5.0–7.5)

## 2020-11-29 MED ORDER — PREDNISONE 50 MG PO TABS: 50.0000 mg | ORAL_TABLET | Freq: Every day | ORAL | 0 refills | Status: DC

## 2020-11-29 MED ORDER — SUMATRIPTAN SUCCINATE 100 MG PO TABS
100.0000 mg | ORAL_TABLET | ORAL | 12 refills | Status: DC | PRN
Start: 2020-11-29 — End: 2021-12-05

## 2020-11-29 MED ORDER — ALBUTEROL SULFATE HFA 108 (90 BASE) MCG/ACT IN AERS: 1.0000 | INHALATION_SPRAY | RESPIRATORY_TRACT | 6 refills | Status: DC | PRN

## 2020-11-29 MED ORDER — MONTELUKAST SODIUM 10 MG PO TABS: 10.0000 mg | ORAL_TABLET | Freq: Every day | ORAL | 1 refills | Status: DC

## 2020-11-29 NOTE — Patient Instructions (Signed)
Health Maintenance, Female Adopting a healthy lifestyle and getting preventive care are important in promoting health and wellness. Ask your health care provider about:  The right schedule for you to have regular tests and exams.  Things you can do on your own to prevent diseases and keep yourself healthy. What should I know about diet, weight, and exercise? Eat a healthy diet  Eat a diet that includes plenty of vegetables, fruits, low-fat dairy products, and lean protein.  Do not eat a lot of foods that are high in solid fats, added sugars, or sodium.   Maintain a healthy weight Body mass index (BMI) is used to identify weight problems. It estimates body fat based on height and weight. Your health care provider can help determine your BMI and help you achieve or maintain a healthy weight. Get regular exercise Get regular exercise. This is one of the most important things you can do for your health. Most adults should:  Exercise for at least 150 minutes each week. The exercise should increase your heart rate and make you sweat (moderate-intensity exercise).  Do strengthening exercises at least twice a week. This is in addition to the moderate-intensity exercise.  Spend less time sitting. Even light physical activity can be beneficial. Watch cholesterol and blood lipids Have your blood tested for lipids and cholesterol at 36 years of age, then have this test every 5 years. Have your cholesterol levels checked more often if:  Your lipid or cholesterol levels are high.  You are older than 36 years of age.  You are at high risk for heart disease. What should I know about cancer screening? Depending on your health history and family history, you may need to have cancer screening at various ages. This may include screening for:  Breast cancer.  Cervical cancer.  Colorectal cancer.  Skin cancer.  Lung cancer. What should I know about heart disease, diabetes, and high blood  pressure? Blood pressure and heart disease  High blood pressure causes heart disease and increases the risk of stroke. This is more likely to develop in people who have high blood pressure readings, are of African descent, or are overweight.  Have your blood pressure checked: ? Every 3-5 years if you are 18-39 years of age. ? Every year if you are 40 years old or older. Diabetes Have regular diabetes screenings. This checks your fasting blood sugar level. Have the screening done:  Once every three years after age 40 if you are at a normal weight and have a low risk for diabetes.  More often and at a younger age if you are overweight or have a high risk for diabetes. What should I know about preventing infection? Hepatitis B If you have a higher risk for hepatitis B, you should be screened for this virus. Talk with your health care provider to find out if you are at risk for hepatitis B infection. Hepatitis C Testing is recommended for:  Everyone born from 1945 through 1965.  Anyone with known risk factors for hepatitis C. Sexually transmitted infections (STIs)  Get screened for STIs, including gonorrhea and chlamydia, if: ? You are sexually active and are younger than 36 years of age. ? You are older than 36 years of age and your health care provider tells you that you are at risk for this type of infection. ? Your sexual activity has changed since you were last screened, and you are at increased risk for chlamydia or gonorrhea. Ask your health care provider   if you are at risk.  Ask your health care provider about whether you are at high risk for HIV. Your health care provider may recommend a prescription medicine to help prevent HIV infection. If you choose to take medicine to prevent HIV, you should first get tested for HIV. You should then be tested every 3 months for as long as you are taking the medicine. Pregnancy  If you are about to stop having your period (premenopausal) and  you may become pregnant, seek counseling before you get pregnant.  Take 400 to 800 micrograms (mcg) of folic acid every day if you become pregnant.  Ask for birth control (contraception) if you want to prevent pregnancy. Osteoporosis and menopause Osteoporosis is a disease in which the bones lose minerals and strength with aging. This can result in bone fractures. If you are 65 years old or older, or if you are at risk for osteoporosis and fractures, ask your health care provider if you should:  Be screened for bone loss.  Take a calcium or vitamin D supplement to lower your risk of fractures.  Be given hormone replacement therapy (HRT) to treat symptoms of menopause. Follow these instructions at home: Lifestyle  Do not use any products that contain nicotine or tobacco, such as cigarettes, e-cigarettes, and chewing tobacco. If you need help quitting, ask your health care provider.  Do not use street drugs.  Do not share needles.  Ask your health care provider for help if you need support or information about quitting drugs. Alcohol use  Do not drink alcohol if: ? Your health care provider tells you not to drink. ? You are pregnant, may be pregnant, or are planning to become pregnant.  If you drink alcohol: ? Limit how much you use to 0-1 drink a day. ? Limit intake if you are breastfeeding.  Be aware of how much alcohol is in your drink. In the [place] one drink equals one 12 oz bottle of beer (355 mL), one 5 oz glass of wine (148 mL), or one 1 oz glass of hard liquor (44 mL). General instructions  Schedule regular health, dental, and eye exams.  Stay current with your vaccines.  Tell your health care provider if: ? You often feel depressed. ? You have ever been abused or do not feel safe at home. Summary  Adopting a healthy lifestyle and getting preventive care are important in promoting health and wellness.  Follow your health care provider's instructions about healthy  diet, exercising, and getting tested or screened for diseases.  Follow your health care provider's instructions on monitoring your cholesterol and blood pressure. This information is not intended to replace advice given to you by your health care provider. Make sure you discuss any questions you have with your health care provider. Document Revised: 10/07/2018 Document Reviewed: 10/07/2018 Elsevier Patient Education  2021 [employer]vier Inc.  

## 2020-11-29 NOTE — Assessment & Plan Note (Signed)
Will start imitrex. Will consider more definitive treatment as exacerbated by OCP. Continue to monitor.

## 2020-11-29 NOTE — Assessment & Plan Note (Signed)
Under good control on current regimen. Continue current regimen. Continue to monitor. Call with any concerns. Refills given.   

## 2020-11-29 NOTE — Progress Notes (Signed)
BP 127/81   Pulse 87   Temp 98.8 F (37.1 C)   Ht 5' 2.5" (1.588 m)   Wt 174 lb 3.2 oz (79 kg)   SpO2 98%   BMI 31.35 kg/m    Subjective:    Patient ID: Karla Huber, female    DOB: June 16, 1986, 35 y.o.   MRN: [id number]  HPI: Karla Huber is a 35 y.o. female presenting on 11/29/2020 for comprehensive medical examination. Current medical complaints include: cough  She currently lives with: husband and kids Menopausal Symptoms: no  Depression Screen done today and results listed below:  [facility] 11/29/2020 11/12/2019 09/17/2018 08/29/2017 08/27/2016  Decreased Interest 0 0 0 0 0  Down, Depressed, Hopeless 0 0 0 0 0  PHQ - 2 Score 0 0 0 0 0  Altered sleeping - - 0 - -  Tired, decreased energy - - 2 - -  Change in appetite - - 0 - -  Feeling bad or failure about yourself  - - 0 - -  Trouble concentrating - - 1 - -  Moving slowly or fidgety/restless - - 0 - -  Suicidal thoughts - - 0 - -  PHQ-9 Score - - 3 - -  Difficult doing work/chores - - Not difficult at all - -    Past Medical History:  Past Medical History:  Diagnosis Date  . Acute blood loss anemia 11/13/2016  . Asthma    pulmocort daily  . GERD (gastroesophageal reflux disease)   . Gestational diabetes    diet controlled  . Headache   . Hx of varicella   . Hypertension    pre-eclampsia  . Iron deficiency anemia of pregnancy 11/13/2016  . 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 Menometrorrhagia 12/08/2017  . Postpartum care following cesarean delivery (4/30) 02/25/2015  . Postpartum care following repeat cesarean delivery (1/16) 11/13/2016  . Preeclampsia   . Traumatic injury during pregnancy in third trimester 02/09/2015    Surgical History:  Past Surgical History:  Procedure Laterality Date  . ADENOIDECTOMY    . CESAREAN SECTION N/A 02/25/2015   Procedure: CESAREAN SECTION;  Surgeon: Shea EvansVaishali Mody, MD;  Location: [facility];  Service: Obstetrics;  Laterality: N/A;  . CESAREAN SECTION N/A  11/12/2016   Procedure: Repeat CESAREAN SECTION;  Surgeon: Olivia Mackieichard Taavon, MD;  [facility];  Service: Obstetrics;  Laterality: N/A;  . FOOT SURGERY    . KNEE SURGERY    . TONSILLECTOMY      Medications:  Current Outpatient Medications on File Prior to Visit  Medication Sig  . cetirizine (ZYRTEC) 10 MG tablet Take 10 mg by mouth daily.  Marland Kitchen. FLOVENT HFA 110 MCG/ACT inhaler TAKE 2 PUFFS BY MOUTH TWICE A DAY  . norgestrel-ethinyl estradiol (LOW-OGESTREL) 0.3-30 MG-MCG tablet   . omeprazole (PRILOSEC OTC) 20 MG tablet Take 20 mg by mouth daily.  . SOOLANTRA 1 % CREA Apply topically.   No current facility-administered medications on file prior to visit.    Allergies:  Allergies  Allergen Reactions  . Hydrocodone-Acetaminophen Hives  . Oxycodone-Acetaminophen Nausea And Vomiting    Projectile vomiting  . Hydrocodone Hives  . Tdap [Tetanus-Diphth-Acell Pertussis] Other (See Comments)    Fever, Joint stiffness    Social History:  Social History   Socioeconomic History  . Marital status: Married    Spouse name: Not on file  . Number of children: Not on file  . Years of education: Not on file  . Highest education  level: Not on file  Occupational History  . Occupation: umemployed  Tobacco Use  . Smoking status: Never Smoker  . Smokeless tobacco: Never Used  Vaping Use  . Vaping Use: Never used  Substance and Sexual Activity  . Alcohol use: No  . Drug use: No  . Sexual activity: Yes    Birth control/protection: None  Other Topics Concern  . Not on file  Social History Narrative   Exercises try 3-5 times a week for 30-71min   Social Determinants of Health   Financial Resource Strain: Not on file  Food Insecurity: Not on file  Transportation Needs: Not on file  Physical Activity: Not on file  Stress: Not on file  Social Connections: Not on file  Intimate Partner Violence: Not on file   Social History   Tobacco Use  Smoking Status Never Smoker   Smokeless Tobacco Never Used   Social History   Substance and Sexual Activity  Alcohol Use No    Family History:  Family History  Problem Relation Age of Onset  . Heart disease Mother   . Mitral valve prolapse Father   . Asthma Sister   . Hypertension Maternal Grandmother   . Cancer Cousin        AML    Past medical history, surgical history, medications, allergies, family history and social history reviewed with patient today and changes made to appropriate areas of the chart.   Review of Systems  Constitutional: Negative.   HENT: Negative.   Eyes: Negative.   Respiratory: Positive for cough. Negative for hemoptysis, sputum production, shortness of breath and wheezing.   Cardiovascular: Negative.   Gastrointestinal: Negative.   Genitourinary: Negative.   Musculoskeletal: Negative.        Some R elbow pain  Skin: Negative.   Neurological: Positive for tingling and headaches (once a month with her periods- not getting better with excedrine). Negative for dizziness, tremors, sensory change, speech change, focal weakness, seizures, loss of consciousness and weakness.  Endo/Heme/Allergies: Negative.   Psychiatric/Behavioral: Negative.    All other ROS negative except what is listed above and in the HPI.      Objective:    BP 127/81   Pulse 87   Temp 98.8 F (37.1 C)   Ht 5' 2.5" (1.588 m)   Wt 174 lb 3.2 oz (79 kg)   SpO2 98%   BMI 31.35 kg/m   Wt Readings from Last 3 Encounters:  11/29/20 174 lb 3.2 oz (79 kg)  05/19/20 167 lb 9.6 oz (76 kg)  04/21/20 173 lb 9.6 oz (78.7 kg)    Physical Exam Vitals and nursing note reviewed.  Constitutional:      General: She is not in acute distress.    Appearance: Normal appearance. She is not ill-appearing, toxic-appearing or diaphoretic.  HENT:     Head: Normocephalic and atraumatic.     Right Ear: Tympanic membrane, ear canal and external ear normal. There is no impacted cerumen.     Left Ear: Tympanic membrane, ear  canal and external ear normal. There is no impacted cerumen.     Nose: Nose normal. No congestion or rhinorrhea.     Mouth/Throat:     Mouth: Mucous membranes are moist.     Pharynx: Oropharynx is clear. No oropharyngeal exudate or posterior oropharyngeal erythema.  Eyes:     General: No scleral icterus.       Right eye: No discharge.        Left eye: No  discharge.     Extraocular Movements: Extraocular movements intact.     Conjunctiva/sclera: Conjunctivae normal.     Pupils: Pupils are equal, round, and reactive to light.  Neck:     Vascular: No carotid bruit.  Cardiovascular:     Rate and Rhythm: Normal rate and regular rhythm.     Pulses: Normal pulses.     Heart sounds: No murmur heard. No friction rub. No gallop.   Pulmonary:     Effort: Pulmonary effort is normal. No respiratory distress.     Breath sounds: Normal breath sounds. No stridor. No wheezing, rhonchi or rales.  Chest:     Chest wall: No tenderness.  Abdominal:     General: Abdomen is flat. Bowel sounds are normal. There is no distension.     Palpations: Abdomen is soft. There is no mass.     Tenderness: There is no abdominal tenderness. There is no right CVA tenderness, left CVA tenderness, guarding or rebound.     Hernia: No hernia is present.  Genitourinary:    Comments: Breast and pelvic exams deferred with shared decision making Musculoskeletal:        General: No swelling, tenderness, deformity or signs of injury.     Cervical back: Normal range of motion and neck supple. No rigidity. No muscular tenderness.     Right lower leg: No edema.     Left lower leg: No edema.  Lymphadenopathy:     Cervical: No cervical adenopathy.  Skin:    General: Skin is warm and dry.     Capillary Refill: Capillary refill takes less than 2 seconds.     Coloration: Skin is not jaundiced or pale.     Findings: No bruising, erythema, lesion or rash.  Neurological:     General: No focal deficit present.     Mental Status:  She is alert and oriented to person, place, and time. Mental status is at baseline.     Cranial Nerves: No cranial nerve deficit.     Sensory: No sensory deficit.     Motor: No weakness.     Coordination: Coordination normal.     Gait: Gait normal.     Deep Tendon Reflexes: Reflexes normal.  Psychiatric:        Mood and Affect: Mood normal.        Behavior: Behavior normal.        Thought Content: Thought content normal.        Judgment: Judgment normal.     Results for orders placed or performed during the hospital encounter of 04/12/20  SARS Coronavirus 2 by RT PCR (hospital order, performed in [facility] hospital lab) Nasopharyngeal Nasopharyngeal Swab   Specimen: Nasopharyngeal Swab  Result Value Ref Range   SARS Coronavirus 2 NEGATIVE NEGATIVE  Urinalysis, Routine w reflex microscopic  Result Value Ref Range   Color, Urine YELLOW YELLOW   APPearance CLEAR CLEAR   Specific Gravity, Urine <1.005 (L) 1.005 - 1.030   pH 5.5 5.0 - 8.0   Glucose, UA NEGATIVE NEGATIVE mg/dL   Hgb urine dipstick LARGE (A) NEGATIVE   Bilirubin Urine NEGATIVE NEGATIVE   Ketones, ur NEGATIVE NEGATIVE mg/dL   Protein, ur NEGATIVE NEGATIVE mg/dL   Nitrite NEGATIVE NEGATIVE   Leukocytes,Ua NEGATIVE NEGATIVE  Pregnancy, urine  Result Value Ref Range   Preg Test, Ur NEGATIVE NEGATIVE  CBC with Differential  Result Value Ref Range   WBC 10.2 4.0 - 10.5 K/uL   RBC 4.58 3.87 -  5.11 MIL/uL   Hemoglobin 15.1 (H) 12.0 - 15.0 g/dL   HCT 25.9 56.3 - 87.5 %   MCV 95.0 80.0 - 100.0 fL   MCH 33.0 26.0 - 34.0 pg   MCHC 34.7 30.0 - 36.0 g/dL   RDW 64.3 32.9 - 51.8 %   Platelets 375 150 - 400 K/uL   nRBC 0.0 0.0 - 0.2 %   Neutrophils Relative % 77 %   Neutro Abs 7.9 (H) 1.7 - 7.7 K/uL   Lymphocytes Relative 17 %   Lymphs Abs 1.7 0.7 - 4.0 K/uL   Monocytes Relative 5 %   Monocytes Absolute 0.6 0.1 - 1.0 K/uL   Eosinophils Relative 0 %   Eosinophils Absolute 0.0 0.0 - 0.5 K/uL   Basophils Relative 1 %    Basophils Absolute 0.1 0.0 - 0.1 K/uL   Immature Granulocytes 0 %   Abs Immature Granulocytes 0.02 0.00 - 0.07 K/uL  Comprehensive metabolic panel  Result Value Ref Range   Sodium 136 135 - 145 mmol/L   Potassium 3.9 3.5 - 5.1 mmol/L   Chloride 102 98 - 111 mmol/L   CO2 22 22 - 32 mmol/L   Glucose, Bld 100 (H) 70 - 99 mg/dL   BUN 7 6 - 20 mg/dL   Creatinine, Ser 8.41 0.44 - 1.00 mg/dL   Calcium 9.6 8.9 - 66.0 mg/dL   Total Protein 8.1 6.5 - 8.1 g/dL   Albumin 4.5 3.5 - 5.0 g/dL   AST 17 15 - 41 U/L   ALT 28 0 - 44 U/L   Alkaline Phosphatase 59 38 - 126 U/L   Total Bilirubin 0.9 0.3 - 1.2 mg/dL   GFR calc non Af Amer >60 >60 mL/min   GFR calc Af Amer >60 >60 mL/min   Anion gap 12 5 - 15  Lipase, blood  Result Value Ref Range   Lipase 30 11 - 51 U/L  Urinalysis, Microscopic (reflex)  Result Value Ref Range   RBC / HPF 0-5 0 - 5 RBC/hpf   WBC, UA 0-5 0 - 5 WBC/hpf   Bacteria, UA RARE (A) NONE SEEN   Squamous Epithelial / LPF 0-5 0 - 5      Assessment & Plan:   Problem List Items Addressed This Visit      Cardiovascular and Mediastinum   Migraine    Will start imitrex. Will consider more definitive treatment as exacerbated by OCP. Continue to monitor.       Relevant Medications   SUMAtriptan (IMITREX) 100 MG tablet     Respiratory   Asthma    Under good control on current regimen. Continue current regimen. Continue to monitor. Call with any concerns. Refills given.        Relevant Medications   montelukast (SINGULAIR) 10 MG tablet   albuterol (VENTOLIN HFA) 108 (90 Base) MCG/ACT inhaler   predniSONE (DELTASONE) 50 MG tablet    Other Visit Diagnoses    Routine general medical examination at a health care facility    -  Primary   Vaccines up to date. Screening labs checked today. Pap through GYN up to date. Continue diet and exercise. Call with any concerns.    Relevant Orders   CBC with Differential/Platelet   Comprehensive metabolic panel   Lipid Panel w/o  Chol/HDL Ratio   Urinalysis, Routine w reflex microscopic   TSH       Follow up plan: Return in about 6 months (around 05/29/2021) for asthma and migraine.  LABORATORY TESTING:  - Pap smear: up to date  IMMUNIZATIONS:   - Tdap: Tetanus vaccination status reviewed: allergic. - Influenza: Up to date - COVID: Up to date  PATIENT COUNSELING:   Advised to take 1 mg of folate supplement per day if capable of pregnancy.   Sexuality: Discussed sexually transmitted diseases, partner selection, use of condoms, avoidance of unintended pregnancy  and contraceptive alternatives.   Advised to avoid cigarette smoking.  I discussed with the patient that most people either abstain from alcohol or drink within safe limits (<=14/week and <=4 drinks/occasion for males, <=7/weeks and <= 3 drinks/occasion for females) and that the risk for alcohol disorders and other health effects rises proportionally with the number of drinks per week and how often a drinker exceeds daily limits.  Discussed cessation/primary prevention of drug use and availability of treatment for abuse.   Diet: Encouraged to adjust caloric intake to maintain  or achieve ideal body weight, to reduce intake of dietary saturated fat and total fat, to limit sodium intake by avoiding high sodium foods and not adding table salt, and to maintain adequate dietary potassium and calcium preferably from fresh fruits, vegetables, and low-fat dairy products.    stressed the importance of regular exercise  Injury prevention: Discussed safety belts, safety helmets, smoke detector, smoking near bedding or upholstery.   Dental health: Discussed importance of regular tooth brushing, flossing, and dental visits.    NEXT PREVENTATIVE PHYSICAL DUE IN 1 YEAR. Return in about 6 months (around 05/29/2021) for asthma and migraine.

## 2020-11-30 LAB — CBC WITH DIFFERENTIAL/PLATELET
Basophils Absolute: 0 10*3/uL (ref 0.0–0.2)
Basos: 1 %
EOS (ABSOLUTE): 0.1 10*3/uL (ref 0.0–0.4)
Eos: 1 %
Hematocrit: 44.9 % (ref 34.0–46.6)
Hemoglobin: 14.9 g/dL (ref 11.1–15.9)
Immature Grans (Abs): 0 10*3/uL (ref 0.0–0.1)
Immature Granulocytes: 0 %
Lymphocytes Absolute: 1.8 10*3/uL (ref 0.7–3.1)
Lymphs: 21 %
MCH: 32.1 pg (ref 26.6–33.0)
MCHC: 33.2 g/dL (ref 31.5–35.7)
MCV: 97 fL (ref 79–97)
Monocytes Absolute: 0.4 10*3/uL (ref 0.1–0.9)
Monocytes: 5 %
Neutrophils Absolute: 6.1 10*3/uL (ref 1.4–7.0)
Neutrophils: 72 %
Platelets: 351 10*3/uL (ref 150–450)
RBC: 4.64 x10E6/uL (ref 3.77–5.28)
RDW: 11.9 % (ref 11.7–15.4)
WBC: 8.5 10*3/uL (ref 3.4–10.8)

## 2020-11-30 LAB — COMPREHENSIVE METABOLIC PANEL
ALT: 27 IU/L (ref 0–32)
AST: 17 IU/L (ref 0–40)
Albumin/Globulin Ratio: 1.9 (ref 1.2–2.2)
Albumin: 4.8 g/dL (ref 3.8–4.8)
Alkaline Phosphatase: 66 IU/L (ref 44–121)
BUN/Creatinine Ratio: 10 (ref 9–23)
BUN: 9 mg/dL (ref 6–20)
Bilirubin Total: 0.4 mg/dL (ref 0.0–1.2)
CO2: 22 mmol/L (ref 20–29)
Calcium: 10.2 mg/dL (ref 8.7–10.2)
Chloride: 103 mmol/L (ref 96–106)
Creatinine, Ser: 0.86 mg/dL (ref 0.57–1.00)
GFR calc Af Amer: 101 mL/min/{1.73_m2} (ref >59)
GFR calc non Af Amer: 88 mL/min/{1.73_m2} (ref >59)
Globulin, Total: 2.5 g/dL (ref 1.5–4.5)
Glucose: 93 mg/dL (ref 65–99)
Potassium: 4.4 mmol/L (ref 3.5–5.2)
Sodium: 140 mmol/L (ref 134–144)
Total Protein: 7.3 g/dL (ref 6.0–8.5)

## 2020-11-30 LAB — TSH: TSH: 1.2 u[IU]/mL (ref 0.450–4.500)

## 2020-11-30 LAB — LIPID PANEL W/O CHOL/HDL RATIO
Cholesterol, Total: 197 mg/dL (ref 100–199)
HDL: 53 mg/dL (ref >39)
LDL Chol Calc (NIH): 120 mg/dL — ABNORMAL HIGH (ref 0–99)
Triglycerides: 137 mg/dL (ref 0–149)
VLDL Cholesterol Cal: 24 mg/dL (ref 5–40)

## 2020-12-28 ENCOUNTER — Other Ambulatory Visit: Payer: Self-pay | Admitting: Family Medicine

## 2021-01-08 ENCOUNTER — Encounter: Payer: Self-pay | Admitting: Family Medicine

## 2021-01-19 ENCOUNTER — Ambulatory Visit (HOSPITAL_BASED_OUTPATIENT_CLINIC_OR_DEPARTMENT_OTHER)
Admission: RE | Admit: 2021-01-19 | Discharge: 2021-01-19 | Disposition: A | Discharge status: Home / Self Care | Payer: BC Managed Care – PPO | Source: Ambulatory Visit | Attending: Nurse Practitioner | Admitting: Nurse Practitioner

## 2021-01-19 ENCOUNTER — Other Ambulatory Visit: Payer: Self-pay

## 2021-01-19 ENCOUNTER — Ambulatory Visit (INDEPENDENT_AMBULATORY_CARE_PROVIDER_SITE_OTHER): Payer: BC Managed Care – PPO | Admitting: Nurse Practitioner

## 2021-01-19 ENCOUNTER — Encounter: Payer: Self-pay | Admitting: Nurse Practitioner

## 2021-01-19 VITALS — BP 137/81 | HR 98 | Temp 98.6°F | Wt 173.8 lb

## 2021-01-19 DIAGNOSIS — J4520 Mild intermittent asthma, uncomplicated: Secondary | ICD-10-CM

## 2021-01-19 DIAGNOSIS — J45909 Unspecified asthma, uncomplicated: Secondary | ICD-10-CM | POA: Diagnosis not present

## 2021-01-19 DIAGNOSIS — R059 Cough, unspecified: Secondary | ICD-10-CM | POA: Diagnosis not present

## 2021-01-19 MED ORDER — FLUTICASONE-SALMETEROL 115-21 MCG/ACT IN AERO: 2.0000 | INHALATION_SPRAY | Freq: Two times a day (BID) | RESPIRATORY_TRACT | 12 refills | Status: DC

## 2021-01-19 MED ORDER — PREDNISONE 20 MG PO TABS: 40.0000 mg | ORAL_TABLET | Freq: Every day | ORAL | 0 refills | Status: AC

## 2021-01-19 NOTE — Patient Instructions (Addendum)
Asthma, Adult  Asthma is a long-term (chronic) condition in which the airways get tight and narrow. The airways are the breathing passages that lead from the nose and mouth down into the lungs. A person with asthma will have times when symptoms get worse. These are called asthma attacks. They can cause coughing, whistling sounds when you breathe (wheezing), shortness of breath, and chest pain. They can make it hard to breathe. There is no cure for asthma, but medicines and lifestyle changes can help control it. There are many things that can bring on an asthma attack or make asthma symptoms worse (triggers). Common triggers include:  Mold.  Dust.  Cigarette smoke.  Cockroaches.  Things that can cause allergy symptoms (allergens). These include animal skin flakes (dander) and pollen from trees or grass.  Things that pollute the air. These may include household cleaners, wood smoke, smog, or chemical odors.  Cold air, weather changes, and wind.  Crying or laughing hard.  Stress.  Certain medicines or drugs.  Certain foods such as dried fruit, potato chips, and grape juice.  Infections, such as a cold or the flu.  Certain medical conditions or diseases.  Exercise or tiring activities. Asthma may be treated with medicines and by staying away from the things that cause asthma attacks. Types of medicines may include:  Controller medicines. These help prevent asthma symptoms. They are usually taken every day.  Fast-acting reliever or rescue medicines. These quickly relieve asthma symptoms. They are used as needed and provide short-term relief.  Allergy medicines if your attacks are brought on by allergens.  Medicines to help control the body's defense (immune) system. Follow these instructions at home: Avoiding triggers in your home  Change your heating and air conditioning filter often.  Limit your use of fireplaces and wood stoves.  Get rid of pests (such as roaches and  mice) and their droppings.  Throw away plants if you see mold on them.  Clean your floors. Dust regularly. Use cleaning products that do not smell.  Have someone vacuum when you are not home. Use a vacuum cleaner with a HEPA filter if possible.  Replace carpet with wood, tile, or vinyl flooring. Carpet can trap animal skin flakes and dust.  Use allergy-proof pillows, mattress covers, and box spring covers.  Wash bed sheets and blankets every week in hot water. Dry them in a dryer.  Keep your bedroom free of any triggers.  Avoid pets and keep windows closed when things that cause allergy symptoms are in the air.  Use blankets that are made of polyester or cotton.  Clean bathrooms and kitchens with bleach. If possible, have someone repaint the walls in these rooms with mold-resistant paint. Keep out of the rooms that are being cleaned and painted.  Wash your hands often with soap and water. If soap and water are not available, use hand sanitizer.  Do not allow anyone to smoke in your home. General instructions  Take over-the-counter and prescription medicines only as told by your doctor. ? Talk with your doctor if you have questions about how or when to take your medicines. ? Make note if you need to use your medicines more often than usual.  Do not use any products that contain nicotine or tobacco, such as cigarettes and e-cigarettes. If you need help quitting, ask your doctor.  Stay away from secondhand smoke.  Avoid doing things outdoors when allergen counts are high and when air quality is low.  Wear a ski mask   when doing outdoor activities in the winter. The mask should cover your nose and mouth. Exercise indoors on cold days if you can.  Warm up before you exercise. Take time to cool down after exercise.  Use a peak flow meter as told by your doctor. A peak flow meter is a tool that measures how well the lungs are working.  Keep track of the peak flow meter's readings.  Write them down.  Follow your asthma action plan. This is a written plan for taking care of your asthma and treating your attacks.  Make sure you get all the shots (vaccines) that your doctor recommends. Ask your doctor about a flu shot and a pneumonia shot.  Keep all follow-up visits as told by your doctor. This is important. Contact a doctor if:  You have wheezing, shortness of breath, or a cough even while taking medicine to prevent attacks.  The mucus you cough up (sputum) is thicker than usual.  The mucus you cough up changes from clear or white to yellow, green, gray, or bloody.  You have problems from the medicine you are taking, such as: ? A rash. ? Itching. ? Swelling. ? Trouble breathing.  You need reliever medicines more than 2-3 times a week.  Your peak flow reading is still at 50-79% of your personal best after following the action plan for 1 hour.  You have a fever. Get help right away if:  You seem to be worse and are not responding to medicine during an asthma attack.  You are short of breath even at rest.  You get short of breath when doing very little activity.  You have trouble eating, drinking, or talking.  You have chest pain or tightness.  You have a fast heartbeat.  Your lips or fingernails start to turn blue.  You are light-headed or dizzy, or you faint.  Your peak flow is less than 50% of your personal best.  You feel too tired to breathe normally. Summary  Asthma is a long-term (chronic) condition in which the airways get tight and narrow. An asthma attack can make it hard to breathe.  Asthma cannot be cured, but medicines and lifestyle changes can help control it.  Make sure you understand how to avoid triggers and how and when to use your medicines. This information is not intended to replace advice given to you by your health care provider. Make sure you discuss any questions you have with your health care provider. Document Revised:  02/16/2020 Document Reviewed: 02/16/2020 Elsevier Patient Education  2021 [employer]  Asthma, Adult  Asthma is a long-term (chronic) condition in which the airways get tight and narrow. The airways are the breathing passages that lead from the nose and mouth down into the lungs. A person with asthma will have times when symptoms get worse. These are called asthma attacks. They can cause coughing, whistling sounds when you breathe (wheezing), shortness of breath, and chest pain. They can make it hard to breathe. There is no cure for asthma, but medicines and lifestyle changes can help control it. There are many things that can bring on an asthma attack or make asthma symptoms worse (triggers). Common triggers include:  Mold.  Dust.  Cigarette smoke.  Cockroaches.  Things that can cause allergy symptoms (allergens). These include animal skin flakes (dander) and pollen from trees or grass.  Things that pollute the air. These may include household cleaners, wood smoke, smog, or chemical odors.  Cold air, weather changes,   and wind.  Crying or laughing hard.  Stress.  Certain medicines or drugs.  Certain foods such as dried fruit, potato chips, and grape juice.  Infections, such as a cold or the flu.  Certain medical conditions or diseases.  Exercise or tiring activities. Asthma may be treated with medicines and by staying away from the things that cause asthma attacks. Types of medicines may include:  Controller medicines. These help prevent asthma symptoms. They are usually taken every day.  Fast-acting reliever or rescue medicines. These quickly relieve asthma symptoms. They are used as needed and provide short-term relief.  Allergy medicines if your attacks are brought on by allergens.  Medicines to help control the body's defense (immune) system. Follow these instructions at home: Avoiding triggers in your home  Change your heating and air conditioning filter  often.  Limit your use of fireplaces and wood stoves.  Get rid of pests (such as roaches and mice) and their droppings.  Throw away plants if you see mold on them.  Clean your floors. Dust regularly. Use cleaning products that do not smell.  Have someone vacuum when you are not home. Use a vacuum cleaner with a HEPA filter if possible.  Replace carpet with wood, tile, or vinyl flooring. Carpet can trap animal skin flakes and dust.  Use allergy-proof pillows, mattress covers, and box spring covers.  Wash bed sheets and blankets every week in hot water. Dry them in a dryer.  Keep your bedroom free of any triggers.  Avoid pets and keep windows closed when things that cause allergy symptoms are in the air.  Use blankets that are made of polyester or cotton.  Clean bathrooms and kitchens with bleach. If possible, have someone repaint the walls in these rooms with mold-resistant paint. Keep out of the rooms that are being cleaned and painted.  Wash your hands often with soap and water. If soap and water are not available, use hand sanitizer.  Do not allow anyone to smoke in your home. General instructions  Take over-the-counter and prescription medicines only as told by your doctor. ? Talk with your doctor if you have questions about how or when to take your medicines. ? Make note if you need to use your medicines more often than usual.  Do not use any products that contain nicotine or tobacco, such as cigarettes and e-cigarettes. If you need help quitting, ask your doctor.  Stay away from secondhand smoke.  Avoid doing things outdoors when allergen counts are high and when air quality is low.  Wear a ski mask when doing outdoor activities in the winter. The mask should cover your nose and mouth. Exercise indoors on cold days if you can.  Warm up before you exercise. Take time to cool down after exercise.  Use a peak flow meter as told by your doctor. A peak flow meter is a  tool that measures how well the lungs are working.  Keep track of the peak flow meter's readings. Write them down.  Follow your asthma action plan. This is a written plan for taking care of your asthma and treating your attacks.  Make sure you get all the shots (vaccines) that your doctor recommends. Ask your doctor about a flu shot and a pneumonia shot.  Keep all follow-up visits as told by your doctor. This is important. Contact a doctor if:  You have wheezing, shortness of breath, or a cough even while taking medicine to prevent attacks.  The mucus you cough  up (sputum) is thicker than usual.  The mucus you cough up changes from clear or white to yellow, green, gray, or bloody.  You have problems from the medicine you are taking, such as: ? A rash. ? Itching. ? Swelling. ? Trouble breathing.  You need reliever medicines more than 2-3 times a week.  Your peak flow reading is still at 50-79% of your personal best after following the action plan for 1 hour.  You have a fever. Get help right away if:  You seem to be worse and are not responding to medicine during an asthma attack.  You are short of breath even at rest.  You get short of breath when doing very little activity.  You have trouble eating, drinking, or talking.  You have chest pain or tightness.  You have a fast heartbeat.  Your lips or fingernails start to turn blue.  You are light-headed or dizzy, or you faint.  Your peak flow is less than 50% of your personal best.  You feel too tired to breathe normally. Summary  Asthma is a long-term (chronic) condition in which the airways get tight and narrow. An asthma attack can make it hard to breathe.  Asthma cannot be cured, but medicines and lifestyle changes can help control it.  Make sure you understand how to avoid triggers and how and when to use your medicines. This information is not intended to replace advice given to you by your health care  provider. Make sure you discuss any questions you have with your health care provider. Document Revised: 02/16/2020 Document Reviewed: 02/16/2020 Elsevier Patient Education  2021 [employer].

## 2021-01-19 NOTE — Progress Notes (Signed)
BP 137/81   Pulse 98   Temp 98.6 F (37 C) (Oral)   Wt 173 lb 12.8 oz (78.8 kg)   SpO2 99%   BMI 31.28 kg/m    Subjective:    Patient ID: Karla Huber, female    DOB: 24-Aug-1986, 35 y.o.   MRN: [id number]  HPI: Karla Huber is a 35 y.o. female  Chief Complaint  Patient presents with  . Cough    Patient states she was here for her physical back in February for physical and was prescribed Prednisone for 5 days. Patient states it helped her towards the end of prescription and then after the course of treatment was completed she went back to horrible to coughing. Patient complains of nasty and dry coughs.   . Asthma   ASTHMA At physical on 11/29/20 was given 5 days of Prednisone for a cough that was present, which she reports helped while taking, but cough returned when completed.  Currently uses Flovent daily + Albuterol as needed.  Is taking Singulair daily and Zyrtec. FVC 108% and FEV1 109% on 05/23/2016.  Today FVC 94%, FEV1 97%, and FEV1/FVC 103%.  Her current cough started in December, at that time her family "had the funk" and there was a positive case Covid in her daughter's classroom.  They did not test for Covid at that time.  Was age 65 when diagnosed with asthma.  In past did take Symbicort.   Asthma status: stable Satisfied with current treatment?: no Albuterol/rescue inhaler frequency: once a day Dyspnea frequency: none -- with deep breath does need to cough Wheezing frequency: none Cough frequency: frequent since viral infection in December Nocturnal symptom frequency: daily Limitation of activity: no Current upper respiratory symptoms: no Triggers: pollen and recent infection Home peak flows: none Last Spirometry: today Failed/intolerant to following asthma meds: none Asthma meds in past: Symbicort, Advair Aerochamber/spacer use: no Visits to ER or Urgent Care in past year: no Pneumovax: received in 2009 at [facility] Influenza: Up to Date  Relevant past  medical, surgical, family and social history reviewed and updated as indicated. Interim medical history since our last visit reviewed. Allergies and medications reviewed and updated.  Review of Systems  Constitutional: Negative.   Respiratory: Positive for cough and chest tightness. Negative for shortness of breath and wheezing.   Cardiovascular: Negative.   Neurological: Negative.   Psychiatric/Behavioral: Negative.     Per HPI unless specifically indicated above     Objective:    BP 137/81   Pulse 98   Temp 98.6 F (37 C) (Oral)   Wt 173 lb 12.8 oz (78.8 kg)   SpO2 99%   BMI 31.28 kg/m   Wt Readings from Last 3 Encounters:  01/19/21 173 lb 12.8 oz (78.8 kg)  11/29/20 174 lb 3.2 oz (79 kg)  05/19/20 167 lb 9.6 oz (76 kg)    Physical Exam Vitals and nursing note reviewed.  Constitutional:      General: She is awake. She is not in acute distress.    Appearance: She is well-developed and well-groomed. She is obese. She is not ill-appearing.  HENT:     Head: Normocephalic.     Right Ear: Hearing normal.     Left Ear: Hearing normal.  Eyes:     General: Lids are normal.        Right eye: No discharge.        Left eye: No discharge.     Conjunctiva/sclera: Conjunctivae normal.  Pupils: Pupils are equal, round, and reactive to light.  Neck:     Thyroid: No thyromegaly.     Vascular: No carotid bruit.  Cardiovascular:     Rate and Rhythm: Normal rate and regular rhythm.     Heart sounds: Normal heart sounds. No murmur heard. No gallop.   Pulmonary:     Effort: Pulmonary effort is normal. No accessory muscle usage or respiratory distress.     Breath sounds: Wheezing present. No decreased breath sounds.     Comments: Expiratory wheezes noted throughout, clear with cough. Abdominal:     General: Bowel sounds are normal.     Palpations: Abdomen is soft. There is no hepatomegaly or splenomegaly.  Musculoskeletal:     Cervical back: Normal range of motion and neck  supple.     Right lower leg: No edema.     Left lower leg: No edema.  Lymphadenopathy:     Cervical: No cervical adenopathy.  Skin:    General: Skin is warm and dry.  Neurological:     Mental Status: She is alert and oriented to person, place, and time.  Psychiatric:        Attention and Perception: Attention normal.        Mood and Affect: Mood normal.        Speech: Speech normal.        Behavior: Behavior normal. Behavior is cooperative.        Thought Content: Thought content normal.    Results for orders placed or performed in visit on 11/29/20  CBC with Differential/Platelet  Result Value Ref Range   WBC 8.5 3.4 - 10.8 x10E3/uL   RBC 4.64 3.77 - 5.28 x10E6/uL   Hemoglobin 14.9 11.1 - 15.9 g/dL   Hematocrit 12.1 97.5 - 46.6 %   MCV 97 79 - 97 fL   MCH 32.1 26.6 - 33.0 pg   MCHC 33.2 31.5 - 35.7 g/dL   RDW 88.3 25.4 - 98.2 %   Platelets 351 150 - 450 x10E3/uL   Neutrophils 72 Not Estab. %   Lymphs 21 Not Estab. %   Monocytes 5 Not Estab. %   Eos 1 Not Estab. %   Basos 1 Not Estab. %   Neutrophils Absolute 6.1 1.4 - 7.0 x10E3/uL   Lymphocytes Absolute 1.8 0.7 - 3.1 x10E3/uL   Monocytes Absolute 0.4 0.1 - 0.9 x10E3/uL   EOS (ABSOLUTE) 0.1 0.0 - 0.4 x10E3/uL   Basophils Absolute 0.0 0.0 - 0.2 x10E3/uL   Immature Granulocytes 0 Not Estab. %   Immature Grans (Abs) 0.0 0.0 - 0.1 x10E3/uL  Comprehensive metabolic panel  Result Value Ref Range   Glucose 93 65 - 99 mg/dL   BUN 9 6 - 20 mg/dL   Creatinine, Ser 6.41 0.57 - 1.00 mg/dL   GFR calc non Af Amer 88 >59 mL/min/1.73   GFR calc Af Amer 101 >59 mL/min/1.73   BUN/Creatinine Ratio 10 9 - 23   Sodium 140 134 - 144 mmol/L   Potassium 4.4 3.5 - 5.2 mmol/L   Chloride 103 96 - 106 mmol/L   CO2 22 20 - 29 mmol/L   Calcium 10.2 8.7 - 10.2 mg/dL   Total Protein 7.3 6.0 - 8.5 g/dL   Albumin 4.8 3.8 - 4.8 g/dL   Globulin, Total 2.5 1.5 - 4.5 g/dL   Albumin/Globulin Ratio 1.9 1.2 - 2.2   Bilirubin Total 0.4 0.0 - 1.2  mg/dL   Alkaline Phosphatase 66 44 - 121  IU/L   AST 17 0 - 40 IU/L   ALT 27 0 - 32 IU/L  Lipid Panel w/o Chol/HDL Ratio  Result Value Ref Range   Cholesterol, Total 197 100 - 199 mg/dL   Triglycerides 767 0 - 149 mg/dL   HDL 53 >20 mg/dL   VLDL Cholesterol Cal 24 5 - 40 mg/dL   LDL Chol Calc (NIH) 947 (H) 0 - 99 mg/dL  Urinalysis, Routine w reflex microscopic  Result Value Ref Range   Specific Gravity, UA <1.005 (L) 1.005 - 1.030   pH, UA 5.5 5.0 - 7.5   Color, UA Yellow Yellow   Appearance Ur Clear Clear   Leukocytes,UA Negative Negative   Protein,UA Negative Negative/Trace   Glucose, UA Negative Negative   Ketones, UA Negative Negative   RBC, UA Negative Negative   Bilirubin, UA Negative Negative   Urobilinogen, Ur 0.2 0.2 - 1.0 mg/dL   Nitrite, UA Negative Negative  TSH  Result Value Ref Range   TSH 1.200 0.450 - 4.500 uIU/mL      Assessment & Plan:   Problem List Items Addressed This Visit      Respiratory   Asthma - Primary    Chronic, ongoing with continued cough since viral illness in December, ?Covid.  Today FVC 94%, FEV1 97%, and FEV1/FVC 103%.  At this time will stop Flovent and start Advair HFA for both the LABA and ICS coverage, discussed at length with patient.  She has taken this in past and tolerated well.  To rinse mouth out well after each use.  Continue Albuterol as needed.  Will send in Prednisone 40 MG x 5 days.  Order for CXR placed.  Return in 6 weeks for follow-up with PCP.      Relevant Medications   fluticasone-salmeterol (ADVAIR HFA) 115-21 MCG/ACT inhaler   predniSONE (DELTASONE) 20 MG tablet   Other Relevant Orders   Spirometry with graph (Completed)   DG Chest 2 View       Follow up plan: Return in about 6 weeks (around 03/02/2021) for Asthma with PCP.

## 2021-01-19 NOTE — Assessment & Plan Note (Signed)
Chronic, ongoing with continued cough since viral illness in December, ?Covid.  Today FVC 94%, FEV1 97%, and FEV1/FVC 103%.  At this time will stop Flovent and start Advair HFA for both the LABA and ICS coverage, discussed at length with patient.  She has taken this in past and tolerated well.  To rinse mouth out well after each use.  Continue Albuterol as needed.  Will send in Prednisone 40 MG x 5 days.  Order for CXR placed.  Return in 6 weeks for follow-up with PCP.

## 2021-01-20 NOTE — Progress Notes (Signed)
Contacted via MyChart   Good afternoon Karla Huber, your chest x-ray returned and no pneumonia noted on imaging and on review of report.  Overall normal imaging.  This is good news!!  Continue current medication changes as were sent in.:) Keep being awesome!!  Thank you for allowing me to participate in your care. Kindest regards, 

## 2021-01-25 ENCOUNTER — Encounter: Payer: Self-pay | Admitting: Family Medicine

## 2021-01-28 MED ORDER — BUDESONIDE-FORMOTEROL FUMARATE 160-4.5 MCG/ACT IN AERO: 2.0000 | INHALATION_SPRAY | Freq: Two times a day (BID) | RESPIRATORY_TRACT | 3 refills | Status: DC

## 2021-01-29 ENCOUNTER — Telehealth: Payer: Self-pay

## 2021-01-29 NOTE — Telephone Encounter (Signed)
PA for generic symbicort initiated and submitted via Cover My Meds. Key: [id number]

## 2021-02-01 NOTE — Telephone Encounter (Signed)
Patient notified. See mychart message.

## 2021-03-02 ENCOUNTER — Encounter: Payer: Self-pay | Admitting: Family Medicine

## 2021-03-02 ENCOUNTER — Ambulatory Visit (INDEPENDENT_AMBULATORY_CARE_PROVIDER_SITE_OTHER): Payer: BC Managed Care – PPO | Admitting: Family Medicine

## 2021-03-02 ENCOUNTER — Other Ambulatory Visit: Payer: Self-pay

## 2021-03-02 VITALS — BP 117/82 | HR 103 | Temp 99.0°F | Wt 172.4 lb

## 2021-03-02 DIAGNOSIS — J4520 Mild intermittent asthma, uncomplicated: Secondary | ICD-10-CM | POA: Diagnosis not present

## 2021-03-02 DIAGNOSIS — J0100 Acute maxillary sinusitis, unspecified: Secondary | ICD-10-CM | POA: Diagnosis not present

## 2021-03-02 MED ORDER — AMOXICILLIN-POT CLAVULANATE 875-125 MG PO TABS: 1.0000 | ORAL_TABLET | Freq: Two times a day (BID) | ORAL | 0 refills | Status: DC

## 2021-03-02 MED ORDER — BUDESONIDE-FORMOTEROL FUMARATE 160-4.5 MCG/ACT IN AERO: 2.0000 | INHALATION_SPRAY | Freq: Two times a day (BID) | RESPIRATORY_TRACT | 3 refills | Status: DC

## 2021-03-02 MED ORDER — FLUCONAZOLE 150 MG PO TABS: 150.0000 mg | ORAL_TABLET | Freq: Every day | ORAL | 0 refills | Status: DC

## 2021-03-02 NOTE — Assessment & Plan Note (Signed)
Under good control on current regimen. Continue current regimen. Continue to monitor. Call with any concerns. Refills given.   

## 2021-03-02 NOTE — Progress Notes (Signed)
BP 117/82   Pulse (!) 103   Temp 99 F (37.2 C)   Wt 172 lb 6.4 oz (78.2 kg)   SpO2 99%   BMI 31.03 kg/m    Subjective:    Patient ID: Karla Huber, female    DOB: February 05, 1986, 35 y.o.   MRN: [id number]  HPI: [name] is a 35 y.o. female  Chief Complaint  Patient presents with  . Asthma  . Sinusitis    Patient states for about 2 weeks she has sinus pressure. Nasal congestion.    ASTHMA Asthma status: controlled Satisfied with current treatment?: yes Albuterol/rescue inhaler frequency: very rarely Dyspnea frequency: very rarely Wheezing frequency: very rarely Cough frequency: never Nocturnal symptom frequency: never Limitation of activity: no Current upper respiratory symptoms: no Aerochamber/spacer use: no Visits to ER or Urgent Care in past year: no Pneumovax: Up to Date Influenza: Up to Date  UPPER RESPIRATORY TRACT INFECTION Duration: 3 weeks Worst symptom: congestion Fever: no Cough: no Shortness of breath: no Wheezing: no Chest pain: no Chest tightness: no Chest congestion: no Nasal congestion: yes Runny nose: yes Post nasal drip: yes Sneezing: no Sore throat: no Swollen glands: no Sinus pressure: yes Headache: yes Face pain: yes Toothache: yes Ear pain: yes  Ear pressure: yes  Eyes red/itching:no Eye drainage/crusting: no  Vomiting: no Rash: no Fatigue: no Sick contacts: no Strep contacts: no  Context: stable Recurrent sinusitis: no Relief with OTC cold/cough medications: no  Treatments attempted: cold/sinus and mucinex   Relevant past medical, surgical, family and social history reviewed and updated as indicated. Interim medical history since our last visit reviewed. Allergies and medications reviewed and updated.  Review of Systems  Constitutional: Negative.   HENT: Positive for congestion, ear pain, postnasal drip, rhinorrhea and sinus pressure. Negative for dental problem, drooling, ear discharge, facial swelling,  hearing loss, mouth sores, nosebleeds, sinus pain, sneezing, sore throat, tinnitus, trouble swallowing and voice change.   Respiratory: Negative.   Cardiovascular: Negative.   Gastrointestinal: Negative.   Psychiatric/Behavioral: Negative.     Per HPI unless specifically indicated above     Objective:    BP 117/82   Pulse (!) 103   Temp 99 F (37.2 C)   Wt 172 lb 6.4 oz (78.2 kg)   SpO2 99%   BMI 31.03 kg/m   Wt Readings from Last 3 Encounters:  03/02/21 172 lb 6.4 oz (78.2 kg)  01/19/21 173 lb 12.8 oz (78.8 kg)  11/29/20 174 lb 3.2 oz (79 kg)    Physical Exam Vitals and nursing note reviewed.  Constitutional:      General: She is not in acute distress.    Appearance: Normal appearance. She is not ill-appearing, toxic-appearing or diaphoretic.  HENT:     Head: Normocephalic and atraumatic.     Right Ear: Tympanic membrane, ear canal and external ear normal. There is no impacted cerumen.     Left Ear: Tympanic membrane, ear canal and external ear normal. There is no impacted cerumen.     Nose: Congestion and rhinorrhea present.     Mouth/Throat:     Mouth: Mucous membranes are moist.     Pharynx: Oropharynx is clear. No oropharyngeal exudate or posterior oropharyngeal erythema.  Eyes:     General: No scleral icterus.       Right eye: No discharge.        Left eye: No discharge.     Extraocular Movements: Extraocular movements intact.     Conjunctiva/sclera: Conjunctivae  normal.     Pupils: Pupils are equal, round, and reactive to light.  Neck:     Vascular: No carotid bruit.  Cardiovascular:     Rate and Rhythm: Normal rate and regular rhythm.     Pulses: Normal pulses.     Heart sounds: Normal heart sounds. No murmur heard. No friction rub. No gallop.   Pulmonary:     Effort: Pulmonary effort is normal. No respiratory distress.     Breath sounds: Normal breath sounds. No stridor. No wheezing, rhonchi or rales.  Chest:     Chest wall: No tenderness.   Musculoskeletal:        General: Normal range of motion.     Cervical back: Normal range of motion and neck supple. No rigidity. No muscular tenderness.  Lymphadenopathy:     Cervical: No cervical adenopathy.  Skin:    General: Skin is warm and dry.     Capillary Refill: Capillary refill takes less than 2 seconds.     Coloration: Skin is not jaundiced or pale.     Findings: No bruising, erythema, lesion or rash.  Neurological:     General: No focal deficit present.     Mental Status: She is alert and oriented to person, place, and time. Mental status is at baseline.     Cranial Nerves: No cranial nerve deficit.     Sensory: No sensory deficit.     Motor: No weakness.     Coordination: Coordination normal.     Gait: Gait normal.     Deep Tendon Reflexes: Reflexes normal.  Psychiatric:        Mood and Affect: Mood normal.        Behavior: Behavior normal.        Thought Content: Thought content normal.        Judgment: Judgment normal.     Results for orders placed or performed in visit on 11/29/20  CBC with Differential/Platelet  Result Value Ref Range   WBC 8.5 3.4 - 10.8 x10E3/uL   RBC 4.64 3.77 - 5.28 x10E6/uL   Hemoglobin 14.9 11.1 - 15.9 g/dL   Hematocrit 25.4 27.0 - 46.6 %   MCV 97 79 - 97 fL   MCH 32.1 26.6 - 33.0 pg   MCHC 33.2 31.5 - 35.7 g/dL   RDW 62.3 76.2 - 83.1 %   Platelets 351 150 - 450 x10E3/uL   Neutrophils 72 Not Estab. %   Lymphs 21 Not Estab. %   Monocytes 5 Not Estab. %   Eos 1 Not Estab. %   Basos 1 Not Estab. %   Neutrophils Absolute 6.1 1.4 - 7.0 x10E3/uL   Lymphocytes Absolute 1.8 0.7 - 3.1 x10E3/uL   Monocytes Absolute 0.4 0.1 - 0.9 x10E3/uL   EOS (ABSOLUTE) 0.1 0.0 - 0.4 x10E3/uL   Basophils Absolute 0.0 0.0 - 0.2 x10E3/uL   Immature Granulocytes 0 Not Estab. %   Immature Grans (Abs) 0.0 0.0 - 0.1 x10E3/uL  Comprehensive metabolic panel  Result Value Ref Range   Glucose 93 65 - 99 mg/dL   BUN 9 6 - 20 mg/dL   Creatinine, Ser 5.17  0.57 - 1.00 mg/dL   GFR calc non Af Amer 88 >59 mL/min/1.73   GFR calc Af Amer 101 >59 mL/min/1.73   BUN/Creatinine Ratio 10 9 - 23   Sodium 140 134 - 144 mmol/L   Potassium 4.4 3.5 - 5.2 mmol/L   Chloride 103 96 - 106 mmol/L   CO2  22 20 - 29 mmol/L   Calcium 10.2 8.7 - 10.2 mg/dL   Total Protein 7.3 6.0 - 8.5 g/dL   Albumin 4.8 3.8 - 4.8 g/dL   Globulin, Total 2.5 1.5 - 4.5 g/dL   Albumin/Globulin Ratio 1.9 1.2 - 2.2   Bilirubin Total 0.4 0.0 - 1.2 mg/dL   Alkaline Phosphatase 66 44 - 121 IU/L   AST 17 0 - 40 IU/L   ALT 27 0 - 32 IU/L  Lipid Panel w/o Chol/HDL Ratio  Result Value Ref Range   Cholesterol, Total 197 100 - 199 mg/dL   Triglycerides 062 0 - 149 mg/dL   HDL 53 >69 mg/dL   VLDL Cholesterol Cal 24 5 - 40 mg/dL   LDL Chol Calc (NIH) 485 (H) 0 - 99 mg/dL  Urinalysis, Routine w reflex microscopic  Result Value Ref Range   Specific Gravity, UA <1.005 (L) 1.005 - 1.030   pH, UA 5.5 5.0 - 7.5   Color, UA Yellow Yellow   Appearance Ur Clear Clear   Leukocytes,UA Negative Negative   Protein,UA Negative Negative/Trace   Glucose, UA Negative Negative   Ketones, UA Negative Negative   RBC, UA Negative Negative   Bilirubin, UA Negative Negative   Urobilinogen, Ur 0.2 0.2 - 1.0 mg/dL   Nitrite, UA Negative Negative  TSH  Result Value Ref Range   TSH 1.200 0.450 - 4.500 uIU/mL      Assessment & Plan:   Problem List Items Addressed This Visit      Respiratory   Asthma    Under good control on current regimen. Continue current regimen. Continue to monitor. Call with any concerns. Refills given.        Relevant Medications   budesonide-formoterol (SYMBICORT) 160-4.5 MCG/ACT inhaler    Other Visit Diagnoses    Acute non-recurrent maxillary sinusitis    -  Primary   Will treat with augmentin as it's been nearly a month. Call with any concerns. Continue to monitor.    Relevant Medications   amoxicillin-clavulanate (AUGMENTIN) 875-125 MG tablet   fluconazole  (DIFLUCAN) 150 MG tablet       Follow up plan: Return Feb Physical.

## 2021-06-16 ENCOUNTER — Other Ambulatory Visit: Payer: Self-pay | Admitting: Family Medicine

## 2021-06-16 NOTE — Telephone Encounter (Signed)
Requested Prescriptions  Pending Prescriptions Disp Refills  . montelukast (SINGULAIR) 10 MG tablet [Pharmacy Med Name: MONTELUKAST SOD 10 MG TABLET] 90 tablet 1    Sig: TAKE 1 TABLET BY MOUTH EVERYDAY AT BEDTIME     Pulmonology:  Leukotriene Inhibitors Passed - 06/16/2021  9:16 AM      Passed - Valid encounter within last 12 months    Recent Outpatient Visits          3 months ago Acute non-recurrent maxillary sinusitis   [facility] [place], Megan P, DO   4 months ago Mild intermittent asthma without complication   [facility] [place], Dorie Rank, NP   6 months ago Routine general medical examination at a health care facility   [facility], [place], DO   1 year ago Rib pain   [facility] [place], [place], DO   1 year ago [place] stones   [facility] [place], [place], DO      Future Appointments            In 5 months Laural Benes, Oralia Rud, DO [employer], [facility]

## 2021-07-20 DIAGNOSIS — M9909 Segmental and somatic dysfunction of abdomen and other regions: Secondary | ICD-10-CM | POA: Diagnosis not present

## 2021-07-20 DIAGNOSIS — M9905 Segmental and somatic dysfunction of pelvic region: Secondary | ICD-10-CM | POA: Diagnosis not present

## 2021-07-20 DIAGNOSIS — M9904 Segmental and somatic dysfunction of sacral region: Secondary | ICD-10-CM | POA: Diagnosis not present

## 2021-07-20 DIAGNOSIS — R2689 Other abnormalities of gait and mobility: Secondary | ICD-10-CM | POA: Diagnosis not present

## 2021-07-20 DIAGNOSIS — M9906 Segmental and somatic dysfunction of lower extremity: Secondary | ICD-10-CM | POA: Diagnosis not present

## 2021-08-20 DIAGNOSIS — Z01411 Encounter for gynecological examination (general) (routine) with abnormal findings: Secondary | ICD-10-CM | POA: Diagnosis not present

## 2021-08-20 DIAGNOSIS — Z6831 Body mass index (BMI) 31.0-31.9, adult: Secondary | ICD-10-CM | POA: Diagnosis not present

## 2021-08-20 DIAGNOSIS — Z01419 Encounter for gynecological examination (general) (routine) without abnormal findings: Secondary | ICD-10-CM | POA: Diagnosis not present

## 2021-08-20 DIAGNOSIS — Z124 Encounter for screening for malignant neoplasm of cervix: Secondary | ICD-10-CM | POA: Diagnosis not present

## 2021-08-20 DIAGNOSIS — Z3041 Encounter for surveillance of contraceptive pills: Secondary | ICD-10-CM | POA: Diagnosis not present

## 2021-08-20 LAB — HM PAP SMEAR

## 2021-09-16 ENCOUNTER — Telehealth: Payer: BC Managed Care – PPO | Admitting: Family

## 2021-09-16 DIAGNOSIS — B370 Candidal stomatitis: Secondary | ICD-10-CM | POA: Diagnosis not present

## 2021-09-16 MED ORDER — FLUCONAZOLE 150 MG PO TABS: 150.0000 mg | ORAL_TABLET | ORAL | 0 refills | Status: DC | PRN

## 2021-09-16 MED ORDER — NYSTATIN 100000 UNIT/ML MT SUSP
5.0000 mL | Freq: Four times a day (QID) | OROMUCOSAL | 0 refills | Status: DC
Start: 2021-09-16 — End: 2021-11-15

## 2021-09-16 NOTE — Progress Notes (Signed)
  E-Visit for Sore Throat  We are sorry that you are not feeling well.  Here is how we plan to help!  Your symptoms indicate oral thrush. I have sent in nystatin oral solution that you can use four times a day. I have also sent in diflucan 150 mg every 3 days.    Home Care: Only take medications as instructed by your medical team. Do not drink alcohol while taking these medications. A steam or ultrasonic humidifier can help congestion.  You can place a towel over your head and breathe in the steam from hot water coming from a faucet. Avoid close contacts especially the very young and the elderly. Cover your mouth when you cough or sneeze. Always remember to wash your hands.  Get Help Right Away If: You develop worsening fever or throat pain. You develop a severe head ache or visual changes. Your symptoms persist after you have completed your treatment plan.  Make sure you Understand these instructions. Will watch your condition. Will get help right away if you are not doing well or get worse.   Thank you for choosing an e-visit.  Your e-visit answers were reviewed by a board certified advanced clinical practitioner to complete your personal care plan. Depending upon the condition, your plan could have included both over the counter or prescription medications.  Please review your pharmacy choice. Make sure the pharmacy is open so you can pick up prescription now. If there is a problem, you may contact your provider through [employer] and have the prescription routed to another pharmacy.  Your safety is important to [place]. If you have drug allergies check your prescription carefully.   For the next 24 hours you can use MyChart to ask questions about today's visit, request a non-urgent call back, or ask for a work or school excuse. You will get an email in the next two days asking about your experience. I hope that your e-visit has been valuable and will speed your  recovery.   Approximately 5 minutes was spent documenting and reviewing patient's chart.

## 2021-10-15 ENCOUNTER — Ambulatory Visit: Payer: Self-pay | Admitting: *Deleted

## 2021-10-15 ENCOUNTER — Encounter: Payer: Self-pay | Admitting: Nurse Practitioner

## 2021-10-15 ENCOUNTER — Other Ambulatory Visit: Payer: Self-pay

## 2021-10-15 ENCOUNTER — Ambulatory Visit (INDEPENDENT_AMBULATORY_CARE_PROVIDER_SITE_OTHER): Payer: BC Managed Care – PPO | Admitting: Nurse Practitioner

## 2021-10-15 VITALS — BP 131/83 | HR 100 | Temp 99.1°F | Resp 16

## 2021-10-15 DIAGNOSIS — J4541 Moderate persistent asthma with (acute) exacerbation: Secondary | ICD-10-CM

## 2021-10-15 MED ORDER — AZITHROMYCIN 250 MG PO TABS: ORAL_TABLET | ORAL | 0 refills | Status: DC

## 2021-10-15 MED ORDER — PROMETHAZINE-DM 6.25-15 MG/5ML PO SYRP: 5.0000 mL | ORAL_SOLUTION | Freq: Four times a day (QID) | ORAL | 0 refills | Status: DC | PRN

## 2021-10-15 MED ORDER — FLUCONAZOLE 150 MG PO TABS: 150.0000 mg | ORAL_TABLET | Freq: Once | ORAL | 0 refills | Status: AC

## 2021-10-15 MED ORDER — PREDNISONE 10 MG PO TABS: ORAL_TABLET | ORAL | 0 refills | Status: DC

## 2021-10-15 NOTE — Progress Notes (Signed)
Acute Office Visit  Subjective:    Patient ID: Karla Huber, female    DOB: [birth year], 35 y.o.   MRN: [id number]  Chief Complaint  Patient presents with   URI    HPI Patient is in today for cough, congestion since Tuesday. Daughter recently tested positive for the flu a week ago. She states that her symptoms got better on Friday, and then got worse again.   UPPER RESPIRATORY TRACT INFECTION  Worst symptom: wheezing Fever: yes Cough: yes Shortness of breath: no Wheezing: yes Chest pain: no Chest tightness: yes Chest congestion: yes Nasal congestion: no Runny nose: no Post nasal drip: no Sneezing: no Sore throat: no Swollen glands: no Sinus pressure: yes Headache: no Face pain: no Toothache: no Ear pain: no  Ear pressure: yes bilateral Eyes red/itching:no Eye drainage/crusting: no  Vomiting: no Rash: no Fatigue: yes Sick contacts: yes Strep contacts: no  Context: worse Recurrent sinusitis: no Relief with OTC cold/cough medications: no  Treatments attempted: mucinex-D    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   Menometrorrhagia 12/08/2017   Postpartum care following cesarean delivery (4/30) 02/25/2015   Postpartum care following repeat cesarean delivery (1/16) 11/13/2016   Preeclampsia    Traumatic injury during pregnancy in third trimester 02/09/2015    Past Surgical History:  Procedure Laterality Date   ADENOIDECTOMY     CESAREAN SECTION N/A 02/25/2015   Procedure: CESAREAN SECTION;  Surgeon: Shea Evans, MD;  Location: [facility];  Service: Obstetrics;  Laterality: N/A;   CESAREAN SECTION N/A 11/12/2016   Procedure: Repeat CESAREAN SECTION;  Surgeon:  Olivia Mackie, MD;  Location: [facility] BIRTHING SU[facility];  Service: Obstetrics;  Laterality: N/A;   FOOT SURGERY     KNEE SURGERY     TONSILLECTOMY      Family History  Problem Relation Age of Onset   Heart disease Mother    Mitral valve prolapse Father    Asthma Sister    Hypertension Maternal Grandmother    Cancer Cousin        AML    Social History   Socioeconomic History   Marital status: Married    Spouse name: Not on file   Number of children: Not on file   Years of education: Not on file   Highest education level: Not on file  Occupational History   Occupation: umemployed  Tobacco Use   Smoking status: Never   Smokeless tobacco: Never  Vaping Use   Vaping Use: Never used  Substance and Sexual Activity   Alcohol use: No   Drug use: No   Sexual activity: Yes    Birth control/protection: Pill  Other Topics Concern   Not on file  Social History Narrative   Exercises try 3-5 times a week for 30-73min   Social Determinants of Health   Financial Resource Strain: Not on file  Food Insecurity: Not on file  Transportation Needs: Not on file  Physical Activity: Not on file  Stress: Not on file  Social Connections: Not on file  Intimate Partner Violence: Not on file    Outpatient Medications Prior to Visit  Medication Sig Dispense Refill  albuterol (VENTOLIN HFA) 108 (90 Base) MCG/ACT inhaler Inhale 1-2 puffs into the lungs every 4 (four) hours as needed for wheezing or shortness of breath. 1 each 6   budesonide-formoterol (SYMBICORT) 160-4.5 MCG/ACT inhaler Inhale 2 puffs into the lungs 2 (two) times daily. 3 each 3   cetirizine (ZYRTEC) 10 MG tablet Take 10 mg by mouth daily.     montelukast (SINGULAIR) 10 MG tablet TAKE 1 TABLET BY MOUTH EVERYDAY AT BEDTIME 90 tablet 1   norgestrel-ethinyl estradiol (LOW-OGESTREL) 0.3-30 MG-MCG tablet      SOOLANTRA 1 % CREA Apply topically.     SUMAtriptan (IMITREX) 100 MG tablet Take 1 tablet (100 mg total) by mouth every 2 (two)  hours as needed for migraine. May repeat in 2 hours if headache persists or recurs. 10 tablet 12   amoxicillin-clavulanate (AUGMENTIN) 875-125 MG tablet Take 1 tablet by mouth 2 (two) times daily. (Patient not taking: Reported on 10/15/2021) 20 tablet 0   nystatin (MYCOSTATIN) 100000 UNIT/ML suspension Take 5 mLs (500,000 Units total) by mouth 4 (four) times daily. (Patient not taking: Reported on 10/15/2021) 60 mL 0   omeprazole (PRILOSEC OTC) 20 MG tablet Take 20 mg by mouth daily. (Patient not taking: Reported on 10/15/2021)     fluconazole (DIFLUCAN) 150 MG tablet Take 1 tablet (150 mg total) by mouth every three (3) days as needed. (Patient not taking: Reported on 10/15/2021) 2 tablet 0   No facility-administered medications prior to visit.    Allergies  Allergen Reactions   Hydrocodone-Acetaminophen Hives   Oxycodone-Acetaminophen Nausea And Vomiting    Projectile vomiting   Hydrocodone Hives   Tdap [Tetanus-Diphth-Acell Pertussis] Other (See Comments)    Fever, Joint stiffness    Review of Systems  Constitutional:  Positive for fatigue and fever.  HENT:  Positive for sinus pressure. Negative for congestion, ear pain, postnasal drip, rhinorrhea, sneezing and sore throat.   Eyes: Negative.   Respiratory:  Positive for cough, chest tightness, shortness of breath and wheezing.   Cardiovascular: Negative.   Gastrointestinal: Negative.   Endocrine: Negative.   Genitourinary: Negative.   Musculoskeletal:  Positive for myalgias.  Skin: Negative.   Neurological: Negative.   Psychiatric/Behavioral: Negative.        Objective:    Physical Exam Vitals and nursing note reviewed.  Constitutional:      General: She is not in acute distress.    Appearance: Normal appearance.  HENT:     Head: Normocephalic.     Right Ear: Tympanic membrane, ear canal and external ear normal.     Left Ear: Tympanic membrane, ear canal and external ear normal.  Eyes:     Conjunctiva/sclera:  Conjunctivae normal.  Cardiovascular:     Rate and Rhythm: Normal rate and regular rhythm.     Pulses: Normal pulses.     Heart sounds: Normal heart sounds.  Pulmonary:     Effort: Pulmonary effort is normal.     Breath sounds: Wheezing and rhonchi present.  Musculoskeletal:     Cervical back: Normal range of motion.  Skin:    General: Skin is warm.  Neurological:     General: No focal deficit present.     Mental Status: She is alert and oriented to person, place, and time.  Psychiatric:        Mood and Affect: Mood normal.        Behavior: Behavior normal.        Thought Content: Thought content normal.  Judgment: Judgment normal.    BP 131/83 (BP Location: Left Arm, Patient Position: Sitting)    Pulse 100    Temp 99.1 F (37.3 C) (Oral)    Resp 16    SpO2 99%  Wt Readings from Last 3 Encounters:  03/02/21 172 lb 6.4 oz (78.2 kg)  01/19/21 173 lb 12.8 oz (78.8 kg)  11/29/20 174 lb 3.2 oz (79 kg)    Health Maintenance Due  Topic Date Due   Pneumococcal Vaccine 36-44 Years old (1 - PCV) Never done   COVID-19 Vaccine (5 - Booster) 11/09/2020   PAP SMEAR-Modifier  12/26/2020   INFLUENZA VACCINE  05/28/2021    There are no preventive care reminders to display for this patient.   Lab Results  Component Value Date   TSH 1.200 11/29/2020   Lab Results  Component Value Date   WBC 8.5 11/29/2020   HGB 14.9 11/29/2020   HCT 44.9 11/29/2020   MCV 97 11/29/2020   PLT 351 11/29/2020   Lab Results  Component Value Date   NA 140 11/29/2020   K 4.4 11/29/2020   CO2 22 11/29/2020   GLUCOSE 93 11/29/2020   BUN 9 11/29/2020   CREATININE 0.86 11/29/2020   BILITOT 0.4 11/29/2020   ALKPHOS 66 11/29/2020   AST 17 11/29/2020   ALT 27 11/29/2020   PROT 7.3 11/29/2020   ALBUMIN 4.8 11/29/2020   CALCIUM 10.2 11/29/2020   ANIONGAP 12 04/12/2020   GFR 112.14 08/29/2017   Lab Results  Component Value Date   CHOL 197 11/29/2020   Lab Results  Component Value Date    HDL 53 11/29/2020   Lab Results  Component Value Date   LDLCALC 120 (H) 11/29/2020   Lab Results  Component Value Date   TRIG 137 11/29/2020   Lab Results  Component Value Date   CHOLHDL 2 08/29/2017   Lab Results  Component Value Date   HGBA1C 5.5 08/27/2016       Assessment & Plan:   Problem List Items Addressed This Visit   None Visit Diagnoses     Moderate persistent asthma with exacerbation    -  Primary   Will treat with prednisone taper and zpak. Continue albuterol inhaler every 4 hours. Will give cough medicine as needed. F/U if not improving.    Relevant Medications   predniSONE (DELTASONE) 10 MG tablet        Meds ordered this encounter  Medications   predniSONE (DELTASONE) 10 MG tablet    Sig: Take 6 tablets today, then 5 tablets tomorrow, then decrease by 1 tablet every day until gone    Dispense:  21 tablet    Refill:  0   azithromycin (ZITHROMAX Z-PAK) 250 MG tablet    Sig: Take 2 tablets today, then one tablet daily until gone    Dispense:  6 each    Refill:  0   fluconazole (DIFLUCAN) 150 MG tablet    Sig: Take 1 tablet (150 mg total) by mouth once for 1 dose.    Dispense:  1 tablet    Refill:  0   promethazine-dextromethorphan (PROMETHAZINE-DM) 6.25-15 MG/5ML syrup    Sig: Take 5 mLs by mouth 4 (four) times daily as needed for cough.    Dispense:  118 mL    Refill:  0     Gerre Scull, NP

## 2021-10-15 NOTE — Telephone Encounter (Signed)
°  Chief Complaint: Wheezing with cough off and on Symptoms: non productive cough, pt states chest sounds junky Frequency: though the night with coughing Pertinent Negatives: Patient denies SOB, fever at this time, congestion or other symptoms Disposition: [] ED /[] Urgent Care (no appt availability in office) / [x] Appointment(In office/virtual)/ []  [facility]/ [] Home Care/ [] Refused Recommended Disposition  Additional Notes: Patient experienced wheezing off and on with coughing over the past day. Hx of asthma and reports albuterol has helped.  Patient tested positive for Flu last Tuesday after 6 year daughter tested positive. Appt scheduled for today.   Reason for Disposition  [1] Fever returns after gone for over 24 hours AND [2] symptoms worse or not improved  Answer Assessment - Initial Assessment Questions 1. WORST SYMPTOM: "What is your worst symptom?" (e.g., cough, runny nose, muscle aches, headache, sore throat, fever)      Wheezing cough 2. ONSET: "When did your flu symptoms start?"      Diagnosed with the flu on last Tuesday after daughter tested positive 3. COUGH: "How bad is the cough?"       N/a 4. RESPIRATORY DISTRESS: "Describe your breathing."      Patient states that wheezing started through the night with coughing. Denies SOB 5. FEVER: "Do you have a fever?" If Yes, ask: "What is your temperature, how was it measured, and when did it start?"     Denies fever at this time 6. EXPOSURE: "Were you exposed to someone with influenza?"       Yes 64 yo daughter tested positive for the flue 7. FLU VACCINE: "Did you get a flu shot this year?"     Yes 8. HIGH RISK DISEASE: "Do you have any chronic medical problems?" (e.g., heart or lung disease, asthma, weak immune system, or other HIGH RISK conditions)     asthma 9. PREGNANCY: "Is there any chance you are pregnant?" "When was your last menstrual period?"     N/a 10. OTHER SYMPTOMS: "Do you have any other symptoms?"   (e.g., runny nose, muscle aches, headache, sore throat)       On complaints of other symptoms  Protocols used: Influenza - [facility]

## 2021-11-15 ENCOUNTER — Ambulatory Visit (INDEPENDENT_AMBULATORY_CARE_PROVIDER_SITE_OTHER): Payer: BC Managed Care – PPO | Admitting: Nurse Practitioner

## 2021-11-15 ENCOUNTER — Encounter: Payer: Self-pay | Admitting: Nurse Practitioner

## 2021-11-15 ENCOUNTER — Other Ambulatory Visit: Payer: Self-pay

## 2021-11-15 VITALS — BP 104/72 | HR 94 | Temp 98.9°F | Wt 170.0 lb

## 2021-11-15 DIAGNOSIS — M545 Low back pain, unspecified: Secondary | ICD-10-CM | POA: Diagnosis not present

## 2021-11-15 MED ORDER — CYCLOBENZAPRINE HCL 10 MG PO TABS: 10.0000 mg | ORAL_TABLET | Freq: Three times a day (TID) | ORAL | 0 refills | Status: DC | PRN

## 2021-11-15 MED ORDER — METHYLPREDNISOLONE 4 MG PO TBPK: ORAL_TABLET | ORAL | 0 refills | Status: DC

## 2021-11-15 NOTE — Progress Notes (Signed)
BP 104/72    Pulse 94    Temp 98.9 F (37.2 C) (Oral)    Wt 170 lb (77.1 kg)    SpO2 98%    BMI 30.60 kg/m    Subjective:    Patient ID: Karla Huber, female    DOB: [birth year], 36 y.o.   MRN: [id number]  HPI: Karla Huber is a 36 y.o. female  Chief Complaint  Patient presents with   Back Pain    Patient states the lower back pain that started last Wednesday. Patient states she woke up one morning and was completely stiff and then the pain gradually has gotten worse. Patient states she has tried Tylenol, Motrin and [employer]. Patient states the only sense of relief are over the counter Lidocaine patches. Patient states the pain is a constant pain and really deep ache and really stiff.    BACK PAIN Started last Wednesday -- has had SI joint pain before, but this is different.  No injuries or falls prior to pain.   Duration: weeks Mechanism of injury: unknown Location: midline and bilateral Onset: sudden Severity: 4/10 today and at worst was 8/10 Quality: sharp, dull, aching, and throbbing Frequency: constant Radiation: buttocks Aggravating factors: lifting, movement, and walking Alleviating factors: ice, heat, NSAIDs, and APAP Status: fluctuating Treatments attempted: rest, ice, heat, APAP, and ibuprofen , Flexeril, Lidocaine patches (Icy/Hot) Relief with NSAIDs?: moderate Nighttime pain:   does wake her up Paresthesias / decreased sensation:  no Bowel / bladder incontinence:  no Fevers:  no Dysuria / urinary frequency:  no   Relevant past medical, surgical, family and social history reviewed and updated as indicated. Interim medical history since our last visit reviewed. Allergies and medications reviewed and updated.  Review of Systems  Constitutional: Negative.   Respiratory: Negative.    Cardiovascular: Negative.   Musculoskeletal:  Positive for back pain.  Neurological: Negative.   Psychiatric/Behavioral: Negative.     Per HPI unless specifically indicated  above     Objective:    BP 104/72    Pulse 94    Temp 98.9 F (37.2 C) (Oral)    Wt 170 lb (77.1 kg)    SpO2 98%    BMI 30.60 kg/m   Wt Readings from Last 3 Encounters:  11/15/21 170 lb (77.1 kg)  03/02/21 172 lb 6.4 oz (78.2 kg)  01/19/21 173 lb 12.8 oz (78.8 kg)    Physical Exam Vitals and nursing note reviewed.  Constitutional:      General: She is awake. She is not in acute distress.    Appearance: She is well-developed and well-groomed. She is not ill-appearing or toxic-appearing.  HENT:     Head: Normocephalic.     Right Ear: Hearing normal.     Left Ear: Hearing normal.  Eyes:     General: Lids are normal.        Right eye: No discharge.        Left eye: No discharge.     Conjunctiva/sclera: Conjunctivae normal.     Pupils: Pupils are equal, round, and reactive to light.  Cardiovascular:     Rate and Rhythm: Normal rate and regular rhythm.     Heart sounds: Normal heart sounds. No murmur heard.   No gallop.  Pulmonary:     Effort: Pulmonary effort is normal. No accessory muscle usage or respiratory distress.     Breath sounds: Normal breath sounds.  Abdominal:     General: Bowel sounds are normal.  Palpations: Abdomen is soft. There is no hepatomegaly or splenomegaly.  Musculoskeletal:     Cervical back: Normal range of motion and neck supple.     Lumbar back: Tenderness (lower back -- across) present. No swelling, edema, spasms or bony tenderness. Decreased range of motion (with extension and lateral movement). Negative right straight leg raise test and negative left straight leg raise test. No scoliosis.     Right lower leg: No edema.     Left lower leg: No edema.     Comments: No rashes noted.  Skin:    General: Skin is warm and dry.  Neurological:     Mental Status: She is alert and oriented to person, place, and time.     Deep Tendon Reflexes: Reflexes are normal and symmetric.     Reflex Scores:      Brachioradialis reflexes are 2+ on the right side  and 2+ on the left side.      Patellar reflexes are 2+ on the right side and 2+ on the left side. Psychiatric:        Attention and Perception: Attention normal.        Mood and Affect: Mood normal.        Speech: Speech normal.        Behavior: Behavior normal. Behavior is cooperative.        Thought Content: Thought content normal.    Results for orders placed or performed in visit on 11/29/20  CBC with Differential/Platelet  Result Value Ref Range   WBC 8.5 3.4 - 10.8 x10E3/uL   RBC 4.64 3.77 - 5.28 x10E6/uL   Hemoglobin 14.9 11.1 - 15.9 g/dL   Hematocrit 44.9 34.0 - 46.6 %   MCV 97 79 - 97 fL   MCH 32.1 26.6 - 33.0 pg   MCHC 33.2 31.5 - 35.7 g/dL   RDW 11.9 11.7 - 15.4 %   Platelets 351 150 - 450 x10E3/uL   Neutrophils 72 Not Estab. %   Lymphs 21 Not Estab. %   Monocytes 5 Not Estab. %   Eos 1 Not Estab. %   Basos 1 Not Estab. %   Neutrophils Absolute 6.1 1.4 - 7.0 x10E3/uL   Lymphocytes Absolute 1.8 0.7 - 3.1 x10E3/uL   Monocytes Absolute 0.4 0.1 - 0.9 x10E3/uL   EOS (ABSOLUTE) 0.1 0.0 - 0.4 x10E3/uL   Basophils Absolute 0.0 0.0 - 0.2 x10E3/uL   Immature Granulocytes 0 Not Estab. %   Immature Grans (Abs) 0.0 0.0 - 0.1 x10E3/uL  Comprehensive metabolic panel  Result Value Ref Range   Glucose 93 65 - 99 mg/dL   BUN 9 6 - 20 mg/dL   Creatinine, Ser 0.86 0.57 - 1.00 mg/dL   GFR calc non Af Amer 88 >59 mL/min/1.73   GFR calc Af Amer 101 >59 mL/min/1.73   BUN/Creatinine Ratio 10 9 - 23   Sodium 140 134 - 144 mmol/L   Potassium 4.4 3.5 - 5.2 mmol/L   Chloride 103 96 - 106 mmol/L   CO2 22 20 - 29 mmol/L   Calcium 10.2 8.7 - 10.2 mg/dL   Total Protein 7.3 6.0 - 8.5 g/dL   Albumin 4.8 3.8 - 4.8 g/dL   Globulin, Total 2.5 1.5 - 4.5 g/dL   Albumin/Globulin Ratio 1.9 1.2 - 2.2   Bilirubin Total 0.4 0.0 - 1.2 mg/dL   Alkaline Phosphatase 66 44 - 121 IU/L   AST 17 0 - 40 IU/L   ALT 27 0 - 32  IU/L  Lipid Panel w/o Chol/HDL Ratio  Result Value Ref Range   Cholesterol,  Total 197 100 - 199 mg/dL   Triglycerides 137 0 - 149 mg/dL   HDL 53 >39 mg/dL   VLDL Cholesterol Cal 24 5 - 40 mg/dL   LDL Chol Calc (NIH) 120 (H) 0 - 99 mg/dL  Urinalysis, Routine w reflex microscopic  Result Value Ref Range   Specific Gravity, UA <1.005 (L) 1.005 - 1.030   pH, UA 5.5 5.0 - 7.5   Color, UA Yellow Yellow   Appearance Ur Clear Clear   Leukocytes,UA Negative Negative   Protein,UA Negative Negative/Trace   Glucose, UA Negative Negative   Ketones, UA Negative Negative   RBC, UA Negative Negative   Bilirubin, UA Negative Negative   Urobilinogen, Ur 0.2 0.2 - 1.0 mg/dL   Nitrite, UA Negative Negative  TSH  Result Value Ref Range   TSH 1.200 0.450 - 4.500 uIU/mL      Assessment & Plan:   Problem List Items Addressed This Visit       Other   Acute bilateral low back pain without sciatica - Primary    Acute x one week with some improvement with home treatment.  At this time suspect more musculoskeletal in nature with possibly some underlying DDD.  Will send in Flexeril, new script as current one is outdated, and steroid taper.  Recommend focus on gentle stretching at home with yoga or lower back/hamstring focused stretches, educated her on this.  Continue at home regimen, including lidocaine patches which are offering benefit.  Return as needed for worsening or ongoing pain.      Relevant Medications   methylPREDNISolone (MEDROL DOSEPAK) 4 MG TBPK tablet   cyclobenzaprine (FLEXERIL) 10 MG tablet     Follow up plan: Return if symptoms worsen or fail to improve.

## 2021-11-15 NOTE — Assessment & Plan Note (Signed)
Acute x one week with some improvement with home treatment.  At this time suspect more musculoskeletal in nature with possibly some underlying DDD.  Will send in Flexeril, new script as current one is outdated, and steroid taper.  Recommend focus on gentle stretching at home with yoga or lower back/hamstring focused stretches, educated her on this.  Continue at home regimen, including lidocaine patches which are offering benefit.  Return as needed for worsening or ongoing pain.

## 2021-11-15 NOTE — Patient Instructions (Signed)

## 2021-11-26 ENCOUNTER — Other Ambulatory Visit: Payer: Self-pay

## 2021-11-26 ENCOUNTER — Ambulatory Visit (INDEPENDENT_AMBULATORY_CARE_PROVIDER_SITE_OTHER): Payer: BC Managed Care – PPO | Admitting: Family Medicine

## 2021-11-26 ENCOUNTER — Encounter: Payer: Self-pay | Admitting: Family Medicine

## 2021-11-26 VITALS — BP 120/79 | HR 97 | Temp 98.1°F | Wt 167.2 lb

## 2021-11-26 DIAGNOSIS — M545 Low back pain, unspecified: Secondary | ICD-10-CM

## 2021-11-26 DIAGNOSIS — M9901 Segmental and somatic dysfunction of cervical region: Secondary | ICD-10-CM | POA: Diagnosis not present

## 2021-11-26 DIAGNOSIS — M9909 Segmental and somatic dysfunction of abdomen and other regions: Secondary | ICD-10-CM

## 2021-11-26 DIAGNOSIS — M9904 Segmental and somatic dysfunction of sacral region: Secondary | ICD-10-CM

## 2021-11-26 DIAGNOSIS — M9902 Segmental and somatic dysfunction of thoracic region: Secondary | ICD-10-CM

## 2021-11-26 DIAGNOSIS — M9900 Segmental and somatic dysfunction of head region: Secondary | ICD-10-CM | POA: Diagnosis not present

## 2021-11-26 DIAGNOSIS — M9903 Segmental and somatic dysfunction of lumbar region: Secondary | ICD-10-CM | POA: Diagnosis not present

## 2021-11-26 DIAGNOSIS — M9905 Segmental and somatic dysfunction of pelvic region: Secondary | ICD-10-CM

## 2021-11-26 DIAGNOSIS — M9908 Segmental and somatic dysfunction of rib cage: Secondary | ICD-10-CM | POA: Diagnosis not present

## 2021-11-26 NOTE — Progress Notes (Signed)
BP 120/79    Pulse 97    Temp 98.1 F (36.7 C)    Wt 167 lb 3.2 oz (75.8 kg)    SpO2 99%    BMI 30.09 kg/m    Subjective:    Patient ID: Karla Huber, female    DOB: 1986-08-02, 36 y.o.   MRN: [id number]  HPI: Karla Huber is a 36 y.o. female  Chief Complaint  Patient presents with   Neck Pain    Patient here for OMM     Neck and back have been hurting for like 2 weeks. She was seen here and given a steroid taper and muscle relaxer. She is not sure what she did. She leaned over a bathtub to wash her kids, but is unsure if that's what set her off. Pain is in her SI joint L>R. Pain is aching and feels like it needs to pop. Worse with certain positions. Better with the prednisone and muscle relaxer. Pain does not radiate. She notes that she is otherwise doing well with no other concerns or complaints at this time  Relevant past medical, surgical, family and social history reviewed and updated as indicated. Interim medical history since our last visit reviewed. Allergies and medications reviewed and updated.  Review of Systems  Constitutional: Negative.   Respiratory: Negative.    Cardiovascular: Negative.   Gastrointestinal: Negative.   Musculoskeletal:  Positive for back pain, myalgias, neck pain and neck stiffness. Negative for arthralgias, gait problem and joint swelling.  Skin: Negative.   Neurological: Negative.   Psychiatric/Behavioral: Negative.     Per HPI unless specifically indicated above     Objective:    BP 120/79    Pulse 97    Temp 98.1 F (36.7 C)    Wt 167 lb 3.2 oz (75.8 kg)    SpO2 99%    BMI 30.09 kg/m   Wt Readings from Last 3 Encounters:  11/26/21 167 lb 3.2 oz (75.8 kg)  11/15/21 170 lb (77.1 kg)  03/02/21 172 lb 6.4 oz (78.2 kg)    Physical Exam Vitals and nursing note reviewed.  Constitutional:      General: She is not in acute distress.    Appearance: Normal appearance. She is not ill-appearing.  HENT:     Head: Normocephalic and  atraumatic.     Right Ear: External ear normal.     Left Ear: External ear normal.     Nose: Nose normal.     Mouth/Throat:     Mouth: Mucous membranes are moist.     Pharynx: Oropharynx is clear.  Eyes:     Extraocular Movements: Extraocular movements intact.     Conjunctiva/sclera: Conjunctivae normal.     Pupils: Pupils are equal, round, and reactive to light.  Neck:     Vascular: No carotid bruit.  Cardiovascular:     Rate and Rhythm: Normal rate.     Pulses: Normal pulses.  Pulmonary:     Effort: Pulmonary effort is normal. No respiratory distress.  Abdominal:     General: Abdomen is flat. There is no distension.     Palpations: Abdomen is soft. There is no mass.     Tenderness: There is no abdominal tenderness. There is no right CVA tenderness, left CVA tenderness, guarding or rebound.     Hernia: No hernia is present.  Musculoskeletal:     Cervical back: No muscular tenderness.  Lymphadenopathy:     Cervical: No cervical adenopathy.  Skin:  General: Skin is warm and dry.     Capillary Refill: Capillary refill takes less than 2 seconds.     Coloration: Skin is not jaundiced or pale.     Findings: No bruising, erythema, lesion or rash.  Neurological:     General: No focal deficit present.     Mental Status: She is alert. Mental status is at baseline.  Psychiatric:        Mood and Affect: Mood normal.        Behavior: Behavior normal.        Thought Content: Thought content normal.        Judgment: Judgment normal.   Musculoskeletal:  Exam found Decreased ROM, Tissue texture changes, Tenderness to palpation, and Asymmetry of patient's  head, neck, thorax, ribs, lumbar, pelvis, sacrum, and abdomen Osteopathic Structural Exam:   Head: hypertonic suboccipital muscles OAESSR   Neck: C3ESRL  Thorax: T4-6SRRL  Ribs: Ribs 5-7 locked up on the L, Rib 5 locked up on the R  Lumbar: QL hypertonic on the R, L4-5SLRR  Pelvis: Anterior L innominate  Sacrum: L on R torsion, SI  joint restricted on the L  Abdomen: diaphragm hypertonic bilaterally R>L  Results for orders placed or performed in visit on 11/29/20  CBC with Differential/Platelet  Result Value Ref Range   WBC 8.5 3.4 - 10.8 x10E3/uL   RBC 4.64 3.77 - 5.28 x10E6/uL   Hemoglobin 14.9 11.1 - 15.9 g/dL   Hematocrit 44.9 34.0 - 46.6 %   MCV 97 79 - 97 fL   MCH 32.1 26.6 - 33.0 pg   MCHC 33.2 31.5 - 35.7 g/dL   RDW 11.9 11.7 - 15.4 %   Platelets 351 150 - 450 x10E3/uL   Neutrophils 72 Not Estab. %   Lymphs 21 Not Estab. %   Monocytes 5 Not Estab. %   Eos 1 Not Estab. %   Basos 1 Not Estab. %   Neutrophils Absolute 6.1 1.4 - 7.0 x10E3/uL   Lymphocytes Absolute 1.8 0.7 - 3.1 x10E3/uL   Monocytes Absolute 0.4 0.1 - 0.9 x10E3/uL   EOS (ABSOLUTE) 0.1 0.0 - 0.4 x10E3/uL   Basophils Absolute 0.0 0.0 - 0.2 x10E3/uL   Immature Granulocytes 0 Not Estab. %   Immature Grans (Abs) 0.0 0.0 - 0.1 x10E3/uL  Comprehensive metabolic panel  Result Value Ref Range   Glucose 93 65 - 99 mg/dL   BUN 9 6 - 20 mg/dL   Creatinine, Ser 0.86 0.57 - 1.00 mg/dL   GFR calc non Af Amer 88 >59 mL/min/1.73   GFR calc Af Amer 101 >59 mL/min/1.73   BUN/Creatinine Ratio 10 9 - 23   Sodium 140 134 - 144 mmol/L   Potassium 4.4 3.5 - 5.2 mmol/L   Chloride 103 96 - 106 mmol/L   CO2 22 20 - 29 mmol/L   Calcium 10.2 8.7 - 10.2 mg/dL   Total Protein 7.3 6.0 - 8.5 g/dL   Albumin 4.8 3.8 - 4.8 g/dL   Globulin, Total 2.5 1.5 - 4.5 g/dL   Albumin/Globulin Ratio 1.9 1.2 - 2.2   Bilirubin Total 0.4 0.0 - 1.2 mg/dL   Alkaline Phosphatase 66 44 - 121 IU/L   AST 17 0 - 40 IU/L   ALT 27 0 - 32 IU/L  Lipid Panel w/o Chol/HDL Ratio  Result Value Ref Range   Cholesterol, Total 197 100 - 199 mg/dL   Triglycerides 137 0 - 149 mg/dL   HDL 53 >39 mg/dL  VLDL Cholesterol Cal 24 5 - 40 mg/dL   LDL Chol Calc (NIH) 120 (H) 0 - 99 mg/dL  Urinalysis, Routine w reflex microscopic  Result Value Ref Range   Specific Gravity, UA <1.005 (L) 1.005 -  1.030   pH, UA 5.5 5.0 - 7.5   Color, UA Yellow Yellow   Appearance Ur Clear Clear   Leukocytes,UA Negative Negative   Protein,UA Negative Negative/Trace   Glucose, UA Negative Negative   Ketones, UA Negative Negative   RBC, UA Negative Negative   Bilirubin, UA Negative Negative   Urobilinogen, Ur 0.2 0.2 - 1.0 mg/dL   Nitrite, UA Negative Negative  TSH  Result Value Ref Range   TSH 1.200 0.450 - 4.500 uIU/mL      Assessment & Plan:   Problem List Items Addressed This Visit       Other   Acute bilateral low back pain without sciatica - Primary    Slightly improved, but lasting. She does have somatic dysfunction that is contributing to her symptoms. Treated today with good results as below. Call with any concerns.       Other Visit Diagnoses     Head region somatic dysfunction       Cervical (neck) region somatic dysfunction       Somatic dysfunction of rib region       Somatic dysfunction of pelvis region       Somatic dysfunction of sacral region       Somatic dysfunction of lumbar region       Segmental dysfunction of abdomen       Somatic dysfunction of thoracic region          After verbal consent was obtained, patient was treated today with osteopathic manipulative medicine to the regions of the head, neck, thorax, ribs, lumbar, pelvis, sacrum, and abdomen using the techniques of myofascial release, counterstrain, muscle energy, HVLA, and soft tissue. Areas of compensation relating to her primary pain source also treated. Patient tolerated the procedure well with good objective and good subjective improvement in symptoms. She left the room in good condition. She was advised to stay well hydrated and that she may have some soreness following the procedure. If not improving or worsening, she will call and come in. She will return for reevaluation  on a PRN basis.   Follow up plan: Return if symptoms worsen or fail to improve.

## 2021-11-26 NOTE — Assessment & Plan Note (Signed)
Slightly improved, but lasting. She does have somatic dysfunction that is contributing to her symptoms. Treated today with good results as below. Call with any concerns.

## 2021-12-05 ENCOUNTER — Encounter: Payer: Self-pay | Admitting: Family Medicine

## 2021-12-05 ENCOUNTER — Other Ambulatory Visit: Payer: Self-pay

## 2021-12-05 ENCOUNTER — Ambulatory Visit (INDEPENDENT_AMBULATORY_CARE_PROVIDER_SITE_OTHER): Payer: BC Managed Care – PPO | Admitting: Family Medicine

## 2021-12-05 VITALS — BP 105/69 | HR 80 | Temp 98.5°F | Ht 63.4 in | Wt 168.2 lb

## 2021-12-05 DIAGNOSIS — Z23 Encounter for immunization: Secondary | ICD-10-CM | POA: Diagnosis not present

## 2021-12-05 DIAGNOSIS — D509 Iron deficiency anemia, unspecified: Secondary | ICD-10-CM | POA: Diagnosis not present

## 2021-12-05 DIAGNOSIS — Z0000 Encounter for general adult medical examination without abnormal findings: Secondary | ICD-10-CM

## 2021-12-05 DIAGNOSIS — G43709 Chronic migraine without aura, not intractable, without status migrainosus: Secondary | ICD-10-CM

## 2021-12-05 DIAGNOSIS — J4520 Mild intermittent asthma, uncomplicated: Secondary | ICD-10-CM | POA: Diagnosis not present

## 2021-12-05 LAB — URINALYSIS, ROUTINE W REFLEX MICROSCOPIC
Bilirubin, UA: NEGATIVE
Glucose, UA: NEGATIVE
Leukocytes,UA: NEGATIVE
Nitrite, UA: NEGATIVE
Protein,UA: NEGATIVE
RBC, UA: NEGATIVE
Specific Gravity, UA: 1.015 (ref 1.005–1.030)
Urobilinogen, Ur: 0.2 mg/dL (ref 0.2–1.0)
pH, UA: 5.5 (ref 5.0–7.5)

## 2021-12-05 MED ORDER — ALBUTEROL SULFATE HFA 108 (90 BASE) MCG/ACT IN AERS: 1.0000 | INHALATION_SPRAY | RESPIRATORY_TRACT | 6 refills | Status: DC | PRN

## 2021-12-05 MED ORDER — MONTELUKAST SODIUM 10 MG PO TABS: ORAL_TABLET | ORAL | 1 refills | Status: DC

## 2021-12-05 MED ORDER — FLUTICASONE-SALMETEROL 250-50 MCG/ACT IN AEPB: 1.0000 | INHALATION_SPRAY | Freq: Two times a day (BID) | RESPIRATORY_TRACT | 3 refills | Status: DC

## 2021-12-05 MED ORDER — SUMATRIPTAN SUCCINATE 100 MG PO TABS: 100.0000 mg | ORAL_TABLET | ORAL | 12 refills | Status: DC | PRN

## 2021-12-05 NOTE — Assessment & Plan Note (Signed)
Will check prolactin due to breast milk expression- likely just breast milk. Await results.

## 2021-12-05 NOTE — Assessment & Plan Note (Signed)
Under good control on current regimen. Continue current regimen. Continue to monitor. Call with any concerns. Refills given.   

## 2021-12-05 NOTE — Progress Notes (Signed)
BP 105/69    Pulse 80    Temp 98.5 F (36.9 C) (Oral)    Ht 5' 3.4" (1.61 m)    Wt 168 lb 3.2 oz (76.3 kg)    LMP  (LMP Unknown)    SpO2 100%    BMI 29.42 kg/m    Subjective:    Patient ID: Karla Huber, female    DOB: 1986-04-25, 36 y.o.   MRN: [id number]  HPI: Karla Huber is a 36 y.o. female presenting on 12/05/2021 for comprehensive medical examination. Current medical complaints include:  ANEMIA Anemia status: controlled Etiology of anemia: iron def Duration of anemia treatment: chronic  Compliance with treatment: good compliance Iron supplementation side effects: no Severity of anemia: mild Fatigue: yes Decreased exercise tolerance: no  Dyspnea on exertion: no Palpitations: no Bleeding: no Pica: no  ASTHMA- was having issues with symbicort not working. Changed back to Advair and feeling better.  Asthma status: controlled Satisfied with current treatment?: yes Albuterol/rescue inhaler frequency: rarely Dyspnea frequency: rarely Wheezing frequency: rarely Cough frequency: rarely Nocturnal symptom frequency: rarely  Limitation of activity: no Current upper respiratory symptoms: no Aerochamber/spacer use: no Visits to ER or Urgent Care in past year: no Pneumovax: Up to Date Influenza: Up to Date  She currently lives with: husband and kids Menopausal Symptoms: no  Depression Screen done today and results listed below:  Depression screen [facility] 2/9 12/05/2021 11/26/2021 11/29/2020 11/12/2019 09/17/2018  Decreased Interest 0 0 0 0 0  Down, Depressed, Hopeless 0 0 0 0 0  PHQ - 2 Score 0 0 0 0 0  Altered sleeping 0 0 - - 0  Tired, decreased energy 1 0 - - 2  Change in appetite 0 0 - - 0  Feeling bad or failure about yourself  0 0 - - 0  Trouble concentrating 0 0 - - 1  Moving slowly or fidgety/restless 0 0 - - 0  Suicidal thoughts 0 0 - - 0  PHQ-9 Score 1 0 - - 3  Difficult doing work/chores Not difficult at all - - - Not difficult at all    Past Medical History:   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   Menometrorrhagia 12/08/2017   Postpartum care following cesarean delivery (4/30) 02/25/2015   Postpartum care following repeat cesarean delivery (1/16) 11/13/2016   Preeclampsia    Traumatic injury during pregnancy in third trimester 02/09/2015    Surgical History:  Past Surgical History:  Procedure Laterality Date   ADENOIDECTOMY     CESAREAN SECTION N/A 02/25/2015   Procedure: CESAREAN SECTION;  Surgeon: Shea Evans, MD;  Location: [facility];  Service: Obstetrics;  Laterality: N/A;   CESAREAN SECTION N/A 11/12/2016   Procedure: Repeat CESAREAN SECTION;  Surgeon: Olivia Mackie, MD;  Location: [facility];  Service: Obstetrics;  Laterality: N/A;   FOOT SURGERY     KNEE SURGERY     TONSILLECTOMY      Medications:  Current Outpatient Medications on File Prior to Visit  Medication Sig   cyclobenzaprine (FLEXERIL) 10 MG tablet Take 1 tablet (10 mg total) by mouth 3 (three) times daily as needed  for muscle spasms.   norgestrel-ethinyl estradiol (LOW-OGESTREL) 0.3-30 MG-MCG tablet Take 1 tablet by mouth daily.   SOOLANTRA 1 % CREA Apply topically.   No current facility-administered medications on file prior to visit.    Allergies:  Allergies  Allergen Reactions   Hydrocodone-Acetaminophen Hives   Oxycodone-Acetaminophen Nausea And Vomiting    Projectile vomiting   Hydrocodone Hives    Social History:  Social History   Socioeconomic History   Marital status: Married    Spouse name: Not on file   Number of children: Not on file   Years of education: Not on file   Highest education level: Not on file  Occupational History    Occupation: umemployed  Tobacco Use   Smoking status: Never   Smokeless tobacco: Never  Vaping Use   Vaping Use: Never used  Substance and Sexual Activity   Alcohol use: No   Drug use: No   Sexual activity: Yes    Birth control/protection: Pill  Other Topics Concern   Not on file  Social History Narrative   Exercises try 3-5 times a week for 30-83min   Social Determinants of Health   Financial Resource Strain: Not on file  Food Insecurity: Not on file  Transportation Needs: Not on file  Physical Activity: Not on file  Stress: Not on file  Social Connections: Not on file  Intimate Partner Violence: Not on file   Social History   Tobacco Use  Smoking Status Never  Smokeless Tobacco Never   Social History   Substance and Sexual Activity  Alcohol Use No    Family History:  Family History  Problem Relation Age of Onset   Heart disease Mother    Mitral valve prolapse Father    Gout Father    Asthma Sister    Hypertension Maternal Grandmother    Cancer Cousin        AML    Past medical history, surgical history, medications, allergies, family history and social history reviewed with patient today and changes made to appropriate areas of the chart.   Review of Systems  Constitutional: Negative.        Has had some breast milk expressed. Stopped breast feeding 3 years ago  HENT: Negative.    Eyes: Negative.   Respiratory:  Positive for wheezing. Negative for cough, hemoptysis, sputum production and shortness of breath.   Cardiovascular: Negative.   Gastrointestinal: Negative.   Genitourinary: Negative.   Musculoskeletal: Negative.   Skin:  Positive for rash. Negative for itching.  Neurological: Negative.   Endo/Heme/Allergies: Negative.   Psychiatric/Behavioral: Negative.    All other ROS negative except what is listed above and in the HPI.      Objective:    BP 105/69    Pulse 80    Temp 98.5 F (36.9 C) (Oral)    Ht 5' 3.4" (1.61 m)    Wt 168 lb 3.2 oz  (76.3 kg)    LMP  (LMP Unknown)    SpO2 100%    BMI 29.42 kg/m   Wt Readings from Last 3 Encounters:  12/05/21 168 lb 3.2 oz (76.3 kg)  11/26/21 167 lb 3.2 oz (75.8 kg)  11/15/21 170 lb (77.1 kg)    Physical Exam Vitals and nursing note reviewed.  Constitutional:      General: She is not in acute distress.    Appearance: Normal appearance. She is not ill-appearing, toxic-appearing or diaphoretic.  HENT:     Head: Normocephalic and atraumatic.  Right Ear: Tympanic membrane, ear canal and external ear normal. There is no impacted cerumen.     Left Ear: Tympanic membrane, ear canal and external ear normal. There is no impacted cerumen.     Nose: Nose normal. No congestion or rhinorrhea.     Mouth/Throat:     Mouth: Mucous membranes are moist.     Pharynx: Oropharynx is clear. No oropharyngeal exudate or posterior oropharyngeal erythema.  Eyes:     General: No scleral icterus.       Right eye: No discharge.        Left eye: No discharge.     Extraocular Movements: Extraocular movements intact.     Conjunctiva/sclera: Conjunctivae normal.     Pupils: Pupils are equal, round, and reactive to light.  Neck:     Vascular: No carotid bruit.  Cardiovascular:     Rate and Rhythm: Normal rate and regular rhythm.     Pulses: Normal pulses.     Heart sounds: No murmur heard.   No friction rub. No gallop.  Pulmonary:     Effort: Pulmonary effort is normal. No respiratory distress.     Breath sounds: Normal breath sounds. No stridor. No wheezing, rhonchi or rales.  Chest:     Chest wall: No tenderness.  Abdominal:     General: Abdomen is flat. Bowel sounds are normal. There is no distension.     Palpations: Abdomen is soft. There is no mass.     Tenderness: There is no abdominal tenderness. There is no right CVA tenderness, left CVA tenderness, guarding or rebound.     Hernia: No hernia is present.  Genitourinary:    Comments: Breast and pelvic exams deferred with shared decision  making Musculoskeletal:        General: No swelling, tenderness, deformity or signs of injury.     Cervical back: Normal range of motion and neck supple. No rigidity. No muscular tenderness.     Right lower leg: No edema.     Left lower leg: No edema.  Lymphadenopathy:     Cervical: No cervical adenopathy.  Skin:    General: Skin is warm and dry.     Capillary Refill: Capillary refill takes less than 2 seconds.     Coloration: Skin is not jaundiced or pale.     Findings: No bruising, erythema, lesion or rash.  Neurological:     General: No focal deficit present.     Mental Status: She is alert and oriented to person, place, and time. Mental status is at baseline.     Cranial Nerves: No cranial nerve deficit.     Sensory: No sensory deficit.     Motor: No weakness.     Coordination: Coordination normal.     Gait: Gait normal.     Deep Tendon Reflexes: Reflexes normal.  Psychiatric:        Mood and Affect: Mood normal.        Behavior: Behavior normal.        Thought Content: Thought content normal.        Judgment: Judgment normal.    Results for orders placed or performed in visit on 12/05/21  Urinalysis, Routine w reflex microscopic  Result Value Ref Range   Specific Gravity, UA 1.015 1.005 - 1.030   pH, UA 5.5 5.0 - 7.5   Color, UA Yellow Yellow   Appearance Ur Clear Clear   Leukocytes,UA Negative Negative   Protein,UA Negative Negative/Trace   Glucose, UA  Negative Negative   Ketones, UA 1+ (A) Negative   RBC, UA Negative Negative   Bilirubin, UA Negative Negative   Urobilinogen, Ur 0.2 0.2 - 1.0 mg/dL   Nitrite, UA Negative Negative      Assessment & Plan:   Problem List Items Addressed This Visit       Cardiovascular and Mediastinum   Migraine    Will check prolactin due to breast milk expression- likely just breast milk. Await results.       Relevant Medications   SUMAtriptan (IMITREX) 100 MG tablet   Other Relevant Orders   Prolactin     Respiratory    Asthma    Under good control on current regimen. Continue current regimen. Continue to monitor. Call with any concerns. Refills given.        Relevant Medications   fluticasone-salmeterol (ADVAIR DISKUS) 250-50 MCG/ACT AEPB   montelukast (SINGULAIR) 10 MG tablet   albuterol (VENTOLIN HFA) 108 (90 Base) MCG/ACT inhaler   Other Visit Diagnoses     Routine general medical examination at a health care facility    -  Primary   Vaccines up to date. Screening labs checked today. Pap up to date. Continue diet and exercise. Call with any concerns. Continue to monitor.    Relevant Orders   CBC with Differential/Platelet   Comprehensive metabolic panel   Lipid Panel w/o Chol/HDL Ratio   Urinalysis, Routine w reflex microscopic (Completed)   TSH   Hepatitis C Antibody   Iron deficiency anemia, unspecified iron deficiency anemia type       Rechecking labs today. Await results. Treat as needed.    Relevant Orders   Ferritin   Iron Binding Cap (TIBC)([facility])        Follow up plan: Return in about 1 year (around 12/05/2022) for physical.   LABORATORY TESTING:  - Pap smear: up to date  IMMUNIZATIONS:   - Tdap: Tetanus vaccination status reviewed: Tdap vaccination indicated and given today. - Influenza: Up to date - Pneumovax: Up to date - Prevnar: Not applicable - COVID: Up to date - HPV: Up to date  PATIENT COUNSELING:   Advised to take 1 mg of folate supplement per day if capable of pregnancy.   Sexuality: Discussed sexually transmitted diseases, partner selection, use of condoms, avoidance of unintended pregnancy  and contraceptive alternatives.   Advised to avoid cigarette smoking.  I discussed with the patient that most people either abstain from alcohol or drink within safe limits (<=14/week and <=4 drinks/occasion for males, <=7/weeks and <= 3 drinks/occasion for females) and that the risk for alcohol disorders and other health effects rises proportionally with  the number of drinks per week and how often a drinker exceeds daily limits.  Discussed cessation/primary prevention of drug use and availability of treatment for abuse.   Diet: Encouraged to adjust caloric intake to maintain  or achieve ideal body weight, to reduce intake of dietary saturated fat and total fat, to limit sodium intake by avoiding high sodium foods and not adding table salt, and to maintain adequate dietary potassium and calcium preferably from fresh fruits, vegetables, and low-fat dairy products.    stressed the importance of regular exercise  Injury prevention: Discussed safety belts, safety helmets, smoke detector, smoking near bedding or upholstery.   Dental health: Discussed importance of regular tooth brushing, flossing, and dental visits.    NEXT PREVENTATIVE PHYSICAL DUE IN 1 YEAR. Return in about 1 year (around 12/05/2022) for physical.

## 2021-12-06 LAB — CBC WITH DIFFERENTIAL/PLATELET
Basophils Absolute: 0 10*3/uL (ref 0.0–0.2)
Basos: 1 %
EOS (ABSOLUTE): 0.1 10*3/uL (ref 0.0–0.4)
Eos: 2 %
Hematocrit: 42.3 % (ref 34.0–46.6)
Hemoglobin: 14.2 g/dL (ref 11.1–15.9)
Immature Grans (Abs): 0 10*3/uL (ref 0.0–0.1)
Immature Granulocytes: 0 %
Lymphocytes Absolute: 1.3 10*3/uL (ref 0.7–3.1)
Lymphs: 21 %
MCH: 32.3 pg (ref 26.6–33.0)
MCHC: 33.6 g/dL (ref 31.5–35.7)
MCV: 96 fL (ref 79–97)
Monocytes Absolute: 0.3 10*3/uL (ref 0.1–0.9)
Monocytes: 5 %
Neutrophils Absolute: 4.6 10*3/uL (ref 1.4–7.0)
Neutrophils: 71 %
Platelets: 291 10*3/uL (ref 150–450)
RBC: 4.39 x10E6/uL (ref 3.77–5.28)
RDW: 12.2 % (ref 11.7–15.4)
WBC: 6.4 10*3/uL (ref 3.4–10.8)

## 2021-12-06 LAB — PROLACTIN: Prolactin: 10.2 ng/mL (ref 4.8–23.3)

## 2021-12-06 LAB — COMPREHENSIVE METABOLIC PANEL
ALT: 45 IU/L — ABNORMAL HIGH (ref 0–32)
AST: 30 IU/L (ref 0–40)
Albumin/Globulin Ratio: 2.1 (ref 1.2–2.2)
Albumin: 4.9 g/dL — ABNORMAL HIGH (ref 3.8–4.8)
Alkaline Phosphatase: 58 IU/L (ref 44–121)
BUN/Creatinine Ratio: 8 — ABNORMAL LOW (ref 9–23)
BUN: 7 mg/dL (ref 6–20)
Bilirubin Total: 0.4 mg/dL (ref 0.0–1.2)
CO2: 21 mmol/L (ref 20–29)
Calcium: 9.8 mg/dL (ref 8.7–10.2)
Chloride: 101 mmol/L (ref 96–106)
Creatinine, Ser: 0.9 mg/dL (ref 0.57–1.00)
Globulin, Total: 2.3 g/dL (ref 1.5–4.5)
Glucose: 88 mg/dL (ref 70–99)
Potassium: 4 mmol/L (ref 3.5–5.2)
Sodium: 140 mmol/L (ref 134–144)
Total Protein: 7.2 g/dL (ref 6.0–8.5)
eGFR: 85 mL/min/{1.73_m2} (ref >59)

## 2021-12-06 LAB — IRON AND TIBC
Iron Saturation: 21 % (ref 15–55)
Iron: 85 ug/dL (ref 27–159)
Total Iron Binding Capacity: 396 ug/dL (ref 250–450)
UIBC: 311 ug/dL (ref 131–425)

## 2021-12-06 LAB — LIPID PANEL W/O CHOL/HDL RATIO
Cholesterol, Total: 187 mg/dL (ref 100–199)
HDL: 56 mg/dL (ref >39)
LDL Chol Calc (NIH): 111 mg/dL — ABNORMAL HIGH (ref 0–99)
Triglycerides: 112 mg/dL (ref 0–149)
VLDL Cholesterol Cal: 20 mg/dL (ref 5–40)

## 2021-12-06 LAB — FERRITIN: Ferritin: 453 ng/mL — ABNORMAL HIGH (ref 15–150)

## 2021-12-06 LAB — TSH: TSH: 1.15 u[IU]/mL (ref 0.450–4.500)

## 2021-12-06 LAB — HEPATITIS C ANTIBODY: Hep C Virus Ab: <0.1 s/co ratio (ref 0.0–0.9)

## 2021-12-09 ENCOUNTER — Other Ambulatory Visit: Payer: Self-pay | Admitting: Family Medicine

## 2021-12-10 NOTE — Telephone Encounter (Signed)
Refilled 12/05/2021 #90 1 refill. Requested Prescriptions  Pending Prescriptions Disp Refills   montelukast (SINGULAIR) 10 MG tablet [Pharmacy Med Name: MONTELUKAST SOD 10 MG TABLET] 90 tablet 1    Sig: TAKE 1 TABLET BY MOUTH EVERYDAY AT BEDTIME     Pulmonology:  Leukotriene Inhibitors Passed - 12/09/2021 11:06 AM      Passed - Valid encounter within last 12 months    Recent Outpatient Visits          5 days ago Routine general medical examination at a health care facility   [facility], [place] P, DO   2 weeks ago Acute bilateral low back pain without sciatica   [place], Megan P, DO   3 weeks ago Acute bilateral low back pain without sciatica   [place], Jolene T, NP   1 month ago Moderate persistent asthma with exacerbation   [facility] [place], Lauren A, NP   9 months ago Acute non-recurrent maxillary sinusitis   [facility] [place], [place], DO      Future Appointments            In 12 months Wynetta Emery, Barb Merino, DO [employer]

## 2021-12-14 DIAGNOSIS — R0989 Other specified symptoms and signs involving the circulatory and respiratory systems: Secondary | ICD-10-CM | POA: Diagnosis not present

## 2021-12-14 DIAGNOSIS — R490 Dysphonia: Secondary | ICD-10-CM | POA: Diagnosis not present

## 2021-12-14 DIAGNOSIS — Z20822 Contact with and (suspected) exposure to covid-19: Secondary | ICD-10-CM | POA: Diagnosis not present

## 2021-12-14 DIAGNOSIS — R051 Acute cough: Secondary | ICD-10-CM | POA: Diagnosis not present

## 2021-12-24 ENCOUNTER — Encounter: Payer: Self-pay | Admitting: Family

## 2021-12-24 ENCOUNTER — Ambulatory Visit (INDEPENDENT_AMBULATORY_CARE_PROVIDER_SITE_OTHER): Payer: BC Managed Care – PPO | Admitting: Family Medicine

## 2021-12-24 ENCOUNTER — Encounter: Payer: Self-pay | Admitting: Family Medicine

## 2021-12-24 ENCOUNTER — Other Ambulatory Visit: Payer: Self-pay

## 2021-12-24 VITALS — BP 114/79 | HR 112 | Temp 98.3°F | Wt 166.0 lb

## 2021-12-24 DIAGNOSIS — J4521 Mild intermittent asthma with (acute) exacerbation: Secondary | ICD-10-CM | POA: Diagnosis not present

## 2021-12-24 MED ORDER — METHYLPREDNISOLONE 4 MG PO TBPK: ORAL_TABLET | ORAL | 0 refills | Status: DC

## 2021-12-24 MED ORDER — ALBUTEROL SULFATE (2.5 MG/3ML) 0.083% IN NEBU
2.5000 mg | INHALATION_SOLUTION | Freq: Once | RESPIRATORY_TRACT | Status: AC
  Administered 2021-12-24: 2.5 mg via RESPIRATORY_TRACT

## 2021-12-24 NOTE — Progress Notes (Signed)
BP 114/79    Pulse (!) 112    Temp 98.3 F (36.8 C)    Wt 166 lb (75.3 kg)    LMP  (LMP Unknown)    SpO2 98%    BMI 29.04 kg/m    Subjective:    Patient ID: Karla Huber, female    DOB: 05-07-1986, 36 y.o.   MRN: [id number]  HPI: Karla Huber is a 36 y.o. female  Chief Complaint  Patient presents with   Asthma    Patient states she went to UC on 12/14/21 for an asthma flare up, was diagnosed with pneumonia. Patient was prescribed antibiotics and steroids, has completed medications. Patient still feels like she hears some crackles in her chest.    UPPER RESPIRATORY TRACT INFECTION Duation; about 2 weeks Worst symptom: cough and wheezing Fever: no Cough: yes Shortness of breath: yes Wheezing: yes Chest pain: no Chest tightness: yes Chest congestion: no Nasal congestion: no Runny nose: no Post nasal drip: no Sneezing: no Sore throat: no Swollen glands: no Sinus pressure: no Headache: no Face pain: no Toothache: no Ear pain: no  Ear pressure: no  Eyes red/itching:no Eye drainage/crusting: no  Vomiting: no Rash: no Fatigue: yes Sick contacts: yes Strep contacts: no  Context: better Recurrent sinusitis: no Relief with OTC cold/cough medications: no  Treatments attempted: doxycycline and medrol dosepack   Relevant past medical, surgical, family and social history reviewed and updated as indicated. Interim medical history since our last visit reviewed. Allergies and medications reviewed and updated.  Review of Systems  Constitutional: Negative.   HENT: Negative.    Respiratory:  Positive for cough, chest tightness, shortness of breath and wheezing. Negative for apnea, choking and stridor.   Cardiovascular: Negative.   Gastrointestinal: Negative.   Psychiatric/Behavioral: Negative.     Per HPI unless specifically indicated above     Objective:    BP 114/79    Pulse (!) 112    Temp 98.3 F (36.8 C)    Wt 166 lb (75.3 kg)    LMP  (LMP Unknown)    SpO2  98%    BMI 29.04 kg/m   Wt Readings from Last 3 Encounters:  12/24/21 166 lb (75.3 kg)  12/05/21 168 lb 3.2 oz (76.3 kg)  11/26/21 167 lb 3.2 oz (75.8 kg)    Physical Exam Vitals and nursing note reviewed.  Constitutional:      General: She is not in acute distress.    Appearance: Normal appearance. She is not ill-appearing, toxic-appearing or diaphoretic.  HENT:     Head: Normocephalic and atraumatic.     Right Ear: External ear normal.     Left Ear: External ear normal.     Nose: Nose normal.     Mouth/Throat:     Mouth: Mucous membranes are moist.     Pharynx: Oropharynx is clear.  Eyes:     General: No scleral icterus.       Right eye: No discharge.        Left eye: No discharge.     Extraocular Movements: Extraocular movements intact.     Conjunctiva/sclera: Conjunctivae normal.     Pupils: Pupils are equal, round, and reactive to light.  Cardiovascular:     Rate and Rhythm: Normal rate and regular rhythm.     Pulses: Normal pulses.     Heart sounds: Normal heart sounds. No murmur heard.   No friction rub. No gallop.  Pulmonary:     Effort: Pulmonary effort  is normal. No respiratory distress.     Breath sounds: No stridor. Wheezing present. No rhonchi or rales.  Chest:     Chest wall: No tenderness.  Musculoskeletal:        General: Normal range of motion.     Cervical back: Normal range of motion and neck supple.  Skin:    General: Skin is warm and dry.     Capillary Refill: Capillary refill takes less than 2 seconds.     Coloration: Skin is not jaundiced or pale.     Findings: No bruising, erythema, lesion or rash.  Neurological:     General: No focal deficit present.     Mental Status: She is alert and oriented to person, place, and time. Mental status is at baseline.  Psychiatric:        Mood and Affect: Mood normal.        Behavior: Behavior normal.        Thought Content: Thought content normal.        Judgment: Judgment normal.    Results for orders  placed or performed in visit on 12/05/21  HM PAP SMEAR  Result Value Ref Range   HM Pap smear see resutls in chart       Assessment & Plan:   Problem List Items Addressed This Visit       Respiratory   Asthma - Primary   Relevant Medications   methylPREDNISolone (MEDROL DOSEPAK) 4 MG TBPK tablet     Follow up plan: No follow-ups on file.

## 2021-12-25 ENCOUNTER — Encounter: Payer: Self-pay | Admitting: Family

## 2021-12-26 ENCOUNTER — Encounter: Payer: Self-pay | Admitting: Family Medicine

## 2021-12-28 ENCOUNTER — Ambulatory Visit (INDEPENDENT_AMBULATORY_CARE_PROVIDER_SITE_OTHER): Payer: BC Managed Care – PPO | Admitting: Nurse Practitioner

## 2021-12-28 ENCOUNTER — Ambulatory Visit (HOSPITAL_BASED_OUTPATIENT_CLINIC_OR_DEPARTMENT_OTHER)
Admission: RE | Admit: 2021-12-28 | Discharge: 2021-12-28 | Disposition: A | Discharge status: Home / Self Care | Payer: BC Managed Care – PPO | Source: Ambulatory Visit | Attending: Nurse Practitioner | Admitting: Nurse Practitioner

## 2021-12-28 ENCOUNTER — Encounter: Payer: Self-pay | Admitting: Nurse Practitioner

## 2021-12-28 ENCOUNTER — Other Ambulatory Visit: Payer: Self-pay

## 2021-12-28 VITALS — BP 125/80 | HR 97 | Temp 98.3°F | Ht 63.4 in | Wt 165.4 lb

## 2021-12-28 DIAGNOSIS — R059 Cough, unspecified: Secondary | ICD-10-CM | POA: Diagnosis not present

## 2021-12-28 DIAGNOSIS — J4521 Mild intermittent asthma with (acute) exacerbation: Secondary | ICD-10-CM | POA: Insufficient documentation

## 2021-12-28 DIAGNOSIS — J4520 Mild intermittent asthma, uncomplicated: Secondary | ICD-10-CM | POA: Diagnosis not present

## 2021-12-28 DIAGNOSIS — R0781 Pleurodynia: Secondary | ICD-10-CM | POA: Diagnosis not present

## 2021-12-28 DIAGNOSIS — J45901 Unspecified asthma with (acute) exacerbation: Secondary | ICD-10-CM | POA: Diagnosis not present

## 2021-12-28 MED ORDER — IPRATROPIUM-ALBUTEROL 0.5-2.5 (3) MG/3ML IN SOLN
3.0000 mL | RESPIRATORY_TRACT | 1 refills | Status: DC | PRN
Start: 2021-12-28 — End: 2022-10-04

## 2021-12-28 MED ORDER — IPRATROPIUM-ALBUTEROL 0.5-2.5 (3) MG/3ML IN SOLN
3.0000 mL | Freq: Once | RESPIRATORY_TRACT | Status: AC
  Administered 2021-12-28: 3 mL via RESPIRATORY_TRACT

## 2021-12-28 MED ORDER — BENZONATATE 100 MG PO CAPS: 100.0000 mg | ORAL_CAPSULE | Freq: Three times a day (TID) | ORAL | 0 refills | Status: DC | PRN

## 2021-12-28 MED ORDER — AMOXICILLIN-POT CLAVULANATE 875-125 MG PO TABS: 1.0000 | ORAL_TABLET | Freq: Two times a day (BID) | ORAL | 0 refills | Status: AC

## 2021-12-28 MED ORDER — METHOCARBAMOL 500 MG PO TABS: 500.0000 mg | ORAL_TABLET | Freq: Three times a day (TID) | ORAL | 0 refills | Status: DC | PRN

## 2021-12-28 NOTE — Assessment & Plan Note (Signed)
Acute exacerbation ongoing, almost 4 weeks present.  At this time will obtain imaging, ordered.  Start Augmentin BID for 7 days and order Duoneb to use at home + Tessalon.  Order for Robaxin for muscle pain from coughing.  Will also provide Duoneb in office today.  Has had multiple rounds of steroids, will avoid repeat unless needed.  Plan for return in 2 weeks for follow-up. ?

## 2021-12-28 NOTE — Patient Instructions (Signed)
Asthma, Adult ?Asthma is a long-term (chronic) condition in which the airways get tight and narrow. The airways are the breathing passages that lead from the nose and mouth down into the lungs. A person with asthma will have times when symptoms get worse. These are called asthma attacks. They can cause coughing, whistling sounds when you breathe (wheezing), shortness of breath, and chest pain. They can make it hard to breathe. There is no cure for asthma, but medicines and lifestyle changes can help control it. ?There are many things that can bring on an asthma attack or make asthma symptoms worse (triggers). Common triggers include: ?Mold. ?Dust. ?Cigarette smoke. ?Cockroaches. ?Things that can cause allergy symptoms (allergens). These include animal skin flakes (dander) and pollen from trees or grass. ?Things that pollute the air. These may include household cleaners, wood smoke, smog, or chemical odors. ?Cold air, weather changes, and wind. ?Crying or laughing hard. ?Stress. ?Certain medicines or drugs. ?Certain foods such as dried fruit, potato chips, and grape juice. ?Infections, such as a cold or the flu. ?Certain medical conditions or diseases. ?Exercise or tiring activities. ?Asthma may be treated with medicines and by staying away from the things that cause asthma attacks. Types of medicines may include: ?Controller medicines. These help prevent asthma symptoms. They are usually taken every day. ?Fast-acting reliever or rescue medicines. These quickly relieve asthma symptoms. They are used as needed and provide short-term relief. ?Allergy medicines if your attacks are brought on by allergens. ?Medicines to help control the body's defense (immune) system. ?Follow these instructions at home: ?Avoiding triggers in your home ?Change your heating and air conditioning filter often. ?Limit your use of fireplaces and wood stoves. ?Get rid of pests (such as roaches and mice) and their droppings. ?Throw away plants  if you see mold on them. ?Clean your floors. Dust regularly. Use cleaning products that do not smell. ?Have someone vacuum when you are not home. Use a vacuum cleaner with a HEPA filter if possible. ?Replace carpet with wood, tile, or vinyl flooring. Carpet can trap animal skin flakes and dust. ?Use allergy-proof pillows, mattress covers, and box spring covers. ?Wash bed sheets and blankets every week in hot water. Dry them in a dryer. ?Keep your bedroom free of any triggers. ?Avoid pets and keep windows closed when things that cause allergy symptoms are in the air. ?Use blankets that are made of polyester or cotton. ?Clean bathrooms and kitchens with bleach. If possible, have someone repaint the walls in these rooms with mold-resistant paint. Keep out of the rooms that are being cleaned and painted. ?Wash your hands often with soap and water. If soap and water are not available, use hand sanitizer. ?Do not allow anyone to smoke in your home. ?General instructions ?Take over-the-counter and prescription medicines only as told by your doctor. ?Talk with your doctor if you have questions about how or when to take your medicines. ?Make note if you need to use your medicines more often than usual. ?Do not use any products that contain nicotine or tobacco, such as cigarettes and e-cigarettes. If you need help quitting, ask your doctor. ?Stay away from secondhand smoke. ?Avoid doing things outdoors when allergen counts are high and when air quality is low. ?Wear a ski mask when doing outdoor activities in the winter. The mask should cover your nose and mouth. Exercise indoors on cold days if you can. ?Warm up before you exercise. Take time to cool down after exercise. ?Use a peak flow meter as   told by your doctor. A peak flow meter is a tool that measures how well the lungs are working. ?Keep track of the peak flow meter's readings. Write them down. ?Follow your asthma action plan. This is a written plan for taking care  of your asthma and treating your attacks. ?Make sure you get all the shots (vaccines) that your doctor recommends. Ask your doctor about a flu shot and a pneumonia shot. ?Keep all follow-up visits as told by your doctor. This is important. ?Contact a doctor if: ?You have wheezing, shortness of breath, or a cough even while taking medicine to prevent attacks. ?The mucus you cough up (sputum) is thicker than usual. ?The mucus you cough up changes from clear or white to yellow, green, gray, or bloody. ?You have problems from the medicine you are taking, such as: ?A rash. ?Itching. ?Swelling. ?Trouble breathing. ?You need reliever medicines more than 2-3 times a week. ?Your peak flow reading is still at 50-79% of your personal best after following the action plan for 1 hour. ?You have a fever. ?Get help right away if: ?You seem to be worse and are not responding to medicine during an asthma attack. ?You are short of breath even at rest. ?You get short of breath when doing very little activity. ?You have trouble eating, drinking, or talking. ?You have chest pain or tightness. ?You have a fast heartbeat. ?Your lips or fingernails start to turn blue. ?You are light-headed or dizzy, or you faint. ?Your peak flow is less than 50% of your personal best. ?You feel too tired to breathe normally. ?Summary ?Asthma is a long-term (chronic) condition in which the airways get tight and narrow. An asthma attack can make it hard to breathe. ?Asthma cannot be cured, but medicines and lifestyle changes can help control it. ?Make sure you understand how to avoid triggers and how and when to use your medicines. ?This information is not intended to replace advice given to you by your health care provider. Make sure you discuss any questions you have with your health care provider. ?Document Revised: 02/06/2020 Document Reviewed: 02/16/2020 ?Elsevier Patient Education ? 2022 [employer] ? ?

## 2021-12-28 NOTE — Progress Notes (Signed)
? ?BP 125/80   Pulse 97   Temp 98.3 ?F (36.8 ?C) (Oral)   Ht 5' 3.4" (1.61 m)   Wt 165 lb 6.4 oz (75 kg)   LMP  (LMP Unknown)   SpO2 99%   BMI 28.93 kg/m?   ? ?Subjective:  ? ? Patient ID: Karla Huber, female    DOB: [birth year], 36 y.o.   MRN: [id number] ? ?HPI: ?Karla Huber is a 36 y.o. female ? ?Chief Complaint  ?Patient presents with  ? Asthma  ?  Patient is here to discuss concerns with he Asthma. Patient states she saw Dr.Johnson on Monday and she was prescribed Steroids and was told her to follow up if the medication has not been working. Patient states she is not feeling any better. Patient states she has pulled a muscle in her left lower side.   ? ?ASTHMA ?Was seen by Dr. Laural Benes on 12/24/21 and provided Medrol pack -- started with symptoms 12/07/21, went to urgent care -- given Doxycycline, Medrol pack, and shot of steroid.  Continues to be wheezing and coughing.   Covid and flu testing has been negative.  Continues on Albuterol and Advair at home + Singulair. ? ?Currently hurting when tries to take deep breath in to left side.  Feels she pulled muscle. ?Asthma status: exacerbated ?Satisfied with current treatment?: yes ?Albuterol/rescue inhaler frequency: 2 times a day ?Dyspnea frequency: none ?Wheezing frequency: increased  ?Cough frequency: ongoing ?Nocturnal symptom frequency: none ?Limitation of activity: no ?Current upper respiratory symptoms: yes ?Last Spirometry:  ?Aerochamber/spacer use: no ?Visits to ER or Urgent Care in past year: yes ?Pneumovax: Up to Date ?Influenza: Up to Date  ? ?Relevant past medical, surgical, family and social history reviewed and updated as indicated. Interim medical history since our last visit reviewed. ?Allergies and medications reviewed and updated. ? ?Review of Systems  ?Constitutional:  Positive for fatigue. Negative for activity change, appetite change, chills and fever.  ?HENT:  Positive for postnasal drip and rhinorrhea. Negative for congestion, ear  discharge, ear pain, facial swelling, sinus pressure, sinus pain, sneezing, sore throat and voice change.   ?Eyes:  Negative for pain and visual disturbance.  ?Respiratory:  Positive for cough, chest tightness, shortness of breath and wheezing.   ?Cardiovascular:  Negative for chest pain, palpitations and leg swelling.  ?Gastrointestinal: Negative.   ?Neurological: Negative.   ?Psychiatric/Behavioral: Negative.    ? ?Per HPI unless specifically indicated above ? ?   ?Objective:  ?  ?BP 125/80   Pulse 97   Temp 98.3 ?F (36.8 ?C) (Oral)   Ht 5' 3.4" (1.61 m)   Wt 165 lb 6.4 oz (75 kg)   LMP  (LMP Unknown)   SpO2 99%   BMI 28.93 kg/m?   ?Wt Readings from Last 3 Encounters:  ?12/28/21 165 lb 6.4 oz (75 kg)  ?12/24/21 166 lb (75.3 kg)  ?12/05/21 168 lb 3.2 oz (76.3 kg)  ?  ?Physical Exam ?Vitals and nursing note reviewed.  ?Constitutional:   ?   General: She is awake. She is not in acute distress. ?   Appearance: She is well-developed and well-groomed. She is not ill-appearing or toxic-appearing.  ?HENT:  ?   Head: Normocephalic.  ?   Right Ear: Hearing, tympanic membrane, ear canal and external ear normal.  ?   Left Ear: Hearing, tympanic membrane, ear canal and external ear normal.  ?   Nose: Rhinorrhea present. Rhinorrhea is clear.  ?   Right Sinus: No maxillary sinus  tenderness or frontal sinus tenderness.  ?   Left Sinus: No maxillary sinus tenderness or frontal sinus tenderness.  ?   Mouth/Throat:  ?   Mouth: Mucous membranes are moist.  ?   Pharynx: Posterior oropharyngeal erythema (with cobblestoning) present. No pharyngeal swelling or oropharyngeal exudate.  ?Eyes:  ?   General: Lids are normal.     ?   Right eye: No discharge.     ?   Left eye: No discharge.  ?   Conjunctiva/sclera: Conjunctivae normal.  ?   Pupils: Pupils are equal, round, and reactive to light.  ?Neck:  ?   Thyroid: No thyromegaly.  ?   Vascular: No carotid bruit.  ?Cardiovascular:  ?   Rate and Rhythm: Normal rate and regular rhythm.   ?   Heart sounds: Normal heart sounds. No murmur heard. ?  No gallop.  ?Pulmonary:  ?   Effort: Pulmonary effort is normal. No accessory muscle usage or respiratory distress.  ?   Breath sounds: Wheezing present. No decreased breath sounds.  ?   Comments: Expiratory wheezes throughout. ?Abdominal:  ?   General: Bowel sounds are normal.  ?   Palpations: Abdomen is soft. There is no hepatomegaly or splenomegaly.  ?Musculoskeletal:  ?   Cervical back: Normal range of motion and neck supple.  ?   Right lower leg: No edema.  ?   Left lower leg: No edema.  ?Lymphadenopathy:  ?   Cervical: No cervical adenopathy.  ?Skin: ?   General: Skin is warm and dry.  ?Neurological:  ?   Mental Status: She is alert and oriented to person, place, and time.  ?Psychiatric:     ?   Attention and Perception: Attention normal.     ?   Mood and Affect: Mood normal.     ?   Speech: Speech normal.     ?   Behavior: Behavior normal. Behavior is cooperative.     ?   Thought Content: Thought content normal.  ? ?Results for orders placed or performed in visit on 12/05/21  ?HM PAP SMEAR  ?Result Value Ref Range  ? HM Pap smear see resutls in chart   ? ?   ?Assessment & Plan:  ? ?Problem List Items Addressed This Visit   ? ?  ? Respiratory  ? Asthma - Primary  ?  Acute exacerbation ongoing, almost 4 weeks present.  At this time will obtain imaging, ordered.  Start Augmentin BID for 7 days and order Duoneb to use at home + Tessalon.  Order for Robaxin for muscle pain from coughing.  Will also provide Duoneb in office today.  Has had multiple rounds of steroids, will avoid repeat unless needed.  Plan for return in 2 weeks for follow-up. ?  ?  ? Relevant Medications  ? ipratropium-albuterol (DUONEB) 0.5-2.5 (3) MG/3ML SOLN  ? ipratropium-albuterol (DUONEB) 0.5-2.5 (3) MG/3ML nebulizer solution 3 mL (Start on 12/28/2021  3:30 PM)  ? Other Relevant Orders  ? DG Chest 2 View  ? For home use only DME Nebulizer machine  ?  ? ?Follow up plan: ?Return in about  2 weeks (around 01/11/2022) for Asthma exacerbation. ? ? ? ? ? ?

## 2021-12-30 NOTE — Progress Notes (Signed)
Contacted via MyChart ? ? ?Good morning Karla Huber, your imaging has returned and shows no acute findings.  No pneumonia.  I reviewed images and report, overall both are good.  Suspect more reactive airway issues from recent illness and your underlying asthma.  Definitely use nebulizer and current treatment sent in.  Any questions?

## 2022-01-07 DIAGNOSIS — J4540 Moderate persistent asthma, uncomplicated: Secondary | ICD-10-CM | POA: Diagnosis not present

## 2022-01-07 DIAGNOSIS — J3089 Other allergic rhinitis: Secondary | ICD-10-CM | POA: Diagnosis not present

## 2022-01-07 DIAGNOSIS — Z91018 Allergy to other foods: Secondary | ICD-10-CM | POA: Diagnosis not present

## 2022-01-07 DIAGNOSIS — H1013 Acute atopic conjunctivitis, bilateral: Secondary | ICD-10-CM | POA: Diagnosis not present

## 2022-01-15 ENCOUNTER — Ambulatory Visit: Payer: BC Managed Care – PPO | Admitting: Family Medicine

## 2022-02-18 DIAGNOSIS — K219 Gastro-esophageal reflux disease without esophagitis: Secondary | ICD-10-CM | POA: Diagnosis not present

## 2022-02-18 DIAGNOSIS — J3089 Other allergic rhinitis: Secondary | ICD-10-CM | POA: Diagnosis not present

## 2022-02-18 DIAGNOSIS — J4540 Moderate persistent asthma, uncomplicated: Secondary | ICD-10-CM | POA: Diagnosis not present

## 2022-02-28 ENCOUNTER — Other Ambulatory Visit: Payer: Self-pay | Admitting: *Deleted

## 2022-03-06 ENCOUNTER — Other Ambulatory Visit: Payer: Self-pay | Admitting: Family Medicine

## 2022-03-06 NOTE — Telephone Encounter (Signed)
Requested Prescriptions  ?Pending Prescriptions Disp Refills  ?? montelukast (SINGULAIR) 10 MG tablet [Pharmacy Med Name: MONTELUKAST SODIUM 10 MG TAB] 90 tablet 1  ?  Sig: TAKE 1 TABLET BY MOUTH EACH NIGHT AT BEDTIME  ?  ? Pulmonology:  Leukotriene Inhibitors Passed - 03/06/2022  6:43 AM  ?  ?  Passed - Valid encounter within last 12 months  ?  Recent Outpatient Visits   ?      ? 2 months ago Mild intermittent asthma with acute exacerbation  ? [facility] [place], [place] T, NP  ? 2 months ago Mild intermittent asthma with acute exacerbation  ? [facility] [place], [place] P, DO  ? 3 months ago Routine general medical examination at a health care facility  ? [facility] [place], [place] P, DO  ? 3 months ago Acute bilateral low back pain without sciatica  ? [facility] [place], [place] P, DO  ? 3 months ago Acute bilateral low back pain without sciatica  ? [facility] [place], Corrie Dandy T, NP  ?  ?  ?Future Appointments   ?        ? In 9 months Dorcas Carrow, DO [facility]  ?  ? ?  ?  ?  ? ? ?

## 2022-03-11 ENCOUNTER — Other Ambulatory Visit: Payer: Self-pay

## 2022-04-15 DIAGNOSIS — L718 Other rosacea: Secondary | ICD-10-CM | POA: Diagnosis not present

## 2022-04-15 DIAGNOSIS — D225 Melanocytic nevi of trunk: Secondary | ICD-10-CM | POA: Diagnosis not present

## 2022-04-15 DIAGNOSIS — D485 Neoplasm of uncertain behavior of skin: Secondary | ICD-10-CM | POA: Diagnosis not present

## 2022-05-20 DIAGNOSIS — J4530 Mild persistent asthma, uncomplicated: Secondary | ICD-10-CM | POA: Diagnosis not present

## 2022-05-20 DIAGNOSIS — L2089 Other atopic dermatitis: Secondary | ICD-10-CM | POA: Diagnosis not present

## 2022-05-20 DIAGNOSIS — H1045 Other chronic allergic conjunctivitis: Secondary | ICD-10-CM | POA: Diagnosis not present

## 2022-05-21 ENCOUNTER — Telehealth: Payer: BC Managed Care – PPO | Admitting: Physician Assistant

## 2022-05-21 DIAGNOSIS — L0100 Impetigo, unspecified: Secondary | ICD-10-CM

## 2022-05-21 MED ORDER — MUPIROCIN 2 % EX OINT: 1.0000 | TOPICAL_OINTMENT | Freq: Three times a day (TID) | CUTANEOUS | 0 refills | Status: AC

## 2022-05-21 NOTE — Progress Notes (Signed)
Message sent to patient requesting further input regarding current symptoms. Awaiting patient response.  

## 2022-05-21 NOTE — Progress Notes (Signed)
E Visit for Rash  We are sorry that you are not feeling well. Here is how we plan to help!  It is harder to tell giving the small size of lesions, especially in the picture, but giving known exposure and sudden onset of these new "spots" there is concern for impetigo as it is quite contagious. I am starting you on a topical antibiotic to use three times daily. Usually if we catch it early we can avoid need for oral antibiotics and longer courses of treatment. Make sure to keep those hands washed and dry.   HOME CARE:  Take cool showers and avoid direct sunlight. Apply cool compress or wet dressings. Take a bath in an oatmeal bath.  Sprinkle content of one Aveeno packet under running faucet with comfortably warm water.  Bathe for 15-20 minutes, 1-2 times daily.  Pat dry with a towel. Do not rub the rash. Use hydrocortisone cream. Take an antihistamine like Benadryl for widespread rashes that itch.  The adult dose of Benadryl is 25-50 mg by mouth 4 times daily. Caution:  This type of medication may cause sleepiness.  Do not drink alcohol, drive, or operate dangerous machinery while taking antihistamines.  Do not take these medications if you have prostate enlargement.  Read package instructions thoroughly on all medications that you take.  GET HELP RIGHT AWAY IF:  Symptoms don't go away after treatment. Severe itching that persists. If you rash spreads or swells. If you rash begins to smell. If it blisters and opens or develops a yellow-brown crust. You develop a fever. You have a sore throat. You become short of breath.  MAKE SURE YOU:  Understand these instructions. Will watch your condition. Will get help right away if you are not doing well or get worse.  Thank you for choosing an e-visit.  Your e-visit answers were reviewed by a board certified advanced clinical practitioner to complete your personal care plan. Depending upon the condition, your plan could have included both over  the counter or prescription medications.  Please review your pharmacy choice. Make sure the pharmacy is open so you can pick up prescription now. If there is a problem, you may contact your provider through [employer] and have the prescription routed to another pharmacy.  Your safety is important to [place]. If you have drug allergies check your prescription carefully.   For the next 24 hours you can use MyChart to ask questions about today's visit, request a non-urgent call back, or ask for a work or school excuse. You will get an email in the next two days asking about your experience. I hope that your e-visit has been valuable and will speed your recovery.

## 2022-05-21 NOTE — Progress Notes (Signed)
I have spent 5 minutes in review of e-visit questionnaire, review and updating patient chart, medical decision making and response to patient.    Cody , PA-C    

## 2022-06-19 DIAGNOSIS — H1045 Other chronic allergic conjunctivitis: Secondary | ICD-10-CM | POA: Diagnosis not present

## 2022-06-19 DIAGNOSIS — J4540 Moderate persistent asthma, uncomplicated: Secondary | ICD-10-CM | POA: Diagnosis not present

## 2022-08-05 DIAGNOSIS — Z23 Encounter for immunization: Secondary | ICD-10-CM | POA: Diagnosis not present

## 2022-08-23 DIAGNOSIS — Z01419 Encounter for gynecological examination (general) (routine) without abnormal findings: Secondary | ICD-10-CM | POA: Diagnosis not present

## 2022-08-23 DIAGNOSIS — Z6830 Body mass index (BMI) 30.0-30.9, adult: Secondary | ICD-10-CM | POA: Diagnosis not present

## 2022-09-06 ENCOUNTER — Telehealth: Payer: BC Managed Care – PPO | Admitting: Physician Assistant

## 2022-09-06 DIAGNOSIS — B370 Candidal stomatitis: Secondary | ICD-10-CM

## 2022-09-06 MED ORDER — NYSTATIN 100000 UNIT/ML MT SUSP: 5.0000 mL | Freq: Four times a day (QID) | OROMUCOSAL | 0 refills | Status: DC

## 2022-09-06 NOTE — Progress Notes (Signed)
  E-Visit for Sore Throat  We are sorry that you are not feeling well.  Here is how we plan to help!  Your symptoms indicate Thrush. We will treat you with Nystatin suspension. Take as directed on package instructions.    Home Care: Only take medications as instructed by your medical team. Do not drink alcohol while taking these medications. A steam or ultrasonic humidifier can help congestion.  You can place a towel over your head and breathe in the steam from hot water coming from a faucet. Avoid close contacts especially the very young and the elderly. Cover your mouth when you cough or sneeze. Always remember to wash your hands.  Get Help Right Away If: You develop worsening fever or throat pain. You develop a severe head ache or visual changes. Your symptoms persist after you have completed your treatment plan.  Make sure you Understand these instructions. Will watch your condition. Will get help right away if you are not doing well or get worse.   Thank you for choosing an e-visit.  Your e-visit answers were reviewed by a board certified advanced clinical practitioner to complete your personal care plan. Depending upon the condition, your plan could have included both over the counter or prescription medications.  Please review your pharmacy choice. Make sure the pharmacy is open so you can pick up prescription now. If there is a problem, you may contact your provider through [employer] and have the prescription routed to another pharmacy.  Your safety is important to [place]. If you have drug allergies check your prescription carefully.   For the next 24 hours you can use MyChart to ask questions about today's visit, request a non-urgent call back, or ask for a work or school excuse. You will get an email in the next two days asking about your experience. I hope that your e-visit has been valuable and will speed your recovery.   I have spent 5 minutes in review of e-visit  questionnaire, review and updating patient chart, medical decision making and response to patient.   Margaretann Loveless, PA-C

## 2022-09-09 DIAGNOSIS — J3089 Other allergic rhinitis: Secondary | ICD-10-CM | POA: Diagnosis not present

## 2022-09-09 DIAGNOSIS — L2089 Other atopic dermatitis: Secondary | ICD-10-CM | POA: Diagnosis not present

## 2022-09-09 DIAGNOSIS — J4540 Moderate persistent asthma, uncomplicated: Secondary | ICD-10-CM | POA: Diagnosis not present

## 2022-09-09 DIAGNOSIS — H1045 Other chronic allergic conjunctivitis: Secondary | ICD-10-CM | POA: Diagnosis not present

## 2022-09-20 ENCOUNTER — Telehealth: Payer: BC Managed Care – PPO | Admitting: Physician Assistant

## 2022-09-20 DIAGNOSIS — B370 Candidal stomatitis: Secondary | ICD-10-CM | POA: Diagnosis not present

## 2022-09-20 MED ORDER — FLUCONAZOLE 150 MG PO TABS: 150.0000 mg | ORAL_TABLET | Freq: Once | ORAL | 0 refills | Status: AC

## 2022-09-20 MED ORDER — NYSTATIN 100000 UNIT/ML MT SUSP: 5.0000 mL | Freq: Four times a day (QID) | OROMUCOSAL | 0 refills | Status: DC

## 2022-09-20 NOTE — Progress Notes (Signed)
E-Visit for Sore Throat   We are sorry that you are not feeling well.  Here is how we plan to help!   Your symptoms indicate Thrush. We will treat you with Nystatin suspension. Take as directed on package instructions. I have also prescribed fluconazole. Take 1 tablet once      Home Care: Only take medications as instructed by your medical team. Do not drink alcohol while taking these medications. A steam or ultrasonic humidifier can help congestion.  You can place a towel over your head and breathe in the steam from hot water coming from a faucet. Avoid close contacts especially the very young and the elderly. Cover your mouth when you cough or sneeze. Always remember to wash your hands.   Get Help Right Away If: You develop worsening fever or throat pain. You develop a severe head ache or visual changes. Your symptoms persist after you have completed your treatment plan.   Make sure you Understand these instructions. Will watch your condition. Will get help right away if you are not doing well or get worse.     Thank you for choosing an e-visit.   Your e-visit answers were reviewed by a board certified advanced clinical practitioner to complete your personal care plan. Depending upon the condition, your plan could have included both over the counter or prescription medications.   Please review your pharmacy choice. Make sure the pharmacy is open so you can pick up prescription now. If there is a problem, you may contact your provider through [employer] and have the prescription routed to another pharmacy.  Your safety is important to [place]. If you have drug allergies check your prescription carefully.    For the next 24 hours you can use MyChart to ask questions about today's visit, request a non-urgent call back, or ask for a work or school excuse. You will get an email in the next two days asking about your experience. I hope that your e-visit has been valuable and will speed  your recovery.  I have spent 5 minutes in review of e-visit questionnaire, review and updating patient chart, medical decision making and response to patient.   Margaretann Loveless, PA-C

## 2022-10-04 ENCOUNTER — Encounter: Payer: Self-pay | Admitting: Family Medicine

## 2022-10-04 ENCOUNTER — Ambulatory Visit (INDEPENDENT_AMBULATORY_CARE_PROVIDER_SITE_OTHER): Payer: BC Managed Care – PPO | Admitting: Family Medicine

## 2022-10-04 ENCOUNTER — Encounter: Payer: Self-pay | Admitting: Family

## 2022-10-04 VITALS — BP 111/71 | HR 91 | Temp 98.6°F | Wt 172.6 lb

## 2022-10-04 DIAGNOSIS — E611 Iron deficiency: Secondary | ICD-10-CM | POA: Diagnosis not present

## 2022-10-04 DIAGNOSIS — B370 Candidal stomatitis: Secondary | ICD-10-CM

## 2022-10-04 DIAGNOSIS — H5789 Other specified disorders of eye and adnexa: Secondary | ICD-10-CM

## 2022-10-04 DIAGNOSIS — D509 Iron deficiency anemia, unspecified: Secondary | ICD-10-CM

## 2022-10-04 DIAGNOSIS — J4521 Mild intermittent asthma with (acute) exacerbation: Secondary | ICD-10-CM | POA: Diagnosis not present

## 2022-10-04 DIAGNOSIS — R739 Hyperglycemia, unspecified: Secondary | ICD-10-CM

## 2022-10-04 DIAGNOSIS — M791 Myalgia, unspecified site: Secondary | ICD-10-CM

## 2022-10-04 MED ORDER — FLUOCINOLONE ACETONIDE 0.01 % EX CREA: TOPICAL_CREAM | Freq: Two times a day (BID) | CUTANEOUS | 0 refills | Status: DC

## 2022-10-04 MED ORDER — FLUCONAZOLE 150 MG PO TABS: 150.0000 mg | ORAL_TABLET | Freq: Every day | ORAL | 1 refills | Status: DC

## 2022-10-04 NOTE — Progress Notes (Signed)
BP 111/71   Pulse 91   Temp 98.6 F (37 C) (Oral)   Wt 172 lb 9.6 oz (78.3 kg)   SpO2 99%   BMI 30.19 kg/m    Subjective:    Patient ID: Karla Huber, female    DOB: 09-05-1986, 36 y.o.   MRN: [id number]  HPI: Karla Huber is a 36 y.o. female  Chief Complaint  Patient presents with   Eye Problem    Pt states she has been noticing puffiness around her eyes since March. States she has been to optometry, allergist, and dermatologist for this as well.    Thrush    Pt states she has been having thrush from her inhaler   Has been having issues with thrush for the past few months. Has been treated with nystatin 2x it seems to go away for short period of time and then it seems to come back. She is noticing that it's acting up again in the past few days and her throat has been sore.   She notes that she has been having swelling above her eyes for about 9 months. She has seen a couple of specialists, but they don't seem to be getting better. She had been doing better with dexamethasone but it came back afterwards. Her opthalmologist recommended that she see a rheumatologist. She is otherwise feeling well with no other concerns or complaints at this time.   Relevant past medical, surgical, family and social history reviewed and updated as indicated. Interim medical history since our last visit reviewed. Allergies and medications reviewed and updated.  Review of Systems  Constitutional: Negative.   HENT: Negative.    Respiratory: Negative.    Cardiovascular: Negative.   Gastrointestinal: Negative.   Musculoskeletal: Negative.   Neurological: Negative.   Psychiatric/Behavioral: Negative.      Per HPI unless specifically indicated above     Objective:    BP 111/71   Pulse 91   Temp 98.6 F (37 C) (Oral)   Wt 172 lb 9.6 oz (78.3 kg)   SpO2 99%   BMI 30.19 kg/m   Wt Readings from Last 3 Encounters:  10/04/22 172 lb 9.6 oz (78.3 kg)  12/28/21 165 lb 6.4 oz (75 kg)   12/24/21 166 lb (75.3 kg)    Physical Exam Vitals and nursing note reviewed.  Constitutional:      General: She is not in acute distress.    Appearance: Normal appearance. She is not ill-appearing, toxic-appearing or diaphoretic.  HENT:     Head: Normocephalic and atraumatic.     Right Ear: External ear normal.     Left Ear: External ear normal.     Nose: Nose normal.     Mouth/Throat:     Mouth: Mucous membranes are moist.     Pharynx: Oropharynx is clear.     Comments: Oral thrush Eyes:     General: No scleral icterus.       Right eye: No discharge.        Left eye: No discharge.     Extraocular Movements: Extraocular movements intact.     Conjunctiva/sclera: Conjunctivae normal.     Pupils: Pupils are equal, round, and reactive to light.     Comments: Swelling over her eyes  Cardiovascular:     Rate and Rhythm: Normal rate and regular rhythm.     Pulses: Normal pulses.     Heart sounds: Normal heart sounds. No murmur heard.    No friction rub. No gallop.  Pulmonary:     Effort: Pulmonary effort is normal. No respiratory distress.     Breath sounds: Normal breath sounds. No stridor. No wheezing, rhonchi or rales.  Chest:     Chest wall: No tenderness.  Musculoskeletal:        General: Normal range of motion.     Cervical back: Normal range of motion and neck supple.  Skin:    General: Skin is warm and dry.     Capillary Refill: Capillary refill takes less than 2 seconds.     Coloration: Skin is not jaundiced or pale.     Findings: No bruising, erythema, lesion or rash.  Neurological:     General: No focal deficit present.     Mental Status: She is alert and oriented to person, place, and time. Mental status is at baseline.  Psychiatric:        Mood and Affect: Mood normal.        Behavior: Behavior normal.        Thought Content: Thought content normal.        Judgment: Judgment normal.     Results for orders placed or performed in visit on 12/05/21  HM PAP  SMEAR  Result Value Ref Range   HM Pap smear see resutls in chart       Assessment & Plan:   Problem List Items Addressed This Visit       Respiratory   Asthma   Relevant Medications   BREZTRI AEROSPHERE 160-9-4.8 MCG/ACT AERO   Other Visit Diagnoses     Thrush    -  Primary   Will treat with diflucan. Call with any concerns. Continue to monitor. If not improving- may need to change inhalers.   Relevant Medications   fluconazole (DIFLUCAN) 150 MG tablet   Periorbital swelling       Will treat with topical steroids and check labs to look for rheum issues.   Relevant Orders   RA Qn+CCP(IgG/A)+SjoSSA+SjoSSB   Sed Rate (ESR)   ANA+ENA+DNA/DS+Antich+Centr   CK   Comprehensive metabolic panel   Thyroid Panel With TSH   Iron deficiency anemia, unspecified iron deficiency anemia type       Rechecking labs today. Await results. Treat as needed.   Relevant Orders   CBC with Differential/Platelet   Iron Binding Cap (TIBC)([facility])   Ferritin   Iron deficiency       Rechecking labs today. Await results. Treat as needed.   Relevant Orders   CBC with Differential/Platelet   Iron Binding Cap (TIBC)([facility])   Ferritin   Myalgia       Checking labs today. Await results.   Relevant Orders   VITAMIN D 25 Hydroxy (Vit-D Deficiency, Fractures)   Hyperglycemia       Rechecking labs today. Await results. Treat as needed.   Relevant Orders   Hgb A1c w/o eAG        Follow up plan: Return as scheduled.

## 2022-10-08 ENCOUNTER — Encounter: Payer: Self-pay | Admitting: Family

## 2022-10-08 LAB — THYROID PANEL WITH TSH
Free Thyroxine Index: 2.2 (ref 1.2–4.9)
T3 Uptake Ratio: 21 % — ABNORMAL LOW (ref 24–39)
T4, Total: 10.4 ug/dL (ref 4.5–12.0)
TSH: 1.12 u[IU]/mL (ref 0.450–4.500)

## 2022-10-08 LAB — CBC WITH DIFFERENTIAL/PLATELET
Basophils Absolute: 0 10*3/uL (ref 0.0–0.2)
Basos: 0 %
EOS (ABSOLUTE): 0.2 10*3/uL (ref 0.0–0.4)
Eos: 2 %
Hematocrit: 44.6 % (ref 34.0–46.6)
Hemoglobin: 14.8 g/dL (ref 11.1–15.9)
Immature Grans (Abs): 0 10*3/uL (ref 0.0–0.1)
Immature Granulocytes: 0 %
Lymphocytes Absolute: 1.6 10*3/uL (ref 0.7–3.1)
Lymphs: 23 %
MCH: 32 pg (ref 26.6–33.0)
MCHC: 33.2 g/dL (ref 31.5–35.7)
MCV: 97 fL (ref 79–97)
Monocytes Absolute: 0.4 10*3/uL (ref 0.1–0.9)
Monocytes: 5 %
Neutrophils Absolute: 4.9 10*3/uL (ref 1.4–7.0)
Neutrophils: 70 %
Platelets: 357 10*3/uL (ref 150–450)
RBC: 4.62 x10E6/uL (ref 3.77–5.28)
RDW: 11.7 % (ref 11.7–15.4)
WBC: 7.1 10*3/uL (ref 3.4–10.8)

## 2022-10-08 LAB — RA QN+CCP(IGG/A)+SJOSSA+SJOSSB
Cyclic Citrullin Peptide Ab: <1 units (ref 0–19)
Rheumatoid fact SerPl-aCnc: <10 IU/mL (ref <14.0)

## 2022-10-08 LAB — COMPREHENSIVE METABOLIC PANEL
ALT: 30 IU/L (ref 0–32)
AST: 19 IU/L (ref 0–40)
Albumin/Globulin Ratio: 2 (ref 1.2–2.2)
Albumin: 5 g/dL — ABNORMAL HIGH (ref 3.9–4.9)
Alkaline Phosphatase: 59 IU/L (ref 44–121)
BUN/Creatinine Ratio: 10 (ref 9–23)
BUN: 8 mg/dL (ref 6–20)
Bilirubin Total: 0.4 mg/dL (ref 0.0–1.2)
CO2: 22 mmol/L (ref 20–29)
Calcium: 10.1 mg/dL (ref 8.7–10.2)
Chloride: 103 mmol/L (ref 96–106)
Creatinine, Ser: 0.83 mg/dL (ref 0.57–1.00)
Globulin, Total: 2.5 g/dL (ref 1.5–4.5)
Glucose: 85 mg/dL (ref 70–99)
Potassium: 4.5 mmol/L (ref 3.5–5.2)
Sodium: 141 mmol/L (ref 134–144)
Total Protein: 7.5 g/dL (ref 6.0–8.5)
eGFR: 93 mL/min/{1.73_m2} (ref >59)

## 2022-10-08 LAB — ANA+ENA+DNA/DS+ANTICH+CENTR
ANA Titer 1: NEGATIVE
Anti JO-1: <0.2 AI (ref 0.0–0.9)
Centromere Ab Screen: <0.2 AI (ref 0.0–0.9)
Chromatin Ab SerPl-aCnc: <0.2 AI (ref 0.0–0.9)
ENA RNP Ab: <0.2 AI (ref 0.0–0.9)
ENA SM Ab Ser-aCnc: <0.2 AI (ref 0.0–0.9)
ENA SSA (RO) Ab: <0.2 AI (ref 0.0–0.9)
ENA SSB (LA) Ab: <0.2 AI (ref 0.0–0.9)
Scleroderma (Scl-70) (ENA) Antibody, IgG: <0.2 AI (ref 0.0–0.9)
dsDNA Ab: <1 IU/mL (ref 0–9)

## 2022-10-08 LAB — IRON AND TIBC
Iron Saturation: 22 % (ref 15–55)
Iron: 98 ug/dL (ref 27–159)
Total Iron Binding Capacity: 444 ug/dL (ref 250–450)
UIBC: 346 ug/dL (ref 131–425)

## 2022-10-08 LAB — CK: Total CK: 165 U/L (ref 32–182)

## 2022-10-08 LAB — HGB A1C W/O EAG: Hgb A1c MFr Bld: 5.1 % (ref 4.8–5.6)

## 2022-10-08 LAB — SEDIMENTATION RATE: Sed Rate: 2 mm/hr (ref 0–32)

## 2022-10-08 LAB — VITAMIN D 25 HYDROXY (VIT D DEFICIENCY, FRACTURES): Vit D, 25-Hydroxy: 27.3 ng/mL — ABNORMAL LOW (ref 30.0–100.0)

## 2022-10-08 LAB — FERRITIN: Ferritin: 338 ng/mL — ABNORMAL HIGH (ref 15–150)

## 2022-10-17 ENCOUNTER — Encounter: Payer: Self-pay | Admitting: Family Medicine

## 2022-10-17 ENCOUNTER — Ambulatory Visit (INDEPENDENT_AMBULATORY_CARE_PROVIDER_SITE_OTHER): Payer: BC Managed Care – PPO | Admitting: Family Medicine

## 2022-10-17 VITALS — BP 128/72 | HR 109 | Temp 98.8°F | Wt 171.4 lb

## 2022-10-17 DIAGNOSIS — M9900 Segmental and somatic dysfunction of head region: Secondary | ICD-10-CM | POA: Diagnosis not present

## 2022-10-17 DIAGNOSIS — N644 Mastodynia: Secondary | ICD-10-CM | POA: Diagnosis not present

## 2022-10-17 DIAGNOSIS — M9902 Segmental and somatic dysfunction of thoracic region: Secondary | ICD-10-CM

## 2022-10-17 DIAGNOSIS — M9904 Segmental and somatic dysfunction of sacral region: Secondary | ICD-10-CM

## 2022-10-17 DIAGNOSIS — M9903 Segmental and somatic dysfunction of lumbar region: Secondary | ICD-10-CM

## 2022-10-17 DIAGNOSIS — M9908 Segmental and somatic dysfunction of rib cage: Secondary | ICD-10-CM

## 2022-10-17 DIAGNOSIS — R519 Headache, unspecified: Secondary | ICD-10-CM

## 2022-10-17 DIAGNOSIS — M9905 Segmental and somatic dysfunction of pelvic region: Secondary | ICD-10-CM

## 2022-10-17 DIAGNOSIS — H5789 Other specified disorders of eye and adnexa: Secondary | ICD-10-CM

## 2022-10-17 DIAGNOSIS — M9909 Segmental and somatic dysfunction of abdomen and other regions: Secondary | ICD-10-CM | POA: Diagnosis not present

## 2022-10-17 DIAGNOSIS — M9901 Segmental and somatic dysfunction of cervical region: Secondary | ICD-10-CM

## 2022-10-17 MED ORDER — PREDNISONE 10 MG PO TABS: ORAL_TABLET | ORAL | 0 refills | Status: DC

## 2022-10-17 MED ORDER — CYCLOBENZAPRINE HCL 10 MG PO TABS: 10.0000 mg | ORAL_TABLET | Freq: Every day | ORAL | 3 refills | Status: DC

## 2022-10-17 MED ORDER — PREDNISONE 10 MG PO TABS: 10.0000 mg | ORAL_TABLET | Freq: Every day | ORAL | 0 refills | Status: DC

## 2022-10-17 MED ORDER — TRIAMCINOLONE ACETONIDE 40 MG/ML IJ SUSP
40.0000 mg | Freq: Once | INTRAMUSCULAR | Status: AC
  Administered 2022-10-17: 40 mg via INTRAMUSCULAR

## 2022-10-17 NOTE — Progress Notes (Signed)
BP 128/72   Pulse (!) 109   Temp 98.8 F (37.1 C) (Oral)   Wt 171 lb 6.4 oz (77.7 kg)   SpO2 100%   BMI 29.98 kg/m    Subjective:    Patient ID: Karla Huber, female    DOB: Aug 28, 1986, 36 y.o.   MRN: [id number]  HPI: Karla Huber is a 36 y.o. female  Chief Complaint  Patient presents with   Headache   Karla Huber presents today for evaluation and possible treatment with OMT. She has been having headaches at the back of her head. She notes that it radiates into her neck and upper shoulders. It's tight and aching in nature. Worse with being in the lights and having her eyes swell. She notes that her eyes have also been irritated and she has been having swelling around her eyes again. Nothing is making it better or worse. She has seen dermatology, ophthalmology without benefit. Labs last visit for ?rheumatologic issue were negative. She notes that her head is better with being in the dark and lying down. She is otherwise doing OK. Both her ears are feeling full and swollen.  She also notes that she has had issues with shooting pains in her breast and down her R arm. She saw GYN and they thought it may be an irritated nerve. She is asking about work up. No other concerns or complaints at this time.   Relevant past medical, surgical, family and social history reviewed and updated as indicated. Interim medical history since our last visit reviewed. Allergies and medications reviewed and updated.  Review of Systems  Constitutional: Negative.   HENT:  Positive for congestion, ear pain, facial swelling and sinus pressure. Negative for dental problem, drooling, ear discharge, hearing loss, mouth sores, nosebleeds, postnasal drip, rhinorrhea, sinus pain, sneezing, sore throat, tinnitus, trouble swallowing and voice change.   Eyes:  Positive for photophobia and pain. Negative for discharge, redness, itching and visual disturbance.  Respiratory: Negative.    Cardiovascular: Negative.    Gastrointestinal: Negative.   Musculoskeletal:  Positive for back pain, myalgias, neck pain and neck stiffness. Negative for arthralgias, gait problem and joint swelling.  Neurological:  Positive for headaches. Negative for dizziness, tremors, seizures, syncope, facial asymmetry, speech difficulty, weakness, light-headedness and numbness.  Psychiatric/Behavioral: Negative.      Per HPI unless specifically indicated above     Objective:    BP 128/72   Pulse (!) 109   Temp 98.8 F (37.1 C) (Oral)   Wt 171 lb 6.4 oz (77.7 kg)   SpO2 100%   BMI 29.98 kg/m   Wt Readings from Last 3 Encounters:  10/17/22 171 lb 6.4 oz (77.7 kg)  10/04/22 172 lb 9.6 oz (78.3 kg)  12/28/21 165 lb 6.4 oz (75 kg)    Physical Exam Vitals and nursing note reviewed.  Constitutional:      General: She is not in acute distress.    Appearance: Normal appearance. She is not ill-appearing.  HENT:     Head: Normocephalic and atraumatic.     Right Ear: External ear normal.     Left Ear: External ear normal.     Nose: Nose normal.     Mouth/Throat:     Mouth: Mucous membranes are moist.     Pharynx: Oropharynx is clear.  Eyes:     Extraocular Movements: Extraocular movements intact.     Conjunctiva/sclera: Conjunctivae normal.     Pupils: Pupils are equal, round, and reactive to light.  Neck:     Vascular: No carotid bruit.  Cardiovascular:     Rate and Rhythm: Normal rate.     Pulses: Normal pulses.  Pulmonary:     Effort: Pulmonary effort is normal. No respiratory distress.  Abdominal:     General: Abdomen is flat. There is no distension.     Palpations: Abdomen is soft. There is no mass.     Tenderness: There is no abdominal tenderness. There is no right CVA tenderness, left CVA tenderness, guarding or rebound.     Hernia: No hernia is present.  Musculoskeletal:     Cervical back: No muscular tenderness.  Lymphadenopathy:     Cervical: No cervical adenopathy.  Skin:    General: Skin is warm  and dry.     Capillary Refill: Capillary refill takes less than 2 seconds.     Coloration: Skin is not jaundiced or pale.     Findings: No bruising, erythema, lesion or rash.  Neurological:     General: No focal deficit present.     Mental Status: She is alert. Mental status is at baseline.  Psychiatric:        Mood and Affect: Mood normal.        Behavior: Behavior normal.        Thought Content: Thought content normal.        Judgment: Judgment normal.    Musculoskeletal:  Exam found Decreased ROM, Tissue texture changes, Tenderness to palpation, and Asymmetry of patient's  head, neck, thorax, ribs, lumbar, pelvis, sacrum, and abdomen Osteopathic Structural Exam:   Head: SBS compression, OAESSR  Neck: C4ESRR, C5ESRL  Thorax: T2-5SLRR  Ribs: Ribs 6-9 locked up on the R, Rib 8 locked up on the L  Lumbar: QL hypertonic on the R  Pelvis: Posterior R innominate  Sacrum: R on R torsion  Abdomen: Diaphragm hypertonic on the R  Results for orders placed or performed in visit on 10/04/22  RA Qn+CCP(IgG/A)+SjoSSA+SjoSSB  Result Value Ref Range   Rhuematoid fact SerPl-aCnc <10.0 <66.0 IU/mL   Cyclic Citrullin Peptide Ab <1 0 - 19 units  Sed Rate (ESR)  Result Value Ref Range   Sed Rate 2 0 - 32 mm/hr  ANA+ENA+DNA/DS+Antich+Centr  Result Value Ref Range   ANA Titer 1 Negative    dsDNA Ab <1 0 - 9 IU/mL   ENA RNP Ab <0.2 0.0 - 0.9 AI   ENA SM Ab Ser-aCnc <0.2 0.0 - 0.9 AI   Scleroderma (Scl-70) (ENA) Antibody, IgG <0.2 0.0 - 0.9 AI   ENA SSA (RO) Ab <0.2 0.0 - 0.9 AI   ENA SSB (LA) Ab <0.2 0.0 - 0.9 AI   Chromatin Ab SerPl-aCnc <0.2 0.0 - 0.9 AI   Anti JO-1 <0.2 0.0 - 0.9 AI   Centromere Ab Screen <0.2 0.0 - 0.9 AI   See below: Comment   CK  Result Value Ref Range   Total CK 165 32 - 182 U/L  CBC with Differential/Platelet  Result Value Ref Range   WBC 7.1 3.4 - 10.8 x10E3/uL   RBC 4.62 3.77 - 5.28 x10E6/uL   Hemoglobin 14.8 11.1 - 15.9 g/dL   Hematocrit 44.6 34.0 - 46.6 %    MCV 97 79 - 97 fL   MCH 32.0 26.6 - 33.0 pg   MCHC 33.2 31.5 - 35.7 g/dL   RDW 11.7 11.7 - 15.4 %   Platelets 357 150 - 450 x10E3/uL   Neutrophils 70 Not Estab. %   Lymphs  23 Not Estab. %   Monocytes 5 Not Estab. %   Eos 2 Not Estab. %   Basos 0 Not Estab. %   Neutrophils Absolute 4.9 1.4 - 7.0 x10E3/uL   Lymphocytes Absolute 1.6 0.7 - 3.1 x10E3/uL   Monocytes Absolute 0.4 0.1 - 0.9 x10E3/uL   EOS (ABSOLUTE) 0.2 0.0 - 0.4 x10E3/uL   Basophils Absolute 0.0 0.0 - 0.2 x10E3/uL   Immature Granulocytes 0 Not Estab. %   Immature Grans (Abs) 0.0 0.0 - 0.1 x10E3/uL  Comprehensive metabolic panel  Result Value Ref Range   Glucose 85 70 - 99 mg/dL   BUN 8 6 - 20 mg/dL   Creatinine, Ser 0.83 0.57 - 1.00 mg/dL   eGFR 93 >59 mL/min/1.73   BUN/Creatinine Ratio 10 9 - 23   Sodium 141 134 - 144 mmol/L   Potassium 4.5 3.5 - 5.2 mmol/L   Chloride 103 96 - 106 mmol/L   CO2 22 20 - 29 mmol/L   Calcium 10.1 8.7 - 10.2 mg/dL   Total Protein 7.5 6.0 - 8.5 g/dL   Albumin 5.0 (H) 3.9 - 4.9 g/dL   Globulin, Total 2.5 1.5 - 4.5 g/dL   Albumin/Globulin Ratio 2.0 1.2 - 2.2   Bilirubin Total 0.4 0.0 - 1.2 mg/dL   Alkaline Phosphatase 59 44 - 121 IU/L   AST 19 0 - 40 IU/L   ALT 30 0 - 32 IU/L  VITAMIN D 25 Hydroxy (Vit-D Deficiency, Fractures)  Result Value Ref Range   Vit D, 25-Hydroxy 27.3 (L) 30.0 - 100.0 ng/mL  Iron Binding Cap (TIBC)([facility])  Result Value Ref Range   Total Iron Binding Capacity 444 250 - 450 ug/dL   UIBC 346 131 - 425 ug/dL   Iron 98 27 - 159 ug/dL   Iron Saturation 22 15 - 55 %  Ferritin  Result Value Ref Range   Ferritin 338 (H) 15 - 150 ng/mL  Hgb A1c w/o eAG  Result Value Ref Range   Hgb A1c MFr Bld 5.1 4.8 - 5.6 %  Thyroid Panel With TSH  Result Value Ref Range   TSH 1.120 0.450 - 4.500 uIU/mL   T4, Total 10.4 4.5 - 12.0 ug/dL   T3 Uptake Ratio 21 (L) 24 - 39 %   Free Thyroxine Index 2.2 1.2 - 4.9      Assessment & Plan:   Problem List Items  Addressed This Visit   None Visit Diagnoses     Acute nonintractable headache, unspecified headache type    -  Primary   She does have somatic dysfunction that is contributing to her symptoms. Treated today with good results as below. Call with any concerns. Continue to monitor.   Relevant Medications   cyclobenzaprine (FLEXERIL) 10 MG tablet   Periorbital swelling       Will treat with prednisone. Call with any concerns. Continue to monitor.   Relevant Medications   triamcinolone acetonide (KENALOG-40) injection 40 mg (Completed)   Breast pain       Concern for radiculopathy- will get her x-rays. Await results.   Relevant Orders   DG Thoracic Spine W/Swimmers   DG Cervical Spine Complete   Head region somatic dysfunction       Cervical segment dysfunction       Thoracic segment dysfunction       Somatic dysfunction of lumbar region       Somatic dysfunction of sacral region       Somatic dysfunction of pelvis  region       Rib cage region somatic dysfunction       Segmental dysfunction of abdomen           After verbal consent was obtained, patient was treated today with osteopathic manipulative medicine to the regions of the head, neck, thorax, ribs, lumbar, pelvis, sacrum, and abdomen using the techniques of cranial, myofascial release, counterstrain, muscle energy, HVLA, and soft tissue. Areas of compensation relating to her primary pain source also treated. Patient tolerated the procedure well with good objective and good subjective improvement in symptoms. She left the room in good condition. She was advised to stay well hydrated and that she may have some soreness following the procedure. If not improving or worsening, she will call and come in. Home exercise program of stretches for neck and shoulder discussed and demonstrated today. Patient will do these stretches BID to before the point of pain, and will return for reevaluation  on a PRN basis.  Follow up plan: Return if  symptoms worsen or fail to improve.

## 2022-10-21 IMAGING — DX DG CHEST 2V
2 series · 2 of 2 positions shown · non-contrast
Comparison: Radiograph 01/19/2021

CLINICAL DATA: Mild intermittent asthma with acute exacerbation.
Ongoing cough. Asthma. Cough for almost 1 month with lower left rib
pain.

EXAM:
CHEST - 2 VIEW

[chest pa]
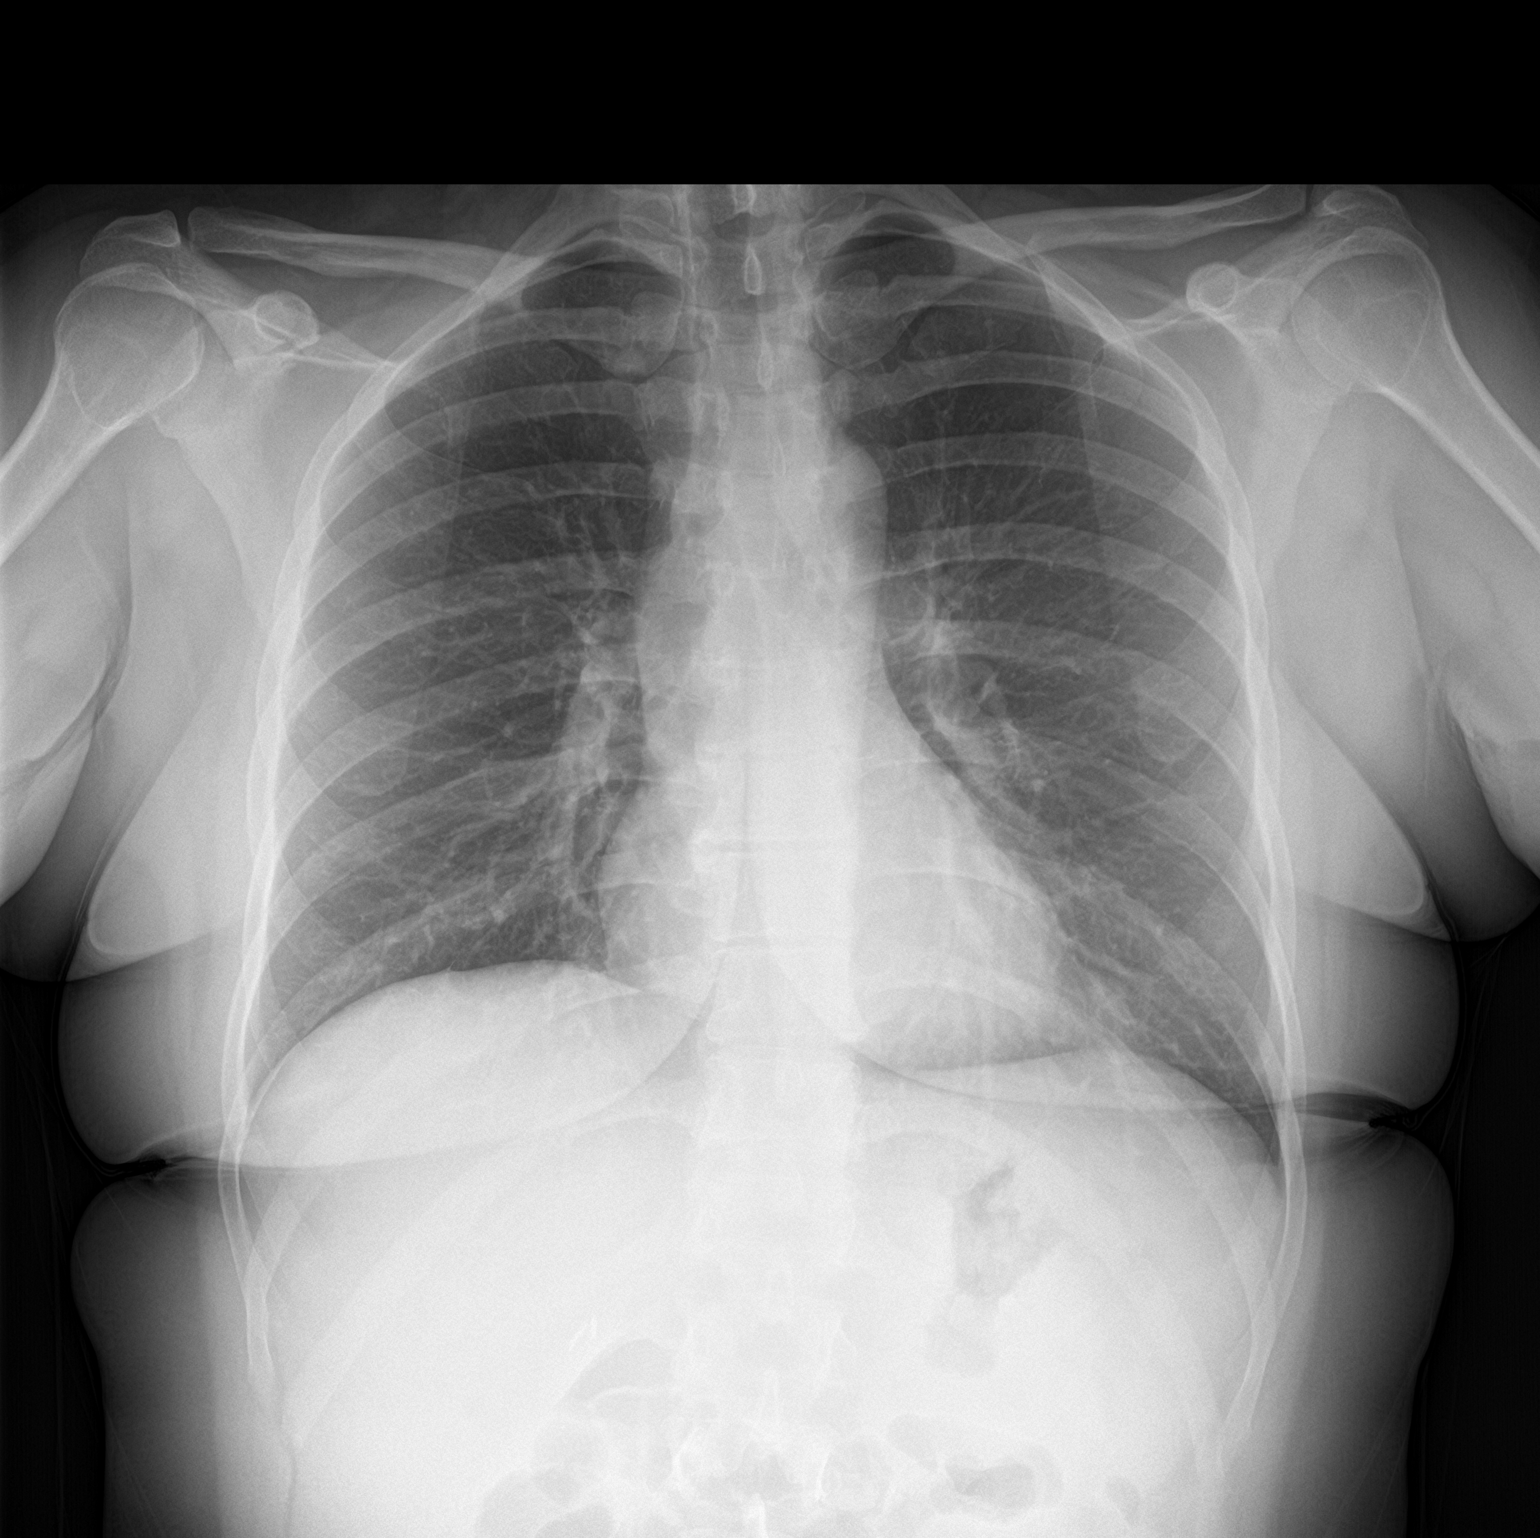

[chest lat]
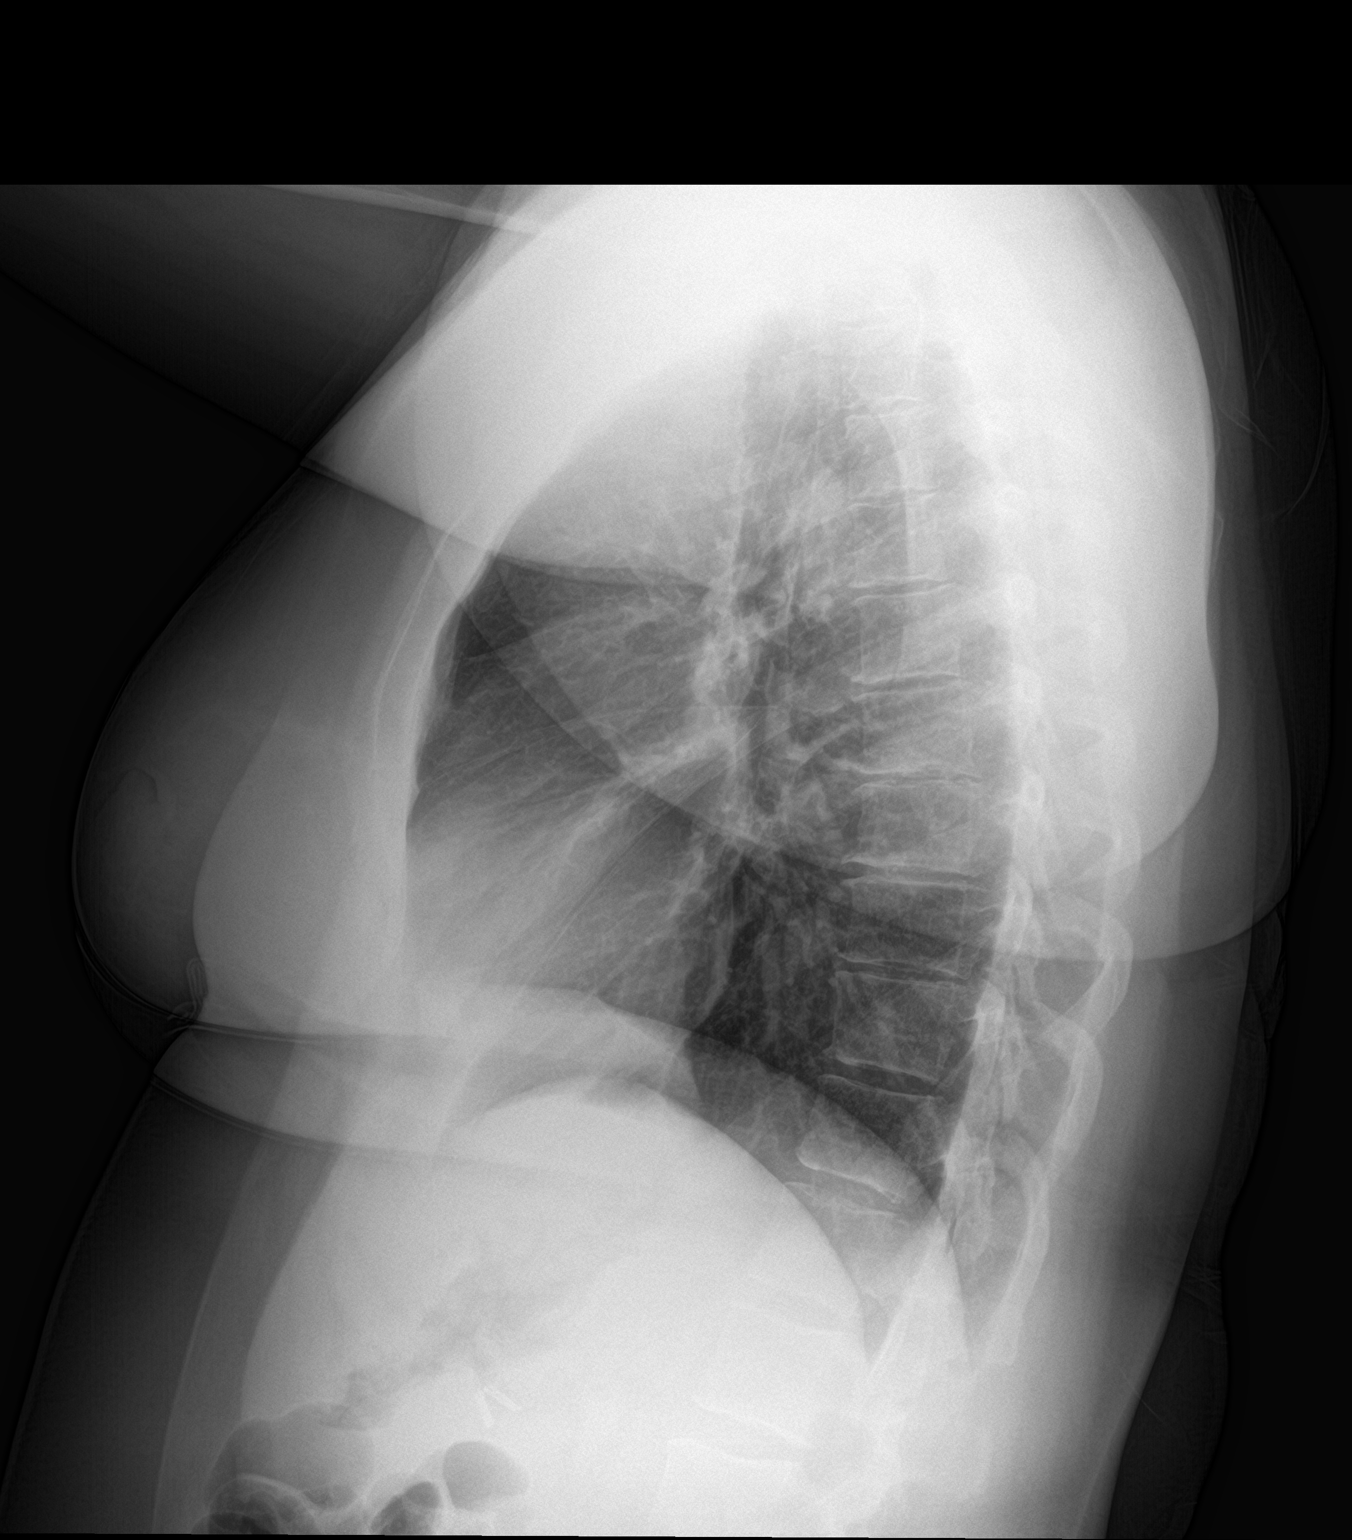

[2 of 2 positions shown; findings below may reference images not displayed]

FINDINGS: The cardiomediastinal contours are normal. The lungs are clear.
Pulmonary vasculature is normal. No consolidation, pleural effusion,
or pneumothorax. No visualized left rib fracture. No acute osseous
abnormalities are seen.
IMPRESSION: Negative radiographs of the chest.

## 2022-10-22 ENCOUNTER — Encounter: Payer: Self-pay | Admitting: Family

## 2022-10-23 ENCOUNTER — Ambulatory Visit (HOSPITAL_BASED_OUTPATIENT_CLINIC_OR_DEPARTMENT_OTHER)
Admission: RE | Admit: 2022-10-23 | Discharge: 2022-10-23 | Disposition: A | Discharge status: Home / Self Care | Payer: BC Managed Care – PPO | Source: Ambulatory Visit | Attending: Family Medicine | Admitting: Family Medicine

## 2022-10-23 DIAGNOSIS — N644 Mastodynia: Secondary | ICD-10-CM

## 2022-10-23 DIAGNOSIS — M546 Pain in thoracic spine: Secondary | ICD-10-CM | POA: Diagnosis not present

## 2022-10-23 DIAGNOSIS — M47812 Spondylosis without myelopathy or radiculopathy, cervical region: Secondary | ICD-10-CM | POA: Diagnosis not present

## 2022-10-24 ENCOUNTER — Encounter: Payer: Self-pay | Admitting: Family Medicine

## 2022-10-24 DIAGNOSIS — M4722 Other spondylosis with radiculopathy, cervical region: Secondary | ICD-10-CM

## 2022-10-30 ENCOUNTER — Encounter: Payer: Self-pay | Admitting: Family

## 2022-11-01 ENCOUNTER — Encounter: Payer: Self-pay | Admitting: Family

## 2022-11-05 DIAGNOSIS — M542 Cervicalgia: Secondary | ICD-10-CM | POA: Diagnosis not present

## 2022-11-05 DIAGNOSIS — M4722 Other spondylosis with radiculopathy, cervical region: Secondary | ICD-10-CM | POA: Diagnosis not present

## 2022-11-06 NOTE — Progress Notes (Unsigned)
Karla Huber, female    DOB: 1986-07-20   MRN: [id number]   Brief patient profile:  37 yowf  never smoker grew up [place] ear infections eval by [facility] > dx asthma/polyps age 37 rx Advair 22 and did great under care of Hicks/ Bobbit until Dec 2022 on symbicort 160 then  flu and worse until breztri  rx  3/223 since referred to pulmonary clinic 11/07/2022 by [facility] for asthma f/u     History of Present Illness  11/07/2022  Pulmonary/ 1st office eval/ and breztri  Chief Complaint  Patient presents with   Consult    Needs new provider for asthma.  Hx of wheeze and SOB with flu 09/2022.  Pt states doing well today.  Dyspnea:  no problem with eliptical / no problem with dog including  Cough: none now / constant pnds  Sleep: flat bed/ one pillow  SABA use: none  Has intermittent hb   No obvious day to day or daytime pattern/variability or assoc excess/ purulent sputum or mucus plugs or hemoptysis or cp or chest tightness, subjective wheeze or overt sinus symptoms.   Sleeping  without nocturnal  or early am exacerbation  of respiratory  c/o's or need for noct saba. Also denies any obvious fluctuation of symptoms with weather or environmental changes or other aggravating or alleviating factors except as outlined above   No unusual exposure hx or h/o childhood pna/ asthma or knowledge of premature birth.  Current Allergies, Complete Past Medical History, Past Surgical History, Family History, and Social History were reviewed in [employer] record.  ROS  The following are not active complaints unless bolded Hoarseness, sore throat, dysphagia, dental problems, itching, sneezing,  nasal congestion or discharge of excess mucus or purulent secretions, ear ache,   fever, chills, sweats, unintended wt loss or wt gain, classically pleuritic or exertional cp,  orthopnea pnd or arm/hand swelling  or leg swelling, presyncope, palpitations, abdominal pain,  anorexia, nausea, vomiting, diarrhea  or change in bowel habits or change in bladder habits, change in stools or change in urine, dysuria, hematuria,  rash, arthralgias, visual complaints, headache, numbness, weakness or ataxia or problems with walking or coordination,  change in mood or  memory.           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 1 each 6   BREZTRI AEROSPHERE 160-9-4.8 MCG/ACT AERO Inhale 2 puffs into the lungs 2 (two) times daily.     Carbinoxamine Maleate 4 MG TABS Take 1 tablet by mouth in the morning and at bedtime.     cyclobenzaprine (FLEXERIL) 10 MG tablet Take 1 tablet (10 mg total) by mouth  at bedtime. 30 tablet 3   ipratropium (ATROVENT) 0.06 % nasal spray Place 2 sprays into both nostrils 2 (two) times daily.     montelukast (SINGULAIR) 10 MG tablet TAKE 1 TABLET BY MOUTH EACH NIGHT AT BEDTIME 90 tablet 2   fluconazole (DIFLUCAN) 150 MG tablet Take 1 tablet (150 mg total) by mouth daily. (Patient not taking: Reported on 11/07/2022) 7 tablet 1   fluocinolone (VANOS) 0.01 % cream Apply topically 2 (two) times daily. (Patient not taking: Reported on 11/07/2022) 30 g 0              0       Objective:      BP 122/68 (BP Location: Left Arm, Patient Position: Sitting, Cuff Size: Normal)   Pulse 100   Temp 98.7 F (37.1 C) (Oral)   Ht 5\' 4"  (1.626 m)   Wt 175 lb (79.4 kg)   SpO2 99%   BMI 30.04 kg/m   SpO2: 99 %  Amb pleasant amb wf nad    HEENT : Oropharynx  clear     Nasal turbinates nl/ no polyps viz    NECK :  without  apparent JVD/ palpable Nodes/TM    LUNGS: no acc muscle use,  Nl contour chest which is clear to A and P bilaterally without cough on insp or exp maneuvers   CV:  RRR  no s3 or murmur or increase in P2, and no edema   ABD:  soft and nontender with nl inspiratory excursion in the supine position. No bruits or organomegaly appreciated   MS:  Nl gait/ ext warm without deformities Or obvious joint restrictions  calf tenderness, cyanosis or clubbing    SKIN: warm and dry without lesions    NEURO:  alert, approp, nl sensorium with  no motor or cerebellar deficits apparent.    I personally reviewed images and agree with radiology impression as follows:  CXR:   pa and lateral 12/28/21 Negative radiographs of the chest.       Assessment   Asthma Onset age 37 dx [facility]/ pos polyp hx never resected/ advil triggers  - Singulair dep per pt hx 11/07/2022  - 11/07/2022 try step down from breztri to symbicort 160 with gerd rx as "Plan A"   All goals of chronic asthma control met including optimal function and elimination of symptoms with minimal need for rescue therapy onbreztri and singulair but probably over treated at present   Contingencies discussed in full including contacting this office immediately if not controlling the symptoms using the rule of two's with addition of gerd rx for flares    F/u in 6 weeks with all meds in hand using a trust but verify approach to confirm accurate Medication  Reconciliation The principal here is that until we are certain that the  patients are doing what we've asked, it makes no sense to ask them to do more.           Each maintenance medication was reviewed in detail including emphasizing most importantly the difference between maintenance and prns and under what circumstances the prns are to be triggered using an action plan format where appropriate.  Total time for H and P, chart review, counseling, reviewing hfa device(s) and generating customized AVS unique to this office visit / same day charting  > 30 min new pt eval           Christinia Gully, MD 11/07/2022

## 2022-11-07 ENCOUNTER — Encounter: Payer: Self-pay | Admitting: Internal Medicine

## 2022-11-07 ENCOUNTER — Ambulatory Visit (INDEPENDENT_AMBULATORY_CARE_PROVIDER_SITE_OTHER): Payer: BC Managed Care – PPO | Admitting: Internal Medicine

## 2022-11-07 VITALS — BP 122/68 | HR 100 | Temp 98.7°F | Ht 64.0 in | Wt 175.0 lb

## 2022-11-07 DIAGNOSIS — J4521 Mild intermittent asthma with (acute) exacerbation: Secondary | ICD-10-CM

## 2022-11-07 LAB — POCT EXHALED NITRIC OXIDE: FeNO level (ppb): 13

## 2022-11-07 MED ORDER — BUDESONIDE-FORMOTEROL FUMARATE 160-4.5 MCG/ACT IN AERO: INHALATION_SPRAY | RESPIRATORY_TRACT | 12 refills | Status: DC

## 2022-11-07 NOTE — Assessment & Plan Note (Addendum)
Onset age 37 dx [facility]/ pos polyp hx never resected/ advil triggers  - Singulair dep per pt hx 11/07/2022  - 11/07/2022 try step down from breztri to symbicort 160 with gerd rx as "Plan A"  - FENO  11/07/2022  = 13   All goals of chronic asthma control met including optimal function and elimination of symptoms with minimal need for rescue therapy onbreztri and singulair but probably over treated at present   Contingencies discussed in full including contacting this office immediately if not controlling the symptoms using the rule of two's with addition of gerd rx for flares    F/u in 6 weeks with all meds in hand using a trust but verify approach to confirm accurate Medication  Reconciliation The principal here is that until we are certain that the  patients are doing what we've asked, it makes no sense to ask them to do more.          Each maintenance medication was reviewed in detail including emphasizing most importantly the difference between maintenance and prns and under what circumstances the prns are to be triggered using an action plan format where appropriate.  Total time for H and P, chart review, counseling, reviewing hfa device(s) and generating customized AVS unique to this office visit / same day charting  > 30 min new pt eval

## 2022-11-07 NOTE — Patient Instructions (Addendum)
Plan A = Automatic = Always=   Symbicort 160 Take 2 puffs first thing in am and then another 2 puffs about 12 hours later and singulair 10 mg daily    Work on inhaler technique:  relax and gently blow all the way out then take a nice smooth full deep breath back in, triggering the inhaler a split second before you start breathing in thru spacer.  Hold breath in for at least  5 seconds if you can. Blow out symbicort  thru nose. Rinse and gargle with water when done.  If mouth or throat bother you at all,  try brushing teeth/gums/tongue with arm and hammer toothpaste/ make a slurry and gargle and spit out.      Plan B = Backup (to supplement plan A, not to replace it) Only use your albuterol inhaler as a rescue medication to be used if you can't catch your breath by resting or doing a relaxed purse lip breathing pattern.  - The less you use it, the better it will work when you need it. - Ok to use the inhaler up to 2 puffs  every 4 hours if you must but call for appointment if use goes up over your usual need - Don't leave home without it !!  (think of it like the spare tire for your car)   Plan C = Crisis (instead of Plan B but only if Plan B stops working) - only use your albuterol nebulizer if you first try Plan B and it fails to help > ok to use the nebulizer up to every 4 hours but if start needing it regularly call for immediate appointment  Plan D = ABC not working great: Try prilosec otc 20mg   Take 30-60 min before first meal of the day and Pepcid ac (famotidine) 20 mg one @  bedtime until all better  for at least a week without the need for cough suppression  Please schedule a follow up office visit in 6 weeks, call sooner if needed - bring inhalers/spacer

## 2022-11-10 ENCOUNTER — Telehealth: Payer: BC Managed Care – PPO | Admitting: Family Medicine

## 2022-11-10 DIAGNOSIS — N3000 Acute cystitis without hematuria: Secondary | ICD-10-CM | POA: Diagnosis not present

## 2022-11-10 MED ORDER — SULFAMETHOXAZOLE-TRIMETHOPRIM 800-160 MG PO TABS: 1.0000 | ORAL_TABLET | Freq: Two times a day (BID) | ORAL | 0 refills | Status: AC

## 2022-11-10 NOTE — Progress Notes (Signed)
E-Visit for Urinary Problems  We are sorry that you are not feeling well.  Here is how we plan to help!  Based on what you shared with me it looks like you most likely have a simple urinary tract infection.  A UTI (Urinary Tract Infection) is a bacterial infection of the bladder.  Most cases of urinary tract infections are simple to treat but a key part of your care is to encourage you to drink plenty of fluids and watch your symptoms carefully.  I have prescribed Bactrim DS One tablet twice a day for 5 days.  Your symptoms should gradually improve. Call us if the burning in your urine worsens, you develop worsening fever, back pain or pelvic pain or if your symptoms do not resolve after completing the antibiotic.  Urinary tract infections can be prevented by drinking plenty of water to keep your body hydrated.  Also be sure when you wipe, wipe from front to back and don't hold it in!  If possible, empty your bladder every 4 hours.  HOME CARE Drink plenty of fluids Compete the full course of the antibiotics even if the symptoms resolve Remember, when you need to go.go. Holding in your urine can increase the likelihood of getting a UTI! GET HELP RIGHT AWAY IF: You cannot urinate You get a high fever Worsening back pain occurs You see blood in your urine You feel sick to your stomach or throw up You feel like you are going to pass out  MAKE SURE YOU  Understand these instructions. Will watch your condition. Will get help right away if you are not doing well or get worse.   Thank you for choosing an e-visit.  Your e-visit answers were reviewed by a board certified advanced clinical practitioner to complete your personal care plan. Depending upon the condition, your plan could have included both over the counter or prescription medications.  Please review your pharmacy choice. Make sure the pharmacy is open so you can pick up prescription now. If there is a problem, you may contact  your provider through MyChart messaging and have the prescription routed to another pharmacy.  Your safety is important to us. If you have drug allergies check your prescription carefully.   For the next 24 hours you can use MyChart to ask questions about today's visit, request a non-urgent call back, or ask for a work or school excuse. You will get an email in the next two days asking about your experience. I hope that your e-visit has been valuable and will speed your recovery.    have provided 5 minutes of non face to face time during this encounter for chart review and documentation.   

## 2022-11-14 DIAGNOSIS — M542 Cervicalgia: Secondary | ICD-10-CM | POA: Diagnosis not present

## 2022-11-14 DIAGNOSIS — M4722 Other spondylosis with radiculopathy, cervical region: Secondary | ICD-10-CM | POA: Diagnosis not present

## 2022-11-15 ENCOUNTER — Encounter: Payer: Self-pay | Admitting: Family Medicine

## 2022-11-21 DIAGNOSIS — L718 Other rosacea: Secondary | ICD-10-CM | POA: Diagnosis not present

## 2022-11-21 DIAGNOSIS — H0100A Unspecified blepharitis right eye, upper and lower eyelids: Secondary | ICD-10-CM | POA: Diagnosis not present

## 2022-11-21 DIAGNOSIS — H0100B Unspecified blepharitis left eye, upper and lower eyelids: Secondary | ICD-10-CM | POA: Diagnosis not present

## 2022-11-22 ENCOUNTER — Encounter: Payer: Self-pay | Admitting: Family

## 2022-11-24 NOTE — Telephone Encounter (Signed)
I can see him for just OMM. OK to schedule in 40 min slot

## 2022-11-25 ENCOUNTER — Encounter: Payer: Self-pay | Admitting: Family

## 2022-11-29 DIAGNOSIS — M542 Cervicalgia: Secondary | ICD-10-CM | POA: Diagnosis not present

## 2022-11-29 DIAGNOSIS — M4722 Other spondylosis with radiculopathy, cervical region: Secondary | ICD-10-CM | POA: Diagnosis not present

## 2022-12-05 ENCOUNTER — Encounter: Payer: BC Managed Care – PPO | Admitting: Family Medicine

## 2022-12-17 DIAGNOSIS — M4722 Other spondylosis with radiculopathy, cervical region: Secondary | ICD-10-CM | POA: Diagnosis not present

## 2022-12-17 DIAGNOSIS — M542 Cervicalgia: Secondary | ICD-10-CM | POA: Diagnosis not present

## 2022-12-19 ENCOUNTER — Ambulatory Visit (INDEPENDENT_AMBULATORY_CARE_PROVIDER_SITE_OTHER): Payer: BC Managed Care – PPO | Admitting: Internal Medicine

## 2022-12-19 ENCOUNTER — Encounter: Payer: Self-pay | Admitting: Internal Medicine

## 2022-12-19 VITALS — BP 100/58 | HR 94 | Temp 97.7°F | Ht 64.0 in | Wt 171.2 lb

## 2022-12-19 DIAGNOSIS — J302 Other seasonal allergic rhinitis: Secondary | ICD-10-CM | POA: Diagnosis not present

## 2022-12-19 DIAGNOSIS — J4521 Mild intermittent asthma with (acute) exacerbation: Secondary | ICD-10-CM | POA: Diagnosis not present

## 2022-12-19 MED ORDER — CARBINOXAMINE MALEATE 4 MG PO TABS: 1.0000 | ORAL_TABLET | Freq: Two times a day (BID) | ORAL | 11 refills | Status: DC

## 2022-12-19 MED ORDER — IPRATROPIUM BROMIDE 0.06 % NA SOLN: 2.0000 | Freq: Two times a day (BID) | NASAL | 11 refills | Status: DC

## 2022-12-19 MED ORDER — AIRSUPRA 90-80 MCG/ACT IN AERO: 2.0000 | INHALATION_SPRAY | RESPIRATORY_TRACT | 11 refills | Status: DC | PRN

## 2022-12-19 NOTE — Patient Instructions (Signed)
Change plan B top AirSupra up to 2 puffs every hours if needed   Please schedule a follow up visit in 3 months but call sooner if needed

## 2022-12-19 NOTE — Assessment & Plan Note (Signed)
Assoc with polyps age 37 rx Advair 36 and did great under care of Hicks/ Bobbit until Dec 2022   Well controlled on antihistamines/ atrovent NS and singulair s evidence of recurrence  No change rx needed   F/u q 3 m         Each maintenance medication was reviewed in detail including emphasizing most importantly the difference between maintenance and prns and under what circumstances the prns are to be triggered using an action plan format where appropriate.  Total time for H and P, chart review, counseling, reviewing hfa/nasal device(s) and generating customized AVS unique to this office visit / same day charting = 30 min

## 2022-12-19 NOTE — Progress Notes (Signed)
Karla Huber, female    DOB: [birth year]   MRN: [id number]   Brief patient profile:  74 yowf  never smoker grew up [place] ear infections eval by [facility] > dx asthma/polyps age 37 rx Advair 36 and did great under care of Hicks/ Bobbit until Dec 2022 on symbicort 160 then  flu and worse until breztri  rx  3/223 since referred to pulmonary clinic 11/07/2022 by [facility] Liter for asthma f/u     History of Present Illness  11/07/2022  Pulmonary/ 1st office eval/ and breztri  Chief Complaint  Patient presents with   Consult    Needs new provider for asthma.  Hx of wheeze and SOB with flu 09/2022.  Pt states doing well today.  Dyspnea:  no problem with eliptical / no problem with dog including  Cough: none now / constant pnds  Sleep: flat bed/ one pillow  SABA use: none  Has intermittent hb  Rec Plan A = Automatic = Always=   Symbicort 160 Take 2 puffs first thing in am and then another 2 puffs about 12 hours later and singulair 10 mg daily  Work on inhaler technique:  Plan B = Backup (to supplement plan A, not to replace it) Only use your albuterol inhaler as a rescue medication  Plan C = Crisis (instead of Plan B but only if Plan B stops working) - only use your albuterol nebulizer if you first try Plan B and it fails to help > ok to use the nebulizer up to every 4 hours but if start needing it regularly call for immediate appointment Plan D = ABC not working great: Try prilosec otc 60m  Take 30-60 min before first meal of the day and Pepcid ac (famotidine) 20 mg one @  bedtime until all better  for at least a week without the need for cough suppression  Please schedule a follow up office visit in 6 weeks, call sooner if needed - bring inhalers/spacer      12/19/2022  f/u ov/ re: asthma/ polyps    maint on symbiocrt 160/singulair  Chief Complaint  Patient presents with   Follow-up    Doing well. Discuss plan if respiratory flare.  Dyspnea:  Not limited by breathing  from desired activities  /indoor aerobics  Cough: none  Sleeping: no resp rx  SABA use: rare  02: none  Covid status:   mid jan 2023 infected      No obvious day to day or daytime variability or assoc excess/ purulent sputum or mucus plugs or hemoptysis or cp or chest tightness, subjective wheeze or overt sinus or hb symptoms.   Sleeping  without nocturnal  or early am exacerbation  of respiratory  c/o's or need for noct saba. Also denies any obvious fluctuation of symptoms with weather or environmental changes or other aggravating or alleviating factors except as outlined above   No unusual exposure hx or h/o childhood pna/ asthma or knowledge of premature birth.  Current Allergies, Complete Past Medical History, Past Surgical History, Family History, and Social History were reviewed in [employer].  ROS  The following are not active complaints unless bolded Hoarseness, sore throat, dysphagia, dental problems, itching, sneezing,  nasal congestion or discharge of excess mucus or purulent secretions, ear ache,   fever, chills, sweats, unintended wt loss or wt gain, classically pleuritic or exertional cp,  orthopnea pnd or arm/hand swelling  or leg swelling, presyncope, palpitations, abdominal pain, anorexia,  nausea, vomiting, diarrhea  or change in bowel habits or change in bladder habits, change in stools or change in urine, dysuria, hematuria,  rash, arthralgias, visual complaints, headache, numbness, weakness or ataxia or problems with walking or coordination,  change in mood or  memory.        Current Meds  Medication Sig   albuterol (VENTOLIN HFA) 108 (90 Base) MCG/ACT inhaler Inhale 1-2 puffs into the lungs every 4 (four) hours as needed for wheezing or shortness of breath.   budesonide-formoterol (SYMBICORT) 160-4.5 MCG/ACT inhaler Take 2 puffs first thing in am and then another 2 puffs about 12 hours later.   Carbinoxamine Maleate 4 MG TABS Take 1 tablet  by mouth in the morning and at bedtime.   cyclobenzaprine (FLEXERIL) 10 MG tablet Take 1 tablet (10 mg total) by mouth at bedtime.   ipratropium (ATROVENT) 0.06 % nasal spray Place 2 sprays into both nostrils 2 (two) times daily.   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   Menometrorrhagia 12/08/2017   Postpartum care following cesarean delivery (4/30) 02/25/2015   Postpartum care following repeat cesarean delivery (1/16) 11/13/2016   Preeclampsia    Traumatic injury during pregnancy in third trimester 02/09/2015          Objective:     Wt Readings from Last 3 Encounters:  12/19/22 171 lb 3.2 oz (77.7 kg)  11/07/22 175 lb (79.4 kg)  10/17/22 171 lb 6.4 oz (77.7 kg)      Vital signs reviewed  12/19/2022  - Note at rest 02 sats  98% on RA   General appearance:    amb wf nad    HEENT : Oropharynx  c;ear     Nasal turbinates nl / no polyps viz   NECK :  without  apparent JVD/ palpable Nodes/TM    LUNGS: no acc muscle use,  Nl contour chest which is clear to A and P bilaterally without cough on insp or exp maneuvers   CV:  RRR  no s3 or murmur or increase in P2, and no edema   ABD:  soft and nontender     SKIN: warm and dry without lesions    NEURO:  alert, approp, nl sensorium with  no motor or cerebellar deficits apparent.         Assessment

## 2022-12-19 NOTE — Assessment & Plan Note (Signed)
Onset age 37 dx [facility]/ pos polyp hx never resected/ advil triggers  - Singulair dep per pt hx 11/07/2022  - 11/07/2022 try step down from breztri to symbicort 160 with gerd rx as "Plan A"  - FENO  11/07/2022  = 13  - 12/19/2022  After extensive coaching inhaler device,  effectiveness =    90% > change plan B to air supra   All goals of chronic asthma control met including optimal function and elimination of symptoms with minimal need for rescue therapy.  Contingencies discussed in full including contacting this office immediately if not controlling the symptoms using the rule of two's.

## 2022-12-24 ENCOUNTER — Telehealth: Payer: Self-pay

## 2022-12-24 NOTE — Telephone Encounter (Signed)
PA request received via CMM for Airsupra 90-80MCG/ACT aerosol  PA has been submitted to [facility] and is pending determination.  Key: Karla Huber

## 2023-01-01 ENCOUNTER — Encounter: Payer: Self-pay | Admitting: Internal Medicine

## 2023-01-01 NOTE — Telephone Encounter (Signed)
I will have the drug rep call the pharmacy as it should not have been a problem and they probably just put the savings care thru wrong - she did use one, right???

## 2023-01-01 NOTE — Telephone Encounter (Signed)
Called and spoke with pt. Pt said she has not been able to use the savings card yet that was given to her for the Airsupra due to the whole united [employer] mess that is currently going on. Pt said the pharmacy has not been able to run the savings card yet but was told that they might have another way to be able to get this fixed.  Pt said she was going to head out now to go to the pharmacy to see if they are going to be able to finally get this worked out for her. Pt said she is going to send [place] an update on this via mychart message after she goes to the pharmacy. Routing to Dr. Melvyn Novas.

## 2023-01-01 NOTE — Telephone Encounter (Signed)
PA has been DENIED, no additional information has been provided at this time.

## 2023-01-01 NOTE — Telephone Encounter (Signed)
Routing to Dr. Melvyn Novas as an Juluis Rainier.

## 2023-01-02 ENCOUNTER — Other Ambulatory Visit: Payer: Self-pay

## 2023-01-02 MED ORDER — BUDESONIDE-FORMOTEROL FUMARATE 80-4.5 MCG/ACT IN AERO: 2.0000 | INHALATION_SPRAY | Freq: Two times a day (BID) | RESPIRATORY_TRACT | 12 refills | Status: DC

## 2023-01-02 NOTE — Telephone Encounter (Signed)
FYI - update from phone note.

## 2023-01-02 NOTE — Telephone Encounter (Signed)
Let's try symbicort 80 generic Take up to 2 puffs q12 h prn

## 2023-01-14 NOTE — Telephone Encounter (Signed)
Sorry for the confusion but the higher strengths of inhaled steroids can cause cough and symbicort 80 is much closer to what I wanted her to have (Airfsupra is 30) so that's the best alternative if it works for her / her insurance

## 2023-01-14 NOTE — Telephone Encounter (Signed)
Pt called the office trying to figure out why Dr. Melvyn Novas was wanting to drop her down to the lower dose of generic Symbicort when she was not able to get the Airsupra.  Pt said MW had recently switched her from breztri to generic Symbicort 160 but then after she could not get the Airsupra due to the issues with the copay cards, she went to the pharmacy and found out that Rx was sent in for generic Symbicort 80 for her to take two puffs bid prn.   Dr. Melvyn Novas, please advise on this for pt. Pt is wanting to know if she should get the Rx for generic Symbicort 80 which was just sent in or if she should remain on the prior  Rx of Symbicort 160 after being taken off of Breztri.  Pt said if she should be on generic Symbicort 160, a new Rx will need to be sent to [facility].  Dr. Melvyn Novas, please advise on this for pt.

## 2023-01-15 ENCOUNTER — Telehealth: Payer: Self-pay | Admitting: Internal Medicine

## 2023-01-15 DIAGNOSIS — L719 Rosacea, unspecified: Secondary | ICD-10-CM | POA: Diagnosis not present

## 2023-01-15 NOTE — Telephone Encounter (Signed)
Karla Huber checking on dosage for generic Symbicort. Karla Huber phone number is [phone].

## 2023-01-15 NOTE — Telephone Encounter (Signed)
Verified instructions with pharmacy. She advised patient was able to get Airsupra for free. Went over message from Dr. Melvyn Novas. Nothing further needed.

## 2023-01-15 NOTE — Telephone Encounter (Signed)
I would prefer the airsupra and no symbicort on a trial basis but because the note says unable to get airfsupra then this is the best alternative and is only on a trial basis - if worse then go back to the symbicort 160

## 2023-01-23 ENCOUNTER — Ambulatory Visit: Payer: BC Managed Care – PPO | Admitting: Family Medicine

## 2023-01-28 ENCOUNTER — Ambulatory Visit (INDEPENDENT_AMBULATORY_CARE_PROVIDER_SITE_OTHER): Payer: BC Managed Care – PPO | Admitting: Family Medicine

## 2023-01-28 ENCOUNTER — Encounter: Payer: Self-pay | Admitting: Family Medicine

## 2023-01-28 VITALS — BP 107/69 | HR 85 | Temp 98.8°F | Ht 64.0 in | Wt 173.6 lb

## 2023-01-28 DIAGNOSIS — M9905 Segmental and somatic dysfunction of pelvic region: Secondary | ICD-10-CM

## 2023-01-28 DIAGNOSIS — Z0000 Encounter for general adult medical examination without abnormal findings: Secondary | ICD-10-CM | POA: Diagnosis not present

## 2023-01-28 DIAGNOSIS — M542 Cervicalgia: Secondary | ICD-10-CM | POA: Diagnosis not present

## 2023-01-28 DIAGNOSIS — M9902 Segmental and somatic dysfunction of thoracic region: Secondary | ICD-10-CM

## 2023-01-28 DIAGNOSIS — M9904 Segmental and somatic dysfunction of sacral region: Secondary | ICD-10-CM

## 2023-01-28 DIAGNOSIS — M9909 Segmental and somatic dysfunction of abdomen and other regions: Secondary | ICD-10-CM | POA: Diagnosis not present

## 2023-01-28 DIAGNOSIS — M9903 Segmental and somatic dysfunction of lumbar region: Secondary | ICD-10-CM

## 2023-01-28 DIAGNOSIS — M9901 Segmental and somatic dysfunction of cervical region: Secondary | ICD-10-CM

## 2023-01-28 DIAGNOSIS — M9908 Segmental and somatic dysfunction of rib cage: Secondary | ICD-10-CM | POA: Diagnosis not present

## 2023-01-28 DIAGNOSIS — J4520 Mild intermittent asthma, uncomplicated: Secondary | ICD-10-CM

## 2023-01-28 DIAGNOSIS — M9900 Segmental and somatic dysfunction of head region: Secondary | ICD-10-CM

## 2023-01-28 DIAGNOSIS — M9907 Segmental and somatic dysfunction of upper extremity: Secondary | ICD-10-CM

## 2023-01-28 LAB — URINALYSIS, ROUTINE W REFLEX MICROSCOPIC
Bilirubin, UA: NEGATIVE
Glucose, UA: NEGATIVE
Leukocytes,UA: NEGATIVE
Nitrite, UA: NEGATIVE
Protein,UA: NEGATIVE
RBC, UA: NEGATIVE
Specific Gravity, UA: <1.005 — ABNORMAL LOW (ref 1.005–1.030)
Urobilinogen, Ur: 0.2 mg/dL (ref 0.2–1.0)
pH, UA: 5 (ref 5.0–7.5)

## 2023-01-28 MED ORDER — METHOCARBAMOL 500 MG PO TABS: 500.0000 mg | ORAL_TABLET | Freq: Every evening | ORAL | 3 refills | Status: DC | PRN

## 2023-01-28 MED ORDER — AIRSUPRA 90-80 MCG/ACT IN AERO: 2.0000 | INHALATION_SPRAY | RESPIRATORY_TRACT | 11 refills | Status: DC | PRN

## 2023-01-28 MED ORDER — MONTELUKAST SODIUM 10 MG PO TABS: ORAL_TABLET | ORAL | 3 refills | Status: DC

## 2023-01-28 MED ORDER — ALBUTEROL SULFATE HFA 108 (90 BASE) MCG/ACT IN AERS: 2.0000 | INHALATION_SPRAY | Freq: Four times a day (QID) | RESPIRATORY_TRACT | 12 refills | Status: DC | PRN

## 2023-01-28 NOTE — Progress Notes (Unsigned)
BP 107/69   Pulse 85   Temp 98.8 F (37.1 C) (Oral)   Ht 5\' 4"  (1.626 m)   Wt 173 lb 9.6 oz (78.7 kg)   SpO2 98%   BMI 29.80 kg/m    Subjective:    Patient ID: Karla Huber, female    DOB: 1986-08-08, 37 y.o.   MRN: [id number]  HPI: Karla Huber is a 37 y.o. female presenting on 01/28/2023 for comprehensive medical examination. Current medical complaints include:  Woke up the other day with a headache, better today. Neck is OK a little tight. Top of her L hip   ASTHMA Asthma status: {Blank single:19197::"controlled","uncontrolled","better","worse","exacerbated","stable"} Satisfied with current treatment?: {Blank single:19197::"yes","no"} Albuterol/rescue inhaler frequency:  Dyspnea frequency:  Wheezing frequency: Cough frequency:  Nocturnal symptom frequency:  Limitation of activity: {Blank single:19197::"yes","no"} Current upper respiratory symptoms: {Blank single:19197::"yes","no"} Aerochamber/spacer use: {Blank single:19197::"yes","no"} Visits to ER or Urgent Care in past year: {Blank single:19197::"yes","no"} Pneumovax: {Blank single:19197::"Up to Date","Not up to Date","unknown"} Influenza: {Blank single:19197::"Up to Date","Not up to Date","unknown"}  She currently lives with: Menopausal Symptoms: {Blank single:19197::"yes","no"}  Depression Screen done today and results listed below:     01/28/2023    2:15 PM 10/04/2022   10:56 AM 12/28/2021    2:34 PM 12/05/2021   10:04 AM 11/26/2021   10:21 AM  Depression screen PHQ 2/9  Decreased Interest 0 0 0 0 0  Down, Depressed, Hopeless 0 0 0 0 0  PHQ - 2 Score 0 0 0 0 0  Altered sleeping 0 0 0 0 0  Tired, decreased energy 0 2 0 1 0  Change in appetite 0 0 0 0 0  Feeling bad or failure about yourself  0 0 0 0 0  Trouble concentrating 0 0 0 0 0  Moving slowly or fidgety/restless 0 0 0 0 0  Suicidal thoughts 0 0 0 0 0  PHQ-9 Score 0 2 0 1 0  Difficult doing work/chores Not difficult at all Not difficult at all  Not  difficult at all     The patient {has/does not have:19849} a history of falls. I {did/did not:19850} complete a risk assessment for falls. A plan of care for falls {was/was not:19852} documented.   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   Menometrorrhagia 12/08/2017   Postpartum care following cesarean delivery (4/30) 02/25/2015   Postpartum care following repeat cesarean delivery (1/16) 11/13/2016   Preeclampsia    Traumatic injury during pregnancy in third trimester 02/09/2015    Surgical History:  Past Surgical History:  Procedure Laterality Date   ADENOIDECTOMY     CESAREAN SECTION N/A 02/25/2015   Procedure: CESAREAN SECTION;  Surgeon: Azucena Fallen, MD;  Location: [facility];  Service: Obstetrics;  Laterality: N/A;   CESAREAN SECTION N/A 11/12/2016   Procedure: Repeat CESAREAN SECTION;  Surgeon: Brien Few, MD;  Location: [facility];  Service: Obstetrics;  Laterality: N/A;   FOOT SURGERY     KNEE SURGERY     TONSILLECTOMY      Medications:  Current Outpatient Medications on File Prior to Visit  Medication Sig   Albuterol-Budesonide ([place]) 90-80  MCG/ACT AERO Inhale 2 puffs into the lungs every 4 (four) hours as needed.   budesonide-formoterol (SYMBICORT) 160-4.5 MCG/ACT inhaler Take 2 puffs first thing in am and then another 2 puffs about 12 hours later.   Carbinoxamine Maleate 4 MG TABS Take 1 tablet (4 mg total) by mouth in the morning and at bedtime.   doxycycline (VIBRAMYCIN) 50 MG capsule Take 50 mg by mouth daily.   ipratropium (ATROVENT) 0.06 % nasal spray Place 2 sprays into both nostrils 2 (two) times daily.   montelukast (SINGULAIR) 10 MG  tablet TAKE 1 TABLET BY MOUTH EACH NIGHT AT BEDTIME   No current facility-administered medications on file prior to visit.    Allergies:  Allergies  Allergen Reactions   Hydrocodone-Acetaminophen Hives   Oxycodone-Acetaminophen Nausea And Vomiting    Projectile vomiting   Hydrocodone Hives    Social History:  Social History   Socioeconomic History   Marital status: Married    Spouse name: Not on file   Number of children: Not on file   Years of education: Not on file   Highest education level: Not on file  Occupational History   Occupation: umemployed  Tobacco Use   Smoking status: Never   Smokeless tobacco: Never  Vaping Use   Vaping Use: Never used  Substance and Sexual Activity   Alcohol use: No   Drug use: No   Sexual activity: Yes    Birth control/protection: Pill  Other Topics Concern   Not on file  Social History Narrative   Exercises try 3-5 times a week for 30-76min   Social Determinants of Health   Financial Resource Strain: Not on file  Food Insecurity: Not on file  Transportation Needs: Not on file  Physical Activity: Not on file  Stress: Not on file  Social Connections: Not on file  Intimate Partner Violence: Not on file   Social History   Tobacco Use  Smoking Status Never  Smokeless Tobacco Never   Social History   Substance and Sexual Activity  Alcohol Use No    Family History:  Family History  Problem Relation Age of Onset   Heart disease Mother    Mitral valve prolapse Father    Gout Father    Asthma Sister    Hypertension Maternal Grandmother    Cancer Cousin        AML    Past medical history, surgical history, medications, allergies, family history and social history reviewed with patient today and changes made to appropriate areas of the chart.   Review of Systems  Constitutional: Negative.   HENT: Negative.    Eyes: Negative.   Respiratory: Negative.    Cardiovascular: Negative.   Gastrointestinal:  Positive for  heartburn. Negative for abdominal pain, blood in stool, constipation, diarrhea, melena, nausea and vomiting.  Genitourinary: Negative.   Musculoskeletal: Negative.   Skin: Negative.   Neurological: Negative.   Endo/Heme/Allergies:  Positive for environmental allergies. Negative for polydipsia. Does not bruise/bleed easily.  Psychiatric/Behavioral: Negative.     All other ROS negative except what is listed above and in the HPI.      Objective:    BP 107/69   Pulse 85   Temp 98.8 F (37.1 C) (Oral)   Ht 5\' 4"  (1.626 m)   Wt 173 lb 9.6 oz (78.7 kg)   SpO2 98%   BMI 29.80 kg/m   Wt Readings from Last 3 Encounters:  01/28/23 173 lb 9.6 oz (78.7 kg)  12/19/22 171 lb  3.2 oz (77.7 kg)  11/07/22 175 lb (79.4 kg)    Physical Exam Vitals and nursing note reviewed.  Constitutional:      General: She is not in acute distress.    Appearance: Normal appearance. She is not ill-appearing, toxic-appearing or diaphoretic.  HENT:     Head: Normocephalic and atraumatic.     Right Ear: Tympanic membrane, ear canal and external ear normal. There is no impacted cerumen.     Left Ear: Tympanic membrane, ear canal and external ear normal. There is no impacted cerumen.     Nose: Nose normal. No congestion or rhinorrhea.     Mouth/Throat:     Mouth: Mucous membranes are moist.     Pharynx: Oropharynx is clear. No oropharyngeal exudate or posterior oropharyngeal erythema.  Eyes:     General: No scleral icterus.       Right eye: No discharge.        Left eye: No discharge.     Extraocular Movements: Extraocular movements intact.     Conjunctiva/sclera: Conjunctivae normal.     Pupils: Pupils are equal, round, and reactive to light.  Neck:     Vascular: No carotid bruit.  Cardiovascular:     Rate and Rhythm: Normal rate and regular rhythm.     Pulses: Normal pulses.     Heart sounds: No murmur heard.    No friction rub. No gallop.  Pulmonary:     Effort: Pulmonary effort is normal. No  respiratory distress.     Breath sounds: Normal breath sounds. No stridor. No wheezing, rhonchi or rales.  Chest:     Chest wall: No tenderness.  Abdominal:     General: Abdomen is flat. Bowel sounds are normal. There is no distension.     Palpations: Abdomen is soft. There is no mass.     Tenderness: There is no abdominal tenderness. There is no right CVA tenderness, left CVA tenderness, guarding or rebound.     Hernia: No hernia is present.  Genitourinary:    Comments: Breast and pelvic exams deferred with shared decision making Musculoskeletal:        General: No swelling, tenderness, deformity or signs of injury.     Cervical back: Normal range of motion and neck supple. No rigidity. No muscular tenderness.     Right lower leg: No edema.     Left lower leg: No edema.  Lymphadenopathy:     Cervical: No cervical adenopathy.  Skin:    General: Skin is warm and dry.     Capillary Refill: Capillary refill takes less than 2 seconds.     Coloration: Skin is not jaundiced or pale.     Findings: No bruising, erythema, lesion or rash.  Neurological:     General: No focal deficit present.     Mental Status: She is alert and oriented to person, place, and time. Mental status is at baseline.     Cranial Nerves: No cranial nerve deficit.     Sensory: No sensory deficit.     Motor: No weakness.     Coordination: Coordination normal.     Gait: Gait normal.     Deep Tendon Reflexes: Reflexes normal.  Psychiatric:        Mood and Affect: Mood normal.        Behavior: Behavior normal.        Thought Content: Thought content normal.        Judgment: Judgment normal.    Musculoskeletal:  Exam found {Blank multiple:19196::"Decreased ROM","Tissue texture changes","Tenderness to palpation","Asymmetry"} of patient's  {Blank multiple:19196::"head","neck","thorax","ribs","lumbar","pelvis","sacrum","upper extremity","lower extremity","abdomen"} Osteopathic Structural Exam:   Head: hypertonic  suboccipital muscle, OAESSR, temporal anterior and inferior on the R  Neck: C3ESRR, C4ESRR, hypertonic trap on the R  Thorax: T2-4SRRL, trap hypertonic on the L  Ribs: Ribs 4-7 locked up on the L, Rib 5 locked up on the R  Lumbar: QL hypertonic on the L, L3-5SRRL  Pelvis: Posterior L innominate  Sacrum: L on L torsion  Upper Extremity:fasical strain from L pec into arm and L trap  Abdomen: diaphragm hypertonic on the L  Results for orders placed or performed in visit on 11/07/22  POCT EXHALED NITRIC OXIDE  Result Value Ref Range   FeNO level (ppb) 13       Assessment & Plan:   Problem List Items Addressed This Visit   None    Follow up plan: No follow-ups on file.   LABORATORY TESTING:  - Pap smear: {Blank [id number] done","not applicable","up to date","done elsewhere"}  IMMUNIZATIONS:   - Tdap: Tetanus vaccination status reviewed: {tetanus status:315746}. - Influenza: {Blank single:19197::"Up to date","Administered today","Postponed to flu season","Refused","Given elsewhere"} - Pneumovax: {Blank single:19197::"Up to date","Administered today","Not applicable","Refused","Given elsewhere"} - Prevnar: {Blank single:19197::"Up to date","Administered today","Not applicable","Refused","Given elsewhere"} - COVID: {Blank single:19197::"Up to date","Administered today","Not applicable","Refused","Given elsewhere"} - HPV: {Blank single:19197::"Up to date","Administered today","Not applicable","Refused","Given elsewhere"} - Shingrix vaccine: {Blank single:19197::"Up to date","Administered today","Not applicable","Refused","Given elsewhere"}  PATIENT COUNSELING:   Advised to take 1 mg of folate supplement per day if capable of pregnancy.   Sexuality: Discussed sexually transmitted diseases, partner selection, use of condoms, avoidance of unintended pregnancy  and contraceptive alternatives.   Advised to avoid cigarette smoking.  I discussed with the patient that most people  either abstain from alcohol or drink within safe limits (<=14/week and <=4 drinks/occasion for males, <=7/weeks and <= 3 drinks/occasion for females) and that the risk for alcohol disorders and other health effects rises proportionally with the number of drinks per week and how often a drinker exceeds daily limits.  Discussed cessation/primary prevention of drug use and availability of treatment for abuse.   Diet: Encouraged to adjust caloric intake to maintain  or achieve ideal body weight, to reduce intake of dietary saturated fat and total fat, to limit sodium intake by avoiding high sodium foods and not adding table salt, and to maintain adequate dietary potassium and calcium preferably from fresh fruits, vegetables, and low-fat dairy products.    stressed the importance of regular exercise  Injury prevention: Discussed safety belts, safety helmets, smoke detector, smoking near bedding or upholstery.   Dental health: Discussed importance of regular tooth brushing, flossing, and dental visits.    NEXT PREVENTATIVE PHYSICAL DUE IN 1 YEAR. No follow-ups on file.

## 2023-01-28 NOTE — Progress Notes (Deleted)
   BP 107/69   Pulse 85   Temp 98.8 F (37.1 C) (Oral)   Ht 5\' 4"  (1.626 m)   Wt 173 lb 9.6 oz (78.7 kg)   SpO2 98%   BMI 29.80 kg/m    Subjective:    Patient ID: Karla Huber, female    DOB: 07/04/1986, 37 y.o.   MRN: [id number]  HPI: Karla Huber is a 37 y.o. female  Chief Complaint  Patient presents with   Procedure    OMM    Woke up the other day with a headache, better today. Neck is OK a little tight. Top of her L hip  ASTHMA Asthma status: {Blank single:19197::"controlled","uncontrolled","better","worse","exacerbated","stable"} Satisfied with current treatment?: {Blank single:19197::"yes","no"} Albuterol/rescue inhaler frequency:  Dyspnea frequency:  Wheezing frequency: Cough frequency:  Nocturnal symptom frequency:  Limitation of activity: {Blank single:19197::"yes","no"} Current upper respiratory symptoms: {Blank single:19197::"yes","no"} Aerochamber/spacer use: {Blank single:19197::"yes","no"} Visits to ER or Urgent Care in past year: {Blank single:19197::"yes","no"} Pneumovax: {Blank single:19197::"Up to Date","Not up to Date","unknown"} Influenza: {Blank single:19197::"Up to Date","Not up to Date","unknown"}   Relevant past medical, surgical, family and social history reviewed and updated as indicated. Interim medical history since our last visit reviewed. Allergies and medications reviewed and updated.  Review of Systems  Constitutional: Negative.   HENT: Negative.      Per HPI unless specifically indicated above     Objective:    BP 107/69   Pulse 85   Temp 98.8 F (37.1 C) (Oral)   Ht 5\' 4"  (1.626 m)   Wt 173 lb 9.6 oz (78.7 kg)   SpO2 98%   BMI 29.80 kg/m   Wt Readings from Last 3 Encounters:  01/28/23 173 lb 9.6 oz (78.7 kg)  12/19/22 171 lb 3.2 oz (77.7 kg)  11/07/22 175 lb (79.4 kg)    Physical Exam Musculoskeletal:  Exam found {Blank multiple:19196::"Decreased ROM","Tissue texture changes","Tenderness to  palpation","Asymmetry"} of patient's  {Blank multiple:19196::"head","neck","thorax","ribs","lumbar","pelvis","sacrum","upper extremity","lower extremity","abdomen"} Osteopathic Structural Exam:   Head: hypertonic suboccipital muscle, OAESSR, temporal anterior and inferior on the R  Neck: C3ESRR, C4ESRR, hypertonic trap on the R  Thorax: T2-4SRRL, trap hypertonic on the L  Ribs: Ribs 4-7 locked up on the L, Rib 5 locked up on the R  Lumbar: QL hypertonic on the L, L3-5SRRL  Pelvis: Posterior L innominate  Sacrum: L on L torsion  Upper Extremity:fasical strain from L pec into arm and L trap  Abdomen: diaphragm hypertonic on the L  Results for orders placed or performed in visit on 11/07/22  POCT EXHALED NITRIC OXIDE  Result Value Ref Range   FeNO level (ppb) 13       Assessment & Plan:   Problem List Items Addressed This Visit   None    Follow up plan: No follow-ups on file.

## 2023-01-29 ENCOUNTER — Encounter: Payer: BC Managed Care – PPO | Admitting: Family Medicine

## 2023-01-29 LAB — CBC WITH DIFFERENTIAL/PLATELET
Basophils Absolute: 0.1 10*3/uL (ref 0.0–0.2)
Basos: 0 %
EOS (ABSOLUTE): 0.2 10*3/uL (ref 0.0–0.4)
Eos: 1 %
Hematocrit: 44 % (ref 34.0–46.6)
Hemoglobin: 14.8 g/dL (ref 11.1–15.9)
Immature Grans (Abs): 0 10*3/uL (ref 0.0–0.1)
Immature Granulocytes: 0 %
Lymphocytes Absolute: 2.3 10*3/uL (ref 0.7–3.1)
Lymphs: 19 %
MCH: 33.2 pg — ABNORMAL HIGH (ref 26.6–33.0)
MCHC: 33.6 g/dL (ref 31.5–35.7)
MCV: 99 fL — ABNORMAL HIGH (ref 79–97)
Monocytes Absolute: 0.7 10*3/uL (ref 0.1–0.9)
Monocytes: 6 %
Neutrophils Absolute: 8.7 10*3/uL — ABNORMAL HIGH (ref 1.4–7.0)
Neutrophils: 74 %
Platelets: 385 10*3/uL (ref 150–450)
RBC: 4.46 x10E6/uL (ref 3.77–5.28)
RDW: 11.9 % (ref 11.7–15.4)
WBC: 11.8 10*3/uL — ABNORMAL HIGH (ref 3.4–10.8)

## 2023-01-29 LAB — COMPREHENSIVE METABOLIC PANEL
ALT: 25 IU/L (ref 0–32)
AST: 20 IU/L (ref 0–40)
Albumin/Globulin Ratio: 1.9 (ref 1.2–2.2)
Albumin: 4.9 g/dL (ref 3.9–4.9)
Alkaline Phosphatase: 74 IU/L (ref 44–121)
BUN/Creatinine Ratio: 15 (ref 9–23)
BUN: 11 mg/dL (ref 6–20)
Bilirubin Total: 0.3 mg/dL (ref 0.0–1.2)
CO2: 21 mmol/L (ref 20–29)
Calcium: 10.5 mg/dL — ABNORMAL HIGH (ref 8.7–10.2)
Chloride: 99 mmol/L (ref 96–106)
Creatinine, Ser: 0.71 mg/dL (ref 0.57–1.00)
Globulin, Total: 2.6 g/dL (ref 1.5–4.5)
Glucose: 85 mg/dL (ref 70–99)
Potassium: 4.2 mmol/L (ref 3.5–5.2)
Sodium: 138 mmol/L (ref 134–144)
Total Protein: 7.5 g/dL (ref 6.0–8.5)
eGFR: 112 mL/min/{1.73_m2} (ref >59)

## 2023-01-29 LAB — TSH: TSH: 1.47 u[IU]/mL (ref 0.450–4.500)

## 2023-01-29 LAB — LIPID PANEL W/O CHOL/HDL RATIO
Cholesterol, Total: 188 mg/dL (ref 100–199)
HDL: 79 mg/dL (ref >39)
LDL Chol Calc (NIH): 85 mg/dL (ref 0–99)
Triglycerides: 141 mg/dL (ref 0–149)
VLDL Cholesterol Cal: 24 mg/dL (ref 5–40)

## 2023-01-30 ENCOUNTER — Encounter: Payer: Self-pay | Admitting: Family Medicine

## 2023-01-30 NOTE — Assessment & Plan Note (Signed)
Has been doing well with her symbicort. Continue with Colin Ina when she's sick only. Call with any concerns.

## 2023-03-17 DIAGNOSIS — L209 Atopic dermatitis, unspecified: Secondary | ICD-10-CM | POA: Diagnosis not present

## 2023-03-17 DIAGNOSIS — L719 Rosacea, unspecified: Secondary | ICD-10-CM | POA: Diagnosis not present

## 2023-03-27 ENCOUNTER — Ambulatory Visit: Payer: BC Managed Care – PPO | Admitting: Internal Medicine

## 2023-06-19 DIAGNOSIS — J45901 Unspecified asthma with (acute) exacerbation: Secondary | ICD-10-CM | POA: Diagnosis not present

## 2023-06-19 DIAGNOSIS — J018 Other acute sinusitis: Secondary | ICD-10-CM | POA: Diagnosis not present

## 2023-06-19 DIAGNOSIS — R051 Acute cough: Secondary | ICD-10-CM | POA: Diagnosis not present

## 2023-06-19 DIAGNOSIS — U071 COVID-19: Secondary | ICD-10-CM | POA: Diagnosis not present

## 2023-06-20 ENCOUNTER — Ambulatory Visit (INDEPENDENT_AMBULATORY_CARE_PROVIDER_SITE_OTHER): Payer: BC Managed Care – PPO | Admitting: Family Medicine

## 2023-06-20 VITALS — BP 132/81 | HR 113 | Temp 98.4°F | Ht 64.0 in | Wt 163.0 lb

## 2023-06-20 DIAGNOSIS — N939 Abnormal uterine and vaginal bleeding, unspecified: Secondary | ICD-10-CM | POA: Insufficient documentation

## 2023-06-20 DIAGNOSIS — M9901 Segmental and somatic dysfunction of cervical region: Secondary | ICD-10-CM | POA: Diagnosis not present

## 2023-06-20 DIAGNOSIS — M9908 Segmental and somatic dysfunction of rib cage: Secondary | ICD-10-CM

## 2023-06-20 DIAGNOSIS — M545 Low back pain, unspecified: Secondary | ICD-10-CM

## 2023-06-20 DIAGNOSIS — M9902 Segmental and somatic dysfunction of thoracic region: Secondary | ICD-10-CM

## 2023-06-20 DIAGNOSIS — M9900 Segmental and somatic dysfunction of head region: Secondary | ICD-10-CM

## 2023-06-20 DIAGNOSIS — J4520 Mild intermittent asthma, uncomplicated: Secondary | ICD-10-CM | POA: Diagnosis not present

## 2023-06-20 DIAGNOSIS — M9904 Segmental and somatic dysfunction of sacral region: Secondary | ICD-10-CM

## 2023-06-20 DIAGNOSIS — M9905 Segmental and somatic dysfunction of pelvic region: Secondary | ICD-10-CM | POA: Diagnosis not present

## 2023-06-20 DIAGNOSIS — M9903 Segmental and somatic dysfunction of lumbar region: Secondary | ICD-10-CM | POA: Diagnosis not present

## 2023-06-20 DIAGNOSIS — M9909 Segmental and somatic dysfunction of abdomen and other regions: Secondary | ICD-10-CM

## 2023-06-20 DIAGNOSIS — Z8632 Personal history of gestational diabetes: Secondary | ICD-10-CM | POA: Insufficient documentation

## 2023-06-20 DIAGNOSIS — D649 Anemia, unspecified: Secondary | ICD-10-CM | POA: Insufficient documentation

## 2023-06-20 NOTE — Progress Notes (Unsigned)
BP 132/81   Pulse (!) 113   Temp 98.4 F (36.9 C) (Oral)   Ht 5\' 4"  (1.626 m)   Wt 163 lb (73.9 kg)   SpO2 98%   BMI 27.98 kg/m    Subjective:    Patient ID: Karla Huber, female    DOB: [birth year], 37 y.o.   MRN: [id number]  HPI: Karla Huber is a 37 y.o. female  Chief Complaint  Patient presents with   OMM   ASTHMA Asthma status: exacerbated Satisfied with current treatment?: no Albuterol/rescue inhaler frequency: every 4 hours Dyspnea frequency: daily Wheezing frequency: daily Cough frequency: often Nocturnal symptom frequency: in the past couple of days  Limitation of activity: yes Current upper respiratory symptoms: yes Aerochamber/spacer use: yes Visits to ER or Urgent Care in past year: yes Pneumovax: Up to Date Influenza: Not up to Date  Debrina notes that her low back has been hurting since she's been on the prednisone. She has been having pain mainly in her SI joints. It's aching and sore and tender. Worse with the prednisone and certain movements and better with rest. She notes that she is also having pain in the back of her head. She notes that her neck and head are feeling tight. No radiation. She is otherwise feeling well with no other concerns or complaints at this time.   Relevant past medical, surgical, family and social history reviewed and updated as indicated. Interim medical history since our last visit reviewed. Allergies and medications reviewed and updated.  Review of Systems  Constitutional: Negative.   Respiratory:  Positive for cough, chest tightness, shortness of breath and wheezing. Negative for apnea, choking and stridor.   Cardiovascular: Negative.   Gastrointestinal: Negative.   Musculoskeletal: Negative.   Neurological: Negative.   Psychiatric/Behavioral: Negative.      Per HPI unless specifically indicated above     Objective:    BP 132/81   Pulse (!) 113   Temp 98.4 F (36.9 C) (Oral)   Ht 5\' 4"  (1.626 m)   Wt 163  lb (73.9 kg)   SpO2 98%   BMI 27.98 kg/m   Wt Readings from Last 3 Encounters:  06/20/23 163 lb (73.9 kg)  01/28/23 173 lb 9.6 oz (78.7 kg)  12/19/22 171 lb 3.2 oz (77.7 kg)    Physical Exam Vitals and nursing note reviewed.  Constitutional:      General: She is not in acute distress.    Appearance: Normal appearance. She is not ill-appearing.  HENT:     Head: Normocephalic and atraumatic.     Right Ear: External ear normal.     Left Ear: External ear normal.     Nose: Nose normal.     Mouth/Throat:     Mouth: Mucous membranes are moist.     Pharynx: Oropharynx is clear.  Eyes:     Extraocular Movements: Extraocular movements intact.     Conjunctiva/sclera: Conjunctivae normal.     Pupils: Pupils are equal, round, and reactive to light.  Neck:     Vascular: No carotid bruit.  Cardiovascular:     Rate and Rhythm: Normal rate.     Pulses: Normal pulses.  Pulmonary:     Effort: Pulmonary effort is normal. No respiratory distress.  Abdominal:     General: Abdomen is flat. There is no distension.     Palpations: Abdomen is soft. There is no mass.     Tenderness: There is no abdominal tenderness. There is no right CVA  tenderness, left CVA tenderness, guarding or rebound.     Hernia: No hernia is present.  Musculoskeletal:     Cervical back: No muscular tenderness.  Lymphadenopathy:     Cervical: No cervical adenopathy.  Skin:    General: Skin is warm and dry.     Capillary Refill: Capillary refill takes less than 2 seconds.     Coloration: Skin is not jaundiced or pale.     Findings: No bruising, erythema, lesion or rash.  Neurological:     General: No focal deficit present.     Mental Status: She is alert. Mental status is at baseline.  Psychiatric:        Mood and Affect: Mood normal.        Behavior: Behavior normal.        Thought Content: Thought content normal.        Judgment: Judgment normal.    Musculoskeletal:  Exam found Decreased ROM, Tissue texture  changes, Tenderness to palpation, and Asymmetry of patient's  head, neck, thorax, ribs, lumbar, pelvis, sacrum, and abdomen Osteopathic Structural Exam:   Head: OAESSR, hypertonic suboccipital muscles, temporal anterior and inferior on R, SBS SBRR  Neck: C4ESRR, paraspinals hypertonic on the R, C6ESRL  Thorax: T3-5SLRR  Ribs: Ribs 5-7 locked up on the L, Rib 4-6 locked up on the R  Lumbar: QL hypertonic on the R, L1-2SRRL  Pelvis: Anterior R innominate  Sacrum: R on R torsion, SI joint restricted on the R  Abdomen: diaphragm hypertonic bilaterally R>L  Results for orders placed or performed in visit on 01/28/23  CBC with Differential/Platelet  Result Value Ref Range   WBC 11.8 (H) 3.4 - 10.8 x10E3/uL   RBC 4.46 3.77 - 5.28 x10E6/uL   Hemoglobin 14.8 11.1 - 15.9 g/dL   Hematocrit 16.1 09.6 - 46.6 %   MCV 99 (H) 79 - 97 fL   MCH 33.2 (H) 26.6 - 33.0 pg   MCHC 33.6 31.5 - 35.7 g/dL   RDW 04.5 40.9 - 81.1 %   Platelets 385 150 - 450 x10E3/uL   Neutrophils 74 Not Estab. %   Lymphs 19 Not Estab. %   Monocytes 6 Not Estab. %   Eos 1 Not Estab. %   Basos 0 Not Estab. %   Neutrophils Absolute 8.7 (H) 1.4 - 7.0 x10E3/uL   Lymphocytes Absolute 2.3 0.7 - 3.1 x10E3/uL   Monocytes Absolute 0.7 0.1 - 0.9 x10E3/uL   EOS (ABSOLUTE) 0.2 0.0 - 0.4 x10E3/uL   Basophils Absolute 0.1 0.0 - 0.2 x10E3/uL   Immature Granulocytes 0 Not Estab. %   Immature Grans (Abs) 0.0 0.0 - 0.1 x10E3/uL  Comprehensive metabolic panel  Result Value Ref Range   Glucose 85 70 - 99 mg/dL   BUN 11 6 - 20 mg/dL   Creatinine, Ser 9.14 0.57 - 1.00 mg/dL   eGFR 782 >95 AO/ZHY/8.65   BUN/Creatinine Ratio 15 9 - 23   Sodium 138 134 - 144 mmol/L   Potassium 4.2 3.5 - 5.2 mmol/L   Chloride 99 96 - 106 mmol/L   CO2 21 20 - 29 mmol/L   Calcium 10.5 (H) 8.7 - 10.2 mg/dL   Total Protein 7.5 6.0 - 8.5 g/dL   Albumin 4.9 3.9 - 4.9 g/dL   Globulin, Total 2.6 1.5 - 4.5 g/dL   Albumin/Globulin Ratio 1.9 1.2 - 2.2   Bilirubin  Total 0.3 0.0 - 1.2 mg/dL   Alkaline Phosphatase 74 44 - 121 IU/L   AST  20 0 - 40 IU/L   ALT 25 0 - 32 IU/L  Lipid Panel w/o Chol/HDL Ratio  Result Value Ref Range   Cholesterol, Total 188 100 - 199 mg/dL   Triglycerides 409 0 - 149 mg/dL   HDL 79 >81 mg/dL   VLDL Cholesterol Cal 24 5 - 40 mg/dL   LDL Chol Calc (NIH) 85 0 - 99 mg/dL  Urinalysis, Routine w reflex microscopic  Result Value Ref Range   Specific Gravity, UA <1.005 (L) 1.005 - 1.030   pH, UA 5.0 5.0 - 7.5   Color, UA Yellow Yellow   Appearance Ur Clear Clear   Leukocytes,UA Negative Negative   Protein,UA Negative Negative/Trace   Glucose, UA Negative Negative   Ketones, UA 1+ (A) Negative   RBC, UA Negative Negative   Bilirubin, UA Negative Negative   Urobilinogen, Ur 0.2 0.2 - 1.0 mg/dL   Nitrite, UA Negative Negative   Microscopic Examination Comment   TSH  Result Value Ref Range   TSH 1.470 0.450 - 4.500 uIU/mL      Assessment & Plan:   Problem List Items Addressed This Visit       Respiratory   Asthma - Primary    Tight sounding but no rhonchi today. Continue steroid taper. Agree with immunology appt on Monday. Call with any concerns or if not getting better.       Relevant Medications   ipratropium-albuterol (DUONEB) 0.5-2.5 (3) MG/3ML SOLN   predniSONE (DELTASONE) 10 MG tablet   Other Visit Diagnoses     Acute bilateral low back pain without sciatica       In acute exacerbation. She does have somatic dysfunction that is contributring to her symptoms. Treated today with good results as below. Call with any concerns   Relevant Medications   predniSONE (DELTASONE) 10 MG tablet   Head region somatic dysfunction       Cervical segment dysfunction       Thoracic segment dysfunction       Somatic dysfunction of lumbar region       Somatic dysfunction of sacral region       Somatic dysfunction of pelvis region       Rib cage region somatic dysfunction       Segmental dysfunction of abdomen             Follow up plan: Return if symptoms worsen or fail to improve.

## 2023-06-20 NOTE — Assessment & Plan Note (Signed)
Tight sounding but no rhonchi today. Continue steroid taper. Agree with immunology appt on Monday. Call with any concerns or if not getting better.

## 2023-06-22 ENCOUNTER — Encounter: Payer: Self-pay | Admitting: Family Medicine

## 2023-06-23 ENCOUNTER — Encounter: Payer: Self-pay | Admitting: Internal Medicine

## 2023-06-23 ENCOUNTER — Ambulatory Visit (INDEPENDENT_AMBULATORY_CARE_PROVIDER_SITE_OTHER): Payer: BC Managed Care – PPO | Admitting: Internal Medicine

## 2023-06-23 VITALS — BP 138/70 | HR 112 | Temp 99.0°F | Resp 18 | Ht 62.95 in | Wt 164.3 lb

## 2023-06-23 DIAGNOSIS — J45998 Other asthma: Secondary | ICD-10-CM

## 2023-06-23 DIAGNOSIS — J4550 Severe persistent asthma, uncomplicated: Secondary | ICD-10-CM

## 2023-06-23 DIAGNOSIS — J3089 Other allergic rhinitis: Secondary | ICD-10-CM | POA: Diagnosis not present

## 2023-06-23 DIAGNOSIS — J339 Nasal polyp, unspecified: Secondary | ICD-10-CM | POA: Diagnosis not present

## 2023-06-23 DIAGNOSIS — Z886 Allergy status to analgesic agent status: Secondary | ICD-10-CM

## 2023-06-23 DIAGNOSIS — J45909 Unspecified asthma, uncomplicated: Secondary | ICD-10-CM

## 2023-06-23 MED ORDER — FLUTICASONE PROPIONATE 50 MCG/ACT NA SUSP
2.0000 | Freq: Every day | NASAL | 2 refills | Status: DC
Start: 2023-06-23 — End: 2024-02-16

## 2023-06-23 MED ORDER — CARBINOXAMINE MALEATE 4 MG PO TABS: 4.0000 mg | ORAL_TABLET | Freq: Three times a day (TID) | ORAL | 5 refills | Status: DC

## 2023-06-23 MED ORDER — DUPILUMAB 300 MG/2ML ~~LOC~~ SOSY
600.0000 mg | PREFILLED_SYRINGE | Freq: Once | SUBCUTANEOUS | Status: AC
Start: 2023-06-23 — End: 2023-06-23
  Administered 2023-06-23: 600 mg via SUBCUTANEOUS

## 2023-06-23 NOTE — Progress Notes (Unsigned)
New Patient Note  RE: Karla Huber MRN: [id number] DOB: 1986/10/13 Date of Office Visit: 06/23/2023  Consult requested by: Dorcas Carrow, DO Primary care provider: Dorcas Carrow, DO  Chief Complaint: Asthma  History of Present Illness: I had the pleasure of seeing Karla Huber for initial evaluation at the [facility] on 06/23/2023. She is a 37 y.o. female, who is referred here by Dorcas Carrow, DO for the evaluation of asthma, allergic rhinitis .  History obtained from patient, chart review.  She tested positive for Covid 2 weeks ago and since then has had worsening symptoms.    Asthma History:  -Diagnosed at age age 35 years .  -Current symptoms include chest tightness, cough, shortness of breath, and wheezing *** daytime symptoms in past month, *** nighttime awakenings in past month Using rescue inhaler *** -Limitations to daily activity: some - 2 ED visits, 0 UC visits and 2 oral steroids in the past year - 0 number of lifetime hospitalizations, 0 number of lifetime intubations.  - Identified Triggers:  peanut butter  and respiratory illness, seasonal changes, ibuprofen  - Up-to-date with pneumonia,, Covid-19,, and Flu, vaccines. - History of prior pneumonias: denies  - History of prior COVID-19/ FLU/ RSV infection: 12/22 - Smoking exposure: *** Previous Diagnostics:  - Prior PFTs or spirometry: *** - Most Recent AEC (01/28/23): 200 -Most Recent Chest Imaging: CXR  on (01/19/21): FINDINGS: The cardiomediastinal contours are normal. The lungs are clear. Pulmonary vasculature is normal. No consolidation, pleural effusion, or pneumothorax. No visualized left rib fracture. No acute osseous abnormalities are seen. - Today's Asthma Control Test:  .   Management:  - Previously used therapies: symbicort ,  Breztri, singulair  - Current regimen:  - Maintenance: symbicort 160cmg 2 puffs daily  - Rescue: Air supra, albuterol 2 puffs q4-6 hrs  PRN, using  prior to exercise  Chronic rhinitis: started around age 61  Symptoms include:  hyposmia , nasal congestion, rhinorrhea, post nasal drainage, and sneezing  Occurs year-round worsen with weather changes  Potential triggers: weather changes, cats,  Treatments tried:  atrovent, flonase,  Previous allergy testing: no History of reflux/heartburn: yes; prilosec (rare breakthrough symptoms)  History of chronic sinusitis or sinus surgery: no Nonallergic triggers:  denies       Assessment and Plan: Karla Huber is a 37 y.o. female with: Not well controlled severe persistent asthma  Aspirin-exacerbated respiratory disease (AERD)  *** Plan: Patient Instructions  Severe Persistent  Asthma: Not Well  Controlled  - your lung testing today looked okay, but based on exacerbation rate and symptoms  you are not well controlled  Given cobomid AERD will start dupixent  600mg  loading dose given to day in clinic, consent obtained today   - Our biologic coordinator will reach out to you in the approval process   - Maintenance dosing will be 300mg  every 2 weeks  - Continue antibiotics and steroids prescribed already   PLAN:  - Spacer use reviewed. - Controller Inhaler: Start breztri   2 puffs twice a day; This Should Be Used Everyday - sample given today, use until complete then step down to      Symbicort 2 puffs twice daily  - Rinse mouth out after use - Rescue Inhaler: Albuterol (Proair/Ventolin) 2 puffs . Use  every 4-6 hours as needed for chest tightness, wheezing, or coughing.  Can also use 15 minutes prior to exercise if you have symptoms with activity. - Asthma is  not controlled if:  - Symptoms are occurring >2 times a week OR  - >2 times a month nighttime awakenings  - You are requiring systemic steroids (prednisone/steroid injections) more than once per year  - Your require hospitalization for your asthma.  - Please call the clinic to schedule a follow up if these symptoms  arise   Chronic Rhinitis  with history of nasal polyp and Ibuprofen allergy (AERD)  : - allergy testing today: deferred due to poorly controlled asthma  - Will get previous testing results from [facility]   - Prevention:  - allergen avoidance when possible - consider allergy shots as long term control of your symptoms by teaching your immune system to be more tolerant of your allergy triggers  - Symptom control: - Start Nasal Steroid Spray: Best results if used daily. - Options include Flonase (fluticasone), Nasocort (triamcinolone), Nasonex (mometasome) 1- 2 sprays in each nostril daily.  - All can be purchased over-the-counter if not covered by insurance. - Continue Astelin (azelastine) 1-2 sprays in each nostril twice a day as needed for nasal congestion/itchy nose - Consider Atrovent (Ipratropium Bromide) 1-2 sprays in each nostril up to 3 times a day as needed for runny nose/post nasal drip/drainage.   - Use less frequently if airway gets too dry. - Continue Singulair (Montelukast) 10mg  nightly.   - Discontinue if nightmares of behavior changes. - Start Carbinoxamine 4mg  2-3 times a day   Follow up: in 3 months  Let us know if you have issues getting dupixent approved   Thank you so much for letting me partake in your care today.  Don't hesitate to reach out if you have any additional concerns!  Karla Luz, MD  [facility] [place]    No orders of the defined types were placed in this encounter.  Lab Orders  No laboratory test(s) ordered today    Other allergy screening: Asthma: yes Rhino conjunctivitis: yes Food allergy:  as above Medication allergy: yes Hymenoptera allergy: no Urticaria: no Eczema:no History of recurrent infections suggestive of immunodeficency: no  Diagnostics: Spirometry:  Tracings reviewed. Her effort: Good reproducible efforts. FVC: 3.31L FEV1: 2.91L, 95% predicted FEV1/FVC ratio: 88% Interpretation:  Spirometry consistent with normal pattern.  Please see scanned spirometry results for details.  Skin Testing:  deferred due to asthma  .    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   Menometrorrhagia 12/08/2017   Postpartum care following cesarean delivery (4/30) 02/25/2015   Postpartum care following repeat cesarean delivery (1/16) 11/13/2016   Preeclampsia    Traumatic  injury during pregnancy in third trimester 02/09/2015   Past Surgical History: Past Surgical History:  Procedure Laterality Date   ADENOIDECTOMY     CESAREAN SECTION N/A 02/25/2015   Procedure: CESAREAN SECTION;  Surgeon: Shea Evans, MD;  Location: [facility];  Service: Obstetrics;  Laterality: N/A;   CESAREAN SECTION N/A 11/12/2016   Procedure: Repeat CESAREAN SECTION;  Surgeon: Olivia Mackie, MD;  Location: [facility] SU[facility];  Service: Obstetrics;  Laterality: N/A;   FOOT SURGERY     KNEE SURGERY     TONSILLECTOMY     Medication List:  Current Outpatient Medications  Medication Sig Dispense Refill   Albuterol-Budesonide (AIRSUPRA) 90-80 MCG/ACT AERO  Inhale 2 puffs into the lungs every 4 (four) hours as needed (To be taken with illness or asthma flare). 10.7 g 11   amoxicillin-clavulanate (AUGMENTIN) 875-125 MG tablet Take 1 tablet by mouth 2 (two) times daily.     azelastine (ASTELIN) 0.1 % nasal spray Place 2 sprays into both nostrils 2 (two) times daily.     budesonide-formoterol (SYMBICORT) 160-4.5 MCG/ACT inhaler Take 2 puffs first thing in am and then another 2 puffs about 12 hours later. 1 each 12   ipratropium (ATROVENT) 0.06 % nasal spray Place 2 sprays into both nostrils 2 (two) times daily. 15 mL 11   ipratropium-albuterol (DUONEB) 0.5-2.5 (3) MG/3ML SOLN Take 3 mLs by nebulization every 6 (six) hours as needed.     methocarbamol (ROBAXIN) 500 MG tablet Take 1 tablet (500 mg total) by mouth at bedtime as needed for muscle spasms. 30 tablet 3   montelukast (SINGULAIR) 10 MG tablet TAKE 1 TABLET BY MOUTH EACH NIGHT AT BEDTIME 90 tablet 3   predniSONE (DELTASONE) 10 MG tablet Take 40 mg by mouth daily.     promethazine-dextromethorphan (PROMETHAZINE-DM) 6.25-15 MG/5ML syrup Take 5 mLs by mouth 4 (four) times daily as needed.     triamcinolone ointment (KENALOG) 0.1 % Apply 1 Application topically 2 (two) times daily.     No current facility-administered medications for this visit.   Allergies: Allergies  Allergen Reactions   Hydrocodone-Acetaminophen Hives   Oxycodone-Acetaminophen Nausea And Vomiting    Projectile vomiting   Hydrocodone Hives   Social History: Social History   Socioeconomic History   Marital status: Married    Spouse name: Not on file   Number of children: Not on file   Years of education: Not on file   Highest education level: Not on file  Occupational History   Occupation: umemployed  Tobacco Use   Smoking status: Never   Smokeless tobacco: Never  Vaping Use   Vaping status: Never Used  Substance and Sexual Activity   Alcohol use: No   Drug use: No   Sexual activity: Yes    Birth  control/protection: Pill  Other Topics Concern   Not on file  Social History Narrative   Exercises try 3-5 times a week for 30-32min   Social Determinants of Health   Financial Resource Strain: Not on file  Food Insecurity: Not on file  Transportation Needs: Not on file  Physical Activity: Not on file  Stress: Not on file  Social Connections: Unknown (02/28/2022)   [facility]   Social Network    Social Network: Not on file   Lives in a ***. Smoking: *** Occupation: ***  Environmental History: Water Damage/mildew in the house: Copywriter, advertising in the family room: {Blank single:19197::"yes","no"} Carpet in the bedroom: {Blank single:19197::"yes","no"} Heating: {Blank single:19197::"electric","gas","heat  pump"} Cooling: {Blank single:19197::"central","window","heat pump"} Pet: {Blank single:19197::"yes ***","no"}  Family History: Family History  Problem Relation Age of Onset   Heart disease Mother    Mitral valve prolapse Father    Gout Father    Asthma Sister    Hypertension Maternal Grandmother    Cancer Cousin        AML     ROS: All others negative except as noted per HPI.   Objective: There were no vitals taken for this visit. There is no height or weight on file to calculate BMI.  General Appearance:  Alert, cooperative, no distress, appears stated age  Head:  Normocephalic, without obvious abnormality, atraumatic  Eyes:  Conjunctiva clear, EOM's intact  Nose: Nares normal, {Blank multiple:19196:a:"***","hypertrophic turbinates","normal mucosa","no visible anterior polyps","septum midline"}  Throat: Lips, tongue normal; teeth and gums normal, {Blank multiple:19196:a:"***","normal posterior oropharynx","tonsils 2+","tonsils 3+","no tonsillar exudate","+ cobblestoning"}  Neck: Supple, symmetrical  Lungs:   {Blank multiple:19196:a:"***","clear to auscultation bilaterally","end-expiratory wheezing","wheezing  throughout"}, Respirations unlabored, {Blank multiple:19196:a:"***","no coughing","intermittent dry coughing"}  Heart:  {Blank multiple:19196:a:"***","regular rate and rhythm","no murmur"}, Appears well perfused  Extremities: No edema  Skin: Skin color, texture, turgor normal, {Blank multiple:19196:a:"***","no rashes or lesions on visualized portions of skin"}  Neurologic: No gross deficits   The plan was reviewed with the patient/family, and all questions/concerned were addressed.  It was my pleasure to see Necole today and participate in her care. Please feel free to contact me with any questions or concerns.  Sincerely,  Karla Luz, MD Allergy & Immunology  [facility] office: [phone] [facility] office: [phone]

## 2023-06-23 NOTE — Patient Instructions (Signed)
Severe Persistent  Asthma: Not Well  Controlled  - your lung testing today looked okay, but based on exacerbation rate and symptoms  you are not well controlled  Given cobomid AERD will start dupixent  600mg  loading dose given to day in clinic, consent obtained today   - Our biologic coordinator will reach out to you in the approval process   - Maintenance dosing will be 300mg  every 2 weeks  - Continue antibiotics and steroids prescribed already   PLAN:  - Spacer use reviewed. - Controller Inhaler: Start breztri   2 puffs twice a day; This Should Be Used Everyday - sample given today, use until complete then step down to      Symbicort 2 puffs twice daily  - Rinse mouth out after use - Rescue Inhaler: Albuterol (Proair/Ventolin) 2 puffs . Use  every 4-6 hours as needed for chest tightness, wheezing, or coughing.  Can also use 15 minutes prior to exercise if you have symptoms with activity. - Asthma is not controlled if:  - Symptoms are occurring >2 times a week OR  - >2 times a month nighttime awakenings  - You are requiring systemic steroids (prednisone/steroid injections) more than once per year  - Your require hospitalization for your asthma.  - Please call the clinic to schedule a follow up if these symptoms arise   Chronic Rhinitis  with history of nasal polyp and Ibuprofen allergy (AERD)  : - allergy testing today: deferred due to poorly controlled asthma  - Will get previous testing results from [facility]   - Prevention:  - allergen avoidance when possible - consider allergy shots as long term control of your symptoms by teaching your immune system to be more tolerant of your allergy triggers  - Symptom control: - Start Nasal Steroid Spray: Best results if used daily. - Options include Flonase (fluticasone), Nasocort (triamcinolone), Nasonex (mometasome) 1- 2 sprays in each nostril daily.  - All can be purchased over-the-counter if not covered by insurance. - Continue  Astelin (azelastine) 1-2 sprays in each nostril twice a day as needed for nasal congestion/itchy nose - Consider Atrovent (Ipratropium Bromide) 1-2 sprays in each nostril up to 3 times a day as needed for runny nose/post nasal drip/drainage.   - Use less frequently if airway gets too dry. - Continue Singulair (Montelukast) 10mg  nightly.   - Discontinue if nightmares of behavior changes. - Start Carbinoxamine 4mg  2-3 times a day   Follow up: in 3 months  Let us know if you have issues getting dupixent approved   Thank you so much for letting me partake in your care today.  Don't hesitate to reach out if you have any additional concerns!  Ferol Luz, MD  [facility]- [place], [place]

## 2023-06-25 ENCOUNTER — Encounter: Payer: Self-pay | Admitting: Family

## 2023-06-30 ENCOUNTER — Telehealth: Payer: BC Managed Care – PPO | Admitting: Nurse Practitioner

## 2023-06-30 DIAGNOSIS — B370 Candidal stomatitis: Secondary | ICD-10-CM

## 2023-06-30 MED ORDER — NYSTATIN 100000 UNIT/ML MT SUSP
5.0000 mL | Freq: Four times a day (QID) | OROMUCOSAL | 0 refills | Status: AC | PRN
Start: 2023-06-30 — End: ?

## 2023-06-30 NOTE — Progress Notes (Signed)
E-Visit for Mouth Ulcers  We are sorry that you are not feeling well.  Here is how we plan to help!  Based on what you have shared with me, it appears that you do have oral thrush This is likely from the recent steroid and antibiotic use       The following medications should decrease the discomfort and help with healing. Meds ordered this encounter  Medications   nystatin (MYCOSTATIN) 100000 UNIT/ML suspension    Sig: Use as directed 5 mLs (500,000 Units total) in the mouth or throat 4 (four) times daily as needed (thrush h). Swish gargle and spit    Dispense:  120 mL    Refill:  0     Mouth ulcers are painful areas in the mouth and gums. These are also known as "canker sores".  They can occur anywhere inside the mouth. While mostly harmless, mouth ulcers can be extremely uncomfortable and may make it difficult to eat, drink, and brush your teeth.  You may have more than 1 ulcer and they can vary and change in size. Mouth ulcers are not contagious and should not be confused with cold sores.  Cold sores appear on the lip or around the outside of the mouth and often begin with a tingling, burning or itching sensation.   While the exact causes are unknown, some common causes and factors that may aggravate mouth ulcers include: Genetics - Sometimes mouth ulcers run in families High alcohol intake Acidic foods such as citrus fruits like pineapple, grapefruit, orange fruits/juices, may aggravate mouth ulcers Other foods high in acidity or spice such as coffee, chocolate, chips, pretzels, eggs, nuts, cheese Quitting smoking Injury caused by biting the tongue or inside of the cheek Diet lacking in B-12, zinc, folic acid or iron Female hormone shifts with menstruation Excessive fatigue, emotional stress or anxiety Prevention: Talk to your doctor if you are taking meds that are known to cause mouth ulcers such as:   Anti-inflammatory drugs (for example Ibuprofen, Naproxen sodium), pain killers,  Beta blockers, Oral nicotine replacement drugs, Some street drugs (heroin).   Avoid allowing any tablets to dissolve in your mouth that are meant to swallowed whole Avoid foods/drinks that trigger or worsen symptoms Keep your mouth clean with daily brushing and flossing  Home Care: The goal with treatment is to ease the pain where ulcers occur and help them heal as quickly as possible.  There is no medical treatment to prevent mouth ulcers from coming back or recurring.  Avoid spicy and acidic foods Eat soft foods and avoid rough, crunchy foods Avoid chewing gum Do not use toothpaste that contains sodium lauryl sulphite Use a straw to drink which helps avoid liquids toughing the ulcers near the front of your mouth Use a very soft toothbrush If you have dentures or dental hardware that you feel is not fitting well or contributing to his, please see your dentist. Use saltwater mouthwash which helps healing. Dissolve a  teaspoon of salt in a glass of warm water. Swish around your mouth and spit it out. This can be used as needed if it is soothing.   GET HELP RIGHT AWAY IF: Persistent ulcers require checking IN PERSON (face to face). Any mouth lesion lasting longer than a month should be seen by your DENTIST as soon as possible for evaluation for possible oral cancer. If you have a non-painful ulcer in 1 or more areas of your mouth Ulcers that are spreading, are very large or particularly  painful Ulcers last longer than one week without improving on treatment If you develop a fever, swollen glands and begin to feel unwell Ulcers that developed after starting a new medication MAKE SURE YOU: Understand these instructions. Will watch your condition. Will get help right away if you are not doing well or get worse.  Thank you for choosing an e-visit.  Your e-visit answers were reviewed by a board certified advanced clinical practitioner to complete your personal care plan. Depending upon the  condition, your plan could have included both over the counter or prescription medications.  Please review your pharmacy choice. Make sure the pharmacy is open so you can pick up prescription now. If there is a problem, you may contact your provider through [employer] and have the prescription routed to another pharmacy.  Your safety is important to [place]. If you have drug allergies check your prescription carefully.   For the next 24 hours you can use MyChart to ask questions about today's visit, request a non-urgent call back, or ask for a work or school excuse. You will get an email in the next two days asking about your experience. I hope that your e-visit has been valuable and will speed your recovery.  I spent approximately 5 minutes reviewing the patient's history, current symptoms and coordinating their care today.

## 2023-07-01 ENCOUNTER — Telehealth: Payer: BC Managed Care – PPO | Admitting: Physician Assistant

## 2023-07-01 DIAGNOSIS — R3989 Other symptoms and signs involving the genitourinary system: Secondary | ICD-10-CM

## 2023-07-01 MED ORDER — SULFAMETHOXAZOLE-TRIMETHOPRIM 800-160 MG PO TABS: 1.0000 | ORAL_TABLET | Freq: Two times a day (BID) | ORAL | 0 refills | Status: AC

## 2023-07-01 NOTE — Progress Notes (Signed)
I have spent 5 minutes in review of e-visit questionnaire, review and updating patient chart, medical decision making and response to patient.   William Cody Martin, PA-C    

## 2023-07-01 NOTE — Progress Notes (Signed)
E-Visit for Urinary Problems  We are sorry that you are not feeling well.  Here is how we plan to help!  Based on what you shared with me it looks like you most likely have a simple urinary tract infection.  A UTI (Urinary Tract Infection) is a bacterial infection of the bladder.  Most cases of urinary tract infections are simple to treat but a key part of your care is to encourage you to drink plenty of fluids and watch your symptoms carefully.  I have prescribed Bactrim DS One tablet twice a day for 5 days.  Your symptoms should gradually improve. Call us if the burning in your urine worsens, you develop worsening fever, back pain or pelvic pain or if your symptoms do not resolve after completing the antibiotic.  Urinary tract infections can be prevented by drinking plenty of water to keep your body hydrated.  Also be sure when you wipe, wipe from front to back and don't hold it in!  If possible, empty your bladder every 4 hours.  HOME CARE Drink plenty of fluids Compete the full course of the antibiotics even if the symptoms resolve Remember, when you need to go.go. Holding in your urine can increase the likelihood of getting a UTI! GET HELP RIGHT AWAY IF: You cannot urinate You get a high fever Worsening back pain occurs You see blood in your urine You feel sick to your stomach or throw up You feel like you are going to pass out  MAKE SURE YOU  Understand these instructions. Will watch your condition. Will get help right away if you are not doing well or get worse.   Thank you for choosing an e-visit.  Your e-visit answers were reviewed by a board certified advanced clinical practitioner to complete your personal care plan. Depending upon the condition, your plan could have included both over the counter or prescription medications.  Please review your pharmacy choice. Make sure the pharmacy is open so you can pick up prescription now. If there is a problem, you may contact  your provider through MyChart messaging and have the prescription routed to another pharmacy.  Your safety is important to us. If you have drug allergies check your prescription carefully.   For the next 24 hours you can use MyChart to ask questions about today's visit, request a non-urgent call back, or ask for a work or school excuse. You will get an email in the next two days asking about your experience. I hope that your e-visit has been valuable and will speed your recovery.  

## 2023-07-07 ENCOUNTER — Other Ambulatory Visit: Payer: Self-pay

## 2023-07-07 ENCOUNTER — Encounter: Payer: Self-pay | Admitting: Family

## 2023-07-07 ENCOUNTER — Telehealth: Payer: Self-pay | Admitting: *Deleted

## 2023-07-07 ENCOUNTER — Other Ambulatory Visit (HOSPITAL_COMMUNITY): Payer: Self-pay

## 2023-07-07 MED ORDER — DUPIXENT 300 MG/2ML ~~LOC~~ SOSY
300.0000 mg | PREFILLED_SYRINGE | SUBCUTANEOUS | 11 refills | Status: DC
  Filled 2023-07-07: qty 4, 28d supply, fill #0

## 2023-07-07 NOTE — Telephone Encounter (Signed)
-----   Message from Ferol Luz sent at 06/25/2023  8:27 AM EDT ----- I would like to start this patient on dupixent. 2 OCS this past year, AEC 200.  Received loading dose on 06/23/23.  Thanks!

## 2023-07-07 NOTE — Telephone Encounter (Signed)
Called patient and advised approval, copay card and submit to [facility] for Dupixent. Patient instructed on delivery, storage, dosing for same once she receives medication

## 2023-07-07 NOTE — Telephone Encounter (Signed)
Thanks

## 2023-07-09 ENCOUNTER — Other Ambulatory Visit (HOSPITAL_COMMUNITY): Payer: Self-pay

## 2023-07-09 ENCOUNTER — Other Ambulatory Visit: Payer: Self-pay

## 2023-07-09 DIAGNOSIS — L719 Rosacea, unspecified: Secondary | ICD-10-CM | POA: Diagnosis not present

## 2023-07-09 NOTE — Telephone Encounter (Signed)
Advised patient Karla Huber cannot process so will send to [facility]

## 2023-07-10 ENCOUNTER — Other Ambulatory Visit (HOSPITAL_COMMUNITY): Payer: Self-pay

## 2023-07-13 ENCOUNTER — Encounter: Payer: Self-pay | Admitting: Internal Medicine

## 2023-07-30 ENCOUNTER — Ambulatory Visit (INDEPENDENT_AMBULATORY_CARE_PROVIDER_SITE_OTHER): Payer: BC Managed Care – PPO | Admitting: Family Medicine

## 2023-07-30 ENCOUNTER — Encounter: Payer: Self-pay | Admitting: Family Medicine

## 2023-07-30 VITALS — BP 101/68 | HR 92 | Ht 62.0 in | Wt 163.6 lb

## 2023-07-30 DIAGNOSIS — J4520 Mild intermittent asthma, uncomplicated: Secondary | ICD-10-CM | POA: Diagnosis not present

## 2023-07-30 DIAGNOSIS — D7589 Other specified diseases of blood and blood-forming organs: Secondary | ICD-10-CM | POA: Diagnosis not present

## 2023-07-30 DIAGNOSIS — K909 Intestinal malabsorption, unspecified: Secondary | ICD-10-CM

## 2023-07-30 DIAGNOSIS — Z8639 Personal history of other endocrine, nutritional and metabolic disease: Secondary | ICD-10-CM | POA: Diagnosis not present

## 2023-07-30 DIAGNOSIS — Z23 Encounter for immunization: Secondary | ICD-10-CM

## 2023-07-30 NOTE — Assessment & Plan Note (Signed)
Improving on dupixent. Continue to follow with pulmonary. Call with any concerns.

## 2023-07-30 NOTE — Progress Notes (Signed)
BP 101/68   Pulse 92   Ht 5\' 2"  (1.575 m)   Wt 163 lb 9.6 oz (74.2 kg)   SpO2 98%   BMI 29.92 kg/m    Subjective:    Patient ID: Karla Huber, female    DOB: 1986-06-23, 37 y.o.   MRN: [id number]  HPI: Karla Huber is a 37 y.o. female  Chief Complaint  Patient presents with   Asthma   Back Pain   ASTHMA Asthma status: stable Satisfied with current treatment?: yes Albuterol/rescue inhaler frequency: 1-2x a week Dyspnea frequency: here and there Wheezing frequency: 1-2x a week Cough frequency: none Nocturnal symptom frequency: none  Limitation of activity: no Current upper respiratory symptoms: no Aerochamber/spacer use: no Visits to ER or Urgent Care in past year: yes Pneumovax: Up to Date Influenza: Up to Date   Relevant past medical, surgical, family and social history reviewed and updated as indicated. Interim medical history since our last visit reviewed. Allergies and medications reviewed and updated.  Review of Systems  Constitutional: Negative.   Respiratory: Negative.    Cardiovascular: Negative.   Gastrointestinal: Negative.   Musculoskeletal: Negative.   Psychiatric/Behavioral: Negative.      Per HPI unless specifically indicated above     Objective:    BP 101/68   Pulse 92   Ht 5\' 2"  (1.575 m)   Wt 163 lb 9.6 oz (74.2 kg)   SpO2 98%   BMI 29.92 kg/m   Wt Readings from Last 3 Encounters:  07/30/23 163 lb 9.6 oz (74.2 kg)  06/23/23 164 lb 4.8 oz (74.5 kg)  06/20/23 163 lb (73.9 kg)    Physical Exam Vitals and nursing note reviewed.  Constitutional:      General: She is not in acute distress.    Appearance: Normal appearance. She is not ill-appearing, toxic-appearing or diaphoretic.  HENT:     Head: Normocephalic and atraumatic.     Right Ear: External ear normal.     Left Ear: External ear normal.     Nose: Nose normal.     Mouth/Throat:     Mouth: Mucous membranes are moist.     Pharynx: Oropharynx is clear.  Eyes:      General: No scleral icterus.       Right eye: No discharge.        Left eye: No discharge.     Extraocular Movements: Extraocular movements intact.     Conjunctiva/sclera: Conjunctivae normal.     Pupils: Pupils are equal, round, and reactive to light.  Cardiovascular:     Rate and Rhythm: Normal rate and regular rhythm.     Pulses: Normal pulses.     Heart sounds: Normal heart sounds. No murmur heard.    No friction rub. No gallop.  Pulmonary:     Effort: Pulmonary effort is normal. No respiratory distress.     Breath sounds: Normal breath sounds. No stridor. No wheezing, rhonchi or rales.  Chest:     Chest wall: No tenderness.  Musculoskeletal:        General: Normal range of motion.     Cervical back: Normal range of motion and neck supple.  Skin:    General: Skin is warm and dry.     Capillary Refill: Capillary refill takes less than 2 seconds.     Coloration: Skin is not jaundiced or pale.     Findings: No bruising, erythema, lesion or rash.  Neurological:     General: No focal deficit present.  Mental Status: She is alert and oriented to person, place, and time. Mental status is at baseline.  Psychiatric:        Mood and Affect: Mood normal.        Behavior: Behavior normal.        Thought Content: Thought content normal.        Judgment: Judgment normal.     Results for orders placed or performed in visit on 01/28/23  CBC with Differential/Platelet  Result Value Ref Range   WBC 11.8 (H) 3.4 - 10.8 x10E3/uL   RBC 4.46 3.77 - 5.28 x10E6/uL   Hemoglobin 14.8 11.1 - 15.9 g/dL   Hematocrit 16.1 09.6 - 46.6 %   MCV 99 (H) 79 - 97 fL   MCH 33.2 (H) 26.6 - 33.0 pg   MCHC 33.6 31.5 - 35.7 g/dL   RDW 04.5 40.9 - 81.1 %   Platelets 385 150 - 450 x10E3/uL   Neutrophils 74 Not Estab. %   Lymphs 19 Not Estab. %   Monocytes 6 Not Estab. %   Eos 1 Not Estab. %   Basos 0 Not Estab. %   Neutrophils Absolute 8.7 (H) 1.4 - 7.0 x10E3/uL   Lymphocytes Absolute 2.3 0.7 - 3.1  x10E3/uL   Monocytes Absolute 0.7 0.1 - 0.9 x10E3/uL   EOS (ABSOLUTE) 0.2 0.0 - 0.4 x10E3/uL   Basophils Absolute 0.1 0.0 - 0.2 x10E3/uL   Immature Granulocytes 0 Not Estab. %   Immature Grans (Abs) 0.0 0.0 - 0.1 x10E3/uL  Comprehensive metabolic panel  Result Value Ref Range   Glucose 85 70 - 99 mg/dL   BUN 11 6 - 20 mg/dL   Creatinine, Ser 9.14 0.57 - 1.00 mg/dL   eGFR 782 >95 AO/ZHY/8.65   BUN/Creatinine Ratio 15 9 - 23   Sodium 138 134 - 144 mmol/L   Potassium 4.2 3.5 - 5.2 mmol/L   Chloride 99 96 - 106 mmol/L   CO2 21 20 - 29 mmol/L   Calcium 10.5 (H) 8.7 - 10.2 mg/dL   Total Protein 7.5 6.0 - 8.5 g/dL   Albumin 4.9 3.9 - 4.9 g/dL   Globulin, Total 2.6 1.5 - 4.5 g/dL   Albumin/Globulin Ratio 1.9 1.2 - 2.2   Bilirubin Total 0.3 0.0 - 1.2 mg/dL   Alkaline Phosphatase 74 44 - 121 IU/L   AST 20 0 - 40 IU/L   ALT 25 0 - 32 IU/L  Lipid Panel w/o Chol/HDL Ratio  Result Value Ref Range   Cholesterol, Total 188 100 - 199 mg/dL   Triglycerides 784 0 - 149 mg/dL   HDL 79 >69 mg/dL   VLDL Cholesterol Cal 24 5 - 40 mg/dL   LDL Chol Calc (NIH) 85 0 - 99 mg/dL  Urinalysis, Routine w reflex microscopic  Result Value Ref Range   Specific Gravity, UA <1.005 (L) 1.005 - 1.030   pH, UA 5.0 5.0 - 7.5   Color, UA Yellow Yellow   Appearance Ur Clear Clear   Leukocytes,UA Negative Negative   Protein,UA Negative Negative/Trace   Glucose, UA Negative Negative   Ketones, UA 1+ (A) Negative   RBC, UA Negative Negative   Bilirubin, UA Negative Negative   Urobilinogen, Ur 0.2 0.2 - 1.0 mg/dL   Nitrite, UA Negative Negative   Microscopic Examination Comment   TSH  Result Value Ref Range   TSH 1.470 0.450 - 4.500 uIU/mL      Assessment & Plan:   Problem List Items Addressed This  Visit       Respiratory   Asthma - Primary    Improving on dupixent. Continue to follow with pulmonary. Call with any concerns.       Relevant Medications   BREZTRI AEROSPHERE 160-9-4.8 MCG/ACT AERO      Digestive   Malabsorption of iron (Chronic)    Rechecking labs today. Await results.       Relevant Orders   Iron Binding Cap (TIBC)([facility])   Ferritin   Other Visit Diagnoses     Macrocytosis       Rechecking labs today. Await results.   Relevant Orders   B12   CBC with Differential/Platelet   Comprehensive metabolic panel   History of hyperglycemia       A1c drawn today. Await results.   Relevant Orders   Hgb A1c w/o eAG   Needs flu shot       Flu shot given today.   Relevant Orders   Flu vaccine trivalent PF, 6mos and older(Flulaval,Afluria,Fluarix,Fluzone) (Completed)        Follow up plan: Return in about 6 months (around 01/28/2024) for physical.

## 2023-07-30 NOTE — Assessment & Plan Note (Signed)
Rechecking labs today. Await results.  

## 2023-07-31 LAB — CBC WITH DIFFERENTIAL/PLATELET
Basophils Absolute: 0 10*3/uL (ref 0.0–0.2)
Basos: 1 %
EOS (ABSOLUTE): 0.1 10*3/uL (ref 0.0–0.4)
Eos: 1 %
Hematocrit: 45.2 % (ref 34.0–46.6)
Hemoglobin: 14.5 g/dL (ref 11.1–15.9)
Immature Grans (Abs): 0 10*3/uL (ref 0.0–0.1)
Immature Granulocytes: 0 %
Lymphocytes Absolute: 2.1 10*3/uL (ref 0.7–3.1)
Lymphs: 24 %
MCH: 32.6 pg (ref 26.6–33.0)
MCHC: 32.1 g/dL (ref 31.5–35.7)
MCV: 102 fL — ABNORMAL HIGH (ref 79–97)
Monocytes Absolute: 0.6 10*3/uL (ref 0.1–0.9)
Monocytes: 7 %
Neutrophils Absolute: 5.8 10*3/uL (ref 1.4–7.0)
Neutrophils: 67 %
Platelets: 409 10*3/uL (ref 150–450)
RBC: 4.45 x10E6/uL (ref 3.77–5.28)
RDW: 11.8 % (ref 11.7–15.4)
WBC: 8.6 10*3/uL (ref 3.4–10.8)

## 2023-07-31 LAB — COMPREHENSIVE METABOLIC PANEL
ALT: 23 [IU]/L (ref 0–32)
AST: 22 [IU]/L (ref 0–40)
Albumin: 4.7 g/dL (ref 3.9–4.9)
Alkaline Phosphatase: 71 [IU]/L (ref 44–121)
BUN/Creatinine Ratio: 8 — ABNORMAL LOW (ref 9–23)
BUN: 5 mg/dL — ABNORMAL LOW (ref 6–20)
Bilirubin Total: 0.3 mg/dL (ref 0.0–1.2)
CO2: 24 mmol/L (ref 20–29)
Calcium: 10.2 mg/dL (ref 8.7–10.2)
Chloride: 103 mmol/L (ref 96–106)
Creatinine, Ser: 0.66 mg/dL (ref 0.57–1.00)
Globulin, Total: 2.6 g/dL (ref 1.5–4.5)
Glucose: 89 mg/dL (ref 70–99)
Potassium: 4.5 mmol/L (ref 3.5–5.2)
Sodium: 140 mmol/L (ref 134–144)
Total Protein: 7.3 g/dL (ref 6.0–8.5)
eGFR: 115 mL/min/{1.73_m2} (ref >59)

## 2023-07-31 LAB — IRON AND TIBC
Iron Saturation: 27 % (ref 15–55)
Iron: 86 ug/dL (ref 27–159)
Total Iron Binding Capacity: 314 ug/dL (ref 250–450)
UIBC: 228 ug/dL (ref 131–425)

## 2023-07-31 LAB — HGB A1C W/O EAG: Hgb A1c MFr Bld: 5 % (ref 4.8–5.6)

## 2023-07-31 LAB — VITAMIN B12: Vitamin B-12: 684 pg/mL (ref 232–1245)

## 2023-07-31 LAB — FERRITIN: Ferritin: 215 ng/mL — ABNORMAL HIGH (ref 15–150)

## 2023-08-22 ENCOUNTER — Encounter: Payer: Self-pay | Admitting: Family Medicine

## 2023-08-22 ENCOUNTER — Ambulatory Visit (INDEPENDENT_AMBULATORY_CARE_PROVIDER_SITE_OTHER): Payer: BC Managed Care – PPO | Admitting: Family Medicine

## 2023-08-22 VITALS — BP 99/62 | HR 77 | Ht 62.0 in | Wt 166.0 lb

## 2023-08-22 DIAGNOSIS — M9901 Segmental and somatic dysfunction of cervical region: Secondary | ICD-10-CM | POA: Diagnosis not present

## 2023-08-22 DIAGNOSIS — M9900 Segmental and somatic dysfunction of head region: Secondary | ICD-10-CM

## 2023-08-22 DIAGNOSIS — M9902 Segmental and somatic dysfunction of thoracic region: Secondary | ICD-10-CM | POA: Diagnosis not present

## 2023-08-22 DIAGNOSIS — M9909 Segmental and somatic dysfunction of abdomen and other regions: Secondary | ICD-10-CM

## 2023-08-22 DIAGNOSIS — M9904 Segmental and somatic dysfunction of sacral region: Secondary | ICD-10-CM

## 2023-08-22 DIAGNOSIS — M545 Low back pain, unspecified: Secondary | ICD-10-CM | POA: Diagnosis not present

## 2023-08-22 DIAGNOSIS — M9903 Segmental and somatic dysfunction of lumbar region: Secondary | ICD-10-CM

## 2023-08-22 DIAGNOSIS — M9908 Segmental and somatic dysfunction of rib cage: Secondary | ICD-10-CM

## 2023-08-22 DIAGNOSIS — M9905 Segmental and somatic dysfunction of pelvic region: Secondary | ICD-10-CM

## 2023-08-22 NOTE — Progress Notes (Signed)
BP 99/62   Pulse 77   Ht 5\' 2"  (1.575 m)   Wt 166 lb (75.3 kg)   SpO2 98%   BMI 30.36 kg/m    Subjective:    Patient ID: Karla Huber, female    DOB: [birth year], 37 y.o.   MRN: [id number]  HPI: Karla Huber is a 37 y.o. female  Chief Complaint  Patient presents with   Back Pain   Karla Huber presents today for evaluation and possible treatment with OMT for low back pain. She notes that she has been having pain in her sacrum for about 2 weeks. She went to sit down and was higher than she thought she was. She then hit on hard on her bottom. She notes that the pain is sharp and shooting. It is traveling down her legs into her calves bilaterally. It's worse with movement but is pretty constant and better with laying down. She is otherwise feeling well with no other concerns or complaints at this time.    Relevant past medical, surgical, family and social history reviewed and updated as indicated. Interim medical history since our last visit reviewed. Allergies and medications reviewed and updated.  Review of Systems  Constitutional: Negative.   HENT: Negative.    Respiratory: Negative.    Cardiovascular: Negative.   Musculoskeletal:  Positive for back pain and myalgias. Negative for arthralgias, gait problem, joint swelling, neck pain and neck stiffness.  Skin: Negative.   Psychiatric/Behavioral: Negative.      Per HPI unless specifically indicated above     Objective:    BP 99/62   Pulse 77   Ht 5\' 2"  (1.575 m)   Wt 166 lb (75.3 kg)   SpO2 98%   BMI 30.36 kg/m   Wt Readings from Last 3 Encounters:  08/22/23 166 lb (75.3 kg)  07/30/23 163 lb 9.6 oz (74.2 kg)  06/23/23 164 lb 4.8 oz (74.5 kg)    Physical Exam Vitals and nursing note reviewed.  Constitutional:      General: She is not in acute distress.    Appearance: Normal appearance. She is not ill-appearing.  HENT:     Head: Normocephalic and atraumatic.     Right Ear: External ear normal.     Left Ear:  External ear normal.     Nose: Nose normal.     Mouth/Throat:     Mouth: Mucous membranes are moist.     Pharynx: Oropharynx is clear.  Eyes:     Extraocular Movements: Extraocular movements intact.     Conjunctiva/sclera: Conjunctivae normal.     Pupils: Pupils are equal, round, and reactive to light.  Neck:     Vascular: No carotid bruit.  Cardiovascular:     Rate and Rhythm: Normal rate.     Pulses: Normal pulses.  Pulmonary:     Effort: Pulmonary effort is normal. No respiratory distress.  Abdominal:     General: Abdomen is flat. There is no distension.     Palpations: Abdomen is soft. There is no mass.     Tenderness: There is no abdominal tenderness. There is no right CVA tenderness, left CVA tenderness, guarding or rebound.     Hernia: No hernia is present.  Musculoskeletal:     Cervical back: No muscular tenderness.  Lymphadenopathy:     Cervical: No cervical adenopathy.  Skin:    General: Skin is warm and dry.     Capillary Refill: Capillary refill takes less than 2 seconds.  Coloration: Skin is not jaundiced or pale.     Findings: No bruising, erythema, lesion or rash.  Neurological:     General: No focal deficit present.     Mental Status: She is alert. Mental status is at baseline.  Psychiatric:        Mood and Affect: Mood normal.        Behavior: Behavior normal.        Thought Content: Thought content normal.        Judgment: Judgment normal.   Musculoskeletal:  Exam found Decreased ROM, Tissue texture changes, Tenderness to palpation, and Asymmetry of patient's  head, neck, thorax, ribs, lumbar, pelvis, sacrum, and abdomen Osteopathic Structural Exam:   Head:OAESSR, hypertonic suboccipital muscles. SBS SRR  Neck: C4ESRR, scm hypertonic on the R  Thorax: T3-5SRRL, trap hypertonic on the L  Ribs: Ribs 6-8 locked up on the R, rib 6 locked up on the L  Lumbar: QL hypertonic on the R, L3-5SRRL  Pelvis: Posterior L innominte, SI joint restricted on the  L  Sacrum: L unilateral extension, SI joint restricted on the L  Abdomen: diaphragm and pelvic diaphragm restricted L>R  Results for orders placed or performed in visit on 07/30/23  B12  Result Value Ref Range   Vitamin B-12 684 232 - 1,245 pg/mL  CBC with Differential/Platelet  Result Value Ref Range   WBC 8.6 3.4 - 10.8 x10E3/uL   RBC 4.45 3.77 - 5.28 x10E6/uL   Hemoglobin 14.5 11.1 - 15.9 g/dL   Hematocrit 16.1 09.6 - 46.6 %   MCV 102 (H) 79 - 97 fL   MCH 32.6 26.6 - 33.0 pg   MCHC 32.1 31.5 - 35.7 g/dL   RDW 04.5 40.9 - 81.1 %   Platelets 409 150 - 450 x10E3/uL   Neutrophils 67 Not Estab. %   Lymphs 24 Not Estab. %   Monocytes 7 Not Estab. %   Eos 1 Not Estab. %   Basos 1 Not Estab. %   Neutrophils Absolute 5.8 1.4 - 7.0 x10E3/uL   Lymphocytes Absolute 2.1 0.7 - 3.1 x10E3/uL   Monocytes Absolute 0.6 0.1 - 0.9 x10E3/uL   EOS (ABSOLUTE) 0.1 0.0 - 0.4 x10E3/uL   Basophils Absolute 0.0 0.0 - 0.2 x10E3/uL   Immature Granulocytes 0 Not Estab. %   Immature Grans (Abs) 0.0 0.0 - 0.1 x10E3/uL  Comprehensive metabolic panel  Result Value Ref Range   Glucose 89 70 - 99 mg/dL   BUN 5 (L) 6 - 20 mg/dL   Creatinine, Ser 9.14 0.57 - 1.00 mg/dL   eGFR 782 >95 AO/ZHY/8.65   BUN/Creatinine Ratio 8 (L) 9 - 23   Sodium 140 134 - 144 mmol/L   Potassium 4.5 3.5 - 5.2 mmol/L   Chloride 103 96 - 106 mmol/L   CO2 24 20 - 29 mmol/L   Calcium 10.2 8.7 - 10.2 mg/dL   Total Protein 7.3 6.0 - 8.5 g/dL   Albumin 4.7 3.9 - 4.9 g/dL   Globulin, Total 2.6 1.5 - 4.5 g/dL   Bilirubin Total 0.3 0.0 - 1.2 mg/dL   Alkaline Phosphatase 71 44 - 121 IU/L   AST 22 0 - 40 IU/L   ALT 23 0 - 32 IU/L  Iron Binding Cap (TIBC)([facility])  Result Value Ref Range   Total Iron Binding Capacity 314 250 - 450 ug/dL   UIBC 784 696 - 295 ug/dL   Iron 86 27 - 284 ug/dL   Iron Saturation 27 15 -  55 %  Ferritin  Result Value Ref Range   Ferritin 215 (H) 15 - 150 ng/mL  Hgb A1c w/o eAG  Result Value Ref  Range   Hgb A1c MFr Bld 5.0 4.8 - 5.6 %      Assessment & Plan:   Problem List Items Addressed This Visit   None Visit Diagnoses     Acute bilateral low back pain without sciatica    -  Primary   In acute exacerbation. She does have somatic dysfunction that is contributing to her symptoms. Treated today with good results today as below. Call w concerns.   Cervical segment dysfunction       Thoracic segment dysfunction       Somatic dysfunction of lumbar region       Somatic dysfunction of sacral region       Somatic dysfunction of pelvis region       Head region somatic dysfunction       Rib cage region somatic dysfunction       Segmental dysfunction of abdomen          After verbal consent was obtained, patient was treated today with osteopathic manipulative medicine to the regions of the head, neck, thorax, ribs, lumbar, pelvis, sacrum, and abdomen using the techniques of cranial, myofascial release, counterstrain, muscle energy, HVLA, and soft tissue. Areas of compensation relating to her primary pain source also treated. Patient tolerated the procedure well with good objective and good subjective improvement in symptoms. She left the room in good condition. She was advised to stay well hydrated and that she may have some soreness following the procedure. If not improving or worsening, she will call and come in. She will return for reevaluation  on a PRN basis.   Follow up plan: Return if symptoms worsen or fail to improve.

## 2023-08-29 ENCOUNTER — Encounter: Payer: Self-pay | Admitting: Family Medicine

## 2023-09-02 ENCOUNTER — Ambulatory Visit (HOSPITAL_BASED_OUTPATIENT_CLINIC_OR_DEPARTMENT_OTHER)
Admission: RE | Admit: 2023-09-02 | Discharge: 2023-09-02 | Disposition: A | Discharge status: Home / Self Care | Payer: BC Managed Care – PPO | Source: Ambulatory Visit | Attending: Family Medicine | Admitting: Family Medicine

## 2023-09-02 ENCOUNTER — Ambulatory Visit (INDEPENDENT_AMBULATORY_CARE_PROVIDER_SITE_OTHER): Payer: BC Managed Care – PPO | Admitting: Family Medicine

## 2023-09-02 ENCOUNTER — Other Ambulatory Visit: Payer: Self-pay

## 2023-09-02 ENCOUNTER — Encounter: Payer: Self-pay | Admitting: Family Medicine

## 2023-09-02 VITALS — BP 128/72 | HR 92 | Temp 98.8°F | Resp 20 | Wt 166.0 lb

## 2023-09-02 DIAGNOSIS — K219 Gastro-esophageal reflux disease without esophagitis: Secondary | ICD-10-CM | POA: Insufficient documentation

## 2023-09-02 DIAGNOSIS — J4551 Severe persistent asthma with (acute) exacerbation: Secondary | ICD-10-CM | POA: Diagnosis not present

## 2023-09-02 DIAGNOSIS — J4550 Severe persistent asthma, uncomplicated: Secondary | ICD-10-CM | POA: Insufficient documentation

## 2023-09-02 DIAGNOSIS — J3089 Other allergic rhinitis: Secondary | ICD-10-CM

## 2023-09-02 DIAGNOSIS — R0602 Shortness of breath: Secondary | ICD-10-CM | POA: Insufficient documentation

## 2023-09-02 DIAGNOSIS — B349 Viral infection, unspecified: Secondary | ICD-10-CM | POA: Insufficient documentation

## 2023-09-02 MED ORDER — PREDNISONE 10 MG PO TABS: ORAL_TABLET | ORAL | 0 refills | Status: DC

## 2023-09-02 MED ORDER — ALBUTEROL SULFATE (2.5 MG/3ML) 0.083% IN NEBU: 2.5000 mg | INHALATION_SOLUTION | RESPIRATORY_TRACT | 1 refills | Status: DC | PRN

## 2023-09-02 MED ORDER — BREZTRI AEROSPHERE 160-9-4.8 MCG/ACT IN AERO: 1.0000 | INHALATION_SPRAY | Freq: Two times a day (BID) | RESPIRATORY_TRACT | 3 refills | Status: DC

## 2023-09-02 NOTE — Patient Instructions (Addendum)
Asthma Get a chest x-ray. We will call you when results become available Begin prednisone 10 mg tablets. Take 2 tablets twice a day for 3 days, then take 2 tablets once a day for 1 day, then take 1 tablet on the 5th day, then stop  Continue Breztri 2 puffs twice a day with a spacer to prevent cough or wheeze.  Pretreat with albuterol or DuoNeb before using Breztri for the next several days or until cough and wheeze free Continue montelukast 10 mg once a day to prevent cough or wheeze Continue albuterol 2 puffs every 4 hours as needed for cough or wheeze OR Instead use albuterol 0.083% solution via nebulizer one unit vial every 4 hours as needed for cough or wheeze Continue Dupixent once every 4 weeks for asthma control   Allergic rhinitis Continue carbinoxamine 4-8 mg twice a day if needed for nasal symptoms Continue Flonase 2.5 mg once a day for stuffy nose Continue ipratropium 2 sprays each nostril twice a day as needed Consider saline nasal rinses as needed for nasal symptoms. Use this before any medicated nasal sprays for best result Consider updating your environmental allergy testing when you are breathing has returned to baseline  Reflux Continue dietary and lifestyle modifications as listed below Continue Prilosec as previously prescribed  Viral illness Your COVID testing was negative at today's visit Consider further testing for respiratory viruses  Call the clinic if this treatment plan is not working well for you.  Follow up in 2 weeks or sooner if needed.

## 2023-09-02 NOTE — Progress Notes (Signed)
Patient notified of chest x-ray with no cardiopulmonary disease.  Patient agrees to the plan outlined at today's visit with the exception of using DuoNeb twice a day before using Breztri for the next several days or until cough and wheeze free.  Patient will call with worsening symptoms or no improvement of symptoms.

## 2023-09-02 NOTE — Progress Notes (Signed)
[address] [place] [address] Dept: [phone]  FOLLOW UP NOTE  Patient ID: Karla Huber, female    DOB: October 28, 1986  Age: 37 y.o. MRN: [id number] Date of Office Visit: 09/02/2023  Assessment  Chief Complaint: Chest Pain (Same day visit for chest tightness, dry, tight cough "feels like lungs are burning "on dupixent injections every 2 weeks. States 100.4 temp at home but thinks thermometer is wrong she feels cold.) and Medication Refill  HPI Karla Huber is a 37 year old female who presents to the clinic for an acute asthma exacerbation.  She was last seen in this clinic on 06/23/2023 by Dr. Marlynn Perking for evaluation of not well-controlled asthma and chronic rhinitis.  Discussed the use of AI scribe software for clinical note transcription with the patient, who gave verbal consent to proceed.  History of Present Illness   The patient, with a history of asthma, presents with new onset chest tightness and back pain that started over the weekend. She describes the sensation as 'burning lungs' and difficulty taking deep breaths. The symptoms are associated with shortness of breath, which is worse with activity and improves with rest. She also reports a dry, tight cough. Despite using Breztri inhaler twice daily, montelukast 10 mg once a day, and albuterol every four to six hours, the relief is only marginal.  She continues Dupixent injections 300 mg once every 14 days with no large or local reactions.  She reports a moderate decrease in her symptoms of asthma while continuing on Dupixent injections.  The patient also reports a low-grade fever at home, but is unsure of the accuracy of her thermometer. She denies any sweats or chills, but notes feeling unusually cold.The patient works outside the home and reports multiple coworkers have been ill. She also notes that her children have recently been ill with pneumonia and bronchitis, suggesting a possible source of exposure.  The patient's  last asthma flare was in August, following a COVID-19 infection. At that time, she was treated with antibiotics and prednisone.  Allergic rhinitis is reported as moderately well-controlled with occasional nasal symptoms.  She has been using Flonase and ipratropium nasal sprays daily, along with montelukast, and carbinoxamine twice daily for control of allergic rhinitis.  She has not had any recent testing due to uncontrolled asthma.  Reflux is reported as well-controlled with no symptoms including heartburn or vomiting.  She continues Prilosec daily with relief of symptoms.  Her current medications are listed in the chart.    Drug Allergies:  Allergies  Allergen Reactions   Hydrocodone-Acetaminophen Hives   Oxycodone-Acetaminophen Nausea And Vomiting    Projectile vomiting   Hydrocodone Hives    Physical Exam: BP 128/72 (BP Location: Left Arm, Patient Position: Sitting, Cuff Size: Normal)   Pulse 92   Temp 98.8 F (37.1 C) (Temporal)   Resp 20   Wt 166 lb (75.3 kg)   SpO2 98%   BMI 30.36 kg/m    Physical Exam Vitals reviewed.  Constitutional:      Appearance: Normal appearance. She is well-developed.  HENT:     Head: Normocephalic and atraumatic.     Right Ear: Tympanic membrane normal.     Nose:     Comments: Bilateral naris slightly erythematous with thin clear nasal drainage noted.  Pharynx normal.  Ears normal.  Eyes normal.    Mouth/Throat:     Pharynx: Oropharynx is clear.  Eyes:     Conjunctiva/sclera: Conjunctivae normal.  Cardiovascular:     Rate  and Rhythm: Normal rate and regular rhythm.     Heart sounds: Normal heart sounds. No murmur heard. Pulmonary:     Comments: Bilateral lungs clear to auscultation.  Reduced airflow noted. Musculoskeletal:        General: Normal range of motion.     Cervical back: Normal range of motion and neck supple.  Skin:    General: Skin is warm and dry.  Neurological:     Mental Status: She is alert and oriented to person,  place, and time.  Psychiatric:        Mood and Affect: Mood normal.        Behavior: Behavior normal.        Thought Content: Thought content normal.        Judgment: Judgment normal.     Diagnostics: FVC 2.86 which is 77% of predicted value, FEV1 2.36 which is 77% of predicted value.  Spirometry indicates possible restriction.  Assessment and Plan: 1. Severe persistent asthma with acute exacerbation   2. Shortness of breath   3. Other allergic rhinitis   4. Gastroesophageal reflux disease, unspecified whether esophagitis present   5. Viral illness     Meds ordered this encounter  Medications   BREZTRI AEROSPHERE 160-9-4.8 MCG/ACT AERO    Sig: Inhale 1-2 puffs into the lungs 2 (two) times daily at 10 AM and 5 PM.    Dispense:  10.7 g    Refill:  3   predniSONE (DELTASONE) 10 MG tablet    Sig: Begin prednisone 10 mg tablets. Take 2 tablets twice a day for 3 days, then take 2 tablets once a day for 1 day, then take 1 tablet on the 5th day, then stop    Dispense:  15 tablet    Refill:  0   albuterol (PROVENTIL) (2.5 MG/3ML) 0.083% nebulizer solution    Sig: Take 3 mLs (2.5 mg total) by nebulization every 4 (four) hours as needed for wheezing or shortness of breath.    Dispense:  75 mL    Refill:  1    Patient Instructions  Asthma Get a chest x-ray. We will call you when results become available Begin prednisone 10 mg tablets. Take 2 tablets twice a day for 3 days, then take 2 tablets once a day for 1 day, then take 1 tablet on the 5th day, then stop  Continue Breztri 2 puffs twice a day with a spacer to prevent cough or wheeze.  Pretreat with albuterol or DuoNeb before using Breztri for the next several days or until cough and wheeze free Continue montelukast 10 mg once a day to prevent cough or wheeze Continue albuterol 2 puffs every 4 hours as needed for cough or wheeze OR Instead use albuterol 0.083% solution via nebulizer one unit vial every 4 hours as needed for cough or  wheeze Continue Dupixent once every 4 weeks for asthma control   Allergic rhinitis Continue carbinoxamine 4-8 mg twice a day if needed for nasal symptoms Continue Flonase 2.5 mg once a day for stuffy nose Continue ipratropium 2 sprays each nostril twice a day as needed Consider saline nasal rinses as needed for nasal symptoms. Use this before any medicated nasal sprays for best result Consider updating your environmental allergy testing when you are breathing has returned to baseline  Reflux Continue dietary and lifestyle modifications as listed below Continue Prilosec as previously prescribed  Viral illness Your COVID testing was negative at today's visit Consider further testing for respiratory viruses  Call the clinic if this treatment plan is not working well for you.  Follow up in 2 weeks or sooner if needed.   Return in about 2 weeks (around 09/16/2023), or if symptoms worsen or fail to improve.    Thank you for the opportunity to care for this patient.  Please do not hesitate to contact me with questions.  Thermon Leyland, FNP [facility]

## 2023-09-03 ENCOUNTER — Telehealth: Payer: Self-pay

## 2023-09-03 NOTE — Telephone Encounter (Signed)
Pt is feeling better today then yesterday. The duoneb is working for her greatly. She dosent like plain albuterol

## 2023-09-04 ENCOUNTER — Encounter: Payer: Self-pay | Admitting: Family Medicine

## 2023-09-05 NOTE — Telephone Encounter (Signed)
Can we prescribe combivent inhaler 2 puffs every 4 hours as needed.  Thanks!

## 2023-09-08 ENCOUNTER — Other Ambulatory Visit: Payer: Self-pay | Admitting: *Deleted

## 2023-09-08 MED ORDER — COMBIVENT RESPIMAT 20-100 MCG/ACT IN AERS: 1.0000 | INHALATION_SPRAY | RESPIRATORY_TRACT | 2 refills | Status: DC | PRN

## 2023-09-09 ENCOUNTER — Ambulatory Visit (INDEPENDENT_AMBULATORY_CARE_PROVIDER_SITE_OTHER): Payer: BC Managed Care – PPO | Admitting: Internal Medicine

## 2023-09-09 ENCOUNTER — Encounter: Payer: Self-pay | Admitting: Internal Medicine

## 2023-09-09 ENCOUNTER — Other Ambulatory Visit: Payer: Self-pay

## 2023-09-09 VITALS — BP 110/68 | HR 105 | Temp 98.1°F | Resp 20 | Wt 168.8 lb

## 2023-09-09 DIAGNOSIS — J0110 Acute frontal sinusitis, unspecified: Secondary | ICD-10-CM

## 2023-09-09 DIAGNOSIS — K219 Gastro-esophageal reflux disease without esophagitis: Secondary | ICD-10-CM | POA: Diagnosis not present

## 2023-09-09 DIAGNOSIS — Z886 Allergy status to analgesic agent status: Secondary | ICD-10-CM

## 2023-09-09 DIAGNOSIS — J339 Nasal polyp, unspecified: Secondary | ICD-10-CM

## 2023-09-09 DIAGNOSIS — J4551 Severe persistent asthma with (acute) exacerbation: Secondary | ICD-10-CM | POA: Diagnosis not present

## 2023-09-09 DIAGNOSIS — J3089 Other allergic rhinitis: Secondary | ICD-10-CM | POA: Diagnosis not present

## 2023-09-09 NOTE — Progress Notes (Signed)
FOLLOW UP Date of Service/Encounter:  09/12/23  Subjective:  Karla Huber (DOB: April 10, 1986) is a 37 y.o. female who [facility] in re-evaluation of the following: Persistent asthma, allergic rhinitis, reflux History obtained from: chart review and patient.  For Review, LV was on 09/02/23  with Thermon Leyland, FNP seen for routine follow-up. See below for summary of history and diagnostics.   Therapeutic plans/changes recommended: She was acutely ill, COVID test was negative in clinic, chest x-ray was obtained which was normal, she was treated with prednisone for asthma flare ----------------------------------------------------- Pertinent History/Diagnostics:  Asthma/AERD : Dx at 37 years old, 2 OCS 2024, triggered by URI, seasonal changes, ibuprofen, peanut butter Rx: Breztri, DuoNebs, montelukast, Dupixent Previous Rx: Symbicort 160, Airsupra -Mild restriction spirometry (11//24): ratio 83, 2.36 L, 77% FEV1 (pre),  Allergic Rhinitis:  Year-round, triggers with weather changes, cats; hyposmia with nasal symptoms Rx: Carbinoxamine, Flonase, ipratropium, montelukast  - SPT environmental panel  not done yet  Other: GERD  --------------------------------------------------- Today presents for follow-up. Discussed the use of AI scribe software for clinical note transcription with the patient, who gave verbal consent to proceed.  History of Present Illness   The patient, with a history of asthma and eczema, presented with persistent respiratory symptoms despite recent treatment with prednisone. They reported an improvement in symptoms while on prednisone, but experienced a return of chest tightness upon discontinuation of the medication. The tightness was described as severe, making it difficult to hold a conversation and causing shortness of breath. The patient noted that DuoNebs provided some relief, but not complete resolution of symptoms.  In  addition to respiratory symptoms, the patient reported sinus pressure and swelling in the area above the eyelid, which had been a recurring issue. They denied having a runny or stuffy nose, itchy or watery eyes. They also reported that they had been adhering to their prescribed regimen of Breztri, montelukast, carboxylamine, Flonase, and ipratropium.  The patient also mentioned that they had been using albuterol every four to six hours, even after finishing the prednisone course. They reported that the albuterol seemed to be less effective recently.  The patient has been on Dupixent since the end of August for their eczema, which they reported has improved, along with a reduction in eye flare-ups. However, they did not notice a significant improvement in their asthma symptoms with Dupixent.  The patient denied taking any NSAIDs, which they are allergic to, and reported only taking Tylenol for pain management. They also denied any recent use of Excedrin Migraine, which they keep on hand for severe migraines.  The patient's family members had recently been ill with pneumonia and bronchitis, but the patient did not develop similar symptoms. They did, however, note that their respiratory symptoms seemed to worsen around the same time.          All medications reviewed by clinical staff and updated in chart. No new pertinent medical or surgical history except as noted in HPI.  ROS: All others negative except as noted per HPI.   Objective:  BP 110/68 (BP Location: Left Arm, Patient Position: Sitting, Cuff Size: Normal)   Pulse (!) 105   Temp 98.1 F (36.7 C) (Temporal)   Resp 20   Wt 168 lb 12.8 oz (76.6 kg)   SpO2 98%   BMI 30.87 kg/m  Body mass index is 30.87 kg/m. Physical Exam: General Appearance:  Alert, cooperative, no distress, appears stated age  Head:  Normocephalic, without  obvious abnormality, atraumatic  Eyes:  Conjunctiva clear, EOM's intact  Ears EACs normal bilaterally   Nose: Nares normal, normal mucosa, no visible anterior polyps, and septum midline  Throat: Lips, tongue normal; teeth and gums normal, normal posterior oropharynx  Neck: Supple, symmetrical  Lungs:   clear to auscultation bilaterally, Respirations unlabored, no coughing  Heart:  regular rate and rhythm and no murmur, Appears well perfused  Extremities: No edema  Skin: Skin color, texture, turgor normal and no rashes or lesions on visualized portions of skin  Neurologic: No gross deficits   Labs:  Lab Orders  No laboratory test(s) ordered today    Spirometry:  Tracings reviewed. Her effort: Good reproducible efforts. FVC: 3.49 L FEV1: 3.02 L, 106% predicted FEV1/FVC ratio: 87% Interpretation: Spirometry consistent with normal pattern.  Please see scanned spirometry results for details.   Assessment/Plan   Acute Frontal Sinusitits   Start Doxycycline 100mg  twice daily    Asthma, severe persistent, improving but still not at goal with previous exacerbation Add on Symbicort 2 puffs twice daily for next 2 weeks  Continue Breztri 2 puffs twice a day with a spacer to prevent cough or wheeze.   Continue montelukast 10 mg once a day to prevent cough or wheeze Continue Combivent  2 puffs every 4 hours as needed for cough or wheeze OR Instead use duoneb solution via nebulizer one unit vial every 4 hours as needed for cough or wheeze Continue Dupixent once every 4 weeks for asthma control for now  We will see about switching to tezspire for better infection induced asthma control   Allergic rhinitis Continue carbinoxamine 4-8 mg twice a day if needed for nasal symptoms Continue Flonase 2.5 mg once a day for stuffy nose Continue ipratropium 2 sprays each nostril twice a day as needed Consider saline nasal rinses as needed for nasal symptoms. Use this before any medicated nasal sprays for best result Consider updating your environmental allergy testing when you are breathing has  returned to baseline  Reflux Continue dietary and lifestyle modifications as listed below Continue Prilosec as previously prescribed  Follow up: 3 months   Other:  none    Thank you so much for letting me partake in your care today.  Don't hesitate to reach out if you have any additional concerns!  Ferol Luz, MD  [facility] [place] [place]

## 2023-09-09 NOTE — Patient Instructions (Addendum)
Acute Frontal Sinusitits   Start Doxycycline 100mg  twice daily    Asthma, severe persistent, improving but still not at goal with previous exacerbation Add on Symbicort 2 puffs twice daily for next 2 weeks  Continue Breztri 2 puffs twice a day with a spacer to prevent cough or wheeze.   Continue montelukast 10 mg once a day to prevent cough or wheeze Continue Combivent  2 puffs every 4 hours as needed for cough or wheeze OR Instead use duoneb solution via nebulizer one unit vial every 4 hours as needed for cough or wheeze Continue Dupixent once every 4 weeks for asthma control for now  We will see about switching to tezspire for better infection induced asthma control   Allergic rhinitis Continue carbinoxamine 4-8 mg twice a day if needed for nasal symptoms Continue Flonase 2.5 mg once a day for stuffy nose Continue ipratropium 2 sprays each nostril twice a day as needed Consider saline nasal rinses as needed for nasal symptoms. Use this before any medicated nasal sprays for best result Consider updating your environmental allergy testing when you are breathing has returned to baseline  Reflux Continue dietary and lifestyle modifications as listed below Continue Prilosec as previously prescribed  Follow up: 3 months   Thank you so much for letting me partake in your care today.  Don't hesitate to reach out if you have any additional concerns!  Ferol Luz, MD  [facility] [place] [place]

## 2023-09-10 ENCOUNTER — Telehealth: Payer: Self-pay | Admitting: Internal Medicine

## 2023-09-10 MED ORDER — DOXYCYCLINE MONOHYDRATE 100 MG PO TABS: ORAL_TABLET | ORAL | 0 refills | Status: DC

## 2023-09-10 NOTE — Telephone Encounter (Signed)
Patient called stating Dr. Marlynn Perking was suppose to send an antibiotic in for her but pharmacy states none found please send to [facility]

## 2023-09-10 NOTE — Telephone Encounter (Signed)
Sent in doxycycline 100 mg take one tablet twice daily for 10 days for infection. Will call patient to let her know medication was sent in.

## 2023-09-16 ENCOUNTER — Ambulatory Visit: Payer: BC Managed Care – PPO | Admitting: Internal Medicine

## 2023-09-19 ENCOUNTER — Telehealth: Payer: Self-pay | Admitting: *Deleted

## 2023-09-19 NOTE — Telephone Encounter (Signed)
Patient called to check status of Tezspire. I advised approval just came in this am and will sign her up for copay card and submit to [facility] for same. Instructed on delivery, storage, dosing and initial inj in clinic with appt. She is due for her Dupixent today and per Dr Marlynn Perking advised her to take same and then start Tezspire when she receives Rx

## 2023-09-19 NOTE — Telephone Encounter (Signed)
-----   Message from Ferol Luz sent at 09/09/2023  4:17 PM EST ----- Patient has had 2 exacerbations on Dupixent can we look at switching her to tezpire?  Thanks!

## 2023-09-29 ENCOUNTER — Ambulatory Visit: Payer: BC Managed Care – PPO | Admitting: Internal Medicine

## 2023-10-01 ENCOUNTER — Ambulatory Visit: Payer: BC Managed Care – PPO

## 2023-10-02 ENCOUNTER — Ambulatory Visit: Payer: BC Managed Care – PPO

## 2023-10-02 DIAGNOSIS — J4550 Severe persistent asthma, uncomplicated: Secondary | ICD-10-CM | POA: Diagnosis not present

## 2023-10-02 MED ORDER — TEZEPELUMAB-EKKO 210 MG/1.91ML ~~LOC~~ SOSY
210.0000 mg | PREFILLED_SYRINGE | Freq: Once | SUBCUTANEOUS | Status: AC
Start: 2023-10-02 — End: 2023-10-02
  Administered 2023-10-02: 210 mg via SUBCUTANEOUS

## 2023-10-02 NOTE — Progress Notes (Signed)
Immunotherapy   Patient Details  Name: Karla Huber MRN: [id number] Date of Birth: 07/15/1986  10/02/2023  Karla Huber started Tezspire 210/1.91 mL today with no problems.     Frequency: Q 4 weeks Consent signed and patient instructions given. She will continue her injections at home.   Leigh Kaeding J Eulan Heyward 10/02/2023, 2:27 PM

## 2023-10-07 ENCOUNTER — Encounter: Payer: Self-pay | Admitting: Family Medicine

## 2023-10-07 ENCOUNTER — Ambulatory Visit (INDEPENDENT_AMBULATORY_CARE_PROVIDER_SITE_OTHER): Payer: BC Managed Care – PPO | Admitting: Family Medicine

## 2023-10-07 VITALS — BP 108/72 | HR 89 | Ht 62.0 in | Wt 167.4 lb

## 2023-10-07 DIAGNOSIS — M9902 Segmental and somatic dysfunction of thoracic region: Secondary | ICD-10-CM

## 2023-10-07 DIAGNOSIS — M9903 Segmental and somatic dysfunction of lumbar region: Secondary | ICD-10-CM | POA: Diagnosis not present

## 2023-10-07 DIAGNOSIS — M9905 Segmental and somatic dysfunction of pelvic region: Secondary | ICD-10-CM

## 2023-10-07 DIAGNOSIS — M9909 Segmental and somatic dysfunction of abdomen and other regions: Secondary | ICD-10-CM

## 2023-10-07 DIAGNOSIS — M545 Low back pain, unspecified: Secondary | ICD-10-CM

## 2023-10-07 DIAGNOSIS — M9901 Segmental and somatic dysfunction of cervical region: Secondary | ICD-10-CM

## 2023-10-07 DIAGNOSIS — M9904 Segmental and somatic dysfunction of sacral region: Secondary | ICD-10-CM | POA: Diagnosis not present

## 2023-10-07 DIAGNOSIS — M9908 Segmental and somatic dysfunction of rib cage: Secondary | ICD-10-CM

## 2023-10-07 DIAGNOSIS — M9900 Segmental and somatic dysfunction of head region: Secondary | ICD-10-CM

## 2023-10-07 NOTE — Progress Notes (Signed)
BP 108/72   Pulse 89   Ht 5\' 2"  (1.575 m)   Wt 167 lb 6.4 oz (75.9 kg)   SpO2 98%   BMI 30.62 kg/m    Subjective:    Patient ID: Karla Huber, female    DOB: [birth year], 37 y.o.   MRN: [id number]  HPI: Karla Huber is a 37 y.o. female  Chief Complaint  Patient presents with   Low back pain    OMM.    Chaley presents today for evaluation and possible treatment with OMT for her low back pain today. She notes that her low back has not been doing well. Her sacrum is angry again. She felt better for a couple of days last time and then the pain came back. She does note that she was sleeping in a funny position and it acted up. Sitting makes it worse. Moving around makes it better, as does OMT. She notes that it feels stuck. Pain does not radiate. No other concerns or complaints at this time.   Relevant past medical, surgical, family and social history reviewed and updated as indicated. Interim medical history since our last visit reviewed. Allergies and medications reviewed and updated.  Review of Systems  Constitutional: Negative.   Respiratory: Negative.    Cardiovascular: Negative.   Musculoskeletal:  Positive for back pain and myalgias. Negative for arthralgias, gait problem, joint swelling, neck pain and neck stiffness.  Skin: Negative.   Psychiatric/Behavioral: Negative.      Per HPI unless specifically indicated above     Objective:    BP 108/72   Pulse 89   Ht 5\' 2"  (1.575 m)   Wt 167 lb 6.4 oz (75.9 kg)   SpO2 98%   BMI 30.62 kg/m   Wt Readings from Last 3 Encounters:  10/07/23 167 lb 6.4 oz (75.9 kg)  09/09/23 168 lb 12.8 oz (76.6 kg)  09/02/23 166 lb (75.3 kg)    Physical Exam Vitals and nursing note reviewed.  Constitutional:      General: She is not in acute distress.    Appearance: Normal appearance. She is not ill-appearing.  HENT:     Head: Normocephalic and atraumatic.     Right Ear: External ear normal.     Left Ear: External ear  normal.     Nose: Nose normal.     Mouth/Throat:     Mouth: Mucous membranes are moist.     Pharynx: Oropharynx is clear.  Eyes:     Extraocular Movements: Extraocular movements intact.     Conjunctiva/sclera: Conjunctivae normal.     Pupils: Pupils are equal, round, and reactive to light.  Neck:     Vascular: No carotid bruit.  Cardiovascular:     Rate and Rhythm: Normal rate.     Pulses: Normal pulses.  Pulmonary:     Effort: Pulmonary effort is normal. No respiratory distress.  Abdominal:     General: Abdomen is flat. There is no distension.     Palpations: Abdomen is soft. There is no mass.     Tenderness: There is no abdominal tenderness. There is no right CVA tenderness, left CVA tenderness, guarding or rebound.     Hernia: No hernia is present.  Musculoskeletal:     Cervical back: No muscular tenderness.  Lymphadenopathy:     Cervical: No cervical adenopathy.  Skin:    General: Skin is warm and dry.     Capillary Refill: Capillary refill takes less than 2 seconds.  Coloration: Skin is not jaundiced or pale.     Findings: No bruising, erythema, lesion or rash.  Neurological:     General: No focal deficit present.     Mental Status: She is alert. Mental status is at baseline.  Psychiatric:        Mood and Affect: Mood normal.        Behavior: Behavior normal.        Thought Content: Thought content normal.        Judgment: Judgment normal.    Musculoskeletal:  Exam found Decreased ROM, Tissue texture changes, Tenderness to palpation, and Asymmetry of patient's  head, neck, thorax, ribs, lumbar, pelvis, sacrum, and abdomen Osteopathic Structural Exam:   Head: OAESSL, SBS SRL, hypertonic suboccipital muscles  Neck: C4ESRRL, C5ESRRL  Thorax: T3-5 SRRL  Ribs: Ribs 6-8 locked up on the R, Rib 5 locked up on the L  Lumbar: QL hypertonic on the R, L3-5SLRR  Pelvis: Posterior R innominate  Sacrum: R on L torsion  Abdomen: diaphragm hypertonic on the R  Results for  orders placed or performed in visit on 07/30/23  B12  Result Value Ref Range   Vitamin B-12 684 232 - 1,245 pg/mL  CBC with Differential/Platelet  Result Value Ref Range   WBC 8.6 3.4 - 10.8 x10E3/uL   RBC 4.45 3.77 - 5.28 x10E6/uL   Hemoglobin 14.5 11.1 - 15.9 g/dL   Hematocrit 16.1 09.6 - 46.6 %   MCV 102 (H) 79 - 97 fL   MCH 32.6 26.6 - 33.0 pg   MCHC 32.1 31.5 - 35.7 g/dL   RDW 04.5 40.9 - 81.1 %   Platelets 409 150 - 450 x10E3/uL   Neutrophils 67 Not Estab. %   Lymphs 24 Not Estab. %   Monocytes 7 Not Estab. %   Eos 1 Not Estab. %   Basos 1 Not Estab. %   Neutrophils Absolute 5.8 1.4 - 7.0 x10E3/uL   Lymphocytes Absolute 2.1 0.7 - 3.1 x10E3/uL   Monocytes Absolute 0.6 0.1 - 0.9 x10E3/uL   EOS (ABSOLUTE) 0.1 0.0 - 0.4 x10E3/uL   Basophils Absolute 0.0 0.0 - 0.2 x10E3/uL   Immature Granulocytes 0 Not Estab. %   Immature Grans (Abs) 0.0 0.0 - 0.1 x10E3/uL  Comprehensive metabolic panel  Result Value Ref Range   Glucose 89 70 - 99 mg/dL   BUN 5 (L) 6 - 20 mg/dL   Creatinine, Ser 9.14 0.57 - 1.00 mg/dL   eGFR 782 >95 AO/ZHY/8.65   BUN/Creatinine Ratio 8 (L) 9 - 23   Sodium 140 134 - 144 mmol/L   Potassium 4.5 3.5 - 5.2 mmol/L   Chloride 103 96 - 106 mmol/L   CO2 24 20 - 29 mmol/L   Calcium 10.2 8.7 - 10.2 mg/dL   Total Protein 7.3 6.0 - 8.5 g/dL   Albumin 4.7 3.9 - 4.9 g/dL   Globulin, Total 2.6 1.5 - 4.5 g/dL   Bilirubin Total 0.3 0.0 - 1.2 mg/dL   Alkaline Phosphatase 71 44 - 121 IU/L   AST 22 0 - 40 IU/L   ALT 23 0 - 32 IU/L  Iron Binding Cap (TIBC)([facility])  Result Value Ref Range   Total Iron Binding Capacity 314 250 - 450 ug/dL   UIBC 784 696 - 295 ug/dL   Iron 86 27 - 284 ug/dL   Iron Saturation 27 15 - 55 %  Ferritin  Result Value Ref Range   Ferritin 215 (H) 15 - 150 ng/mL  Hgb A1c w/o eAG  Result Value Ref Range   Hgb A1c MFr Bld 5.0 4.8 - 5.6 %      Assessment & Plan:   Problem List Items Addressed This Visit   None Visit Diagnoses      Acute bilateral low back pain without sciatica    -  Primary   In acute exacerbation. She does have somatic dysfunction that is contributing ot her symptoms. Treated today with good results as below. Call with any concerns.   Cervical segment dysfunction       Thoracic segment dysfunction       Somatic dysfunction of lumbar region       Somatic dysfunction of sacral region       Somatic dysfunction of pelvis region       Head region somatic dysfunction       Rib cage region somatic dysfunction       Segmental dysfunction of abdomen          After verbal consent was obtained, patient was treated today with osteopathic manipulative medicine to the regions of the head, neck, thorax, ribs, lumbar, pelvis, sacrum, and abdomen using the techniques of cranial, myofascial release, counterstrain, muscle energy, HVLA, and soft tissue. Areas of compensation relating to her primary pain source also treated. Patient tolerated the procedure well with good objective and good subjective improvement in symptoms. .She left the room in good condition. She was advised to stay well hydrated and that she may have some soreness following the procedure. If not improving or worsening, she will call and come in. Home exercise program of stretches for SI joints discussed and demonstrated today. Patient will do these stretches BID to before the point of pain, and will return for reevaluation  on a PRN basis.   Follow up plan: Return if symptoms worsen or fail to improve.

## 2023-11-05 DIAGNOSIS — Z01419 Encounter for gynecological examination (general) (routine) without abnormal findings: Secondary | ICD-10-CM | POA: Diagnosis not present

## 2023-11-05 DIAGNOSIS — Z1331 Encounter for screening for depression: Secondary | ICD-10-CM | POA: Diagnosis not present

## 2023-12-01 DIAGNOSIS — H40003 Preglaucoma, unspecified, bilateral: Secondary | ICD-10-CM | POA: Diagnosis not present

## 2023-12-03 ENCOUNTER — Encounter: Payer: Self-pay | Admitting: Family Medicine

## 2023-12-03 ENCOUNTER — Ambulatory Visit: Payer: BC Managed Care – PPO | Admitting: Family Medicine

## 2023-12-03 VITALS — BP 108/68 | HR 96 | Ht 62.0 in | Wt 165.4 lb

## 2023-12-03 DIAGNOSIS — M9905 Segmental and somatic dysfunction of pelvic region: Secondary | ICD-10-CM

## 2023-12-03 DIAGNOSIS — M9902 Segmental and somatic dysfunction of thoracic region: Secondary | ICD-10-CM | POA: Diagnosis not present

## 2023-12-03 DIAGNOSIS — M9906 Segmental and somatic dysfunction of lower extremity: Secondary | ICD-10-CM | POA: Diagnosis not present

## 2023-12-03 DIAGNOSIS — M9904 Segmental and somatic dysfunction of sacral region: Secondary | ICD-10-CM | POA: Diagnosis not present

## 2023-12-03 DIAGNOSIS — M9903 Segmental and somatic dysfunction of lumbar region: Secondary | ICD-10-CM

## 2023-12-03 DIAGNOSIS — M9908 Segmental and somatic dysfunction of rib cage: Secondary | ICD-10-CM | POA: Diagnosis not present

## 2023-12-03 DIAGNOSIS — M9909 Segmental and somatic dysfunction of abdomen and other regions: Secondary | ICD-10-CM

## 2023-12-03 DIAGNOSIS — M25562 Pain in left knee: Secondary | ICD-10-CM | POA: Diagnosis not present

## 2023-12-03 NOTE — Progress Notes (Signed)
 BP 108/68   Pulse 96   Ht 5' 2 (1.575 m)   Wt 165 lb 6.4 oz (75 kg)   SpO2 98%   BMI 30.25 kg/m    Subjective:    Patient ID: Karla Huber, female    DOB: [birth year], 38 y.o.   MRN: [id number]  HPI: Germani Gavilanes is a 38 y.o. female  Chief Complaint  Patient presents with   Knee Pain        Gianna presents today for evaluation and possible treatment with OMT for her knee pain. She notes that she got her knee caught about 2 weeks ago going down a waterslide and has been having pain in her knee since then. She has been trying to stretch it out, but has had a lot of stress. She notes that her knee is hurting on the lateral side- it's radiating into her thigh and into her calf. Better with rest, worse with certain movements. Pain is aching and sore in nature. She notes that she's otherwise doing well with no other concerns or complaints at this time.   Relevant past medical, surgical, family and social history reviewed and updated as indicated. Interim medical history since our last visit reviewed. Allergies and medications reviewed and updated.  Review of Systems  Constitutional: Negative.   Respiratory: Negative.    Cardiovascular: Negative.   Gastrointestinal: Negative.   Musculoskeletal:  Positive for myalgias. Negative for arthralgias, back pain, gait problem, joint swelling, neck pain and neck stiffness.  Skin: Negative.   Neurological: Negative.   Psychiatric/Behavioral: Negative.      Per HPI unless specifically indicated above     Objective:    BP 108/68   Pulse 96   Ht 5' 2 (1.575 m)   Wt 165 lb 6.4 oz (75 kg)   SpO2 98%   BMI 30.25 kg/m   Wt Readings from Last 3 Encounters:  12/03/23 165 lb 6.4 oz (75 kg)  10/07/23 167 lb 6.4 oz (75.9 kg)  09/09/23 168 lb 12.8 oz (76.6 kg)    Physical Exam Vitals and nursing note reviewed.  Constitutional:      General: She is not in acute distress.    Appearance: Normal appearance. She is not ill-appearing.   HENT:     Head: Normocephalic and atraumatic.     Right Ear: External ear normal.     Left Ear: External ear normal.     Nose: Nose normal.     Mouth/Throat:     Mouth: Mucous membranes are moist.     Pharynx: Oropharynx is clear.  Eyes:     Extraocular Movements: Extraocular movements intact.     Conjunctiva/sclera: Conjunctivae normal.     Pupils: Pupils are equal, round, and reactive to light.  Neck:     Vascular: No carotid bruit.  Cardiovascular:     Rate and Rhythm: Normal rate.     Pulses: Normal pulses.  Pulmonary:     Effort: Pulmonary effort is normal. No respiratory distress.  Abdominal:     General: Abdomen is flat. There is no distension.     Palpations: Abdomen is soft. There is no mass.     Tenderness: There is no abdominal tenderness. There is no right CVA tenderness, left CVA tenderness, guarding or rebound.     Hernia: No hernia is present.  Musculoskeletal:     Cervical back: No muscular tenderness.  Lymphadenopathy:     Cervical: No cervical adenopathy.  Skin:    General: Skin  is warm and dry.     Capillary Refill: Capillary refill takes less than 2 seconds.     Coloration: Skin is not jaundiced or pale.     Findings: No bruising, erythema, lesion or rash.  Neurological:     General: No focal deficit present.     Mental Status: She is alert. Mental status is at baseline.  Psychiatric:        Mood and Affect: Mood normal.        Behavior: Behavior normal.        Thought Content: Thought content normal.        Judgment: Judgment normal.    Musculoskeletal:  Exam found Decreased ROM, Tissue texture changes, Tenderness to palpation, and Asymmetry of patient's  thorax, ribs, lumbar, pelvis, sacrum, lower extremity, and abdomen Osteopathic Structural Exam:   Thorax: T3-5SLRR  Ribs: Ribs 5-8 locked up on the L, Ribs 4-6 locked up on the R  Lumbar: QL hypertonic on the L  Pelvis: Posterior L innominate  Sacrum: L on L torsion  Lower Extremity: Posterior  L fibular head, IT band on the L hypertonic  Abdomen: diaphragm hypertonic on the L  Results for orders placed or performed in visit on 07/30/23  B12   Collection Time: 07/30/23 10:53 AM  Result Value Ref Range   Vitamin B-12 684 232 - 1,245 pg/mL  CBC with Differential/Platelet   Collection Time: 07/30/23 10:53 AM  Result Value Ref Range   WBC 8.6 3.4 - 10.8 x10E3/uL   RBC 4.45 3.77 - 5.28 x10E6/uL   Hemoglobin 14.5 11.1 - 15.9 g/dL   Hematocrit 54.7 65.9 - 46.6 %   MCV 102 (H) 79 - 97 fL   MCH 32.6 26.6 - 33.0 pg   MCHC 32.1 31.5 - 35.7 g/dL   RDW 88.1 88.2 - 84.5 %   Platelets 409 150 - 450 x10E3/uL   Neutrophils 67 Not Estab. %   Lymphs 24 Not Estab. %   Monocytes 7 Not Estab. %   Eos 1 Not Estab. %   Basos 1 Not Estab. %   Neutrophils Absolute 5.8 1.4 - 7.0 x10E3/uL   Lymphocytes Absolute 2.1 0.7 - 3.1 x10E3/uL   Monocytes Absolute 0.6 0.1 - 0.9 x10E3/uL   EOS (ABSOLUTE) 0.1 0.0 - 0.4 x10E3/uL   Basophils Absolute 0.0 0.0 - 0.2 x10E3/uL   Immature Granulocytes 0 Not Estab. %   Immature Grans (Abs) 0.0 0.0 - 0.1 x10E3/uL  Comprehensive metabolic panel   Collection Time: 07/30/23 10:53 AM  Result Value Ref Range   Glucose 89 70 - 99 mg/dL   BUN 5 (L) 6 - 20 mg/dL   Creatinine, Ser 9.33 0.57 - 1.00 mg/dL   eGFR 884 >40 fO/fpw/8.26   BUN/Creatinine Ratio 8 (L) 9 - 23   Sodium 140 134 - 144 mmol/L   Potassium 4.5 3.5 - 5.2 mmol/L   Chloride 103 96 - 106 mmol/L   CO2 24 20 - 29 mmol/L   Calcium 10.2 8.7 - 10.2 mg/dL   Total Protein 7.3 6.0 - 8.5 g/dL   Albumin 4.7 3.9 - 4.9 g/dL   Globulin, Total 2.6 1.5 - 4.5 g/dL   Bilirubin Total 0.3 0.0 - 1.2 mg/dL   Alkaline Phosphatase 71 44 - 121 IU/L   AST 22 0 - 40 IU/L   ALT 23 0 - 32 IU/L  Iron  Binding Cap (TIBC)([facility])   Collection Time: 07/30/23 10:53 AM  Result Value Ref Range   Total Iron   Binding Capacity 314 250 - 450 ug/dL   UIBC 771 868 - 574 ug/dL   Iron  86 27 - 159 ug/dL   Iron  Saturation 27 15 -  55 %  Ferritin   Collection Time: 07/30/23 10:53 AM  Result Value Ref Range   Ferritin 215 (H) 15 - 150 ng/mL  Hgb A1c w/o eAG   Collection Time: 07/30/23 10:53 AM  Result Value Ref Range   Hgb A1c MFr Bld 5.0 4.8 - 5.6 %      Assessment & Plan:   Problem List Items Addressed This Visit   None Visit Diagnoses       Acute pain of left knee    -  Primary   In exacerbation. She does have somatic dysfunction that is contributing to her symptoms. Treated today with good results as below. Call with any concerns.     Thoracic segment dysfunction         Somatic dysfunction of lumbar region         Somatic dysfunction of sacral region         Somatic dysfunction of pelvis region         Rib cage region somatic dysfunction         Segmental dysfunction of abdomen         Somatic dysfunction of lower extremities          After verbal consent was obtained, patient was treated today with osteopathic manipulative medicine to the regions of the thorax, ribs, lumbar, pelvis, sacrum, abdomen, and lower extremity using the techniques of cranial, myofascial release, counterstrain, muscle energy, HVLA, and soft tissue. Areas of compensation relating to her primary pain source also treated. Patient tolerated the procedure well with good objective and good subjective improvement in symptoms. She left the room in good condition. She was advised to stay well hydrated and that she may have some soreness following the procedure. If not improving or worsening, he will call and come in. She will return for reevaluation  on a PRN basis.   Follow up plan: Return if symptoms worsen or fail to improve.

## 2023-12-05 ENCOUNTER — Other Ambulatory Visit: Payer: Self-pay | Admitting: Family Medicine

## 2023-12-05 NOTE — Telephone Encounter (Signed)
 Requested medications are due for refill today.  yes  Requested medications are on the active medications list.  yes  Last refill. 01/28/2023 #30 3 rf  Future visit scheduled.   yes  Notes to clinic.  Refill/refusal not delegated.    Requested Prescriptions  Pending Prescriptions Disp Refills   methocarbamol  (ROBAXIN ) 500 MG tablet [Pharmacy Med Name: METHOCARBAMOL  500 MG TAB] 30 tablet 3    Sig: TAKE 1 TABLET BY MOUTH EVERY NIGHT AT BEDTIME AS NEEDED FOR MUSCLE SPASMS     Not Delegated - Analgesics:  Muscle Relaxants Failed - 12/05/2023  3:34 PM      Failed - This refill cannot be delegated      Passed - Valid encounter within last 6 months    Recent Outpatient Visits           1 month ago Acute bilateral low back pain without sciatica   [place] [facility] [place], Megan P, DO   3 months ago Acute bilateral low back pain without sciatica   [place] [facility] [place], Megan P, DO   4 months ago Mild intermittent asthma without complication   [place] [facility] [place], Megan P, DO   5 months ago Mild intermittent asthma without complication   [facility] [place], Megan P, DO   10 months ago Routine general medical examination at a health care facility   [facility] [place] Duwaine SQUIBB, DO       Future Appointments             In 5 days Lorin Norris, MD [facility]   In 1 month [place], Duwaine SQUIBB, DO [place] [facility]

## 2023-12-10 ENCOUNTER — Other Ambulatory Visit: Payer: Self-pay

## 2023-12-10 ENCOUNTER — Ambulatory Visit (INDEPENDENT_AMBULATORY_CARE_PROVIDER_SITE_OTHER): Payer: BC Managed Care – PPO | Admitting: Internal Medicine

## 2023-12-10 VITALS — BP 116/68 | HR 89 | Temp 97.7°F | Resp 18 | Wt 167.5 lb

## 2023-12-10 DIAGNOSIS — J029 Acute pharyngitis, unspecified: Secondary | ICD-10-CM | POA: Diagnosis not present

## 2023-12-10 DIAGNOSIS — K219 Gastro-esophageal reflux disease without esophagitis: Secondary | ICD-10-CM | POA: Diagnosis not present

## 2023-12-10 DIAGNOSIS — J3089 Other allergic rhinitis: Secondary | ICD-10-CM | POA: Diagnosis not present

## 2023-12-10 DIAGNOSIS — J4550 Severe persistent asthma, uncomplicated: Secondary | ICD-10-CM | POA: Diagnosis not present

## 2023-12-10 MED ORDER — BUDESONIDE-FORMOTEROL FUMARATE 160-4.5 MCG/ACT IN AERO: 2.0000 | INHALATION_SPRAY | Freq: Two times a day (BID) | RESPIRATORY_TRACT | 5 refills | Status: DC

## 2023-12-10 MED ORDER — MONTELUKAST SODIUM 10 MG PO TABS: ORAL_TABLET | ORAL | 3 refills | Status: DC

## 2023-12-10 MED ORDER — OMEPRAZOLE MAGNESIUM 20 MG PO TBEC: 20.0000 mg | DELAYED_RELEASE_TABLET | Freq: Two times a day (BID) | ORAL | 5 refills | Status: DC

## 2023-12-10 NOTE — Progress Notes (Signed)
FOLLOW UP Date of Service/Encounter:  12/10/23  Subjective:  Karla Huber (DOB: 11/02/1985) is a 38 y.o. female who [facility]23 in re-evaluation of the following: Persistent asthma, allergic rhinitis, reflux History obtained from: chart review and patient.  For Review, LV was on 09/02/23  with Thermon Leyland, FNP seen for routine follow-up. See below for summary of history and diagnostics.   Therapeutic plans/changes recommended: She was treated with doxycyline for acute sinusitis and switched to Tezspire.   ----------------------------------------------------- Pertinent History/Diagnostics:  Asthma/AERD : Dx at 38 years old, 2 OCS 2024, triggered by URI, seasonal changes, ibuprofen, peanut butter Rx: Breztri, DuoNebs, montelukast, Dupixent Previous Rx: Symbicort 160, Airsupra -Mild restriction spirometry (11//24): ratio 83, 2.36 L, 77% FEV1 (pre),  Allergic Rhinitis:  Year-round, triggers with weather changes, cats; hyposmia with nasal symptoms Rx: Carbinoxamine, Flonase, ipratropium, montelukast  - SPT environmental panel  not done yet  Other: GERD  --------------------------------------------------- Today presents for follow-up. Discussed the use of AI scribe software for clinical note transcription with the patient, who gave verbal consent to proceed.  History of Present Illness   Karla Huber is a 38 year old female with asthma who presents with a persistent sore throat and postnasal drip.  She has a persistent sore throat that has not followed the typical progression of a viral infection. The soreness is located down the sides and middle of her throat, with no significant change in severity throughout the day, although it feels worse when dry in the morning. No voice changes or hoarseness are present, and she can eat and drink without difficulty. She suspects postnasal drip might be contributing to her symptoms, although she does not  feel congested.  She experiences postnasal drip, which she believes may be contributing to her sore throat. She uses Flonase, ipratropium, and Astelin for allergy management, and sometimes tastes the medication when using it. No significant congestion is reported. She is taking carbinoxamine twice daily.  Otherwise feels like rhinitis is well controlled.   Her asthma symptoms have improved significantly since starting Tezspire two months ago, with no side effects. She has not needed to use her albuterol inhaler except once recently due to slight chest tightness. She continues to use [employer].  She takes omeprazole 20 mg in the mornings for reflux. She has been traveling frequently between here and [place] and has been around many people, which she initially thought might have contributed to her symptoms. No reflux symptoms are reported.         All medications reviewed by clinical staff and updated in chart. No new pertinent medical or surgical history except as noted in HPI.  ROS: All others negative except as noted per HPI.   Objective:  BP 116/68   Pulse 89   Temp 97.7 F (36.5 C) (Temporal)   Resp 18   Wt 167 lb 8 oz (76 kg)   SpO2 99%   BMI 30.64 kg/m  Body mass index is 30.64 kg/m. Physical Exam: General Appearance:  Alert, cooperative, no distress, appears stated age  Head:  Normocephalic, without obvious abnormality, atraumatic  Eyes:  Conjunctiva clear, EOM's intact  Ears EACs normal bilaterally  Nose: Nares normal, normal mucosa, no visible anterior polyps, and septum midline  Throat: Lips, tongue normal; teeth and gums normal, + cobblestoning,  Neck: Supple, symmetrical  Lungs:   clear to auscultation bilaterally, Respirations unlabored, no coughing  Heart:  regular rate and rhythm and no murmur, Appears  well perfused  Extremities: No edema  Skin: Skin color, texture, turgor normal and no rashes or lesions on visualized portions of skin  Neurologic: No gross  deficits   Labs:  Lab Orders  No laboratory test(s) ordered today      Assessment/Plan   Asthma, severe persistent, well controlled  Stop [employer] Symbicort 2 puffs twice daily  Continue montelukast 10 mg once a day to prevent cough or wheeze Continue Combivent  2 puffs every 4 hours as needed for cough or wheeze OR Instead use duoneb solution via nebulizer one unit vial every 4 hours as needed for cough or wheeze Continue tezspire injections every 4 weeks   Allergic rhinitis Increase  carbinoxamine 4-8 mg  three times a day  Continue Flonase 2.5 mg once a day for stuffy nose Continue ipratropium 2 sprays each nostril twice a day as needed Consider saline nasal rinses as needed for nasal symptoms. Use this before any medicated nasal sprays for best result Consider updating your environmental allergy testing when you are breathing has returned to baseline  Reflux Continue dietary and lifestyle modifications as listed below Increase omeprazole to 20mg  twice daily for the next week to see if this helps sore throat   Follow up: 6 months   Other:  none    Thank you so much for letting me partake in your care today.  Don't hesitate to reach out if you have any additional concerns!  Ferol Luz, MD  [facility] [place] [place]

## 2023-12-10 NOTE — Patient Instructions (Addendum)
Asthma, severe persistent, well controlled  Stop [employer] Symbicort 2 puffs twice daily  Continue montelukast 10 mg once a day to prevent cough or wheeze Continue Combivent  2 puffs every 4 hours as needed for cough or wheeze OR Instead use duoneb solution via nebulizer one unit vial every 4 hours as needed for cough or wheeze Continue tezspire injections every 4 weeks   Allergic rhinitis Increase  carbinoxamine 4-8 mg  three times a day  Continue Flonase 2.5 mg once a day for stuffy nose Continue ipratropium 2 sprays each nostril twice a day as needed Consider saline nasal rinses as needed for nasal symptoms. Use this before any medicated nasal sprays for best result Consider updating your environmental allergy testing when you are breathing has returned to baseline  Reflux Continue dietary and lifestyle modifications as listed below Increase omeprazole to 20mg  twice daily for the next week to see if this helps sore throat   Follow up: 6 months   Thank you so much for letting me partake in your care today.  Don't hesitate to reach out if you have any additional concerns!  Ferol Luz, MD  [facility] [place] [place]

## 2023-12-11 ENCOUNTER — Encounter: Payer: Self-pay | Admitting: Internal Medicine

## 2023-12-11 ENCOUNTER — Ambulatory Visit (HOSPITAL_BASED_OUTPATIENT_CLINIC_OR_DEPARTMENT_OTHER)
Admission: EM | Admit: 2023-12-11 | Discharge: 2023-12-11 | Disposition: A | Discharge status: Home / Self Care | Payer: BC Managed Care – PPO

## 2023-12-11 ENCOUNTER — Encounter (HOSPITAL_BASED_OUTPATIENT_CLINIC_OR_DEPARTMENT_OTHER): Payer: Self-pay | Admitting: Emergency Medicine

## 2023-12-11 DIAGNOSIS — J040 Acute laryngitis: Secondary | ICD-10-CM

## 2023-12-11 DIAGNOSIS — J069 Acute upper respiratory infection, unspecified: Secondary | ICD-10-CM

## 2023-12-11 LAB — POCT RAPID STREP A (OFFICE): Rapid Strep A Screen: NEGATIVE

## 2023-12-11 MED ORDER — PREDNISONE 20 MG PO TABS
20.0000 mg | ORAL_TABLET | Freq: Every day | ORAL | 0 refills | Status: AC
Start: 2023-12-11 — End: 2023-12-16

## 2023-12-11 NOTE — ED Triage Notes (Signed)
Pt c/o sore throat x 1 week, voice hoarseness, ear aches. Has tested for flu and covid at home and both were negative.

## 2023-12-11 NOTE — Discharge Instructions (Addendum)
Continue with azelastine and fluticasone as previously prescribed.  Use Symbicort as provided.  Educated about laryngitis and the need to not talk at all.  Specifically she needs to right meds only.  Hot and cold fluids for comfort measures.  Prednisone, 20 mg, 1 daily for 5 days.  Get plenty of fluids and rest.  Work excuse provided.  Follow-up if symptoms do not improve, worsen or new symptoms occur.

## 2023-12-11 NOTE — ED Provider Notes (Addendum)
[facility]    CSN: [id number] Arrival date & time: 12/11/23  1530      History   Chief Complaint No chief complaint on file.   HPI Karla Huber is a 38 y.o. female.   Pt c/o sore throat x 1 week, voice hoarseness, ear aches. Has tested for flu and covid at home and both were negative.  She reports using azelastine and fluticasone nasal sprays.  She is also used some ipratropium inhaler.  She continues with the hoarse voice.  She is a Runner, broadcasting/film/video and teaches preschool.       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   Menometrorrhagia 12/08/2017   Postpartum care following cesarean delivery (4/30) 02/25/2015   Postpartum care following repeat cesarean delivery (1/16) 11/13/2016   Preeclampsia    Traumatic injury during pregnancy in third trimester 02/09/2015    Patient Active Problem List   Diagnosis Date Noted   Shortness of breath 09/02/2023   Not well controlled severe persistent asthma 09/02/2023   Gastroesophageal reflux disease 09/02/2023   Anemia 06/20/2023   Abnormal uterine bleeding 06/20/2023   History of gestational diabetes mellitus 06/20/2023   Migraine 11/29/2020   Rosacea 09/17/2018   Asthma 06/30/2018   Other allergic rhinitis 06/26/2018   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   Menometrorrhagia 12/08/2017   Iron deficiency anemia of pregnancy 11/13/2016   Acute blood loss anemia 11/13/2016   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   Tenosynovitis of foot 04/01/2014   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    Past Surgical History:  Procedure Laterality Date   ADENOIDECTOMY     CESAREAN SECTION N/A 02/25/2015   Procedure: CESAREAN SECTION;  Surgeon:  Shea Evans, MD;  Location: [facility];  Service: Obstetrics;  Laterality: N/A;   CESAREAN SECTION N/A 11/12/2016   Procedure: Repeat CESAREAN SECTION;  Surgeon: Olivia Mackie, MD;  Location: [facility];  Service: Obstetrics;  Laterality: N/A;   FOOT SURGERY     KNEE SURGERY     TONSILLECTOMY      OB History     Gravida  3   Para  2   Term  2   Preterm      AB  1   Living  2      SAB  1   IAB  0   Ectopic  0   Multiple  0   Live Births  2            Home Medications    Prior to Admission medications   Medication Sig Start Date End Date Taking? Authorizing Provider  BREZTRI AEROSPHERE 160-9-4.8 MCG/ACT AERO Inhale 1-2 puffs into the lungs 2 (two) times daily at 10 AM and 5 PM. 09/02/23  Yes Ambs, Norvel Richards, FNP  budesonide-formoterol (SYMBICORT) 160-4.5 MCG/ACT inhaler Inhale 2 puffs into the lungs 2 (two) times daily. 12/10/23  Yes Ferol Luz, MD  Carbinoxamine Maleate 4 MG TABS Take 1 tablet (4 mg total) by mouth in the morning, at noon, and at bedtime. 06/23/23  Yes Ferol Luz, MD  doxycycline (MONODOX) 100 MG capsule Take 100 mg by mouth 2 (two) times daily.  09/10/23  Yes [provider]  montelukast (SINGULAIR) 10 MG tablet TAKE 1 TABLET BY MOUTH EACH NIGHT AT BEDTIME 12/10/23  Yes Ferol Luz, MD  predniSONE (DELTASONE) 20 MG tablet Take 1 tablet (20 mg total) by mouth daily with breakfast for 5 days. 12/11/23 12/16/23 Yes Prescilla Sours, FNP  TEZSPIRE 210 MG/1. SOAJ Inject into the skin. 09/29/23  Yes [provider]  albuterol (PROVENTIL) (2.5 MG/3ML) 0.083% nebulizer solution Take 3 mLs (2.5 mg total) by nebulization every 4 (four) hours as needed for wheezing or shortness of breath. 09/02/23   Hetty Blend, FNP  azelastine (ASTELIN) 0.1 % nasal spray Place 2 sprays into both nostrils 2 (two) times daily. 06/19/23   [provider]  fluticasone (FLONASE) 50 MCG/ACT nasal spray Place 2 sprays into both nostrils  daily. 06/23/23   Ferol Luz, MD  ipratropium (ATROVENT) 0.06 % nasal spray Place 2 sprays into both nostrils 2 (two) times daily. 12/19/22   Nyoka Cowden, MD  Ipratropium-Albuterol (COMBIVENT RESPIMAT) 20-100 MCG/ACT AERS respimat Inhale 1 puff into the lungs every 4 (four) hours as needed for wheezing. 09/08/23   Ferol Luz, MD  ipratropium-albuterol (DUONEB) 0.5-2.5 (3) MG/3ML SOLN Take 3 mLs by nebulization every 6 (six) hours as needed. 06/19/23   [provider]  methocarbamol (ROBAXIN) 500 MG tablet TAKE 1 TABLET BY MOUTH EVERY NIGHT AT BEDTIME AS NEEDED FOR MUSCLE SPASMS 12/05/23   Johnson, Megan P, DO  omeprazole (PRILOSEC OTC) 20 MG tablet Take 1 tablet (20 mg total) by mouth in the morning and at bedtime. 12/10/23   Ferol Luz, MD  triamcinolone ointment (KENALOG) 0.1 % Apply 1 Application topically 2 (two) times daily. 05/13/23   [provider]    Family History Family History  Problem Relation Age of Onset   Heart disease Mother    Mitral valve prolapse Father    Gout Father    Asthma Sister    Hypertension Maternal Grandmother    Cancer Cousin        AML    Social History Social History   Tobacco Use   Smoking status: Never   Smokeless tobacco: Never  Vaping Use   Vaping status: Never Used  Substance Use Topics   Alcohol use: No   Drug use: No     Allergies   Hydrocodone-acetaminophen, Oxycodone-acetaminophen, and Hydrocodone   Review of Systems Review of Systems  Constitutional:  Negative for chills and fever.  HENT:  Positive for sore throat and voice change. Negative for ear pain.   Eyes:  Negative for pain and visual disturbance.  Respiratory:  Negative for cough and shortness of breath.   Cardiovascular:  Negative for chest pain and palpitations.  Gastrointestinal:  Negative for abdominal pain, constipation, diarrhea, nausea and vomiting.  Genitourinary:  Negative for dysuria and hematuria.  Musculoskeletal:  Negative  for arthralgias and back pain.  Skin:  Negative for color change and rash.  Neurological:  Negative for seizures and syncope.  All other systems reviewed and are negative.    Physical Exam Triage Vital Signs ED Triage Vitals  Encounter Vitals Group     BP 12/11/23 1537 123/79     Systolic BP Percentile --      Diastolic BP Percentile --      Pulse Rate 12/11/23 1537 99     Resp 12/11/23 1537 18     Temp 12/11/23 1537 98.7 F (37.1 C)     Temp Source 12/11/23 1537 Oral  SpO2 12/11/23 1537 99 %     Weight --      Height --      Head Circumference --      Peak Flow --      Pain Score 12/11/23 1536 6     Pain Loc --      Pain Education --      Exclude from Growth Chart --    No data found.  Updated Vital Signs BP 123/79 (BP Location: Right Arm)   Pulse 99   Temp 98.7 F (37.1 C) (Oral)   Resp 18   LMP 12/01/2023 (Approximate)   SpO2 99%   Visual Acuity Right Eye Distance:   Left Eye Distance:   Bilateral Distance:    Right Eye Near:   Left Eye Near:    Bilateral Near:     Physical Exam Vitals and nursing note reviewed.  Constitutional:      General: She is not in acute distress.    Appearance: She is well-developed. She is not ill-appearing or toxic-appearing.  HENT:     Head: Normocephalic and atraumatic.     Right Ear: Hearing, tympanic membrane, ear canal and external ear normal.     Left Ear: Hearing, tympanic membrane, ear canal and external ear normal.     Nose: Congestion and rhinorrhea present. Rhinorrhea is clear.     Right Sinus: No maxillary sinus tenderness or frontal sinus tenderness.     Left Sinus: No maxillary sinus tenderness or frontal sinus tenderness.     Mouth/Throat:     Lips: Pink.     Mouth: Mucous membranes are moist.     Pharynx: Uvula midline. No oropharyngeal exudate or posterior oropharyngeal erythema.     Tonsils: No tonsillar exudate.     Comments: Voice is very hoarse Eyes:     Conjunctiva/sclera: Conjunctivae normal.      Pupils: Pupils are equal, round, and reactive to light.  Cardiovascular:     Rate and Rhythm: Normal rate and regular rhythm.     Heart sounds: S1 normal and S2 normal. No murmur heard. Pulmonary:     Effort: Pulmonary effort is normal. No respiratory distress.     Breath sounds: Normal breath sounds. No decreased breath sounds, wheezing, rhonchi or rales.  Abdominal:     Palpations: Abdomen is soft.     Tenderness: There is no abdominal tenderness.  Musculoskeletal:        General: No swelling.     Cervical back: Neck supple.  Lymphadenopathy:     Head:     Right side of head: No submental, submandibular, tonsillar, preauricular or posterior auricular adenopathy.     Left side of head: No submental, submandibular, tonsillar, preauricular or posterior auricular adenopathy.     Cervical: No cervical adenopathy.     Right cervical: No superficial cervical adenopathy.    Left cervical: No superficial cervical adenopathy.  Skin:    General: Skin is warm and dry.     Capillary Refill: Capillary refill takes less than 2 seconds.     Findings: No rash.  Neurological:     Mental Status: She is alert and oriented to person, place, and time.  Psychiatric:        Mood and Affect: Mood normal.      UC Treatments / Results  Labs (all labs ordered are listed, but only abnormal results are displayed) Labs Reviewed  POCT RAPID STREP A (OFFICE) - Normal    EKG  Radiology No results found.  Procedures Procedures (including critical care time)  Medications Ordered in UC Medications - No data to display  Initial Impression / Assessment and Plan / UC Course  I have reviewed the triage vital signs and the nursing notes.  Pertinent labs & imaging results that were available during my care of the patient were reviewed by me and considered in my medical decision making (see chart for details).     Flu and COVID were negative.  Rapid strep was negative.  Continue azelastine and  fluticasone nasal spray as previously provided.  Use Symbicort inhaler as previously provided.  Educated about laryngitis.  Encouraged not to talk about holding right lites.  Hot and cold fluids for comfort measures.  Prednisone, 20 mg, 1 daily for 5 days.  Get plenty of fluids and rest.  Work excuse provided.  Follow-up if symptoms do not improve, worsen or new symptoms occur. Final Clinical Impressions(s) / UC Diagnoses   Final diagnoses:  Viral URI  Laryngitis, acute     Discharge Instructions      Continue with azelastine and fluticasone as previously prescribed.  Use Symbicort as provided.  Educated about laryngitis and the need to not talk at all.  Specifically she needs to right meds only.  Hot and cold fluids for comfort measures.  Prednisone, 20 mg, 1 daily for 5 days.  Get plenty of fluids and rest.  Work excuse provided.  Follow-up if symptoms do not improve, worsen or new symptoms occur.     ED Prescriptions     Medication Sig Dispense Auth. Provider   predniSONE (DELTASONE) 20 MG tablet Take 1 tablet (20 mg total) by mouth daily with breakfast for 5 days. 5 tablet Prescilla Sours, FNP      PDMP not reviewed this encounter.   Prescilla Sours, FNP 12/11/23 1601    Prescilla Sours, FNP 12/11/23 [phone]

## 2023-12-14 DIAGNOSIS — S63501A Unspecified sprain of right wrist, initial encounter: Secondary | ICD-10-CM | POA: Diagnosis not present

## 2023-12-22 ENCOUNTER — Encounter: Payer: Self-pay | Admitting: Internal Medicine

## 2023-12-23 ENCOUNTER — Other Ambulatory Visit: Payer: Self-pay | Admitting: Family Medicine

## 2023-12-31 DIAGNOSIS — S63641A Sprain of metacarpophalangeal joint of right thumb, initial encounter: Secondary | ICD-10-CM | POA: Diagnosis not present

## 2024-01-06 DIAGNOSIS — M79644 Pain in right finger(s): Secondary | ICD-10-CM | POA: Diagnosis not present

## 2024-01-12 DIAGNOSIS — S63641A Sprain of metacarpophalangeal joint of right thumb, initial encounter: Secondary | ICD-10-CM | POA: Diagnosis not present

## 2024-01-12 DIAGNOSIS — S60011A Contusion of right thumb without damage to nail, initial encounter: Secondary | ICD-10-CM | POA: Diagnosis not present

## 2024-01-13 ENCOUNTER — Other Ambulatory Visit: Payer: Self-pay | Admitting: Internal Medicine

## 2024-01-28 ENCOUNTER — Encounter: Payer: Self-pay | Admitting: Family Medicine

## 2024-01-28 ENCOUNTER — Telehealth: Payer: Self-pay

## 2024-01-28 ENCOUNTER — Ambulatory Visit (INDEPENDENT_AMBULATORY_CARE_PROVIDER_SITE_OTHER): Payer: Self-pay | Admitting: Family Medicine

## 2024-01-28 VITALS — BP 106/66 | HR 77 | Temp 97.8°F | Wt 170.6 lb

## 2024-01-28 DIAGNOSIS — Z0000 Encounter for general adult medical examination without abnormal findings: Secondary | ICD-10-CM

## 2024-01-28 DIAGNOSIS — Z8639 Personal history of other endocrine, nutritional and metabolic disease: Secondary | ICD-10-CM | POA: Diagnosis not present

## 2024-01-28 DIAGNOSIS — D509 Iron deficiency anemia, unspecified: Secondary | ICD-10-CM

## 2024-01-28 DIAGNOSIS — J4550 Severe persistent asthma, uncomplicated: Secondary | ICD-10-CM

## 2024-01-28 LAB — BAYER DCA HB A1C WAIVED: HB A1C (BAYER DCA - WAIVED): 4.8 % (ref 4.8–5.6)

## 2024-01-28 NOTE — Assessment & Plan Note (Signed)
 Will get her into pulmonology. Referral generated today.

## 2024-01-28 NOTE — Telephone Encounter (Signed)
 Copied from CRM [id number]. Topic: Referral - Question >> Jan 28, 2024  3:09 PM Yolanda T wrote: Reason for CRM: Selena Batten at [facility] stated the referral needed to be sent to Dr Kendrick Fries at [place].

## 2024-01-28 NOTE — Assessment & Plan Note (Signed)
 Rechecking labs today. Await results. Treat as needed.

## 2024-01-28 NOTE — Progress Notes (Signed)
 BP 106/66 (BP Location: Left Arm, Patient Position: Sitting)   Pulse 77   Temp 97.8 F (36.6 C) (Oral)   Wt 170 lb 9.6 oz (77.4 kg)   BMI 31.20 kg/m    Subjective:    Patient ID: Karla Huber, female    DOB: 1986-07-16, 38 y.o.   MRN: [id number]  HPI: Amarachukwu Lakatos is a 38 y.o. female presenting on 01/28/2024 for comprehensive medical examination. Current medical complaints include:  Asthma is not doing great- has been on the tezspire. She is still flaring pretty often and is not doing great. She would like to see pulmonology.   She currently lives with: husband and kids Menopausal Symptoms: no  Depression Screen done today and results listed below:     01/28/2024    9:06 AM 07/30/2023   10:33 AM 01/28/2023    2:15 PM 10/04/2022   10:56 AM 12/28/2021    2:34 PM  Depression screen PHQ 2/9  Decreased Interest 0 0 0 0 0  Down, Depressed, Hopeless 0 0 0 0 0  PHQ - 2 Score 0 0 0 0 0  Altered sleeping 0 0 0 0 0  Tired, decreased energy 1 0 0 2 0  Change in appetite 0 0 0 0 0  Feeling bad or failure about yourself  0 0 0 0 0  Trouble concentrating 1 0 0 0 0  Moving slowly or fidgety/restless 0 0 0 0 0  Suicidal thoughts 0 0 0 0 0  PHQ-9 Score 2 0 0 2 0  Difficult doing work/chores  Not difficult at all Not difficult at all Not difficult at all     Past Medical History:  Past Medical History:  Diagnosis Date   Acute blood loss anemia 11/13/2016   Allergy 2008   seasonal   Asthma    pulmocort daily   GERD (gastroesophageal reflux disease)    Gestational diabetes    diet controlled   Headache    Hx of varicella    Hypertension    pre-eclampsia   Iron deficiency anemia of pregnancy 11/13/2016   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   Menometrorrhagia 12/08/2017   Postpartum care following cesarean delivery (4/30) 02/25/2015   Postpartum care following repeat cesarean delivery (1/16) 11/13/2016   Preeclampsia    Traumatic injury during pregnancy in third trimester  02/09/2015    Surgical History:  Past Surgical History:  Procedure Laterality Date   ADENOIDECTOMY     CESAREAN SECTION N/A 02/25/2015   Procedure: CESAREAN SECTION;  Surgeon: Shea Evans, MD;  Location: [facility];  Service: Obstetrics;  Laterality: N/A;   CESAREAN SECTION N/A 11/12/2016   Procedure: Repeat CESAREAN SECTION;  Surgeon: Olivia Mackie, MD;  Location: [facility];  Service: Obstetrics;  Laterality: N/A;   CHOLECYSTECTOMY  05/08/2020   FOOT SURGERY     FRACTURE SURGERY  05/2014   KNEE SURGERY     TONSILLECTOMY      Medications:  Current Outpatient Medications on File Prior to Visit  Medication Sig   albuterol (PROVENTIL) (2.5 MG/3ML) 0.083% nebulizer solution Take 3 mLs (2.5 mg total) by nebulization every 4 (four) hours as needed for wheezing or shortness of breath.   azelastine (ASTELIN) 0.1 % nasal spray Place 2 sprays into both nostrils 2 (two) times daily.   budesonide-formoterol (SYMBICORT) 160-4.5 MCG/ACT inhaler Inhale 2 puffs into the lungs 2 (two) times daily.   Carbinoxamine Maleate 4 MG TABS TAKE 1 TABLET BY MOUTH 3 TIMES DAILY (MORNING -  NOON - BEDTIME)   fluticasone (FLONASE) 50 MCG/ACT nasal spray Place 2 sprays into both nostrils daily.   ipratropium (ATROVENT) 0.06 % nasal spray Place 2 sprays into both nostrils 2 (two) times daily.   methocarbamol (ROBAXIN) 500 MG tablet TAKE 1 TABLET BY MOUTH EVERY NIGHT AT BEDTIME AS NEEDED FOR MUSCLE SPASMS   montelukast (SINGULAIR) 10 MG tablet TAKE 1 TABLET BY MOUTH EACH NIGHT AT BEDTIME   omeprazole (PRILOSEC OTC) 20 MG tablet Take 1 tablet (20 mg total) by mouth in the morning and at bedtime.   TEZSPIRE 210 MG/1. SOAJ Inject into the skin.   No current facility-administered medications on file prior to visit.    Allergies:  Allergies  Allergen Reactions   Hydrocodone-Acetaminophen Hives   Oxycodone-Acetaminophen Nausea And Vomiting    Projectile vomiting   Hydrocodone Hives    Social History:   Social History   Socioeconomic History   Marital status: Married    Spouse name: Not on file   Number of children: Not on file   Years of education: Not on file   Highest education level: Master's degree (e.g., MA, MS, MEng, MEd, MSW, MBA)  Occupational History   Occupation: umemployed  Tobacco Use   Smoking status: Never   Smokeless tobacco: Never  Vaping Use   Vaping status: Never Used  Substance and Sexual Activity   Alcohol use: No   Drug use: No   Sexual activity: Yes    Birth control/protection: Surgical  Other Topics Concern   Not on file  Social History Narrative   Exercises try 3-5 times a week for 30-56min   Social Drivers of Health   Financial Resource Strain: Low Risk  (01/25/2024)   Overall Financial Resource Strain (CARDIA)    Difficulty of Paying Living Expenses: Not hard at all  Food Insecurity: No Food Insecurity (01/25/2024)   Hunger Vital Sign    Worried About Running Out of Food in the Last Year: Never true    Ran Out of Food in the Last Year: Never true  Transportation Needs: No Transportation Needs (01/25/2024)   PRAPARE - Administrator, Civil Service (Medical): No    Lack of Transportation (Non-Medical): No  Physical Activity: Insufficiently Active (01/25/2024)   Exercise Vital Sign    Days of Exercise per Week: 4 days    Minutes of Exercise per Session: 20 min  Stress: No Stress Concern Present (01/25/2024)   [employer] of Occupational Health - Occupational Stress Questionnaire    Feeling of Stress : Only a little  Social Connections: Socially Integrated (01/25/2024)   Social Connection and Isolation Panel [NHANES]    Frequency of Communication with Friends and Family: More than three times a week    Frequency of Social Gatherings with Friends and Family: Once a week    Attends Religious Services: More than 4 times per year    Active Member of [employer] or Organizations: Yes    Attends Banker Meetings: More than 4  times per year    Marital Status: Married  Catering manager Violence: Unknown (02/01/2022)   Received from [employer], [facility]   HITS    Physically Hurt: Not on file    Insult or Talk Down To: Not on file    Threaten Physical Harm: Not on file    Scream or Curse: Not on file   Social History   Tobacco Use  Smoking Status Never  Smokeless Tobacco Never   Social History  Substance and Sexual Activity  Alcohol Use No    Family History:  Family History  Problem Relation Age of Onset   Heart disease Mother    Mitral valve prolapse Father    Gout Father    Asthma Sister    Hypertension Maternal Grandmother    Cancer Cousin        AML    Past medical history, surgical history, medications, allergies, family history and social history reviewed with patient today and changes made to appropriate areas of the chart.   Review of Systems  Constitutional: Negative.   HENT: Negative.    Eyes:  Positive for redness. Negative for blurred vision, double vision, photophobia, pain and discharge.  Respiratory:  Positive for shortness of breath and wheezing. Negative for cough, hemoptysis and sputum production.   Cardiovascular: Negative.   Gastrointestinal: Negative.   Genitourinary: Negative.   Musculoskeletal:  Positive for joint pain. Negative for back pain, falls, myalgias and neck pain.  Skin: Negative.   Neurological: Negative.   Endo/Heme/Allergies: Negative.   Psychiatric/Behavioral: Negative.     All other ROS negative except what is listed above and in the HPI.      Objective:    BP 106/66 (BP Location: Left Arm, Patient Position: Sitting)   Pulse 77   Temp 97.8 F (36.6 C) (Oral)   Wt 170 lb 9.6 oz (77.4 kg)   BMI 31.20 kg/m   Wt Readings from Last 3 Encounters:  01/28/24 170 lb 9.6 oz (77.4 kg)  12/10/23 167 lb 8 oz (76 kg)  12/03/23 165 lb 6.4 oz (75 kg)    Physical Exam Vitals and nursing note reviewed.  Constitutional:      General: She is not  in acute distress.    Appearance: Normal appearance. She is not ill-appearing, toxic-appearing or diaphoretic.  HENT:     Head: Normocephalic and atraumatic.     Right Ear: Tympanic membrane, ear canal and external ear normal. There is no impacted cerumen.     Left Ear: Tympanic membrane, ear canal and external ear normal. There is no impacted cerumen.     Nose: Nose normal. No congestion or rhinorrhea.     Mouth/Throat:     Mouth: Mucous membranes are moist.     Pharynx: Oropharynx is clear. No oropharyngeal exudate or posterior oropharyngeal erythema.  Eyes:     General: No scleral icterus.       Right eye: No discharge.        Left eye: No discharge.     Extraocular Movements: Extraocular movements intact.     Conjunctiva/sclera: Conjunctivae normal.     Pupils: Pupils are equal, round, and reactive to light.  Neck:     Vascular: No carotid bruit.  Cardiovascular:     Rate and Rhythm: Normal rate and regular rhythm.     Pulses: Normal pulses.     Heart sounds: No murmur heard.    No friction rub. No gallop.  Pulmonary:     Effort: Pulmonary effort is normal. No respiratory distress.     Breath sounds: Normal breath sounds. No stridor. No wheezing, rhonchi or rales.  Chest:     Chest wall: No tenderness.  Abdominal:     General: Abdomen is flat. Bowel sounds are normal. There is no distension.     Palpations: Abdomen is soft. There is no mass.     Tenderness: There is no abdominal tenderness. There is no right CVA tenderness, left CVA tenderness, guarding or  rebound.     Hernia: No hernia is present.  Genitourinary:    Comments: Breast and pelvic exams deferred with shared decision making Musculoskeletal:        General: No swelling, tenderness, deformity or signs of injury.     Cervical back: Normal range of motion and neck supple. No rigidity. No muscular tenderness.     Right lower leg: No edema.     Left lower leg: No edema.  Lymphadenopathy:     Cervical: No cervical  adenopathy.  Skin:    General: Skin is warm and dry.     Capillary Refill: Capillary refill takes less than 2 seconds.     Coloration: Skin is not jaundiced or pale.     Findings: No bruising, erythema, lesion or rash.  Neurological:     General: No focal deficit present.     Mental Status: She is alert and oriented to person, place, and time. Mental status is at baseline.     Cranial Nerves: No cranial nerve deficit.     Sensory: No sensory deficit.     Motor: No weakness.     Coordination: Coordination normal.     Gait: Gait normal.     Deep Tendon Reflexes: Reflexes normal.  Psychiatric:        Mood and Affect: Mood normal.        Behavior: Behavior normal.        Thought Content: Thought content normal.        Judgment: Judgment normal.     Results for orders placed or performed in visit on 01/28/24  Bayer DCA Hb A1c Waived   Collection Time: 01/28/24  9:01 AM  Result Value Ref Range   HB A1C (BAYER DCA - WAIVED) 4.8 4.8 - 5.6 %      Assessment & Plan:   Problem List Items Addressed This Visit       Respiratory   Not well controlled severe persistent asthma   Will get her into pulmonology. Referral generated today.      Relevant Orders   Ambulatory referral to Pulmonology     Other   Anemia   Rechecking labs today. Await results. Treat as needed.       Relevant Orders   Iron Binding Cap (TIBC)([facility]/Sun[facility])   Ferritin   Other Visit Diagnoses       Routine general medical examination at a health care facility    -  Primary   Vaccines up to date. Screening labs checked today. Pap up to date. Continue diet and exercise. Call with any concerns.   Relevant Orders   CBC with Differential/Platelet   Comprehensive metabolic panel with GFR   Lipid Panel w/o Chol/HDL Ratio   TSH     History of hyperglycemia       A1c drawn today. Normal at 4.8.   Relevant Orders   Bayer DCA Hb A1c Waived (Completed)        Follow up plan: Return in about 6 months  (around 07/29/2024).   LABORATORY TESTING:  - Pap smear: up to date  IMMUNIZATIONS:   - Tdap: Tetanus vaccination status reviewed: last tetanus booster within 10 years. - Influenza: Up to date - Pneumovax: Up to date - Prevnar: Up to date - COVID: Up to date - HPV: Up to date - Shingrix vaccine: Not applicable  PATIENT COUNSELING:   Advised to take 1 mg of folate supplement per day if capable of pregnancy.   Sexuality: Discussed sexually  transmitted diseases, partner selection, use of condoms, avoidance of unintended pregnancy  and contraceptive alternatives.   Advised to avoid cigarette smoking.  I discussed with the patient that most people either abstain from alcohol or drink within safe limits (<=14/week and <=4 drinks/occasion for males, <=7/weeks and <= 3 drinks/occasion for females) and that the risk for alcohol disorders and other health effects rises proportionally with the number of drinks per week and how often a drinker exceeds daily limits.  Discussed cessation/primary prevention of drug use and availability of treatment for abuse.   Diet: Encouraged to adjust caloric intake to maintain  or achieve ideal body weight, to reduce intake of dietary saturated fat and total fat, to limit sodium intake by avoiding high sodium foods and not adding table salt, and to maintain adequate dietary potassium and calcium preferably from fresh fruits, vegetables, and low-fat dairy products.    stressed the importance of regular exercise  Injury prevention: Discussed safety belts, safety helmets, smoke detector, smoking near bedding or upholstery.   Dental health: Discussed importance of regular tooth brushing, flossing, and dental visits.    NEXT PREVENTATIVE PHYSICAL DUE IN 1 YEAR. Return in about 6 months (around 07/29/2024).

## 2024-01-29 LAB — IRON AND TIBC
Iron Saturation: 24 % (ref 15–55)
Iron: 69 ug/dL (ref 27–159)
Total Iron Binding Capacity: 287 ug/dL (ref 250–450)
UIBC: 218 ug/dL (ref 131–425)

## 2024-01-29 LAB — COMPREHENSIVE METABOLIC PANEL WITH GFR
ALT: 23 IU/L (ref 0–32)
AST: 23 IU/L (ref 0–40)
Albumin: 4.2 g/dL (ref 3.9–4.9)
Alkaline Phosphatase: 56 IU/L (ref 44–121)
BUN/Creatinine Ratio: 10 (ref 9–23)
BUN: 7 mg/dL (ref 6–20)
Bilirubin Total: 0.3 mg/dL (ref 0.0–1.2)
CO2: 27 mmol/L (ref 20–29)
Calcium: 9.2 mg/dL (ref 8.7–10.2)
Chloride: 100 mmol/L (ref 96–106)
Creatinine, Ser: 0.67 mg/dL (ref 0.57–1.00)
Globulin, Total: 2.2 g/dL (ref 1.5–4.5)
Glucose: 90 mg/dL (ref 70–99)
Potassium: 3.2 mmol/L — ABNORMAL LOW (ref 3.5–5.2)
Sodium: 141 mmol/L (ref 134–144)
Total Protein: 6.4 g/dL (ref 6.0–8.5)
eGFR: 115 mL/min/{1.73_m2} (ref >59)

## 2024-01-29 LAB — CBC WITH DIFFERENTIAL/PLATELET
Basophils Absolute: 0 10*3/uL (ref 0.0–0.2)
Basos: 0 %
EOS (ABSOLUTE): 0.1 10*3/uL (ref 0.0–0.4)
Eos: 1 %
Hematocrit: 38.1 % (ref 34.0–46.6)
Hemoglobin: 12.7 g/dL (ref 11.1–15.9)
Immature Grans (Abs): 0.1 10*3/uL (ref 0.0–0.1)
Immature Granulocytes: 1 %
Lymphocytes Absolute: 3 10*3/uL (ref 0.7–3.1)
Lymphs: 30 %
MCH: 32.4 pg (ref 26.6–33.0)
MCHC: 33.3 g/dL (ref 31.5–35.7)
MCV: 97 fL (ref 79–97)
Monocytes Absolute: 0.7 10*3/uL (ref 0.1–0.9)
Monocytes: 7 %
Neutrophils Absolute: 6 10*3/uL (ref 1.4–7.0)
Neutrophils: 61 %
Platelets: 331 10*3/uL (ref 150–450)
RBC: 3.92 x10E6/uL (ref 3.77–5.28)
RDW: 12.4 % (ref 11.7–15.4)
WBC: 9.8 10*3/uL (ref 3.4–10.8)

## 2024-01-29 LAB — LIPID PANEL W/O CHOL/HDL RATIO
Cholesterol, Total: 152 mg/dL (ref 100–199)
HDL: 69 mg/dL (ref >39)
LDL Chol Calc (NIH): 58 mg/dL (ref 0–99)
Triglycerides: 150 mg/dL — ABNORMAL HIGH (ref 0–149)
VLDL Cholesterol Cal: 25 mg/dL (ref 5–40)

## 2024-01-29 LAB — TSH: TSH: 1.92 u[IU]/mL (ref 0.450–4.500)

## 2024-01-29 LAB — FERRITIN: Ferritin: 168 ng/mL — ABNORMAL HIGH (ref 15–150)

## 2024-01-30 ENCOUNTER — Encounter: Payer: Self-pay | Admitting: Family Medicine

## 2024-02-02 DIAGNOSIS — H40003 Preglaucoma, unspecified, bilateral: Secondary | ICD-10-CM | POA: Diagnosis not present

## 2024-02-04 ENCOUNTER — Encounter: Payer: Self-pay | Admitting: Internal Medicine

## 2024-02-16 ENCOUNTER — Other Ambulatory Visit: Payer: Self-pay | Admitting: Internal Medicine

## 2024-02-16 ENCOUNTER — Other Ambulatory Visit: Payer: Self-pay | Admitting: Family Medicine

## 2024-02-16 DIAGNOSIS — S63641A Sprain of metacarpophalangeal joint of right thumb, initial encounter: Secondary | ICD-10-CM | POA: Diagnosis not present

## 2024-02-16 DIAGNOSIS — S60011A Contusion of right thumb without damage to nail, initial encounter: Secondary | ICD-10-CM | POA: Diagnosis not present

## 2024-02-16 NOTE — Telephone Encounter (Signed)
 Requested Prescriptions  Refused Prescriptions Disp Refills   albuterol  (VENTOLIN  HFA) 108 (90 Base) MCG/ACT inhaler [Pharmacy Med Name: ALBUTEROL  SULFATE HFA 108 (90 BASE)] 6.7 g     Sig: INHALE 2 PUFFS INTO LUNGS EVERY 6 HOURS AS NEEDED FOR WHEEZING OR SHORTNESS OF BREATH     Pulmonology:  Beta Agonists 2 Passed - 02/16/2024  5:56 PM      Passed - Last BP in normal range    BP Readings from Last 1 Encounters:  01/28/24 106/66         Passed - Last Heart Rate in normal range    Pulse Readings from Last 1 Encounters:  01/28/24 77         Passed - Valid encounter within last 12 months    Recent Outpatient Visits           2 weeks ago Routine general medical examination at a health care facility   [facility] [place] P, DO   2 months ago Acute pain of left knee   [facility] Solomon Dupre, DO       Future Appointments             In 3 months Orelia Binet, MD [facility]

## 2024-02-18 ENCOUNTER — Encounter: Payer: Self-pay | Admitting: Family Medicine

## 2024-02-18 ENCOUNTER — Ambulatory Visit (INDEPENDENT_AMBULATORY_CARE_PROVIDER_SITE_OTHER): Payer: BC Managed Care – PPO | Admitting: Family Medicine

## 2024-02-18 VITALS — BP 107/73 | HR 91 | Ht 62.0 in | Wt 170.0 lb

## 2024-02-18 DIAGNOSIS — M9908 Segmental and somatic dysfunction of rib cage: Secondary | ICD-10-CM

## 2024-02-18 DIAGNOSIS — M9903 Segmental and somatic dysfunction of lumbar region: Secondary | ICD-10-CM

## 2024-02-18 DIAGNOSIS — M542 Cervicalgia: Secondary | ICD-10-CM

## 2024-02-18 DIAGNOSIS — M9905 Segmental and somatic dysfunction of pelvic region: Secondary | ICD-10-CM

## 2024-02-18 DIAGNOSIS — M9904 Segmental and somatic dysfunction of sacral region: Secondary | ICD-10-CM

## 2024-02-18 DIAGNOSIS — M9901 Segmental and somatic dysfunction of cervical region: Secondary | ICD-10-CM

## 2024-02-18 DIAGNOSIS — M9902 Segmental and somatic dysfunction of thoracic region: Secondary | ICD-10-CM | POA: Diagnosis not present

## 2024-02-18 DIAGNOSIS — M9909 Segmental and somatic dysfunction of abdomen and other regions: Secondary | ICD-10-CM

## 2024-02-18 DIAGNOSIS — M9900 Segmental and somatic dysfunction of head region: Secondary | ICD-10-CM

## 2024-02-18 NOTE — Progress Notes (Signed)
 BP 107/73 (BP Location: Left Arm, Patient Position: Sitting, Cuff Size: Normal)   Pulse 91   Ht 5\' 2"  (1.575 m)   Wt 170 lb (77.1 kg)   BMI 31.09 kg/m    Subjective:    Patient ID: Karla Huber, female    DOB: [birth year], 38 y.o.   MRN: [id number]  HPI: Karla Huber is a 38 y.o. female  Chief Complaint  Patient presents with   Neck Pain   Karla Huber presents today for evaluation and possible treatment with OMT for neck pain. She notes that she has been feeling tight and crunched in her neck. Pain is radiating into her head and into her shoulders. She also notes that her thumb has been hurting since the injury. She is better with OMT and rest. Worse with stress and certain positions. She is otherwise doing well with no other concerns or complaints at this time.   Relevant past medical, surgical, family and social history reviewed and updated as indicated. Interim medical history since our last visit reviewed. Allergies and medications reviewed and updated.  Review of Systems  Constitutional: Negative.   Respiratory: Negative.    Cardiovascular: Negative.   Musculoskeletal:  Positive for arthralgias, myalgias and neck pain. Negative for back pain, gait problem, joint swelling and neck stiffness.  Skin: Negative.   Neurological: Negative.   Psychiatric/Behavioral: Negative.      Per HPI unless specifically indicated above     Objective:    BP 107/73 (BP Location: Left Arm, Patient Position: Sitting, Cuff Size: Normal)   Pulse 91   Ht 5\' 2"  (1.575 m)   Wt 170 lb (77.1 kg)   BMI 31.09 kg/m   Wt Readings from Last 3 Encounters:  02/18/24 170 lb (77.1 kg)  01/28/24 170 lb 9.6 oz (77.4 kg)  12/10/23 167 lb 8 oz (76 kg)    Physical Exam Vitals and nursing note reviewed.  Constitutional:      General: She is not in acute distress.    Appearance: Normal appearance. She is not ill-appearing.  HENT:     Head: Normocephalic and atraumatic.     Right Ear: External ear  normal.     Left Ear: External ear normal.     Nose: Nose normal.     Mouth/Throat:     Mouth: Mucous membranes are moist.     Pharynx: Oropharynx is clear.  Eyes:     Extraocular Movements: Extraocular movements intact.     Conjunctiva/sclera: Conjunctivae normal.     Pupils: Pupils are equal, round, and reactive to light.  Neck:     Vascular: No carotid bruit.  Cardiovascular:     Rate and Rhythm: Normal rate.     Pulses: Normal pulses.  Pulmonary:     Effort: Pulmonary effort is normal. No respiratory distress.  Abdominal:     General: Abdomen is flat. There is no distension.     Palpations: Abdomen is soft. There is no mass.     Tenderness: There is no abdominal tenderness. There is no right CVA tenderness, left CVA tenderness, guarding or rebound.     Hernia: No hernia is present.  Musculoskeletal:     Cervical back: No muscular tenderness.  Lymphadenopathy:     Cervical: No cervical adenopathy.  Skin:    General: Skin is warm and dry.     Capillary Refill: Capillary refill takes less than 2 seconds.     Coloration: Skin is not jaundiced or pale.  Findings: No bruising, erythema, lesion or rash.  Neurological:     General: No focal deficit present.     Mental Status: She is alert. Mental status is at baseline.  Psychiatric:        Mood and Affect: Mood normal.        Behavior: Behavior normal.        Thought Content: Thought content normal.        Judgment: Judgment normal.    Musculoskeletal:  Exam found Decreased ROM, Tissue texture changes, Tenderness to palpation, and Asymmetry of patient's  head, neck, thorax, ribs, lumbar, pelvis, sacrum, and abdomen Osteopathic Structural Exam:   Head: OAESSR, hypertonic suboccipital muscles, SBS SRR  Neck: C4ESRR, SCM hypertonic on the R  Thorax:T3-6SLRR  Ribs: Ribs 4-8 locked up on the L  Lumbar: QL hypertonic on the L, L3-4SLRR  Pelvis: Anterior L innominate  Sacrum: L on L torsion  Abdomen: diaphragm hypertonic on  the L  Results for orders placed or performed in visit on 01/28/24  Bayer DCA Hb A1c Waived   Collection Time: 01/28/24  9:01 AM  Result Value Ref Range   HB A1C (BAYER DCA - WAIVED) 4.8 4.8 - 5.6 %  CBC with Differential/Platelet   Collection Time: 01/28/24  9:02 AM  Result Value Ref Range   WBC 9.8 3.4 - 10.8 x10E3/uL   RBC 3.92 3.77 - 5.28 x10E6/uL   Hemoglobin 12.7 11.1 - 15.9 g/dL   Hematocrit 16.1 09.6 - 46.6 %   MCV 97 79 - 97 fL   MCH 32.4 26.6 - 33.0 pg   MCHC 33.3 31.5 - 35.7 g/dL   RDW 04.5 40.9 - 81.1 %   Platelets 331 150 - 450 x10E3/uL   Neutrophils 61 Not Estab. %   Lymphs 30 Not Estab. %   Monocytes 7 Not Estab. %   Eos 1 Not Estab. %   Basos 0 Not Estab. %   Neutrophils Absolute 6.0 1.4 - 7.0 x10E3/uL   Lymphocytes Absolute 3.0 0.7 - 3.1 x10E3/uL   Monocytes Absolute 0.7 0.1 - 0.9 x10E3/uL   EOS (ABSOLUTE) 0.1 0.0 - 0.4 x10E3/uL   Basophils Absolute 0.0 0.0 - 0.2 x10E3/uL   Immature Granulocytes 1 Not Estab. %   Immature Grans (Abs) 0.1 0.0 - 0.1 x10E3/uL  Comprehensive metabolic panel with GFR   Collection Time: 01/28/24  9:02 AM  Result Value Ref Range   Glucose 90 70 - 99 mg/dL   BUN 7 6 - 20 mg/dL   Creatinine, Ser 9.14 0.57 - 1.00 mg/dL   eGFR 782 >95 AO/ZHY/8.65   BUN/Creatinine Ratio 10 9 - 23   Sodium 141 134 - 144 mmol/L   Potassium 3.2 (L) 3.5 - 5.2 mmol/L   Chloride 100 96 - 106 mmol/L   CO2 27 20 - 29 mmol/L   Calcium 9.2 8.7 - 10.2 mg/dL   Total Protein 6.4 6.0 - 8.5 g/dL   Albumin 4.2 3.9 - 4.9 g/dL   Globulin, Total 2.2 1.5 - 4.5 g/dL   Bilirubin Total 0.3 0.0 - 1.2 mg/dL   Alkaline Phosphatase 56 44 - 121 IU/L   AST 23 0 - 40 IU/L   ALT 23 0 - 32 IU/L  Lipid Panel w/o Chol/HDL Ratio   Collection Time: 01/28/24  9:02 AM  Result Value Ref Range   Cholesterol, Total 152 100 - 199 mg/dL   Triglycerides 784 (H) 0 - 149 mg/dL   HDL 69 >69 mg/dL   VLDL  Cholesterol Cal 25 5 - 40 mg/dL   LDL Chol Calc (NIH) 58 0 - 99 mg/dL  TSH    Collection Time: 01/28/24  9:02 AM  Result Value Ref Range   TSH 1.920 0.450 - 4.500 uIU/mL  Iron  Binding Cap (TIBC)([facility])   Collection Time: 01/28/24  9:02 AM  Result Value Ref Range   Total Iron  Binding Capacity 287 250 - 450 ug/dL   UIBC 409 811 - 914 ug/dL   Iron  69 27 - 159 ug/dL   Iron  Saturation 24 15 - 55 %  Ferritin   Collection Time: 01/28/24  9:02 AM  Result Value Ref Range   Ferritin 168 (H) 15 - 150 ng/mL      Assessment & Plan:   Problem List Items Addressed This Visit   None Visit Diagnoses       Neck pain    -  Primary   Likely myofascial in nature. She does have somatic dysfunction that is contributing to her symptoms. Treated today with good results as below. Call w concerns.     Thoracic segment dysfunction         Somatic dysfunction of lumbar region         Somatic dysfunction of sacral region         Somatic dysfunction of pelvis region         Rib cage region somatic dysfunction         Segmental dysfunction of abdomen         Cervical segment dysfunction         Head region somatic dysfunction          After verbal consent was obtained, patient was treated today with osteopathic manipulative medicine to the regions of the head, neck, thorax, ribs, lumbar, pelvis, sacrum, and abdomen using the techniques of cranial, myofascial release, counterstrain, muscle energy, HVLA, and soft tissue. Areas of compensation relating to her primary pain source also treated. Patient tolerated the procedure well with good objective and good subjective improvement in symptoms. she left the room in good condition. She was advised to stay well hydrated and that she may have some soreness following the procedure. If not improving or worsening, she will call and come in. She will return for reevaluation  on a PRN basis.   Follow up plan: Return if symptoms worsen or fail to improve.

## 2024-02-20 DIAGNOSIS — M79646 Pain in unspecified finger(s): Secondary | ICD-10-CM | POA: Diagnosis not present

## 2024-02-27 DIAGNOSIS — M79645 Pain in left finger(s): Secondary | ICD-10-CM | POA: Diagnosis not present

## 2024-03-05 DIAGNOSIS — M79645 Pain in left finger(s): Secondary | ICD-10-CM | POA: Diagnosis not present

## 2024-03-11 DIAGNOSIS — J309 Allergic rhinitis, unspecified: Secondary | ICD-10-CM | POA: Diagnosis not present

## 2024-03-11 DIAGNOSIS — J4550 Severe persistent asthma, uncomplicated: Secondary | ICD-10-CM | POA: Diagnosis not present

## 2024-03-11 DIAGNOSIS — K219 Gastro-esophageal reflux disease without esophagitis: Secondary | ICD-10-CM | POA: Diagnosis not present

## 2024-03-14 ENCOUNTER — Telehealth: Admitting: Family

## 2024-03-14 DIAGNOSIS — R399 Unspecified symptoms and signs involving the genitourinary system: Secondary | ICD-10-CM | POA: Diagnosis not present

## 2024-03-14 MED ORDER — SULFAMETHOXAZOLE-TRIMETHOPRIM 800-160 MG PO TABS: 1.0000 | ORAL_TABLET | Freq: Two times a day (BID) | ORAL | 0 refills | Status: DC

## 2024-03-14 NOTE — Progress Notes (Signed)
E-Visit for Urinary Problems  We are sorry that you are not feeling well.  Here is how we plan to help!  Based on what you shared with me it looks like you most likely have a simple urinary tract infection.  A UTI (Urinary Tract Infection) is a bacterial infection of the bladder.  Most cases of urinary tract infections are simple to treat but a key part of your care is to encourage you to drink plenty of fluids and watch your symptoms carefully.  I have prescribed Bactrim DS One tablet twice a day for 5 days.  Your symptoms should gradually improve. Call us if the burning in your urine worsens, you develop worsening fever, back pain or pelvic pain or if your symptoms do not resolve after completing the antibiotic.  Urinary tract infections can be prevented by drinking plenty of water to keep your body hydrated.  Also be sure when you wipe, wipe from front to back and don't hold it in!  If possible, empty your bladder every 4 hours.  HOME CARE Drink plenty of fluids Compete the full course of the antibiotics even if the symptoms resolve Remember, when you need to go.go. Holding in your urine can increase the likelihood of getting a UTI! GET HELP RIGHT AWAY IF: You cannot urinate You get a high fever Worsening back pain occurs You see blood in your urine You feel sick to your stomach or throw up You feel like you are going to pass out  MAKE SURE YOU  Understand these instructions. Will watch your condition. Will get help right away if you are not doing well or get worse.   Thank you for choosing an e-visit.  Your e-visit answers were reviewed by a board certified advanced clinical practitioner to complete your personal care plan. Depending upon the condition, your plan could have included both over the counter or prescription medications.  Please review your pharmacy choice. Make sure the pharmacy is open so you can pick up prescription now. If there is a problem, you may contact  your provider through [employer] and have the prescription routed to another pharmacy.  Your safety is important to [place]. If you have drug allergies check your prescription carefully.   For the next 24 hours you can use MyChart to ask questions about today's visit, request a non-urgent call back, or ask for a work or school excuse. You will get an email in the next two days asking about your experience. I hope that your e-visit has been valuable and will speed your recovery.  Approximately 5 minutes was spent documenting and reviewing patient's chart.

## 2024-03-17 DIAGNOSIS — J4550 Severe persistent asthma, uncomplicated: Secondary | ICD-10-CM | POA: Diagnosis not present

## 2024-03-17 DIAGNOSIS — R918 Other nonspecific abnormal finding of lung field: Secondary | ICD-10-CM | POA: Diagnosis not present

## 2024-03-19 DIAGNOSIS — M79645 Pain in left finger(s): Secondary | ICD-10-CM | POA: Diagnosis not present

## 2024-03-31 DIAGNOSIS — M79645 Pain in left finger(s): Secondary | ICD-10-CM | POA: Diagnosis not present

## 2024-04-08 DIAGNOSIS — M79645 Pain in left finger(s): Secondary | ICD-10-CM | POA: Diagnosis not present

## 2024-04-13 DIAGNOSIS — M79645 Pain in left finger(s): Secondary | ICD-10-CM | POA: Diagnosis not present

## 2024-04-14 ENCOUNTER — Ambulatory Visit: Admitting: Nurse Practitioner

## 2024-04-15 ENCOUNTER — Encounter: Payer: Self-pay | Admitting: Internal Medicine

## 2024-04-15 ENCOUNTER — Other Ambulatory Visit: Payer: Self-pay | Admitting: *Deleted

## 2024-04-15 MED ORDER — IPRATROPIUM BROMIDE 0.06 % NA SOLN: 2.0000 | Freq: Two times a day (BID) | NASAL | 5 refills | Status: DC | PRN

## 2024-04-19 DIAGNOSIS — M79645 Pain in left finger(s): Secondary | ICD-10-CM | POA: Diagnosis not present

## 2024-04-20 ENCOUNTER — Ambulatory Visit (INDEPENDENT_AMBULATORY_CARE_PROVIDER_SITE_OTHER): Admitting: Family Medicine

## 2024-04-20 ENCOUNTER — Encounter: Payer: Self-pay | Admitting: Family Medicine

## 2024-04-20 ENCOUNTER — Ambulatory Visit: Admitting: Family Medicine

## 2024-04-20 VITALS — BP 116/83 | HR 86

## 2024-04-20 DIAGNOSIS — M542 Cervicalgia: Secondary | ICD-10-CM | POA: Diagnosis not present

## 2024-04-20 DIAGNOSIS — M9903 Segmental and somatic dysfunction of lumbar region: Secondary | ICD-10-CM

## 2024-04-20 DIAGNOSIS — M9904 Segmental and somatic dysfunction of sacral region: Secondary | ICD-10-CM

## 2024-04-20 DIAGNOSIS — M9909 Segmental and somatic dysfunction of abdomen and other regions: Secondary | ICD-10-CM | POA: Diagnosis not present

## 2024-04-20 DIAGNOSIS — M9908 Segmental and somatic dysfunction of rib cage: Secondary | ICD-10-CM | POA: Diagnosis not present

## 2024-04-20 DIAGNOSIS — M9901 Segmental and somatic dysfunction of cervical region: Secondary | ICD-10-CM

## 2024-04-20 DIAGNOSIS — M9900 Segmental and somatic dysfunction of head region: Secondary | ICD-10-CM

## 2024-04-20 DIAGNOSIS — M9902 Segmental and somatic dysfunction of thoracic region: Secondary | ICD-10-CM

## 2024-04-20 DIAGNOSIS — M9905 Segmental and somatic dysfunction of pelvic region: Secondary | ICD-10-CM

## 2024-04-20 NOTE — Progress Notes (Signed)
 BP 116/83 (BP Location: Left Arm, Patient Position: Sitting, Cuff Size: Normal)   Pulse 86    Subjective:    Patient ID: Karla Huber, female    DOB: July 30, 1986, 38 y.o.   MRN: [id number]  HPI: Karla Huber is a 38 y.o. female  Chief Complaint  Patient presents with   Neck Pain   Karla Huber presents today for evaluation and possible treatment with OMT for neck pain. She notes that she has been doing OK. She has been really stressed out- her father has been in the hospital and that is making her anxious. Pain is tight and pulling. Better with OMT and worse with increased stress. No numbness or tingling. No radiation. No other concerns or complaints at this time.   Relevant past medical, surgical, family and social history reviewed and updated as indicated. Interim medical history since our last visit reviewed. Allergies and medications reviewed and updated.  Review of Systems  Constitutional: Negative.   Respiratory: Negative.    Cardiovascular: Negative.   Musculoskeletal:  Positive for myalgias, neck pain and neck stiffness. Negative for arthralgias, back pain, gait problem and joint swelling.  Psychiatric/Behavioral:  Negative for agitation, behavioral problems, confusion, decreased concentration, dysphoric mood, hallucinations, self-injury, sleep disturbance and suicidal ideas. The patient is nervous/anxious. The patient is not hyperactive.     Per HPI unless specifically indicated above     Objective:    BP 116/83 (BP Location: Left Arm, Patient Position: Sitting, Cuff Size: Normal)   Pulse 86   Wt Readings from Last 3 Encounters:  02/18/24 170 lb (77.1 kg)  01/28/24 170 lb 9.6 oz (77.4 kg)  12/10/23 167 lb 8 oz (76 kg)    Physical Exam Vitals and nursing note reviewed.  Constitutional:      General: She is not in acute distress.    Appearance: Normal appearance. She is not ill-appearing.  HENT:     Head: Normocephalic and atraumatic.     Right Ear: External ear  normal.     Left Ear: External ear normal.     Nose: Nose normal.     Mouth/Throat:     Mouth: Mucous membranes are moist.     Pharynx: Oropharynx is clear.   Eyes:     Extraocular Movements: Extraocular movements intact.     Conjunctiva/sclera: Conjunctivae normal.     Pupils: Pupils are equal, round, and reactive to light.   Neck:     Vascular: No carotid bruit.   Cardiovascular:     Rate and Rhythm: Normal rate.     Pulses: Normal pulses.  Pulmonary:     Effort: Pulmonary effort is normal. No respiratory distress.  Abdominal:     General: Abdomen is flat. There is no distension.     Palpations: Abdomen is soft. There is no mass.     Tenderness: There is no abdominal tenderness. There is no right CVA tenderness, left CVA tenderness, guarding or rebound.     Hernia: No hernia is present.   Musculoskeletal:     Cervical back: No muscular tenderness.  Lymphadenopathy:     Cervical: No cervical adenopathy.   Skin:    General: Skin is warm and dry.     Capillary Refill: Capillary refill takes less than 2 seconds.     Coloration: Skin is not jaundiced or pale.     Findings: No bruising, erythema, lesion or rash.   Neurological:     General: No focal deficit present.     Mental  Status: She is alert. Mental status is at baseline.   Psychiatric:        Mood and Affect: Mood normal.        Behavior: Behavior normal.        Thought Content: Thought content normal.        Judgment: Judgment normal.   Musculoskeletal:  Exam found Decreased ROM, Tissue texture changes, Tenderness to palpation, and Asymmetry of patient's  head, neck, thorax, ribs, lumbar, pelvis, sacrum, and abdomen Osteopathic Structural Exam:   Head: OAESSR, hypertonic suboccipital muscles  Neck: C4ESRR, SCM hypertonic on the R  Thorax: T3-5SRRL  Ribs: Ribs 5-7 locked up on the R  Lumbar: QL hypertonic on the R  Pelvis: Anterior R innominate  Sacrum: R on R torsion  Abdomen: QL hypertonic on the  R   Results for orders placed or performed in visit on 01/28/24  Bayer DCA Hb A1c Waived   Collection Time: 01/28/24  9:01 AM  Result Value Ref Range   HB A1C (BAYER DCA - WAIVED) 4.8 4.8 - 5.6 %  CBC with Differential/Platelet   Collection Time: 01/28/24  9:02 AM  Result Value Ref Range   WBC 9.8 3.4 - 10.8 x10E3/uL   RBC 3.92 3.77 - 5.28 x10E6/uL   Hemoglobin 12.7 11.1 - 15.9 g/dL   Hematocrit 61.8 65.9 - 46.6 %   MCV 97 79 - 97 fL   MCH 32.4 26.6 - 33.0 pg   MCHC 33.3 31.5 - 35.7 g/dL   RDW 87.5 88.2 - 84.5 %   Platelets 331 150 - 450 x10E3/uL   Neutrophils 61 Not Estab. %   Lymphs 30 Not Estab. %   Monocytes 7 Not Estab. %   Eos 1 Not Estab. %   Basos 0 Not Estab. %   Neutrophils Absolute 6.0 1.4 - 7.0 x10E3/uL   Lymphocytes Absolute 3.0 0.7 - 3.1 x10E3/uL   Monocytes Absolute 0.7 0.1 - 0.9 x10E3/uL   EOS (ABSOLUTE) 0.1 0.0 - 0.4 x10E3/uL   Basophils Absolute 0.0 0.0 - 0.2 x10E3/uL   Immature Granulocytes 1 Not Estab. %   Immature Grans (Abs) 0.1 0.0 - 0.1 x10E3/uL  Comprehensive metabolic panel with GFR   Collection Time: 01/28/24  9:02 AM  Result Value Ref Range   Glucose 90 70 - 99 mg/dL   BUN 7 6 - 20 mg/dL   Creatinine, Ser 9.32 0.57 - 1.00 mg/dL   eGFR 884 >40 fO/fpw/8.26   BUN/Creatinine Ratio 10 9 - 23   Sodium 141 134 - 144 mmol/L   Potassium 3.2 (L) 3.5 - 5.2 mmol/L   Chloride 100 96 - 106 mmol/L   CO2 27 20 - 29 mmol/L   Calcium 9.2 8.7 - 10.2 mg/dL   Total Protein 6.4 6.0 - 8.5 g/dL   Albumin 4.2 3.9 - 4.9 g/dL   Globulin, Total 2.2 1.5 - 4.5 g/dL   Bilirubin Total 0.3 0.0 - 1.2 mg/dL   Alkaline Phosphatase 56 44 - 121 IU/L   AST 23 0 - 40 IU/L   ALT 23 0 - 32 IU/L  Lipid Panel w/o Chol/HDL Ratio   Collection Time: 01/28/24  9:02 AM  Result Value Ref Range   Cholesterol, Total 152 100 - 199 mg/dL   Triglycerides 849 (H) 0 - 149 mg/dL   HDL 69 >60 mg/dL   VLDL Cholesterol Cal 25 5 - 40 mg/dL   LDL Chol Calc (NIH) 58 0 - 99 mg/dL  TSH    Collection  Time: 01/28/24  9:02 AM  Result Value Ref Range   TSH 1.920 0.450 - 4.500 uIU/mL  Iron  Binding Cap (TIBC)([facility])   Collection Time: 01/28/24  9:02 AM  Result Value Ref Range   Total Iron  Binding Capacity 287 250 - 450 ug/dL   UIBC 781 868 - 574 ug/dL   Iron  69 27 - 159 ug/dL   Iron  Saturation 24 15 - 55 %  Ferritin   Collection Time: 01/28/24  9:02 AM  Result Value Ref Range   Ferritin 168 (H) 15 - 150 ng/mL      Assessment & Plan:   Problem List Items Addressed This Visit   None Visit Diagnoses       Neck pain    -  Primary   She does have somatic dysfunciton that is contributing to her symptoms. Treated today with good results as below. Call with any concerns.     Thoracic segment dysfunction         Somatic dysfunction of lumbar region         Somatic dysfunction of sacral region         Somatic dysfunction of pelvis region         Rib cage region somatic dysfunction         Segmental dysfunction of abdomen         Cervical segment dysfunction         Head region somatic dysfunction          After verbal consent was obtained, patient was treated today with osteopathic manipulative medicine to the regions of the head, neck, thorax, ribs, lumbar, pelvis, sacrum, and abdomen using the techniques of cranial, myofascial release, counterstrain, muscle energy, HVLA, and soft tissue. Areas of compensation relating to her primary pain source also treated. Patient tolerated the procedure well with good objective and good subjective improvement in symptoms. She left the room in good condition. She was advised to stay well hydrated and that she may have some soreness following the procedure. If not improving or worsening, she will call and come in. She will return for reevaluation  on a PRN basis.   Follow up plan: Return if symptoms worsen or fail to improve.

## 2024-05-07 DIAGNOSIS — M79645 Pain in left finger(s): Secondary | ICD-10-CM | POA: Diagnosis not present

## 2024-05-14 DIAGNOSIS — M79645 Pain in left finger(s): Secondary | ICD-10-CM | POA: Diagnosis not present

## 2024-05-19 DIAGNOSIS — M79645 Pain in left finger(s): Secondary | ICD-10-CM | POA: Diagnosis not present

## 2024-05-22 ENCOUNTER — Encounter

## 2024-05-24 DIAGNOSIS — K219 Gastro-esophageal reflux disease without esophagitis: Secondary | ICD-10-CM | POA: Diagnosis not present

## 2024-05-24 DIAGNOSIS — J4550 Severe persistent asthma, uncomplicated: Secondary | ICD-10-CM | POA: Diagnosis not present

## 2024-05-24 DIAGNOSIS — J309 Allergic rhinitis, unspecified: Secondary | ICD-10-CM | POA: Diagnosis not present

## 2024-05-26 DIAGNOSIS — M79645 Pain in left finger(s): Secondary | ICD-10-CM | POA: Diagnosis not present

## 2024-06-02 ENCOUNTER — Other Ambulatory Visit: Payer: Self-pay | Admitting: Family Medicine

## 2024-06-02 DIAGNOSIS — M79645 Pain in left finger(s): Secondary | ICD-10-CM | POA: Diagnosis not present

## 2024-06-02 DIAGNOSIS — N6452 Nipple discharge: Secondary | ICD-10-CM

## 2024-06-02 DIAGNOSIS — N644 Mastodynia: Secondary | ICD-10-CM

## 2024-06-07 DIAGNOSIS — M79645 Pain in left finger(s): Secondary | ICD-10-CM | POA: Diagnosis not present

## 2024-06-09 ENCOUNTER — Ambulatory Visit (INDEPENDENT_AMBULATORY_CARE_PROVIDER_SITE_OTHER): Admitting: Internal Medicine

## 2024-06-09 ENCOUNTER — Encounter: Payer: Self-pay | Admitting: Internal Medicine

## 2024-06-09 ENCOUNTER — Ambulatory Visit: Payer: BC Managed Care – PPO | Admitting: Internal Medicine

## 2024-06-09 ENCOUNTER — Other Ambulatory Visit: Payer: Self-pay | Admitting: Family Medicine

## 2024-06-09 ENCOUNTER — Ambulatory Visit
Admission: RE | Admit: 2024-06-09 | Discharge: 2024-06-09 | Disposition: A | Discharge status: Home / Self Care | Source: Ambulatory Visit | Attending: Family Medicine | Admitting: Family Medicine

## 2024-06-09 VITALS — BP 102/78 | HR 75 | Temp 98.6°F | Resp 24 | Ht 64.0 in

## 2024-06-09 DIAGNOSIS — K219 Gastro-esophageal reflux disease without esophagitis: Secondary | ICD-10-CM | POA: Diagnosis not present

## 2024-06-09 DIAGNOSIS — N6452 Nipple discharge: Secondary | ICD-10-CM

## 2024-06-09 DIAGNOSIS — N644 Mastodynia: Secondary | ICD-10-CM

## 2024-06-09 DIAGNOSIS — R921 Mammographic calcification found on diagnostic imaging of breast: Secondary | ICD-10-CM

## 2024-06-09 DIAGNOSIS — J029 Acute pharyngitis, unspecified: Secondary | ICD-10-CM

## 2024-06-09 DIAGNOSIS — J3089 Other allergic rhinitis: Secondary | ICD-10-CM | POA: Diagnosis not present

## 2024-06-09 DIAGNOSIS — J4550 Severe persistent asthma, uncomplicated: Secondary | ICD-10-CM | POA: Diagnosis not present

## 2024-06-09 DIAGNOSIS — R928 Other abnormal and inconclusive findings on diagnostic imaging of breast: Secondary | ICD-10-CM | POA: Diagnosis not present

## 2024-06-09 NOTE — Patient Instructions (Addendum)
 Asthma, severe persistent, well controlled  Breathing test showed: looked good  Continue Breztri  160mcg 2 puffs twice daily with spacer.   -rinse mouth after use  For increased symptoms or URI add on symbicort  2 puffs twice daily for 1-2 weeks  Continue montelukast  10 mg once a day to prevent cough or wheeze Continue Combivent   or symbicort  2 puffs every 4 hours as needed for cough or wheeze OR Instead use duoneb solution via nebulizer one unit vial every 4 hours as needed for cough or wheeze Continue tezspire  injections every 4 weeks  Get annual influenza vaccine   Allergic rhinitis Continue carbinoxamine  4-8 mg  three times a day  Continue Flonase  2.5 mg once a day for stuffy nose Continue ipratropium 2 sprays each nostril twice a day as needed Consider saline nasal rinses as needed for nasal symptoms. Use this before any medicated nasal sprays for best result Consider updating your environmental allergy testing when you are breathing has returned to baseline  Reflux Continue dietary and lifestyle modifications as listed below Continue omeprazole  to 20mg  twice daily We will refer you to GI - Dr. Glendia Holt for reflux evaluations   Follow up: 6 months   Thank you so much for letting me partake in your care today.  Don't hesitate to reach out if you have any additional concerns!  Hargis Springer, MD  [facility] [place], [place]

## 2024-06-09 NOTE — Progress Notes (Unsigned)
 FOLLOW UP Date of Service/Encounter:  06/09/24  Subjective:  Karla Huber (DOB: 10-27-1986) is a 38 y.o. female who [facility] in re-evaluation of the following: Persistent asthma, allergic rhinitis, reflux History obtained from: chart review and patient.  For Review, LV was on 12/10/23  with Dr. Lorin seen for routine follow-up. See below for summary of history and diagnostics.   Therapeutic plans/changes recommended: Stepdown to Symbicort , increased omeprazole  and carbinoxamine  ----------------------------------------------------- Pertinent History/Diagnostics:  Asthma/AERD : Dx at 38 years old, 2 OCS 2024, triggered by URI, seasonal changes, ibuprofen , peanut butter Rx: Breztri , DuoNebs, montelukast , Dupixent  Previous Rx: Symbicort  160, Airsupra  -Mild restriction spirometry (11//24): ratio 83, 2.36 L, 77% FEV1 (pre),  Allergic Rhinitis:  Year-round, triggers with weather changes, cats; hyposmia with nasal symptoms Rx: Carbinoxamine , Flonase , ipratropium, montelukast   - SPT environmental panel  not done yet  Other: GERD  --------------------------------------------------- Today presents for follow-up. Discussed the use of AI scribe software for clinical note transcription with the patient, who gave verbal consent to proceed.  History of Present Illness    Discussed the use of AI scribe software for clinical note transcription with the patient, who gave verbal consent to proceed.  History of Present Illness [name] is a 38 year old female with asthma and reflux who presents for follow-up of her respiratory symptoms and reflux management. She was referred by her PCP to Dr. McQuaid for further evaluation of her asthma and reflux symptoms.  Asthma symptoms and management - Asthma previously managed with Symbicort , which was ineffective - Transitioned to Breztri  by Dr. Alaine, resulting in improved symptom control -  Currently on Breztri  with generally well-controlled asthma symptoms - Increased need for albuterol  as the three-week interval post-Tezspire  injection approaches - Nocturnal wheezing, particularly as the next Tezspire  dose is due  Gastroesophageal reflux and esophageal dilation - History of gastroesophageal reflux since childhood, with exacerbations during pregnancies and persistence of symptoms since then - Recent CT scan demonstrated esophageal dilation, as communicated by Dr. Alaine - Diagnosed with gastritis during a college endoscopy; no treatment initiated at that time - Occasional sore throats associated with reflux flare-ups - Currently taking omeprazole , which has been effective until a recent flare-up possibly related to a change in vitamin supplements      All medications reviewed by clinical staff and updated in chart. No new pertinent medical or surgical history except as noted in HPI.  ROS: All others negative except as noted per HPI.   Objective:  BP 102/78 (BP Location: Left Arm, Patient Position: Sitting)   Pulse 75   Temp 98.6 F (37 C) (Temporal)   Resp (!) 24   Ht 5' 4 (1.626 m)   LMP 06/02/2024 (Approximate)   SpO2 97%   BMI 29.18 kg/m  Body mass index is 29.18 kg/m. Physical Exam: General Appearance:  Alert, cooperative, no distress, appears stated age  Head:  Normocephalic, without obvious abnormality, atraumatic  Eyes:  Conjunctiva clear, EOM's intact  Ears EACs normal bilaterally  Nose: Nares normal, normal mucosa, no visible anterior polyps, and septum midline  Throat: Lips, tongue normal; teeth and gums normal, erythematous posterior oropharynx ,  Neck: Supple, symmetrical  Lungs:   clear to auscultation bilaterally, Respirations unlabored, no coughing  Heart:  regular rate and rhythm and no murmur, Appears well perfused  Extremities: No edema  Skin: Skin color, texture, turgor normal and no rashes or lesions on visualized portions of skin   Neurologic: No gross  deficits   Labs:  Lab Orders  No laboratory test(s) ordered today   Spirometry:  Tracings reviewed. Her effort: Good reproducible efforts. FVC: 3.73L FEV1: 3.23L, 106% predicted FEV1/FVC ratio: 87% Interpretation: Spirometry consistent with normal pattern.  Please see scanned spirometry results for details.    Assessment/Plan   Patient Instructions  Asthma, severe persistent, well controlled  Breathing test showed: looked good  Continue Breztri  160mcg 2 puffs twice daily with spacer.   -rinse mouth after use  For increased symptoms or URI add on symbicort  160mcg 2 puffs twice daily for 1-2 weeks  Continue montelukast  10 mg once a day to prevent cough or wheeze Continue Combivent   or symbicort  2 puffs every 4 hours as needed for cough or wheeze OR Instead use duoneb solution via nebulizer one unit vial every 4 hours as needed for cough or wheeze Continue tezspire  injections every 4 weeks  Get annual influenza vaccine   Allergic rhinitis Continue carbinoxamine  4-8 mg  three times a day  Continue Flonase  2.5 mg once a day for stuffy nose Continue ipratropium 2 sprays each nostril twice a day as needed Consider saline nasal rinses as needed for nasal symptoms. Use this before any medicated nasal sprays for best result Consider updating your environmental allergy testing when you are breathing has returned to baseline  Reflux Continue dietary and lifestyle modifications as listed below Continue omeprazole  to 20mg  twice daily We will refer you to GI - Dr. Glendia Holt for reflux evaluations   Follow up: 6 months   Thank you so much for letting me partake in your care today.  Don't hesitate to reach out if you have any additional concerns!  Hargis Springer, MD  [facility] [place], [place]   Other: none    Thank you so much for letting me partake in your care today.  Don't hesitate to reach out if you have any additional  concerns!  Hargis Springer, MD  [facility] [place], [place]

## 2024-06-11 ENCOUNTER — Other Ambulatory Visit: Payer: Self-pay | Admitting: Internal Medicine

## 2024-06-14 ENCOUNTER — Other Ambulatory Visit: Payer: Self-pay | Admitting: Internal Medicine

## 2024-06-14 MED ORDER — ALBUTEROL SULFATE (2.5 MG/3ML) 0.083% IN NEBU: 2.5000 mg | INHALATION_SOLUTION | RESPIRATORY_TRACT | 1 refills | Status: AC | PRN

## 2024-06-14 MED ORDER — FLUTICASONE PROPIONATE 50 MCG/ACT NA SUSP: 2.0000 | Freq: Every day | NASAL | 0 refills | Status: AC

## 2024-06-15 DIAGNOSIS — M79645 Pain in left finger(s): Secondary | ICD-10-CM | POA: Diagnosis not present

## 2024-06-27 ENCOUNTER — Telehealth: Admitting: Family

## 2024-06-27 DIAGNOSIS — R399 Unspecified symptoms and signs involving the genitourinary system: Secondary | ICD-10-CM | POA: Diagnosis not present

## 2024-06-27 MED ORDER — SULFAMETHOXAZOLE-TRIMETHOPRIM 800-160 MG PO TABS: 1.0000 | ORAL_TABLET | Freq: Two times a day (BID) | ORAL | 0 refills | Status: DC

## 2024-06-27 NOTE — Progress Notes (Signed)
Approximately 5 minutes was spent documenting and reviewing patient's chart.

## 2024-06-27 NOTE — Progress Notes (Signed)
E-Visit for Urinary Problems ? ?We are sorry that you are not feeling well.  Here is how we plan to help! ? ?Based on what you shared with me it looks like you most likely have a simple urinary tract infection. ? ?A UTI (Urinary Tract Infection) is a bacterial infection of the bladder. ? ?Most cases of urinary tract infections are simple to treat but a key part of your care is to encourage you to drink plenty of fluids and watch your symptoms carefully. ? ?I have prescribed Bactrim DS One tablet twice a day for 7 days.  Your symptoms should gradually improve. Call us if the burning in your urine worsens, you develop worsening fever, back pain or pelvic pain or if your symptoms do not resolve after completing the antibiotic. ? ?Urinary tract infections can be prevented by drinking plenty of water to keep your body hydrated.  Also be sure when you wipe, wipe from front to back and don't hold it in!  If possible, empty your bladder every 4 hours. ? ?HOME CARE ?Drink plenty of fluids ?Compete the full course of the antibiotics even if the symptoms resolve ?Remember, when you need to go?go. Holding in your urine can increase the likelihood of getting a UTI! ?GET HELP RIGHT AWAY IF: ?You cannot urinate ?You get a high fever ?Worsening back pain occurs ?You see blood in your urine ?You feel sick to your stomach or throw up ?You feel like you are going to pass out ? ?MAKE SURE YOU  ?Understand these instructions. ?Will watch your condition. ?Will get help right away if you are not doing well or get worse. ? ? ?Thank you for choosing an e-visit. ? ?Your e-visit answers were reviewed by a board certified advanced clinical practitioner to complete your personal care plan. Depending upon the condition, your plan could have included both over the counter or prescription medications. ? ?Please review your pharmacy choice. Make sure the pharmacy is open so you can pick up prescription now. If there is a problem, you may contact  your provider through MyChart messaging and have the prescription routed to another pharmacy.  Your safety is important to us. If you have drug allergies check your prescription carefully.  ? ?For the next 24 hours you can use MyChart to ask questions about today's visit, request a non-urgent call back, or ask for a work or school excuse. ?You will get an email in the next two days asking about your experience. I hope that your e-visit has been valuable and will speed your recovery.  ?

## 2024-07-01 DIAGNOSIS — M79645 Pain in left finger(s): Secondary | ICD-10-CM | POA: Diagnosis not present

## 2024-07-05 DIAGNOSIS — M79645 Pain in left finger(s): Secondary | ICD-10-CM | POA: Diagnosis not present

## 2024-07-12 DIAGNOSIS — M79645 Pain in left finger(s): Secondary | ICD-10-CM | POA: Diagnosis not present

## 2024-07-14 ENCOUNTER — Other Ambulatory Visit: Payer: Self-pay

## 2024-07-14 ENCOUNTER — Other Ambulatory Visit (HOSPITAL_COMMUNITY): Payer: Self-pay

## 2024-07-22 DIAGNOSIS — M79645 Pain in left finger(s): Secondary | ICD-10-CM | POA: Diagnosis not present

## 2024-08-03 ENCOUNTER — Telehealth: Admitting: Family Medicine

## 2024-08-03 DIAGNOSIS — J019 Acute sinusitis, unspecified: Secondary | ICD-10-CM

## 2024-08-03 DIAGNOSIS — B9689 Other specified bacterial agents as the cause of diseases classified elsewhere: Secondary | ICD-10-CM

## 2024-08-03 MED ORDER — AMOXICILLIN-POT CLAVULANATE 875-125 MG PO TABS: 1.0000 | ORAL_TABLET | Freq: Two times a day (BID) | ORAL | 0 refills | Status: AC

## 2024-08-03 NOTE — Progress Notes (Signed)
 E-Visit for Sinus Problems  We are sorry that you are not feeling well.  Here is how we plan to help!  Based on what you have shared with me it looks like you have sinusitis.  Sinusitis is inflammation and infection in the sinus cavities of the head.  Based on your presentation I believe you most likely have Acute Bacterial Sinusitis.  This is an infection caused by bacteria and is treated with antibiotics. I have prescribed Augmentin  875mg /125mg  one tablet twice daily with food, for 7 days. You may use an oral decongestant such as Mucinex D or if you have glaucoma or high blood pressure use plain Mucinex. Saline nasal spray help and can safely be used as often as needed for congestion.  If you develop worsening sinus pain, fever or notice severe headache and vision changes, or if symptoms are not better after completion of antibiotic, please schedule an appointment with a health care provider.    Sinus infections are not as easily transmitted as other respiratory infection, however we still recommend that you avoid close contact with loved ones, especially the very young and elderly.  Remember to wash your hands thoroughly throughout the day as this is the number one way to prevent the spread of infection!  Home Care: Only take medications as instructed by your medical team. Complete the entire course of an antibiotic. Do not take these medications with alcohol. A steam or ultrasonic humidifier can help congestion.  You can place a towel over your head and breathe in the steam from hot water coming from a faucet. Avoid close contacts especially the very young and the elderly. Cover your mouth when you cough or sneeze. Always remember to wash your hands.  Get Help Right Away If: You develop worsening fever or sinus pain. You develop a severe head ache or visual changes. Your symptoms persist after you have completed your treatment plan.  Make sure you Understand these instructions. Will  watch your condition. Will get help right away if you are not doing well or get worse.  Your e-visit answers were reviewed by a board certified advanced clinical practitioner to complete your personal care plan.  Depending on the condition, your plan could have included both over the counter or prescription medications.  If there is a problem please reply  once you have received a response from your provider.  Your safety is important to us .  If you have drug allergies check your prescription carefully.    You can use MyChart to ask questions about today's visit, request a non-urgent call back, or ask for a work or school excuse for 24 hours related to this e-Visit. If it has been greater than 24 hours you will need to follow up with your provider, or enter a new e-Visit to address those concerns.  You will get an e-mail in the next two days asking about your experience.  I hope that your e-visit has been valuable and will speed your recovery. Thank you for using e-visits.  I have spent 5 minutes in review of e-visit questionnaire, review and updating patient chart, medical decision making and response to patient.   Chiquita CHRISTELLA Barefoot, NP

## 2024-08-05 DIAGNOSIS — M79645 Pain in left finger(s): Secondary | ICD-10-CM | POA: Diagnosis not present

## 2024-08-06 ENCOUNTER — Other Ambulatory Visit: Payer: Self-pay | Admitting: Internal Medicine

## 2024-08-06 DIAGNOSIS — J45909 Unspecified asthma, uncomplicated: Secondary | ICD-10-CM | POA: Diagnosis not present

## 2024-08-06 DIAGNOSIS — J209 Acute bronchitis, unspecified: Secondary | ICD-10-CM | POA: Diagnosis not present

## 2024-08-10 DIAGNOSIS — M79645 Pain in left finger(s): Secondary | ICD-10-CM | POA: Diagnosis not present

## 2024-08-17 DIAGNOSIS — R933 Abnormal findings on diagnostic imaging of other parts of digestive tract: Secondary | ICD-10-CM | POA: Diagnosis not present

## 2024-08-17 DIAGNOSIS — K219 Gastro-esophageal reflux disease without esophagitis: Secondary | ICD-10-CM | POA: Diagnosis not present

## 2024-08-17 DIAGNOSIS — R1319 Other dysphagia: Secondary | ICD-10-CM | POA: Diagnosis not present

## 2024-08-17 DIAGNOSIS — J302 Other seasonal allergic rhinitis: Secondary | ICD-10-CM | POA: Diagnosis not present

## 2024-08-19 ENCOUNTER — Encounter: Payer: Self-pay | Admitting: Family Medicine

## 2024-08-19 DIAGNOSIS — M9901 Segmental and somatic dysfunction of cervical region: Secondary | ICD-10-CM | POA: Diagnosis not present

## 2024-08-19 DIAGNOSIS — M9902 Segmental and somatic dysfunction of thoracic region: Secondary | ICD-10-CM | POA: Diagnosis not present

## 2024-08-19 DIAGNOSIS — M9903 Segmental and somatic dysfunction of lumbar region: Secondary | ICD-10-CM | POA: Diagnosis not present

## 2024-08-19 DIAGNOSIS — M9905 Segmental and somatic dysfunction of pelvic region: Secondary | ICD-10-CM | POA: Diagnosis not present

## 2024-08-20 DIAGNOSIS — M79645 Pain in left finger(s): Secondary | ICD-10-CM | POA: Diagnosis not present

## 2024-08-22 NOTE — Telephone Encounter (Signed)
 Can we please get her in for 1st 40 or 1st 11:20 appt available. Thanks.

## 2024-08-23 DIAGNOSIS — M9901 Segmental and somatic dysfunction of cervical region: Secondary | ICD-10-CM | POA: Diagnosis not present

## 2024-08-23 DIAGNOSIS — M9903 Segmental and somatic dysfunction of lumbar region: Secondary | ICD-10-CM | POA: Diagnosis not present

## 2024-08-23 DIAGNOSIS — M9905 Segmental and somatic dysfunction of pelvic region: Secondary | ICD-10-CM | POA: Diagnosis not present

## 2024-08-23 DIAGNOSIS — M9902 Segmental and somatic dysfunction of thoracic region: Secondary | ICD-10-CM | POA: Diagnosis not present

## 2024-08-23 NOTE — Telephone Encounter (Signed)
 I can see her Friday at 1:40 if she can do that

## 2024-08-24 DIAGNOSIS — M9902 Segmental and somatic dysfunction of thoracic region: Secondary | ICD-10-CM | POA: Diagnosis not present

## 2024-08-24 DIAGNOSIS — M9901 Segmental and somatic dysfunction of cervical region: Secondary | ICD-10-CM | POA: Diagnosis not present

## 2024-08-24 DIAGNOSIS — M9903 Segmental and somatic dysfunction of lumbar region: Secondary | ICD-10-CM | POA: Diagnosis not present

## 2024-08-24 DIAGNOSIS — M9905 Segmental and somatic dysfunction of pelvic region: Secondary | ICD-10-CM | POA: Diagnosis not present

## 2024-08-27 ENCOUNTER — Encounter: Payer: Self-pay | Admitting: Family Medicine

## 2024-08-27 ENCOUNTER — Other Ambulatory Visit: Payer: Self-pay | Admitting: Internal Medicine

## 2024-08-27 ENCOUNTER — Ambulatory Visit: Admitting: Family Medicine

## 2024-08-27 VITALS — BP 103/64 | HR 92 | Temp 98.4°F | Resp 15 | Ht 64.02 in | Wt 170.0 lb

## 2024-08-27 DIAGNOSIS — M9901 Segmental and somatic dysfunction of cervical region: Secondary | ICD-10-CM

## 2024-08-27 DIAGNOSIS — J3089 Other allergic rhinitis: Secondary | ICD-10-CM

## 2024-08-27 DIAGNOSIS — M9900 Segmental and somatic dysfunction of head region: Secondary | ICD-10-CM

## 2024-08-27 DIAGNOSIS — M9908 Segmental and somatic dysfunction of rib cage: Secondary | ICD-10-CM

## 2024-08-27 DIAGNOSIS — M9903 Segmental and somatic dysfunction of lumbar region: Secondary | ICD-10-CM

## 2024-08-27 DIAGNOSIS — J4520 Mild intermittent asthma, uncomplicated: Secondary | ICD-10-CM | POA: Diagnosis not present

## 2024-08-27 DIAGNOSIS — M9904 Segmental and somatic dysfunction of sacral region: Secondary | ICD-10-CM

## 2024-08-27 DIAGNOSIS — M542 Cervicalgia: Secondary | ICD-10-CM

## 2024-08-27 DIAGNOSIS — M9909 Segmental and somatic dysfunction of abdomen and other regions: Secondary | ICD-10-CM | POA: Diagnosis not present

## 2024-08-27 DIAGNOSIS — M9902 Segmental and somatic dysfunction of thoracic region: Secondary | ICD-10-CM | POA: Diagnosis not present

## 2024-08-27 DIAGNOSIS — M9905 Segmental and somatic dysfunction of pelvic region: Secondary | ICD-10-CM

## 2024-08-30 DIAGNOSIS — M9902 Segmental and somatic dysfunction of thoracic region: Secondary | ICD-10-CM | POA: Diagnosis not present

## 2024-08-30 DIAGNOSIS — M9901 Segmental and somatic dysfunction of cervical region: Secondary | ICD-10-CM | POA: Diagnosis not present

## 2024-08-30 DIAGNOSIS — M9903 Segmental and somatic dysfunction of lumbar region: Secondary | ICD-10-CM | POA: Diagnosis not present

## 2024-08-30 DIAGNOSIS — M9905 Segmental and somatic dysfunction of pelvic region: Secondary | ICD-10-CM | POA: Diagnosis not present

## 2024-08-31 DIAGNOSIS — M79645 Pain in left finger(s): Secondary | ICD-10-CM | POA: Diagnosis not present

## 2024-09-03 DIAGNOSIS — K317 Polyp of stomach and duodenum: Secondary | ICD-10-CM | POA: Diagnosis not present

## 2024-09-03 DIAGNOSIS — R1314 Dysphagia, pharyngoesophageal phase: Secondary | ICD-10-CM | POA: Diagnosis not present

## 2024-09-03 DIAGNOSIS — K219 Gastro-esophageal reflux disease without esophagitis: Secondary | ICD-10-CM | POA: Diagnosis not present

## 2024-09-03 DIAGNOSIS — R933 Abnormal findings on diagnostic imaging of other parts of digestive tract: Secondary | ICD-10-CM | POA: Diagnosis not present

## 2024-09-03 DIAGNOSIS — R1319 Other dysphagia: Secondary | ICD-10-CM | POA: Diagnosis not present

## 2024-09-05 ENCOUNTER — Encounter: Payer: Self-pay | Admitting: Family Medicine

## 2024-09-05 NOTE — Progress Notes (Signed)
 BP 103/64 (BP Location: Left Arm, Patient Position: Sitting, Cuff Size: Normal)   Pulse 92   Temp 98.4 F (36.9 C) (Oral)   Resp 15   Ht 5' 4.02 (1.626 m)   Wt 170 lb (77.1 kg)   LMP 08/02/2024 (Approximate)   SpO2 98%   Breastfeeding No   BMI 29.17 kg/m    Subjective:    Patient ID: Karla Huber, female    DOB: Sep 07, 1986, 38 y.o.   MRN: [id number]  HPI: Karla Huber is a 38 y.o. female  Chief Complaint  Patient presents with   Neck Pain   Mele presents today for evaluation and possible treatment with OMM for neck pain. She's not sure what caused it. It's sharp and aching in nature. It's in her upper back and into her neck and head. Pain is better with stretching and OMM, worse with certain positions. It radiates into her head. She is otherwise doing well. She is concerned about her allergies and is questioning whether she might have MCAS. She would like to see another allergist. No other concerns or complaints at this time.   Relevant past medical, surgical, family and social history reviewed and updated as indicated. Interim medical history since our last visit reviewed. Allergies and medications reviewed and updated.  Review of Systems  Constitutional: Negative.   Cardiovascular: Negative.   Musculoskeletal:  Positive for myalgias, neck pain and neck stiffness. Negative for arthralgias, gait problem and joint swelling.  Skin: Negative.   Neurological: Negative.   Psychiatric/Behavioral: Negative.      Per HPI unless specifically indicated above     Objective:    BP 103/64 (BP Location: Left Arm, Patient Position: Sitting, Cuff Size: Normal)   Pulse 92   Temp 98.4 F (36.9 C) (Oral)   Resp 15   Ht 5' 4.02 (1.626 m)   Wt 170 lb (77.1 kg)   LMP 08/02/2024 (Approximate)   SpO2 98%   Breastfeeding No   BMI 29.17 kg/m   Wt Readings from Last 3 Encounters:  08/27/24 170 lb (77.1 kg)  02/18/24 170 lb (77.1 kg)  01/28/24 170 lb 9.6 oz (77.4 kg)     Physical Exam Vitals and nursing note reviewed.  Constitutional:      General: She is not in acute distress.    Appearance: Normal appearance. She is not ill-appearing.  HENT:     Head: Normocephalic and atraumatic.     Right Ear: External ear normal.     Left Ear: External ear normal.     Nose: Nose normal.     Mouth/Throat:     Mouth: Mucous membranes are moist.     Pharynx: Oropharynx is clear.  Eyes:     Extraocular Movements: Extraocular movements intact.     Conjunctiva/sclera: Conjunctivae normal.     Pupils: Pupils are equal, round, and reactive to light.  Neck:     Vascular: No carotid bruit.  Cardiovascular:     Rate and Rhythm: Normal rate.     Pulses: Normal pulses.  Pulmonary:     Effort: Pulmonary effort is normal. No respiratory distress.  Abdominal:     General: Abdomen is flat. There is no distension.     Palpations: Abdomen is soft. There is no mass.     Tenderness: There is no abdominal tenderness. There is no right CVA tenderness, left CVA tenderness, guarding or rebound.     Hernia: No hernia is present.  Musculoskeletal:     Cervical back:  No muscular tenderness.  Lymphadenopathy:     Cervical: No cervical adenopathy.  Skin:    General: Skin is warm and dry.     Capillary Refill: Capillary refill takes less than 2 seconds.     Coloration: Skin is not jaundiced or pale.     Findings: No bruising, erythema, lesion or rash.  Neurological:     General: No focal deficit present.     Mental Status: She is alert. Mental status is at baseline.  Psychiatric:        Mood and Affect: Mood normal.        Behavior: Behavior normal.        Thought Content: Thought content normal.        Judgment: Judgment normal.   Musculoskeletal:  Exam found Decreased ROM, Tissue texture changes, Tenderness to palpation, and Asymmetry of patient's  head, neck, thorax, ribs, lumbar, pelvis, sacrum, and abdomen Osteopathic Structural Exam:   Head: OAESSR, hypertonic  suboccipital muscles  Neck: C3ESRR, C4ESRL, hypertonic trap on the R, hypertonic SCM on the R  Thorax: T2-4SLRR  Ribs: 1st rib elevated on the R, Ribs 5-6 locked up on the R, Rib 4 locked up on the L  Lumbar: QL hypertonic on the R, L3-5SLRR  Pelvis: Anterior R innominate.   Sacrum: R on R torsion  Abdomen: diaphragm hypertonic bilaterally R>L   Results for orders placed or performed in visit on 01/28/24  Bayer DCA Hb A1c Waived   Collection Time: 01/28/24  9:01 AM  Result Value Ref Range   HB A1C (BAYER DCA - WAIVED) 4.8 4.8 - 5.6 %  CBC with Differential/Platelet   Collection Time: 01/28/24  9:02 AM  Result Value Ref Range   WBC 9.8 3.4 - 10.8 x10E3/uL   RBC 3.92 3.77 - 5.28 x10E6/uL   Hemoglobin 12.7 11.1 - 15.9 g/dL   Hematocrit 61.8 65.9 - 46.6 %   MCV 97 79 - 97 fL   MCH 32.4 26.6 - 33.0 pg   MCHC 33.3 31.5 - 35.7 g/dL   RDW 87.5 88.2 - 84.5 %   Platelets 331 150 - 450 x10E3/uL   Neutrophils 61 Not Estab. %   Lymphs 30 Not Estab. %   Monocytes 7 Not Estab. %   Eos 1 Not Estab. %   Basos 0 Not Estab. %   Neutrophils Absolute 6.0 1.4 - 7.0 x10E3/uL   Lymphocytes Absolute 3.0 0.7 - 3.1 x10E3/uL   Monocytes Absolute 0.7 0.1 - 0.9 x10E3/uL   EOS (ABSOLUTE) 0.1 0.0 - 0.4 x10E3/uL   Basophils Absolute 0.0 0.0 - 0.2 x10E3/uL   Immature Granulocytes 1 Not Estab. %   Immature Grans (Abs) 0.1 0.0 - 0.1 x10E3/uL  Comprehensive metabolic panel with GFR   Collection Time: 01/28/24  9:02 AM  Result Value Ref Range   Glucose 90 70 - 99 mg/dL   BUN 7 6 - 20 mg/dL   Creatinine, Ser 9.32 0.57 - 1.00 mg/dL   eGFR 884 >40 fO/fpw/8.26   BUN/Creatinine Ratio 10 9 - 23   Sodium 141 134 - 144 mmol/L   Potassium 3.2 (L) 3.5 - 5.2 mmol/L   Chloride 100 96 - 106 mmol/L   CO2 27 20 - 29 mmol/L   Calcium 9.2 8.7 - 10.2 mg/dL   Total Protein 6.4 6.0 - 8.5 g/dL   Albumin 4.2 3.9 - 4.9 g/dL   Globulin, Total 2.2 1.5 - 4.5 g/dL   Bilirubin Total 0.3 0.0 - 1.2 mg/dL  Alkaline Phosphatase  56 44 - 121 IU/L   AST 23 0 - 40 IU/L   ALT 23 0 - 32 IU/L  Lipid Panel w/o Chol/HDL Ratio   Collection Time: 01/28/24  9:02 AM  Result Value Ref Range   Cholesterol, Total 152 100 - 199 mg/dL   Triglycerides 849 (H) 0 - 149 mg/dL   HDL 69 >60 mg/dL   VLDL Cholesterol Cal 25 5 - 40 mg/dL   LDL Chol Calc (NIH) 58 0 - 99 mg/dL  TSH   Collection Time: 01/28/24  9:02 AM  Result Value Ref Range   TSH 1.920 0.450 - 4.500 uIU/mL  Iron  Binding Cap (TIBC)([facility])   Collection Time: 01/28/24  9:02 AM  Result Value Ref Range   Total Iron  Binding Capacity 287 250 - 450 ug/dL   UIBC 781 868 - 574 ug/dL   Iron  69 27 - 159 ug/dL   Iron  Saturation 24 15 - 55 %  Ferritin   Collection Time: 01/28/24  9:02 AM  Result Value Ref Range   Ferritin 168 (H) 15 - 150 ng/mL      Assessment & Plan:   Problem List Items Addressed This Visit       Respiratory   Other allergic rhinitis   Referral to allergy placed today.       Relevant Orders   Ambulatory referral to Allergy   Asthma   Referral to allergy placed today.       Relevant Orders   Ambulatory referral to Allergy   Other Visit Diagnoses       Neck pain    -  Primary   She does have somatic dysfunction that is contributing to her symptoms. Treated today with good results as below. Call with any concerns.     Thoracic segment dysfunction         Somatic dysfunction of pelvis region         Cervical segment dysfunction         Head region somatic dysfunction         Rib cage region somatic dysfunction         Somatic dysfunction of lumbar region         Somatic dysfunction of sacral region         Segmental dysfunction of abdomen           After verbal consent was obtained, patient was treated today with osteopathic manipulative medicine to the regions of the head, neck, thorax, ribs, lumbar, pelvis, sacrum, and abdomen using the techniques of cranial, myofascial release, counterstrain, muscle energy, HVLA, and soft  tissue. Areas of compensation relating to her primary pain source also treated. Patient tolerated the procedure well with good objective and good subjective improvement in symptoms. She left the room in good condition. She was advised to stay well hydrated and that she may have some soreness following the procedure. If not improving or worsening, she will call and come in. She will return for reevaluation  on a PRN basis.  Follow up plan: Return if symptoms worsen or fail to improve.

## 2024-09-05 NOTE — Assessment & Plan Note (Signed)
Referral to allergy placed today.

## 2024-09-06 ENCOUNTER — Encounter: Payer: Self-pay | Admitting: Family Medicine

## 2024-09-06 ENCOUNTER — Other Ambulatory Visit: Payer: Self-pay | Admitting: Internal Medicine

## 2024-09-06 DIAGNOSIS — M79645 Pain in left finger(s): Secondary | ICD-10-CM | POA: Diagnosis not present

## 2024-09-07 ENCOUNTER — Ambulatory Visit: Admitting: Family Medicine

## 2024-09-07 DIAGNOSIS — M9905 Segmental and somatic dysfunction of pelvic region: Secondary | ICD-10-CM | POA: Diagnosis not present

## 2024-09-07 DIAGNOSIS — M9902 Segmental and somatic dysfunction of thoracic region: Secondary | ICD-10-CM | POA: Diagnosis not present

## 2024-09-07 DIAGNOSIS — M9901 Segmental and somatic dysfunction of cervical region: Secondary | ICD-10-CM | POA: Diagnosis not present

## 2024-09-07 DIAGNOSIS — M9903 Segmental and somatic dysfunction of lumbar region: Secondary | ICD-10-CM | POA: Diagnosis not present

## 2024-09-09 ENCOUNTER — Ambulatory Visit: Admitting: Family Medicine

## 2024-09-09 DIAGNOSIS — N926 Irregular menstruation, unspecified: Secondary | ICD-10-CM | POA: Diagnosis not present

## 2024-09-09 DIAGNOSIS — B9689 Other specified bacterial agents as the cause of diseases classified elsewhere: Secondary | ICD-10-CM | POA: Diagnosis not present

## 2024-09-09 DIAGNOSIS — N760 Acute vaginitis: Secondary | ICD-10-CM | POA: Diagnosis not present

## 2024-09-16 ENCOUNTER — Encounter: Payer: Self-pay | Admitting: Family Medicine

## 2024-09-16 ENCOUNTER — Other Ambulatory Visit: Payer: Self-pay | Admitting: Family Medicine

## 2024-09-16 DIAGNOSIS — J4520 Mild intermittent asthma, uncomplicated: Secondary | ICD-10-CM

## 2024-09-16 DIAGNOSIS — M9901 Segmental and somatic dysfunction of cervical region: Secondary | ICD-10-CM | POA: Diagnosis not present

## 2024-09-16 DIAGNOSIS — M9902 Segmental and somatic dysfunction of thoracic region: Secondary | ICD-10-CM | POA: Diagnosis not present

## 2024-09-16 DIAGNOSIS — M9903 Segmental and somatic dysfunction of lumbar region: Secondary | ICD-10-CM | POA: Diagnosis not present

## 2024-09-16 DIAGNOSIS — J3089 Other allergic rhinitis: Secondary | ICD-10-CM

## 2024-09-16 DIAGNOSIS — M79645 Pain in left finger(s): Secondary | ICD-10-CM | POA: Diagnosis not present

## 2024-09-16 DIAGNOSIS — M9905 Segmental and somatic dysfunction of pelvic region: Secondary | ICD-10-CM | POA: Diagnosis not present

## 2024-09-20 ENCOUNTER — Encounter: Payer: Self-pay | Admitting: Family Medicine

## 2024-09-20 ENCOUNTER — Ambulatory Visit (INDEPENDENT_AMBULATORY_CARE_PROVIDER_SITE_OTHER): Admitting: Family Medicine

## 2024-09-20 VITALS — BP 112/71 | HR 89 | Temp 97.4°F | Ht 64.0 in | Wt 164.0 lb

## 2024-09-20 DIAGNOSIS — M9908 Segmental and somatic dysfunction of rib cage: Secondary | ICD-10-CM | POA: Diagnosis not present

## 2024-09-20 DIAGNOSIS — M9901 Segmental and somatic dysfunction of cervical region: Secondary | ICD-10-CM

## 2024-09-20 DIAGNOSIS — M9905 Segmental and somatic dysfunction of pelvic region: Secondary | ICD-10-CM

## 2024-09-20 DIAGNOSIS — M9900 Segmental and somatic dysfunction of head region: Secondary | ICD-10-CM | POA: Diagnosis not present

## 2024-09-20 DIAGNOSIS — M542 Cervicalgia: Secondary | ICD-10-CM | POA: Diagnosis not present

## 2024-09-20 DIAGNOSIS — M9902 Segmental and somatic dysfunction of thoracic region: Secondary | ICD-10-CM

## 2024-09-20 DIAGNOSIS — M9903 Segmental and somatic dysfunction of lumbar region: Secondary | ICD-10-CM

## 2024-09-20 DIAGNOSIS — M9909 Segmental and somatic dysfunction of abdomen and other regions: Secondary | ICD-10-CM | POA: Diagnosis not present

## 2024-09-20 DIAGNOSIS — M9904 Segmental and somatic dysfunction of sacral region: Secondary | ICD-10-CM

## 2024-09-20 NOTE — Progress Notes (Signed)
 BP 112/71   Pulse 89   Temp (!) 97.4 F (36.3 C) (Oral)   Ht 5' 4 (1.626 m)   Wt 164 lb (74.4 kg)   LMP 08/02/2024 (Approximate)   SpO2 98%   BMI 28.15 kg/m    Subjective:    Patient ID: Karla Huber, female    DOB: [birth year], 38 y.o.   MRN: [id number]  HPI: Karla Huber is a 38 y.o. female  Chief Complaint  Patient presents with   Neck Pain   Karla Huber presents today for evaluation and possible treatment with OMT. She notes that her neck is doing OK. She was really tight. Had seen a massage therapist. Feeling a bit better, but not 100%. Se notes that her pelvis and hips are hurting. They started last night. She's not sure what she did. They are aching and sore. Nothing has made them better, but she is worse with certain movements- they feel really tight. She is otherwise feeling OK. No other concerns or complaints at this time.   Relevant past medical, surgical, family and social history reviewed and updated as indicated. Interim medical history since our last visit reviewed. Allergies and medications reviewed and updated.  Review of Systems  Constitutional: Negative.   Respiratory: Negative.    Cardiovascular: Negative.   Musculoskeletal:  Positive for arthralgias, back pain, myalgias, neck pain and neck stiffness. Negative for gait problem and joint swelling.  Skin: Negative.   Psychiatric/Behavioral: Negative.      Per HPI unless specifically indicated above     Objective:    BP 112/71   Pulse 89   Temp (!) 97.4 F (36.3 C) (Oral)   Ht 5' 4 (1.626 m)   Wt 164 lb (74.4 kg)   LMP 08/02/2024 (Approximate)   SpO2 98%   BMI 28.15 kg/m   Wt Readings from Last 3 Encounters:  09/20/24 164 lb (74.4 kg)  08/27/24 170 lb (77.1 kg)  02/18/24 170 lb (77.1 kg)    Physical Exam Vitals and nursing note reviewed.  Constitutional:      General: She is not in acute distress.    Appearance: Normal appearance. She is not ill-appearing.  HENT:     Head:  Normocephalic and atraumatic.     Right Ear: External ear normal.     Left Ear: External ear normal.     Nose: Nose normal.     Mouth/Throat:     Mouth: Mucous membranes are moist.     Pharynx: Oropharynx is clear.  Eyes:     Extraocular Movements: Extraocular movements intact.     Conjunctiva/sclera: Conjunctivae normal.     Pupils: Pupils are equal, round, and reactive to light.  Neck:     Vascular: No carotid bruit.  Cardiovascular:     Rate and Rhythm: Normal rate.     Pulses: Normal pulses.  Pulmonary:     Effort: Pulmonary effort is normal. No respiratory distress.  Abdominal:     General: Abdomen is flat. There is no distension.     Palpations: Abdomen is soft. There is no mass.     Tenderness: There is no abdominal tenderness. There is no right CVA tenderness, left CVA tenderness, guarding or rebound.     Hernia: No hernia is present.  Musculoskeletal:     Cervical back: No muscular tenderness.  Lymphadenopathy:     Cervical: No cervical adenopathy.  Skin:    General: Skin is warm and dry.     Capillary Refill: Capillary refill  takes less than 2 seconds.     Coloration: Skin is not jaundiced or pale.     Findings: No bruising, erythema, lesion or rash.  Neurological:     General: No focal deficit present.     Mental Status: She is alert. Mental status is at baseline.  Psychiatric:        Mood and Affect: Mood normal.        Behavior: Behavior normal.        Thought Content: Thought content normal.        Judgment: Judgment normal.    Musculoskeletal:  Exam found Decreased ROM, Tissue texture changes, Tenderness to palpation, and Asymmetry of patient's  head, neck, thorax, ribs, lumbar, pelvis, sacrum, and abdomen Osteopathic Structural Exam:   Head: OAESSL, hypertonic suboccipital muscles  Neck: C4ESRR, SCM hypertonic on the L, Trap hypertonic on the L  Thorax: T6 ESRR  Ribs: Ribs 5-8 locked up on the L, Rib 5 locked up on the R  Lumbar: QL hypertonic on the L,  psoas hypertonic on the L  Pelvis: Posterior innominate on the L  Sacrum: L on L torsion  Abdomen: diaphragm hypertonic bilaterally L>R  Results for orders placed or performed in visit on 01/28/24  Bayer DCA Hb A1c Waived   Collection Time: 01/28/24  9:01 AM  Result Value Ref Range   HB A1C (BAYER DCA - WAIVED) 4.8 4.8 - 5.6 %  CBC with Differential/Platelet   Collection Time: 01/28/24  9:02 AM  Result Value Ref Range   WBC 9.8 3.4 - 10.8 x10E3/uL   RBC 3.92 3.77 - 5.28 x10E6/uL   Hemoglobin 12.7 11.1 - 15.9 g/dL   Hematocrit 61.8 65.9 - 46.6 %   MCV 97 79 - 97 fL   MCH 32.4 26.6 - 33.0 pg   MCHC 33.3 31.5 - 35.7 g/dL   RDW 87.5 88.2 - 84.5 %   Platelets 331 150 - 450 x10E3/uL   Neutrophils 61 Not Estab. %   Lymphs 30 Not Estab. %   Monocytes 7 Not Estab. %   Eos 1 Not Estab. %   Basos 0 Not Estab. %   Neutrophils Absolute 6.0 1.4 - 7.0 x10E3/uL   Lymphocytes Absolute 3.0 0.7 - 3.1 x10E3/uL   Monocytes Absolute 0.7 0.1 - 0.9 x10E3/uL   EOS (ABSOLUTE) 0.1 0.0 - 0.4 x10E3/uL   Basophils Absolute 0.0 0.0 - 0.2 x10E3/uL   Immature Granulocytes 1 Not Estab. %   Immature Grans (Abs) 0.1 0.0 - 0.1 x10E3/uL  Comprehensive metabolic panel with GFR   Collection Time: 01/28/24  9:02 AM  Result Value Ref Range   Glucose 90 70 - 99 mg/dL   BUN 7 6 - 20 mg/dL   Creatinine, Ser 9.32 0.57 - 1.00 mg/dL   eGFR 884 >40 fO/fpw/8.26   BUN/Creatinine Ratio 10 9 - 23   Sodium 141 134 - 144 mmol/L   Potassium 3.2 (L) 3.5 - 5.2 mmol/L   Chloride 100 96 - 106 mmol/L   CO2 27 20 - 29 mmol/L   Calcium 9.2 8.7 - 10.2 mg/dL   Total Protein 6.4 6.0 - 8.5 g/dL   Albumin 4.2 3.9 - 4.9 g/dL   Globulin, Total 2.2 1.5 - 4.5 g/dL   Bilirubin Total 0.3 0.0 - 1.2 mg/dL   Alkaline Phosphatase 56 44 - 121 IU/L   AST 23 0 - 40 IU/L   ALT 23 0 - 32 IU/L  Lipid Panel w/o Chol/HDL Ratio   Collection  Time: 01/28/24  9:02 AM  Result Value Ref Range   Cholesterol, Total 152 100 - 199 mg/dL   Triglycerides  849 (H) 0 - 149 mg/dL   HDL 69 >60 mg/dL   VLDL Cholesterol Cal 25 5 - 40 mg/dL   LDL Chol Calc (NIH) 58 0 - 99 mg/dL  TSH   Collection Time: 01/28/24  9:02 AM  Result Value Ref Range   TSH 1.920 0.450 - 4.500 uIU/mL  Iron  Binding Cap (TIBC)([facility])   Collection Time: 01/28/24  9:02 AM  Result Value Ref Range   Total Iron  Binding Capacity 287 250 - 450 ug/dL   UIBC 781 868 - 574 ug/dL   Iron  69 27 - 159 ug/dL   Iron  Saturation 24 15 - 55 %  Ferritin   Collection Time: 01/28/24  9:02 AM  Result Value Ref Range   Ferritin 168 (H) 15 - 150 ng/mL      Assessment & Plan:   Problem List Items Addressed This Visit   None Visit Diagnoses       Neck pain    -  Primary   She does have somatic dysfunction that is contributing to her symptoms. Treated today with good results as below. Call with any concerns. Continue to monitor.     Thoracic segment dysfunction         Head region somatic dysfunction         Somatic dysfunction of sacral region         Somatic dysfunction of pelvis region         Rib cage region somatic dysfunction         Segmental dysfunction of abdomen         Cervical segment dysfunction         Somatic dysfunction of lumbar region          After verbal consent was obtained, patient was treated today with osteopathic manipulative medicine to the regions of the head, neck, thorax, ribs, lumbar, pelvis, sacrum, and abdomen using the techniques of cranial, myofascial release, counterstrain, muscle energy, HVLA, and soft tissue. Areas of compensation relating to her primary pain source also treated. Patient tolerated the procedure well with good objective and good subjective improvement in symptoms. She left the room in good condition. She was advised to stay well hydrated and that she may have some soreness following the procedure. If not improving or worsening, she will call and come in. She will return for reevaluation  on a PRN basis.   Follow up  plan: Return if symptoms worsen or fail to improve.

## 2024-09-27 DIAGNOSIS — M9902 Segmental and somatic dysfunction of thoracic region: Secondary | ICD-10-CM | POA: Diagnosis not present

## 2024-09-27 DIAGNOSIS — M9905 Segmental and somatic dysfunction of pelvic region: Secondary | ICD-10-CM | POA: Diagnosis not present

## 2024-09-27 DIAGNOSIS — M9903 Segmental and somatic dysfunction of lumbar region: Secondary | ICD-10-CM | POA: Diagnosis not present

## 2024-09-27 DIAGNOSIS — M9901 Segmental and somatic dysfunction of cervical region: Secondary | ICD-10-CM | POA: Diagnosis not present

## 2024-09-30 DIAGNOSIS — N920 Excessive and frequent menstruation with regular cycle: Secondary | ICD-10-CM | POA: Diagnosis not present

## 2024-10-01 DIAGNOSIS — M79645 Pain in left finger(s): Secondary | ICD-10-CM | POA: Diagnosis not present

## 2024-10-06 ENCOUNTER — Encounter: Payer: Self-pay | Admitting: Family Medicine

## 2024-10-07 NOTE — Telephone Encounter (Signed)
 Done

## 2024-10-11 DIAGNOSIS — M79645 Pain in left finger(s): Secondary | ICD-10-CM | POA: Diagnosis not present

## 2024-10-12 ENCOUNTER — Ambulatory Visit: Admitting: Family Medicine

## 2024-10-19 ENCOUNTER — Encounter: Payer: Self-pay | Admitting: Family Medicine

## 2024-10-19 ENCOUNTER — Ambulatory Visit: Admitting: Family Medicine

## 2024-10-19 VITALS — BP 122/73 | HR 101 | Temp 98.2°F | Ht 64.0 in | Wt 159.8 lb

## 2024-10-19 DIAGNOSIS — M9905 Segmental and somatic dysfunction of pelvic region: Secondary | ICD-10-CM | POA: Diagnosis not present

## 2024-10-19 DIAGNOSIS — M9909 Segmental and somatic dysfunction of abdomen and other regions: Secondary | ICD-10-CM | POA: Diagnosis not present

## 2024-10-19 DIAGNOSIS — M25552 Pain in left hip: Secondary | ICD-10-CM | POA: Diagnosis not present

## 2024-10-19 DIAGNOSIS — M9908 Segmental and somatic dysfunction of rib cage: Secondary | ICD-10-CM

## 2024-10-19 DIAGNOSIS — M9900 Segmental and somatic dysfunction of head region: Secondary | ICD-10-CM | POA: Diagnosis not present

## 2024-10-19 DIAGNOSIS — M9902 Segmental and somatic dysfunction of thoracic region: Secondary | ICD-10-CM | POA: Diagnosis not present

## 2024-10-19 DIAGNOSIS — M9901 Segmental and somatic dysfunction of cervical region: Secondary | ICD-10-CM | POA: Diagnosis not present

## 2024-10-19 DIAGNOSIS — M9903 Segmental and somatic dysfunction of lumbar region: Secondary | ICD-10-CM

## 2024-10-19 DIAGNOSIS — Z20828 Contact with and (suspected) exposure to other viral communicable diseases: Secondary | ICD-10-CM

## 2024-10-19 DIAGNOSIS — M9904 Segmental and somatic dysfunction of sacral region: Secondary | ICD-10-CM | POA: Diagnosis not present

## 2024-10-19 MED ORDER — OSELTAMIVIR PHOSPHATE 75 MG PO CAPS: 75.0000 mg | ORAL_CAPSULE | Freq: Every day | ORAL | 0 refills | Status: AC

## 2024-10-19 NOTE — Progress Notes (Signed)
 "  BP 122/73   Pulse (!) 101   Temp 98.2 F (36.8 C) (Oral)   Ht 5' 4 (1.626 m)   Wt 159 lb 12.8 oz (72.5 kg)   SpO2 98%   BMI 27.43 kg/m    Subjective:    Patient ID: Karla Huber, female    DOB: [birth year], 38 y.o.   MRN: [id number]  HPI: Karla Huber is a 38 y.o. female  Chief Complaint  Patient presents with   Hip Pain   Barry presents today for evaluation and possible treatment with OMT for L hip pain. She notes that she had a massage today and it helped. She notes that her L hip is angry. It has been aching and tight. Better with OMT. Worse with certain movements. Pain is radiating into her low back. Her daughter has been sick and tested positive for the flu today. No other concerns or complaints at this time.     Relevant past medical, surgical, family and social history reviewed and updated as indicated. Interim medical history since our last visit reviewed. Allergies and medications reviewed and updated.  Review of Systems  Constitutional: Negative.   Respiratory: Negative.    Cardiovascular: Negative.   Musculoskeletal:  Positive for arthralgias and myalgias. Negative for back pain, gait problem, joint swelling, neck pain and neck stiffness.  Skin: Negative.   Neurological: Negative.   Psychiatric/Behavioral: Negative.  Negative for agitation.     Per HPI unless specifically indicated above     Objective:    BP 122/73   Pulse (!) 101   Temp 98.2 F (36.8 C) (Oral)   Ht 5' 4 (1.626 m)   Wt 159 lb 12.8 oz (72.5 kg)   SpO2 98%   BMI 27.43 kg/m   Wt Readings from Last 3 Encounters:  10/19/24 159 lb 12.8 oz (72.5 kg)  09/20/24 164 lb (74.4 kg)  08/27/24 170 lb (77.1 kg)    Physical Exam Vitals and nursing note reviewed.  Constitutional:      General: She is not in acute distress.    Appearance: Normal appearance. She is not ill-appearing.  HENT:     Head: Normocephalic and atraumatic.     Right Ear: External ear normal.     Left Ear:  External ear normal.     Nose: Nose normal.     Mouth/Throat:     Mouth: Mucous membranes are moist.     Pharynx: Oropharynx is clear.  Eyes:     Extraocular Movements: Extraocular movements intact.     Conjunctiva/sclera: Conjunctivae normal.     Pupils: Pupils are equal, round, and reactive to light.  Neck:     Vascular: No carotid bruit.  Cardiovascular:     Rate and Rhythm: Normal rate.     Pulses: Normal pulses.  Pulmonary:     Effort: Pulmonary effort is normal. No respiratory distress.  Abdominal:     General: Abdomen is flat. There is no distension.     Palpations: Abdomen is soft. There is no mass.     Tenderness: There is no abdominal tenderness. There is no right CVA tenderness, left CVA tenderness, guarding or rebound.     Hernia: No hernia is present.  Musculoskeletal:     Cervical back: No muscular tenderness.  Lymphadenopathy:     Cervical: No cervical adenopathy.  Skin:    General: Skin is warm and dry.     Capillary Refill: Capillary refill takes less than 2 seconds.  Coloration: Skin is not jaundiced or pale.     Findings: No bruising, erythema, lesion or rash.  Neurological:     General: No focal deficit present.     Mental Status: She is alert. Mental status is at baseline.  Psychiatric:        Mood and Affect: Mood normal.        Behavior: Behavior normal.        Thought Content: Thought content normal.        Judgment: Judgment normal.   Musculoskeletal:  Exam found Decreased ROM, Tissue texture changes, Tenderness to palpation, and Asymmetry of patient's  head, neck, thorax, ribs, lumbar, pelvis, sacrum, and abdomen Osteopathic Structural Exam:   Head: hypertonic suboccipital muscles, OAESSR  Neck: C3ESRL, C4ESRR  Thorax: T5-7ESRR  Ribs: Ribs 6-8 locked up on the L, Rib 7 locked up on the R  Lumbar: QL hypertonic on the L  Pelvis: Posterior L innominate  Sacrum: L on L torsion  Abdomen: diaphragm hypertonic bilaterally L>R   Results for  orders placed or performed in visit on 01/28/24  Bayer DCA Hb A1c Waived   Collection Time: 01/28/24  9:01 AM  Result Value Ref Range   HB A1C (BAYER DCA - WAIVED) 4.8 4.8 - 5.6 %  CBC with Differential/Platelet   Collection Time: 01/28/24  9:02 AM  Result Value Ref Range   WBC 9.8 3.4 - 10.8 x10E3/uL   RBC 3.92 3.77 - 5.28 x10E6/uL   Hemoglobin 12.7 11.1 - 15.9 g/dL   Hematocrit 61.8 65.9 - 46.6 %   MCV 97 79 - 97 fL   MCH 32.4 26.6 - 33.0 pg   MCHC 33.3 31.5 - 35.7 g/dL   RDW 87.5 88.2 - 84.5 %   Platelets 331 150 - 450 x10E3/uL   Neutrophils 61 Not Estab. %   Lymphs 30 Not Estab. %   Monocytes 7 Not Estab. %   Eos 1 Not Estab. %   Basos 0 Not Estab. %   Neutrophils Absolute 6.0 1.4 - 7.0 x10E3/uL   Lymphocytes Absolute 3.0 0.7 - 3.1 x10E3/uL   Monocytes Absolute 0.7 0.1 - 0.9 x10E3/uL   EOS (ABSOLUTE) 0.1 0.0 - 0.4 x10E3/uL   Basophils Absolute 0.0 0.0 - 0.2 x10E3/uL   Immature Granulocytes 1 Not Estab. %   Immature Grans (Abs) 0.1 0.0 - 0.1 x10E3/uL  Comprehensive metabolic panel with GFR   Collection Time: 01/28/24  9:02 AM  Result Value Ref Range   Glucose 90 70 - 99 mg/dL   BUN 7 6 - 20 mg/dL   Creatinine, Ser 9.32 0.57 - 1.00 mg/dL   eGFR 884 >40 fO/fpw/8.26   BUN/Creatinine Ratio 10 9 - 23   Sodium 141 134 - 144 mmol/L   Potassium 3.2 (L) 3.5 - 5.2 mmol/L   Chloride 100 96 - 106 mmol/L   CO2 27 20 - 29 mmol/L   Calcium 9.2 8.7 - 10.2 mg/dL   Total Protein 6.4 6.0 - 8.5 g/dL   Albumin 4.2 3.9 - 4.9 g/dL   Globulin, Total 2.2 1.5 - 4.5 g/dL   Bilirubin Total 0.3 0.0 - 1.2 mg/dL   Alkaline Phosphatase 56 44 - 121 IU/L   AST 23 0 - 40 IU/L   ALT 23 0 - 32 IU/L  Lipid Panel w/o Chol/HDL Ratio   Collection Time: 01/28/24  9:02 AM  Result Value Ref Range   Cholesterol, Total 152 100 - 199 mg/dL   Triglycerides 849 (H) 0 -  149 mg/dL   HDL 69 >60 mg/dL   VLDL Cholesterol Cal 25 5 - 40 mg/dL   LDL Chol Calc (NIH) 58 0 - 99 mg/dL  TSH   Collection Time:  01/28/24  9:02 AM  Result Value Ref Range   TSH 1.920 0.450 - 4.500 uIU/mL  Iron  Binding Cap (TIBC)([facility])   Collection Time: 01/28/24  9:02 AM  Result Value Ref Range   Total Iron  Binding Capacity 287 250 - 450 ug/dL   UIBC 781 868 - 574 ug/dL   Iron  69 27 - 159 ug/dL   Iron  Saturation 24 15 - 55 %  Ferritin   Collection Time: 01/28/24  9:02 AM  Result Value Ref Range   Ferritin 168 (H) 15 - 150 ng/mL      Assessment & Plan:   Problem List Items Addressed This Visit   None Visit Diagnoses       Pain of left hip    -  Primary   She does have somatic dysfunction that is contributing to her symptoms. Treated today with good results as below. Call with any concerns.     Exposure to the flu       Will treat with tamiflu  prophylactically. Call with any concerns.     Somatic dysfunction of sacral region         Segmental dysfunction of abdomen         Thoracic segment dysfunction         Somatic dysfunction of pelvis region         Cervical segment dysfunction         Head region somatic dysfunction         Rib cage region somatic dysfunction         Somatic dysfunction of lumbar region          After verbal consent was obtained, patient was treated today with osteopathic manipulative medicine to the regions of the head, neck, thorax, ribs, lumbar, pelvis, sacrum, and abdomen using the techniques of cranial, myofascial release, counterstrain, muscle energy, HVLA, and soft tissue. Areas of compensation relating to her primary pain source also treated. Patient tolerated the procedure well with good objective and good subjective improvement in symptoms. She left the room in good condition. She was advised to stay well hydrated and that she may have some soreness following the procedure. If not improving or worsening, she will call and come in. She will return for reevaluation  on a PRN basis.   Follow up plan: Return if symptoms worsen or fail to improve.      "

## 2024-10-25 DIAGNOSIS — M79645 Pain in left finger(s): Secondary | ICD-10-CM | POA: Diagnosis not present

## 2024-10-27 ENCOUNTER — Other Ambulatory Visit: Payer: Self-pay | Admitting: Internal Medicine

## 2024-11-19 ENCOUNTER — Telehealth: Admitting: Physician Assistant

## 2024-11-19 DIAGNOSIS — J019 Acute sinusitis, unspecified: Secondary | ICD-10-CM

## 2024-11-19 DIAGNOSIS — B9689 Other specified bacterial agents as the cause of diseases classified elsewhere: Secondary | ICD-10-CM

## 2024-11-19 MED ORDER — DOXYCYCLINE HYCLATE 100 MG PO TABS: 100.0000 mg | ORAL_TABLET | Freq: Two times a day (BID) | ORAL | 0 refills | Status: AC

## 2024-11-19 NOTE — Progress Notes (Signed)

## 2024-12-07 ENCOUNTER — Ambulatory Visit: Admitting: Internal Medicine

## 2024-12-14 ENCOUNTER — Encounter

## 2024-12-16 ENCOUNTER — Encounter

## 2024-12-31 ENCOUNTER — Ambulatory Visit: Admitting: Family Medicine

## 2025-02-07 ENCOUNTER — Encounter: Admitting: Family Medicine

## 2025-02-08 ENCOUNTER — Encounter: Admitting: Family Medicine
